# Patient Record
Sex: Female | Born: 1995 | Race: Black or African American | Hispanic: No | Marital: Single | Smoking: Never smoker
Health system: Southern US, Community
[De-identification: ages and names within clinical notes are randomized; demographics above are authoritative.]

## PROBLEM LIST (undated history)

## (undated) ENCOUNTER — Ambulatory Visit (HOSPITAL_COMMUNITY): Discharge status: Absent without leave

## (undated) DIAGNOSIS — B009 Herpesviral infection, unspecified: Secondary | ICD-10-CM

---
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
## 2017-03-17 DIAGNOSIS — N898 Other specified noninflammatory disorders of vagina: Secondary | ICD-10-CM | POA: Diagnosis not present

## 2017-08-05 DIAGNOSIS — N912 Amenorrhea, unspecified: Secondary | ICD-10-CM | POA: Diagnosis not present

## 2017-08-05 DIAGNOSIS — R1084 Generalized abdominal pain: Secondary | ICD-10-CM | POA: Diagnosis not present

## 2017-08-11 DIAGNOSIS — Z0489 Encounter for examination and observation for other specified reasons: Secondary | ICD-10-CM | POA: Diagnosis not present

## 2017-08-11 DIAGNOSIS — A048 Other specified bacterial intestinal infections: Secondary | ICD-10-CM | POA: Diagnosis not present

## 2017-08-11 DIAGNOSIS — R1084 Generalized abdominal pain: Secondary | ICD-10-CM | POA: Diagnosis not present

## 2017-08-12 ENCOUNTER — Other Ambulatory Visit: Payer: Self-pay

## 2017-08-12 ENCOUNTER — Encounter (HOSPITAL_COMMUNITY): Payer: Self-pay | Admitting: Emergency Medicine

## 2017-08-12 DIAGNOSIS — Z5321 Procedure and treatment not carried out due to patient leaving prior to being seen by health care provider: Secondary | ICD-10-CM | POA: Insufficient documentation

## 2017-08-12 DIAGNOSIS — K5900 Constipation, unspecified: Secondary | ICD-10-CM | POA: Insufficient documentation

## 2017-08-12 DIAGNOSIS — R109 Unspecified abdominal pain: Secondary | ICD-10-CM | POA: Insufficient documentation

## 2017-08-12 LAB — CBC
HEMATOCRIT: 34.8 % — AB (ref 36.0–46.0)
HEMOGLOBIN: 11.8 g/dL — AB (ref 12.0–15.0)
MCH: 30.4 pg (ref 26.0–34.0)
MCHC: 33.9 g/dL (ref 30.0–36.0)
MCV: 89.7 fL (ref 78.0–100.0)
Platelets: 302 10*3/uL (ref 150–400)
RBC: 3.88 MIL/uL (ref 3.87–5.11)
RDW: 12 % (ref 11.5–15.5)
WBC: 6.8 10*3/uL (ref 4.0–10.5)

## 2017-08-12 LAB — URINALYSIS, ROUTINE W REFLEX MICROSCOPIC
BACTERIA UA: NONE SEEN
Bilirubin Urine: NEGATIVE
GLUCOSE, UA: NEGATIVE mg/dL
Hgb urine dipstick: NEGATIVE
KETONES UR: NEGATIVE mg/dL
Nitrite: NEGATIVE
PROTEIN: NEGATIVE mg/dL
Specific Gravity, Urine: 1.028 (ref 1.005–1.030)
pH: 5 (ref 5.0–8.0)

## 2017-08-12 LAB — COMPREHENSIVE METABOLIC PANEL
ALBUMIN: 3.2 g/dL — AB (ref 3.5–5.0)
ALT: 41 U/L (ref 14–54)
AST: 25 U/L (ref 15–41)
Alkaline Phosphatase: 98 U/L (ref 38–126)
Anion gap: 10 (ref 5–15)
BUN: 5 mg/dL — ABNORMAL LOW (ref 6–20)
CHLORIDE: 105 mmol/L (ref 101–111)
CO2: 24 mmol/L (ref 22–32)
Calcium: 8.8 mg/dL — ABNORMAL LOW (ref 8.9–10.3)
Creatinine, Ser: 0.75 mg/dL (ref 0.44–1.00)
GFR calc Af Amer: >60 mL/min (ref >60)
GFR calc non Af Amer: >60 mL/min (ref >60)
GLUCOSE: 105 mg/dL — AB (ref 65–99)
POTASSIUM: 3.6 mmol/L (ref 3.5–5.1)
SODIUM: 139 mmol/L (ref 135–145)
Total Bilirubin: 0.3 mg/dL (ref 0.3–1.2)
Total Protein: 6.9 g/dL (ref 6.5–8.1)

## 2017-08-12 LAB — LIPASE, BLOOD: LIPASE: 28 U/L (ref 11–51)

## 2017-08-12 LAB — I-STAT BETA HCG BLOOD, ED (MC, WL, AP ONLY)

## 2017-08-12 NOTE — ED Triage Notes (Signed)
Reports having abdominal pain for the last few days.  Seen at [facility].  Was told she had a lot of gas and constipation.   Seen there Thursday.  Was told they would send scripts for "chest pain".  Patient denies having chest pain.  Reports having a bm this morning that was liquid.  First bm was 2 days ago.

## 2017-08-13 ENCOUNTER — Emergency Department (HOSPITAL_COMMUNITY)
Admission: EM | Admit: 2017-08-13 | Discharge: 2017-08-13 | Discharge status: Against Medical Advice | Payer: Self-pay | Attending: Emergency Medicine | Admitting: Emergency Medicine

## 2017-08-13 NOTE — ED Notes (Signed)
Unable to locate pt in waiting room.

## 2017-08-13 NOTE — ED Notes (Signed)
No answer in waiting room 

## 2017-09-24 ENCOUNTER — Ambulatory Visit (HOSPITAL_COMMUNITY)
Admission: EM | Admit: 2017-09-24 | Discharge: 2017-09-24 | Disposition: A | Discharge status: Home / Self Care | Payer: 59 | Attending: Family Medicine | Admitting: Family Medicine

## 2017-09-24 ENCOUNTER — Encounter (HOSPITAL_COMMUNITY): Payer: Self-pay | Admitting: Family Medicine

## 2017-09-24 DIAGNOSIS — R0981 Nasal congestion: Secondary | ICD-10-CM | POA: Diagnosis present

## 2017-09-24 DIAGNOSIS — R69 Illness, unspecified: Secondary | ICD-10-CM

## 2017-09-24 DIAGNOSIS — R05 Cough: Secondary | ICD-10-CM | POA: Diagnosis present

## 2017-09-24 DIAGNOSIS — J111 Influenza due to unidentified influenza virus with other respiratory manifestations: Secondary | ICD-10-CM | POA: Diagnosis not present

## 2017-09-24 LAB — POCT RAPID STREP A: Streptococcus, Group A Screen (Direct): NEGATIVE

## 2017-09-24 MED ORDER — ONDANSETRON 4 MG PO TBDP: 4.0000 mg | ORAL_TABLET | Freq: Three times a day (TID) | ORAL | 0 refills | Status: DC | PRN

## 2017-09-24 MED ORDER — PSEUDOEPH-BROMPHEN-DM 30-2-10 MG/5ML PO SYRP: 5.0000 mL | ORAL_SOLUTION | Freq: Four times a day (QID) | ORAL | 0 refills | Status: DC | PRN

## 2017-09-24 MED ORDER — CETIRIZINE HCL 10 MG PO CAPS: 10.0000 mg | ORAL_CAPSULE | Freq: Every day | ORAL | 0 refills | Status: DC

## 2017-09-24 MED ORDER — FLUTICASONE PROPIONATE 50 MCG/ACT NA SUSP: 1.0000 | Freq: Every day | NASAL | 0 refills | Status: DC

## 2017-09-24 NOTE — ED Triage Notes (Signed)
Pt here for cough, congestion, fever, and body aches since yesterday.

## 2017-09-24 NOTE — Discharge Instructions (Addendum)
For congestion please begin daily Zyrtec, Flonase nasal spray.  I have sent these in, but please compare to over-the-counter prices.   For cough you may continue Robitussin, over-the-counter Delsym or the cough syrup provided.  For sore throat you may use Cepacol lozenges, salt water gargles, Chloraseptic spray, honey and tea  He may use Zofran as needed for any nausea or vomiting.  Continue Tylenol and ibuprofen for fever, headache.  I expect symptoms to gradually resolve over the next week

## 2017-09-24 NOTE — ED Provider Notes (Signed)
[facility]    CSN: [id number] Arrival date & time: 09/24/17  1306     History   Chief Complaint Chief Complaint  Patient presents with  . Cough  . Nasal Congestion    HPI Jane Martin is a 22 y.o. female Patient is presenting with URI symptoms- congestion, cough, sore throat.  Patient also noting subjective fevers, body aches and weakness, headaches for 2 days.  Patient's main complaints are cough. Symptoms have been going on for 2 days. Patient has tried ibuprofen, Mucinex, Robitussin, with minimal relief. Denies fever, nausea, vomiting, diarrhea. Denies shortness of breath and chest pain.  Decreased appetite, tolerating oral intake.  Believes she had a flu exposure at work.   HPI  History reviewed. No pertinent past medical history.  There are no active problems to display for this patient.   History reviewed. No pertinent surgical history.  OB History   None      Home Medications    Prior to Admission medications   Medication Sig Start Date End Date Taking? Authorizing Provider  brompheniramine-pseudoephedrine-DM 30-2-10 MG/5ML syrup Take 5 mLs by mouth 4 (four) times daily as needed. 09/24/17   Mayuri Staples C, PA-C  Cetirizine HCl 10 MG CAPS Take 1 capsule (10 mg total) by mouth daily for 10 days. 09/24/17 10/04/17  Shernell Saldierna C, PA-C  fluticasone (FLONASE) 50 MCG/ACT nasal spray Place 1-2 sprays into both nostrils daily. 09/24/17   Rc Amison C, PA-C  ondansetron (ZOFRAN ODT) 4 MG disintegrating tablet Take 1 tablet (4 mg total) by mouth every 8 (eight) hours as needed for nausea or vomiting. 09/24/17   Letti Towell, Junius Creamer, PA-C    Family History History reviewed. No pertinent family history.  Social History Social History   Tobacco Use  . Smoking status: Never Smoker  . Smokeless tobacco: Never Used  Substance Use Topics  . Alcohol use: No    Frequency: Never  . Drug use: Yes    Types: Marijuana     Allergies   Patient has no  known allergies.   Review of Systems Review of Systems  Constitutional: Positive for appetite change, chills and fever. Negative for fatigue.  HENT: Positive for congestion, rhinorrhea and sore throat. Negative for ear pain, sinus pressure and trouble swallowing.   Respiratory: Positive for cough. Negative for chest tightness and shortness of breath.   Cardiovascular: Negative for chest pain.  Gastrointestinal: Negative for abdominal pain, nausea and vomiting.  Musculoskeletal: Positive for myalgias.  Skin: Negative for rash.  Neurological: Positive for weakness and headaches. Negative for dizziness and light-headedness.     Physical Exam Triage Vital Signs ED Triage Vitals  Enc Vitals Group     BP 09/24/17 1349 117/76     Pulse Rate 09/24/17 1349 97     Resp 09/24/17 1349 18     Temp 09/24/17 1349 98.5 F (36.9 C)     Temp Source 09/24/17 1350 Oral     SpO2 09/24/17 1349 97 %     Weight --      Height --      Head Circumference --      Peak Flow --      Pain Score 09/24/17 1347 4     Pain Loc --      Pain Edu? --      Excl. in GC? --    No data found.  Updated Vital Signs BP 117/76   Pulse 97   Temp 99.1 F (37.3  C) (Oral)   Resp 18   LMP 09/06/2017   SpO2 97%   Visual Acuity Right Eye Distance:   Left Eye Distance:   Bilateral Distance:    Right Eye Near:   Left Eye Near:    Bilateral Near:     Physical Exam  Constitutional: She appears well-developed and well-nourished. No distress.  HENT:  Head: Normocephalic and atraumatic.  Bilateral TMs not erythematous, nasal mucosa erythematous with swollen turbinates, clear rhinorrhea present.  Posterior oropharynx mildly erythematous, no tonsillar enlargement or exudate.  Eyes: Conjunctivae are normal.  Neck: Neck supple.  Cardiovascular: Normal rate and regular rhythm.  No murmur heard. Pulmonary/Chest: Effort normal and breath sounds normal. No respiratory distress.  Breathing comfortably at rest, CTA BL,  no adventitious sounds appreciated  Abdominal: Soft. There is no tenderness.  Musculoskeletal: She exhibits no edema.  Neurological: She is alert.  Skin: Skin is warm and dry.  Psychiatric: She has a normal mood and affect.  Nursing note and vitals reviewed.    UC Treatments / Results  Labs (all labs ordered are listed, but only abnormal results are displayed) Labs Reviewed  CULTURE, GROUP A STREP [facility])  POCT RAPID STREP A    EKG None Radiology No results found.  Procedures Procedures (including critical care time)  Medications Ordered in UC Medications - No data to display   Initial Impression / Assessment and Plan / UC Course  I have reviewed the triage vital signs and the nursing notes.  Pertinent labs & imaging results that were available during my care of the patient were reviewed by me and considered in my medical decision making (see chart for details).     Patient with viral URI versus influenza-like illness.  Will recommend symptomatic treatment.  Vital signs stable, patient is nontoxic and in no acute distress. Discussed strict return precautions. Patient verbalized understanding and is agreeable with plan.   Final Clinical Impressions(s) / UC Diagnoses   Final diagnoses:  Influenza-like illness    ED Discharge Orders        Ordered    Cetirizine HCl 10 MG CAPS  Daily     09/24/17 1416    fluticasone (FLONASE) 50 MCG/ACT nasal spray  Daily     09/24/17 1416    brompheniramine-pseudoephedrine-DM 30-2-10 MG/5ML syrup  4 times daily PRN     09/24/17 1416    ondansetron (ZOFRAN ODT) 4 MG disintegrating tablet  Every 8 hours PRN     09/24/17 1418       Controlled Substance Prescriptions [place] Controlled Substance Registry consulted? Not Applicable   Lew DawesWieters, Zeba Luby C, [place]PA-C 09/24/17 1538

## 2017-09-27 LAB — CULTURE, GROUP A STREP (THRC)

## 2018-02-03 DIAGNOSIS — Z113 Encounter for screening for infections with a predominantly sexual mode of transmission: Secondary | ICD-10-CM | POA: Diagnosis not present

## 2018-02-03 DIAGNOSIS — Z6825 Body mass index (BMI) 25.0-25.9, adult: Secondary | ICD-10-CM | POA: Diagnosis not present

## 2018-02-03 DIAGNOSIS — Z118 Encounter for screening for other infectious and parasitic diseases: Secondary | ICD-10-CM | POA: Diagnosis not present

## 2018-02-03 DIAGNOSIS — Z114 Encounter for screening for human immunodeficiency virus [HIV]: Secondary | ICD-10-CM | POA: Diagnosis not present

## 2018-02-03 DIAGNOSIS — N760 Acute vaginitis: Secondary | ICD-10-CM | POA: Diagnosis not present

## 2018-02-03 DIAGNOSIS — Z1159 Encounter for screening for other viral diseases: Secondary | ICD-10-CM | POA: Diagnosis not present

## 2018-02-03 DIAGNOSIS — Z01419 Encounter for gynecological examination (general) (routine) without abnormal findings: Secondary | ICD-10-CM | POA: Diagnosis not present

## 2018-03-15 DIAGNOSIS — N898 Other specified noninflammatory disorders of vagina: Secondary | ICD-10-CM | POA: Diagnosis not present

## 2018-03-15 DIAGNOSIS — Z202 Contact with and (suspected) exposure to infections with a predominantly sexual mode of transmission: Secondary | ICD-10-CM | POA: Diagnosis not present

## 2018-03-15 DIAGNOSIS — N760 Acute vaginitis: Secondary | ICD-10-CM | POA: Diagnosis not present

## 2018-05-25 DIAGNOSIS — Z118 Encounter for screening for other infectious and parasitic diseases: Secondary | ICD-10-CM | POA: Diagnosis not present

## 2018-05-25 DIAGNOSIS — N760 Acute vaginitis: Secondary | ICD-10-CM | POA: Diagnosis not present

## 2018-08-04 DIAGNOSIS — N760 Acute vaginitis: Secondary | ICD-10-CM | POA: Diagnosis not present

## 2018-08-04 DIAGNOSIS — Z113 Encounter for screening for infections with a predominantly sexual mode of transmission: Secondary | ICD-10-CM | POA: Diagnosis not present

## 2018-08-04 DIAGNOSIS — Z118 Encounter for screening for other infectious and parasitic diseases: Secondary | ICD-10-CM | POA: Diagnosis not present

## 2018-08-04 DIAGNOSIS — Z202 Contact with and (suspected) exposure to infections with a predominantly sexual mode of transmission: Secondary | ICD-10-CM | POA: Diagnosis not present

## 2018-08-04 DIAGNOSIS — Z1159 Encounter for screening for other viral diseases: Secondary | ICD-10-CM | POA: Diagnosis not present

## 2018-08-04 DIAGNOSIS — Z114 Encounter for screening for human immunodeficiency virus [HIV]: Secondary | ICD-10-CM | POA: Diagnosis not present

## 2018-09-05 ENCOUNTER — Ambulatory Visit (HOSPITAL_COMMUNITY)
Admission: EM | Admit: 2018-09-05 | Discharge: 2018-09-05 | Disposition: A | Discharge status: Home / Self Care | Payer: 59 | Attending: Family Medicine | Admitting: Family Medicine

## 2018-09-05 ENCOUNTER — Ambulatory Visit: Payer: Self-pay | Admitting: Nurse Practitioner

## 2018-09-05 ENCOUNTER — Encounter (HOSPITAL_COMMUNITY): Payer: Self-pay | Admitting: Emergency Medicine

## 2018-09-05 VITALS — BP 100/65 | HR 90 | Temp 98.6°F | Resp 16 | Wt 155.2 lb

## 2018-09-05 DIAGNOSIS — R109 Unspecified abdominal pain: Secondary | ICD-10-CM

## 2018-09-05 DIAGNOSIS — K529 Noninfective gastroenteritis and colitis, unspecified: Secondary | ICD-10-CM | POA: Diagnosis not present

## 2018-09-05 DIAGNOSIS — R10819 Abdominal tenderness, unspecified site: Secondary | ICD-10-CM

## 2018-09-05 LAB — POCT URINALYSIS DIPSTICK
BILIRUBIN UA: NEGATIVE
GLUCOSE UA: NEGATIVE
Ketones, UA: NEGATIVE
Leukocytes, UA: NEGATIVE
Nitrite, UA: NEGATIVE
Protein, UA: NEGATIVE
RBC UA: NEGATIVE
SPEC GRAV UA: 1.01 (ref 1.010–1.025)
Urobilinogen, UA: 0.2 E.U./dL
pH, UA: 6 (ref 5.0–8.0)

## 2018-09-05 LAB — POCT URINE PREGNANCY: Preg Test, Ur: NEGATIVE

## 2018-09-05 MED ORDER — ONDANSETRON HCL 4 MG PO TABS: 4.0000 mg | ORAL_TABLET | Freq: Four times a day (QID) | ORAL | 0 refills | Status: DC

## 2018-09-05 NOTE — Progress Notes (Signed)
History of Present Illness   Patient Identification Jane Martin is a 23 y.o. female.  Chief Complaint  Nausea (X 1 DAY ); Diarrhea (X 1 DAY); Abdominal Cramping (STARTED TODAY ); and Headache (1 DAY )   Patient presents for evaluation of diarrhea. Onset of symptoms was 1 day ago, and has been unchanged since that time. Patient reports having stools 5-7 since the onset. The stool consistency is described as mucous-laden and watery; the color is brown, and the volume is alternating small and large.  Other symptoms include anorexia, nausea, vomiting 1 times over last 1 day. Emesis was described as digested food, headache.  Risk factors for diarrhea include known or suspected contact with infectious person (at the end of last week).  Previous evaluation includes none.  Treatment to date includes none. Other acquaintances affected - patient and none. There is no prior history of gastrointestinal disease and no known risk factors for parasitic or bacterial infection. Patient denies chance of pregnancy, her LMP was January 9 through 16, patient is on OCP.  No past medical history on file.    Current Outpatient Medications:  .  brompheniramine-pseudoephedrine-DM 30-2-10 MG/5ML syrup, Take 5 mLs by mouth 4 (four) times daily as needed. (Patient not taking: Reported on 09/05/2018), Disp: 120 mL, Rfl: 0 .  Cetirizine HCl 10 MG CAPS, Take 1 capsule (10 mg total) by mouth daily for 10 days., Disp: 10 capsule, Rfl: 0 .  fluticasone (FLONASE) 50 MCG/ACT nasal spray, Place 1-2 sprays into both nostrils daily. (Patient not taking: Reported on 09/05/2018), Disp: 1 g, Rfl: 0 .  ondansetron (ZOFRAN ODT) 4 MG disintegrating tablet, Take 1 tablet (4 mg total) by mouth every 8 (eight) hours as needed for nausea or vomiting. (Patient not taking: Reported on 09/05/2018), Disp: 20 tablet, Rfl: 0   Review of Systems Constitutional: positive for anorexia and fatigue, negative for chills, malaise, sweats and weight  loss Eyes: negative Ears, nose, mouth, throat, and face: positive for nasal congestion, negative for ear drainage, earaches and sore throat Respiratory: negative Cardiovascular: negative Gastrointestinal: positive for abdominal pain, diarrhea, nausea and vomiting, negative for constipation, odynophagia and reflux symptoms Neurological: positive for headaches, negative for coordination problems, dizziness, gait problems, paresthesia, tremors, vertigo and weakness   Physical Exam   BP 100/65 (BP Location: Right Arm, Patient Position: Sitting, Cuff Size: Normal)   Pulse 90   Temp 98.6 F (37 C) (Oral)   Resp 16   Wt 155 lb 3.2 oz (70.4 kg)   SpO2 97%   BMI 27.49 kg/m  Physical Exam Vitals signs reviewed.  Constitutional:      General: She is not in acute distress. HENT:     Head: Normocephalic.     Mouth/Throat:     Mouth: Mucous membranes are moist.  Neck:     Musculoskeletal: Normal range of motion and neck supple.  Cardiovascular:     Rate and Rhythm: Normal rate and regular rhythm.     Heart sounds: Normal heart sounds.  Pulmonary:     Effort: Pulmonary effort is normal. No respiratory distress.     Breath sounds: Normal breath sounds. No stridor. No wheezing, rhonchi or rales.  Abdominal:     General: Bowel sounds are normal.     Palpations: Abdomen is soft.     Tenderness: There is generalized abdominal tenderness (diffuse abdominal tenderness in all quadrants, increased in RLQ) and tenderness in the right upper quadrant. There is guarding and rebound. There is no  right CVA tenderness or left CVA tenderness.  Lymphadenopathy:     Cervical: No cervical adenopathy.  Skin:    General: Skin is warm and dry.     Capillary Refill: Capillary refill takes less than 2 seconds.  Neurological:     Mental Status: She is alert and oriented to person, place, and time.     Cranial Nerves: No cranial nerve deficit.     Motor: No weakness.     Coordination: Coordination normal.   Psychiatric:        Mood and Affect: Mood normal.        Speech: Speech normal.    Results for orders placed or performed in visit on 09/05/18 (from the past 24 hour(s))  POCT urinalysis dipstick     Status: Normal   Collection Time: 09/05/18 10:41 AM  Result Value Ref Range   Color, UA YELLOW    Clarity, UA CLEAR    Glucose, UA Negative Negative   Bilirubin, UA NEGATIVE    Ketones, UA NEGATIVE    Spec Grav, UA 1.010 1.010 - 1.025   Blood, UA NEGATIVE    pH, UA 6.0 5.0 - 8.0   Protein, UA Negative Negative   Urobilinogen, UA 0.2 0.2 or 1.0 E.U./dL   Nitrite, UA NEGATIVE    Leukocytes, UA Negative Negative   Appearance     Odor    POCT urine pregnancy     Status: Normal   Collection Time: 09/05/18 10:55 AM  Result Value Ref Range   Preg Test, Ur Negative Negative    Assessment and Plan:   Exam findings, diagnosis etiology and medication use and indications reviewed with patient. Follow- Up and discharge instructions provided.  Based on the patient's physical assessment, and the findings of abdominal tenderness, I am recommending this patient go to the local urgent care emergency department for further work-up to rule out other abdominal conditions such as appendicitis.  Patient verbalized understanding of information provided and agrees with plan of care (POC), all questions answered. The patient is advised to call or return to clinic if condition does not see an improvement in symptoms, or to seek the care of the closest emergency department if condition worsens with the above plan.   1. Abdominal cramps  - POCT urinalysis dipstick - POCT urine pregnancy  2. Abdominal tenderness, rebound tenderness presence not specified, unspecified location

## 2018-09-05 NOTE — Patient Instructions (Addendum)
Abdominal Pain, Adult -Due to your abdominal tenderness found on exam, I would like you to go a local urgent care or to the ER for further workup and evaluation.    Abdominal pain can be caused by many things. Often, abdominal pain is not serious and it gets better with no treatment or by being treated at home. However, sometimes abdominal pain is serious. Your health care provider will do a medical history and a physical exam to try to determine the cause of your abdominal pain. Follow these instructions at home:  Take over-the-counter and prescription medicines only as told by your health care provider. Do not take a laxative unless told by your health care provider.  Drink enough fluid to keep your urine clear or pale yellow.  Watch your condition for any changes.  Keep all follow-up visits as told by your health care provider. This is important. Contact a health care provider if:  Your abdominal pain changes or gets worse.  You are not hungry or you lose weight without trying.  You are constipated or have diarrhea for more than 2-3 days.  You have pain when you urinate or have a bowel movement.  Your abdominal pain wakes you up at night.  Your pain gets worse with meals, after eating, or with certain foods.  You are throwing up and cannot keep anything down.  You have a fever. Get help right away if:  Your pain does not go away as soon as your health care provider told you to expect.  You cannot stop throwing up.  Your pain is only in areas of the abdomen, such as the right side or the left lower portion of the abdomen.  You have bloody or black stools, or stools that look like tar.  You have severe pain, cramping, or bloating in your abdomen.  You have signs of dehydration, such as: ? Dark urine, very little urine, or no urine. ? Cracked lips. ? Dry mouth. ? Sunken eyes. ? Sleepiness. ? Weakness. This information is not intended to replace advice given to you by  your health care provider. Make sure you discuss any questions you have with your health care provider. Document Released: 03/31/2005 Document Revised: 01/09/2016 Document Reviewed: 12/03/2015 Elsevier Interactive Patient Education  2019 [employer].

## 2018-09-05 NOTE — Discharge Instructions (Addendum)
Drink lots of fluids Take Zofran for any nausea and vomiting Take Imodium if needed for diarrhea Advance to bland diet Avoid spicy food and fried foods for a good week or 2

## 2018-09-05 NOTE — ED Provider Notes (Signed)
[facility]    CSN: [id number] Arrival date & time: 09/05/18  1141     History   Chief Complaint Chief Complaint  Patient presents with  . Emesis  . Diarrhea    HPI Jane Martin is a 23 y.o. female.   HPI  Patient states that she was exposed to gastroenteritis at work.  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  This patient started having symptoms 2 days ago.  Nausea vomiting diarrhea and abdominal crampy pain.  Has not vomited since yesterday.  Still very nauseated.  Taking sips of fluids.  Still with lots of diarrhea.  No recent travel.  No recent antibiotics.  She does not eat any foods that she thinks but not agreed with her.  History reviewed. No pertinent past medical history.  There are no active problems to display for this patient.   History reviewed. No pertinent surgical history.  OB History   No obstetric history on file.      Home Medications    Prior to Admission medications   Medication Sig Start Date End Date Taking? Authorizing Provider  Cetirizine HCl 10 MG CAPS Take 1 capsule (10 mg total) by mouth daily for 10 days. 09/24/17 10/04/17  Wieters, Hallie C, PA-C  ondansetron (ZOFRAN) 4 MG tablet Take 1 tablet (4 mg total) by mouth every 6 (six) hours. 09/05/18   Eustace Moore, MD    Family History No family history on file.  Social History Social History   Tobacco Use  . Smoking status: Never Smoker  . Smokeless tobacco: Never Used  Substance Use Topics  . Alcohol use: No    Frequency: Never  . Drug use: Yes    Types: Marijuana     Allergies   Patient has no known allergies.   Review of Systems Review of Systems  Constitutional: Negative for chills and fever.  HENT: Negative for ear pain and sore throat.   Eyes: Negative for pain and visual disturbance.  Respiratory: Negative for cough and shortness of breath.   Cardiovascular: Negative for chest pain and palpitations.    Gastrointestinal: Positive for abdominal pain, diarrhea, nausea and vomiting.  Genitourinary: Negative for dysuria and hematuria.  Musculoskeletal: Negative for arthralgias and back pain.  Skin: Negative for color change and rash.  Neurological: Negative for seizures and syncope.  All other systems reviewed and are negative.    Physical Exam Triage Vital Signs ED Triage Vitals  Enc Vitals Group     BP 09/05/18 1229 117/71     Pulse Rate 09/05/18 1229 73     Resp 09/05/18 1229 18     Temp 09/05/18 1229 98.4 F (36.9 C)     Temp src --      SpO2 09/05/18 1229 100 %     Weight --      Height --      Head Circumference --      Peak Flow --      Pain Score 09/05/18 1231 0     Pain Loc --      Pain Edu? --      Excl. in GC? --    No data found.  Updated Vital Signs BP 117/71   Pulse 73   Temp 98.4 F (36.9 C)   Resp 18   SpO2 100%     Physical Exam Constitutional:      General: She is not in acute distress.    Appearance:  Normal appearance. She is well-developed and normal weight.     Comments: No acute distress  HENT:     Head: Normocephalic and atraumatic.     Right Ear: Tympanic membrane and ear canal normal.     Left Ear: Tympanic membrane and ear canal normal.     Mouth/Throat:     Mouth: Mucous membranes are moist.     Pharynx: No posterior oropharyngeal erythema.  Eyes:     Conjunctiva/sclera: Conjunctivae normal.     Pupils: Pupils are equal, round, and reactive to light.  Neck:     Musculoskeletal: Normal range of motion.  Cardiovascular:     Rate and Rhythm: Normal rate and regular rhythm.     Heart sounds: Normal heart sounds.  Pulmonary:     Effort: Pulmonary effort is normal. No respiratory distress.     Breath sounds: Normal breath sounds.  Abdominal:     General: Abdomen is flat. There is no distension.     Palpations: Abdomen is soft.     Tenderness: There is no abdominal tenderness.  Musculoskeletal: Normal range of motion.  Skin:     General: Skin is warm and dry.  Neurological:     General: No focal deficit present.     Mental Status: She is alert.  Psychiatric:        Mood and Affect: Mood normal.        Behavior: Behavior normal.      UC Treatments / Results  Labs (all labs ordered are listed, but only abnormal results are displayed) Labs Reviewed - No data to display  EKG None  Radiology No results found.  Procedures Procedures (including critical care time)  Medications Ordered in UC Medications - No data to display  Initial Impression / Assessment and Plan / UC Course  I have reviewed the triage vital signs and the nursing notes.  Pertinent labs & imaging results that were available during my care of the patient were reviewed by me and considered in my medical decision making (see chart for details).     Likely viral gastroenteritis.  Discussed recovery.  Advance to regular diet. Final Clinical Impressions(s) / UC Diagnoses   Final diagnoses:  Gastroenteritis     Discharge Instructions     Drink lots of fluids Take Zofran for any nausea and vomiting Take Imodium if needed for diarrhea Advance to bland diet Avoid spicy food and fried foods for a good week or 2   ED Prescriptions    Medication Sig Dispense Auth. Provider   ondansetron (ZOFRAN) 4 MG tablet Take 1 tablet (4 mg total) by mouth every 6 (six) hours. 12 tablet Eustace Moore, MD     Controlled Substance Prescriptions [place] Controlled Substance Registry consulted? Not Applicable   Eustace Moore, MD 09/05/18 [phone]

## 2018-09-05 NOTE — ED Triage Notes (Signed)
Pt states yesterday she was at work and she threw up, states she took a pregnancy test and it was negative. She states she went home from work and had diarrhea. States today shes still having diarrhea today. Pt also states shes congested with cough. No vomiting today.

## 2018-09-07 ENCOUNTER — Telehealth: Payer: Self-pay

## 2018-09-07 NOTE — Telephone Encounter (Signed)
Tried calling pt to see how she was feeling since her visit with [place], however there was no answer and pt has a voicemail that has not been setup.

## 2018-11-01 ENCOUNTER — Ambulatory Visit (HOSPITAL_COMMUNITY)
Admission: EM | Admit: 2018-11-01 | Discharge: 2018-11-01 | Disposition: A | Discharge status: Home / Self Care | Payer: PRIVATE HEALTH INSURANCE | Attending: Family Medicine | Admitting: Family Medicine

## 2018-11-01 ENCOUNTER — Encounter (HOSPITAL_COMMUNITY): Payer: Self-pay

## 2018-11-01 ENCOUNTER — Other Ambulatory Visit: Payer: Self-pay

## 2018-11-01 DIAGNOSIS — N898 Other specified noninflammatory disorders of vagina: Secondary | ICD-10-CM | POA: Diagnosis not present

## 2018-11-01 LAB — POCT URINALYSIS DIP (DEVICE)
Bilirubin Urine: NEGATIVE
Glucose, UA: NEGATIVE mg/dL
Hgb urine dipstick: NEGATIVE
Ketones, ur: NEGATIVE mg/dL
Nitrite: NEGATIVE
Protein, ur: NEGATIVE mg/dL
Specific Gravity, Urine: 1.025 (ref 1.005–1.030)
Urobilinogen, UA: 1 mg/dL (ref 0.0–1.0)
pH: 7 (ref 5.0–8.0)

## 2018-11-01 MED ORDER — FLUCONAZOLE 200 MG PO TABS: ORAL_TABLET | ORAL | 0 refills | Status: DC

## 2018-11-01 NOTE — ED Notes (Signed)
Patient verbalizes understanding of discharge instructions. Opportunity for questioning and answers were provided. Patient discharged from [facility] by RN.  

## 2018-11-01 NOTE — ED Triage Notes (Signed)
Patient presents to Urgent Care with complaints of  Vaginal itching and irritation since 3 days ago. Patient states she also has frequent and  Burning urination.

## 2018-11-01 NOTE — ED Provider Notes (Signed)
[facility]   [id number] 11/01/18 Arrival Time: 1615   HY:[id number] itching and irritation  SUBJECTIVE:  Jane Martin is a 23 y.o. female who presents with complaints of vaginal itching and irritation that began 1 day ago.  Does admit to a change in bath products, to scented products.  Also mentions last sexual activity 2 days ago.  Patient has been sexually active with 1 female partner.  She has NOT tried OTC medications or creams.  Also mentions increased urinary frequency and discomfort with urination.  She denies fever, chills, nausea, vomiting, abdominal or pelvic pain, vaginal discharge, vaginal odor, vaginal bleeding, dyspareunia, vaginal rashes or lesions.   No LMP recorded. Patient has had an implant. Current birth control method: Nexplanon  ROS: As per HPI.  History reviewed. No pertinent past medical history. History reviewed. No pertinent surgical history. No Known Allergies No current facility-administered medications on file prior to encounter.    No current outpatient medications on file prior to encounter.    Social History   Socioeconomic History  . Marital status: Single    Spouse name: Not on file  . Number of children: Not on file  . Years of education: Not on file  . Highest education level: Not on file  Occupational History  . Not on file  Social Needs  . Financial resource strain: Not on file  . Food insecurity:    Worry: Not on file    Inability: Not on file  . Transportation needs:    Medical: Not on file    Non-medical: Not on file  Tobacco Use  . Smoking status: Never Smoker  . Smokeless tobacco: Never Used  Substance and Sexual Activity  . Alcohol use: No    Frequency: Never  . Drug use: Yes    Types: Marijuana  . Sexual activity: Not on file  Lifestyle  . Physical activity:    Days per week: Not on file    Minutes per session: Not on file  . Stress: Not on file  Relationships  . Social connections:    Talks on phone:  Not on file    Gets together: Not on file    Attends religious service: Not on file    Active member of club or organization: Not on file    Attends meetings of clubs or organizations: Not on file    Relationship status: Not on file  . Intimate partner violence:    Fear of current or ex partner: Not on file    Emotionally abused: Not on file    Physically abused: Not on file    Forced sexual activity: Not on file  Other Topics Concern  . Not on file  Social History Narrative  . Not on file   History reviewed. No pertinent family history.  OBJECTIVE:  Vitals:   11/01/18 1701  BP: 115/73  Pulse: 72  Temp: 98.5 F (36.9 C)  TempSrc: Oral  SpO2: 100%     General appearance: Alert, NAD, appears stated age Head: NCAT Throat: lips, mucosa, and tongue normal; teeth and gums normal Lungs: CTA bilaterally without adventitious breath sounds Heart: regular rate and rhythm.  Radial pulses 2+ symmetrical bilaterally Back: no CVA tenderness Abdomen: soft, non-tender; bowel sounds normal; no guarding GU: deferred Skin: warm and dry Psychological:  Alert and cooperative. Normal mood and affect.  LABS:  Results for orders placed or performed during the hospital encounter of 11/01/18  POCT urinalysis dip (device)  Result Value Ref  Range   Glucose, UA NEGATIVE NEGATIVE mg/dL   Bilirubin Urine NEGATIVE NEGATIVE   Ketones, ur NEGATIVE NEGATIVE mg/dL   Specific Gravity, Urine 1.025 1.005 - 1.030   Hgb urine dipstick NEGATIVE NEGATIVE   pH 7.0 5.0 - 8.0   Protein, ur NEGATIVE NEGATIVE mg/dL   Urobilinogen, UA 1.0 0.0 - 1.0 mg/dL   Nitrite NEGATIVE NEGATIVE   Leukocytes,Ua SMALL (A) NEGATIVE    Labs Reviewed  POCT URINALYSIS DIP (DEVICE) - Abnormal; Notable for the following components:      Result Value   Leukocytes,Ua SMALL (*)    All other components within normal limits  URINE CULTURE  CERVICOVAGINAL ANCILLARY ONLY    ASSESSMENT & PLAN:  1. Vaginal itching     Meds  ordered this encounter  Medications  . fluconazole (DIFLUCAN) 200 MG tablet    Sig: Take one dose by mouth, wait 72 hours, and then take second dose by mouth    Dispense:  2 tablet    Refill:  0    Order Specific Question:   Supervising Provider    Answer:   Eustace MooreELSON, YVONNE SUE [[id number]]    Pending: Labs Reviewed  POCT URINALYSIS DIP (DEVICE) - Abnormal; Notable for the following components:      Result Value   Leukocytes,Ua SMALL (*)    All other components within normal limits  URINE CULTURE  CERVICOVAGINAL ANCILLARY ONLY   Urine did not show signs of infection.  We will send out to culture and notify you of abnormal results.   Vaginal self-swab obtained Declines HIV/ syphilis testing today Prescribed diflucan 200 mg once daily and then second dose 72 hours later We will follow up with you regarding the results of your test If tests are positive, please abstain from sexual activity for at least 7 days and notify partners Follow up with PCP or with community health if symptoms persists Return here or go to ER if you have any new or worsening symptoms fever, chills, nausea, vomiting, abdominal or pelvic pain, painful intercourse, vaginal discharge, vaginal bleeding, persistent symptoms despite treatment, etc...  Reviewed expectations re: course of current medical issues. Questions answered. Outlined signs and symptoms indicating need for more acute intervention. Patient verbalized understanding. After Visit Summary given.       Rennis HardingWurst, Cassia Fein, PA-C 11/01/18 1702

## 2018-11-01 NOTE — Discharge Instructions (Addendum)
Urine did not show signs of infection.  We will send out to culture and notify you of abnormal results.   Vaginal self-swab obtained Declines HIV/ syphilis testing today Prescribed diflucan 200 mg once daily and then second dose 72 hours later We will follow up with you regarding the results of your test If tests are positive, please abstain from sexual activity for at least 7 days and notify partners Follow up with PCP or with community health if symptoms persists Return here or go to ER if you have any new or worsening symptoms fever, chills, nausea, vomiting, abdominal or pelvic pain, painful intercourse, vaginal discharge, vaginal bleeding, persistent symptoms despite treatment, etc..Marland Kitchen

## 2018-11-02 ENCOUNTER — Telehealth (HOSPITAL_COMMUNITY): Payer: Self-pay | Admitting: Emergency Medicine

## 2018-11-02 LAB — URINE CULTURE: Culture: NO GROWTH

## 2018-11-02 LAB — CERVICOVAGINAL ANCILLARY ONLY
Bacterial vaginitis: POSITIVE — AB
Candida vaginitis: POSITIVE — AB
Chlamydia: NEGATIVE
Neisseria Gonorrhea: NEGATIVE
Trichomonas: NEGATIVE

## 2018-11-02 MED ORDER — METRONIDAZOLE 500 MG PO TABS: 500.0000 mg | ORAL_TABLET | Freq: Two times a day (BID) | ORAL | 0 refills | Status: AC

## 2018-11-02 NOTE — Telephone Encounter (Signed)
Candida (yeast) is positive.  Prescription for fluconazole was given at the urgent care visit.    Bacterial vaginosis is positive. This was not treated at the urgent care visit.  Flagyl 500 mg BID x 7 days #14 no refills sent to patients pharmacy of choice.    Patient contacted and made aware of all results, all questions answered.

## 2019-01-13 ENCOUNTER — Ambulatory Visit (HOSPITAL_COMMUNITY)
Admission: EM | Admit: 2019-01-13 | Discharge: 2019-01-13 | Disposition: A | Discharge status: Home / Self Care | Payer: 59 | Attending: Family Medicine | Admitting: Family Medicine

## 2019-01-13 ENCOUNTER — Other Ambulatory Visit: Payer: Self-pay

## 2019-01-13 ENCOUNTER — Encounter (HOSPITAL_COMMUNITY): Payer: Self-pay | Admitting: Emergency Medicine

## 2019-01-13 DIAGNOSIS — N760 Acute vaginitis: Secondary | ICD-10-CM | POA: Diagnosis not present

## 2019-01-13 LAB — POCT URINALYSIS DIP (DEVICE)
Bilirubin Urine: NEGATIVE
Glucose, UA: NEGATIVE mg/dL
Hgb urine dipstick: NEGATIVE
Ketones, ur: NEGATIVE mg/dL
Nitrite: NEGATIVE
Protein, ur: NEGATIVE mg/dL
Specific Gravity, Urine: >=1.03 (ref 1.005–1.030)
Urobilinogen, UA: 0.2 mg/dL (ref 0.0–1.0)
pH: 6 (ref 5.0–8.0)

## 2019-01-13 LAB — POCT PREGNANCY, URINE: Preg Test, Ur: NEGATIVE

## 2019-01-13 MED ORDER — METRONIDAZOLE 500 MG PO TABS: 500.0000 mg | ORAL_TABLET | Freq: Two times a day (BID) | ORAL | 0 refills | Status: DC

## 2019-01-13 MED ORDER — FLUCONAZOLE 200 MG PO TABS: ORAL_TABLET | ORAL | 0 refills | Status: DC

## 2019-01-13 NOTE — ED Triage Notes (Signed)
Vaginal irritation noticed 3 days ago.  Denies discharge  Started taking old script for metronidazole.  Started taking this on Thursday and taking 2 times a day has not noticed any change. Discard date 10/2019

## 2019-01-13 NOTE — ED Provider Notes (Signed)
[place]    CSN: [id number] Arrival date & time: 01/13/19  1005      History   Chief Complaint Chief Complaint  Patient presents with  . Vaginal Itching    HPI Jane Martin is a 23 y.o. female.   HPI Jane Martin is present via today's encounter for evaluation of vaginitis. She is sexually active with consistent condom use. She has recent history of both yeast and BV and feels symptoms are the same. Describes vaginal itching and irritation with minimal change to vaginal discharge. Feels the condom lubricant recurrently causes vaginal irritation immediately after the sex. Prior infection diagnosed in April completely resolved.  History reviewed. No pertinent surgical history.  OB History   No obstetric history on file.      Home Medications    Prior to Admission medications   Medication Sig Start Date End Date Taking? Authorizing Provider  fluconazole (DIFLUCAN) 200 MG tablet Take one dose by mouth, wait 72 hours, and then take second dose by mouth 01/13/19   Scot Jun, FNP  metroNIDAZOLE (FLAGYL) 500 MG tablet Take 1 tablet (500 mg total) by mouth 2 (two) times daily with a meal. DO NOT CONSUME ALCOHOL WHILE TAKING THIS MEDICATION. 01/13/19   Scot Jun, FNP    Family History Family History  Problem Relation Age of Onset  . Hypertension Mother     Social History Social History   Tobacco Use  . Smoking status: Never Smoker  . Smokeless tobacco: Never Used  Substance Use Topics  . Alcohol use: Yes    Frequency: Never  . Drug use: Yes    Types: Marijuana     Allergies   Patient has no known allergies.   Review of Systems Review of Systems Pertinent negatives listed in HPI Physical Exam Triage Vital Signs ED Triage Vitals  Enc Vitals Group     BP 01/13/19 1031 105/75     Pulse Rate 01/13/19 1031 82     Resp 01/13/19 1031 16     Temp 01/13/19 1031 98.1 F (36.7 C)     Temp Source 01/13/19 1031 Oral     SpO2  01/13/19 1031 98 %     Weight --      Height --      Head Circumference --      Peak Flow --      Pain Score 01/13/19 1028 7     Pain Loc --      Pain Edu? --      Excl. in [place]? --    No data found.  Updated Vital Signs BP 105/75 (BP Location: Right Arm)   Pulse 82   Temp 98.1 F (36.7 C) (Oral)   Resp 16   SpO2 98%   Visual Acuity Right Eye Distance:   Left Eye Distance:   Bilateral Distance:    Right Eye Near:   Left Eye Near:    Bilateral Near:     Physical Exam   UC Treatments / Results  Labs (all labs ordered are listed, but only abnormal results are displayed) Labs Reviewed  POCT URINALYSIS DIP (DEVICE) - Abnormal; Notable for the following components:      Result Value   Leukocytes,Ua SMALL (*)    All other components within normal limits  POC URINE PREG, ED  POCT PREGNANCY, URINE  CERVICOVAGINAL ANCILLARY ONLY    EKG   Radiology No results found.  Procedures Procedures (including critical care time)  Medications Ordered in UC Medications - No data to display  Initial Impression / Assessment and Plan / UC Course  I have reviewed the triage vital signs and the nursing notes.  Pertinent labs & imaging results that were available during my care of the patient were reviewed by me and considered in my medical decision making (see chart for details).   Patient presents today with concerns for recurrent vaginitis. Urine pregnancy negative. BV and yeast covered with metrogel and diflucan. Cytology sent, patient will be contacted with any positive results that require additional treatment. Patient to refrain from sexual activity for the next 7 days. Return precautions given.  Final Clinical Impressions(s) / UC Diagnoses   Final diagnoses:  Vaginitis and vulvovaginitis   Discharge Instructions   None    ED Prescriptions    Medication Sig Dispense Auth. Provider   fluconazole (DIFLUCAN) 200 MG tablet Take one dose by mouth, wait 72 hours, and then  take second dose by mouth 2 tablet Bing NeighborsHarris, Huxton Glaus S, FNP   metroNIDAZOLE (FLAGYL) 500 MG tablet Take 1 tablet (500 mg total) by mouth 2 (two) times daily with a meal. DO NOT CONSUME ALCOHOL WHILE TAKING THIS MEDICATION. 14 tablet Bing NeighborsHarris, Antwan Bribiesca S, FNP     Controlled Substance Prescriptions [place] Controlled Substance Registry consulted? Not Applicable   Bing NeighborsHarris, Elizar Alpern S, FNP 01/14/19 1027

## 2019-01-15 LAB — CERVICOVAGINAL ANCILLARY ONLY
Bacterial vaginitis: NEGATIVE
Candida vaginitis: POSITIVE — AB
Chlamydia: NEGATIVE
Neisseria Gonorrhea: NEGATIVE
Trichomonas: NEGATIVE

## 2019-01-23 ENCOUNTER — Other Ambulatory Visit: Payer: Self-pay

## 2019-01-23 ENCOUNTER — Encounter (HOSPITAL_COMMUNITY): Payer: Self-pay | Admitting: Emergency Medicine

## 2019-01-23 ENCOUNTER — Ambulatory Visit (HOSPITAL_COMMUNITY)
Admission: EM | Admit: 2019-01-23 | Discharge: 2019-01-23 | Disposition: A | Discharge status: Home / Self Care | Payer: 59 | Attending: Family Medicine | Admitting: Family Medicine

## 2019-01-23 DIAGNOSIS — Z202 Contact with and (suspected) exposure to infections with a predominantly sexual mode of transmission: Secondary | ICD-10-CM

## 2019-01-23 DIAGNOSIS — Z711 Person with feared health complaint in whom no diagnosis is made: Secondary | ICD-10-CM | POA: Diagnosis not present

## 2019-01-23 NOTE — ED Triage Notes (Signed)
Pt here to be tested for vaginal Herpes.  Pt was with a partner on July 11 with protection.  She has since heard that he has herpes and she is concerned.  Pt is having no symptoms at this time.

## 2019-01-23 NOTE — Discharge Instructions (Signed)
Not likely that you have herpes and without a rash we cannot test.  Keep using protected sex as you have and if a rash appears come back and we can swab.  Follow up as needed for continued or worsening symptoms

## 2019-01-23 NOTE — ED Provider Notes (Signed)
[facility]    CSN: [id number] Arrival date & time: 01/23/19  1203     History   Chief Complaint Chief Complaint  Patient presents with  . Exposure to STD    HPI Jane Martin is a 23 y.o. female.   Patient is a 23 year old female who presents today  worried about herpes exposure.  Reported that she heard a rumor that a sexual partner she had tested positive for herpes.  Reported that this sexual encounter was protected with a condom.  She did not see a rash at that time. She is not having any signs of rash in the oral genital area.  No fevers, chills, night sweats.  Recently tested for STDs which was negative.  Recently treated for yeast infection and BV.  Those symptoms have resolved.  No abdominal pain, back pain, dysuria, hematuria or urine frequency.  ROS per HPI      History reviewed. No pertinent past medical history.  There are no active problems to display for this patient.   History reviewed. No pertinent surgical history.  OB History   No obstetric history on file.      Home Medications    Prior to Admission medications   Not on File    Family History Family History  Problem Relation Age of Onset  . Hypertension Mother     Social History Social History   Tobacco Use  . Smoking status: Never Smoker  . Smokeless tobacco: Never Used  Substance Use Topics  . Alcohol use: Yes    Frequency: Never  . Drug use: Yes    Types: Marijuana     Allergies   Patient has no known allergies.   Review of Systems Review of Systems   Physical Exam Triage Vital Signs ED Triage Vitals  Enc Vitals Group     BP 01/23/19 1218 114/74     Pulse Rate 01/23/19 1218 84     Resp 01/23/19 1218 12     Temp 01/23/19 1218 98.5 F (36.9 C)     Temp Source 01/23/19 1218 Oral     SpO2 01/23/19 1218 99 %     Weight --      Height --      Head Circumference --      Peak Flow --      Pain Score 01/23/19 1216 0     Pain Loc --      Pain Edu? --       Excl. in [place]? --    No data found.  Updated Vital Signs BP 114/74 (BP Location: Right Arm)   Pulse 84   Temp 98.5 F (36.9 C) (Oral)   Resp 12   LMP 01/21/2019   SpO2 99%   Visual Acuity Right Eye Distance:   Left Eye Distance:   Bilateral Distance:    Right Eye Near:   Left Eye Near:    Bilateral Near:     Physical Exam Vitals signs and nursing note reviewed.  Constitutional:      General: She is not in acute distress.    Appearance: Normal appearance. She is not ill-appearing, toxic-appearing or diaphoretic.  HENT:     Head: Normocephalic.     Nose: Nose normal.     Mouth/Throat:     Pharynx: Oropharynx is clear.  Eyes:     Conjunctiva/sclera: Conjunctivae normal.  Neck:     Musculoskeletal: Normal range of motion.  Pulmonary:     Effort: Pulmonary effort  is normal.  Musculoskeletal: Normal range of motion.  Skin:    General: Skin is warm and dry.     Findings: No rash.  Neurological:     Mental Status: She is alert.  Psychiatric:        Mood and Affect: Mood normal.      UC Treatments / Results  Labs (all labs ordered are listed, but only abnormal results are displayed) Labs Reviewed - No data to display  EKG   Radiology No results found.  Procedures Procedures (including critical care time)  Medications Ordered in UC Medications - No data to display  Initial Impression / Assessment and Plan / UC Course  I have reviewed the triage vital signs and the nursing notes.  Pertinent labs & imaging results that were available during my care of the patient were reviewed by me and considered in my medical decision making (see chart for details).     Reassured patient that small chance she was infected with HSV. She has no current rash and is otherwise feeling fine. She did use protected sex and there was a "rumer" that this partner supposedly was positive for HSV. Explained to her that we cannot test for herpes unless there is an active flare  of rash Patient understanding agreed and stated that she will return if the rash appears. Most of the visit was spent reassuring patient  Final Clinical Impressions(s) / UC Diagnoses   Final diagnoses:  Worried well     Discharge Instructions     Not likely that you have herpes and without a rash we cannot test.  Keep using protected sex as you have and if a rash appears come back and we can swab.  Follow up as needed for continued or worsening symptoms     ED Prescriptions    None     Controlled Substance Prescriptions [place] Controlled Substance Registry consulted? Not Applicable   Orvan July, NP 01/23/19 1241

## 2019-02-14 ENCOUNTER — Encounter (HOSPITAL_COMMUNITY): Payer: Self-pay | Admitting: Emergency Medicine

## 2019-02-14 ENCOUNTER — Other Ambulatory Visit: Payer: Self-pay

## 2019-02-14 ENCOUNTER — Ambulatory Visit (HOSPITAL_COMMUNITY)
Admission: EM | Admit: 2019-02-14 | Discharge: 2019-02-14 | Disposition: A | Discharge status: Home / Self Care | Payer: 59 | Attending: Family Medicine | Admitting: Family Medicine

## 2019-02-14 DIAGNOSIS — N761 Subacute and chronic vaginitis: Secondary | ICD-10-CM | POA: Insufficient documentation

## 2019-02-14 MED ORDER — FLUCONAZOLE 150 MG PO TABS: 150.0000 mg | ORAL_TABLET | Freq: Every day | ORAL | 0 refills | Status: DC

## 2019-02-14 NOTE — ED Triage Notes (Signed)
Vaginal discharge and itching started Monday or Sunday  Denies abdominal pain or back pain

## 2019-02-14 NOTE — Discharge Instructions (Addendum)
Treating you for yeast infection Take the medication as prescribed.  1 tab now and one in 3 days if still having symptoms  Follow up with OB/GYN

## 2019-02-14 NOTE — ED Provider Notes (Signed)
[name]    CSN: [id number] Arrival date & time: 02/14/19  5462     History   Chief Complaint Chief Complaint  Patient presents with  . Vaginal Discharge    HPI Jane Martin is a 23 y.o. female.   Patient is a 23 year old female presents today with vaginal discharge, itching and irritation.  This started approximately 3 days ago.  Symptoms have been constant.  History of chronic yeast infections.  Reporting feels the same.  She has not tried anything for her symptoms.  Denies any associate abdominal pain, back pain, fever, dysuria, hematuria or urinary frequency. Patient's last menstrual period was 01/21/2019. Currently sexually active.   ROS per HPI       History reviewed. No pertinent past medical history.  There are no active problems to display for this patient.   History reviewed. No pertinent surgical history.  OB History   No obstetric history on file.      Home Medications    Prior to Admission medications   Medication Sig Start Date End Date Taking? Authorizing Provider  fluconazole (DIFLUCAN) 150 MG tablet Take 1 tablet (150 mg total) by mouth daily. Take 1 tab now and 1 in 3 days if still having symptoms. 02/14/19   Orvan July, NP    Family History Family History  Problem Relation Age of Onset  . Hypertension Mother     Social History Social History   Tobacco Use  . Smoking status: Never Smoker  . Smokeless tobacco: Never Used  Substance Use Topics  . Alcohol use: Yes    Frequency: Never  . Drug use: Yes    Types: Marijuana     Allergies   Patient has no known allergies.   Review of Systems Review of Systems   Physical Exam Triage Vital Signs ED Triage Vitals  Enc Vitals Group     BP 02/14/19 0818 99/67     Pulse Rate 02/14/19 0818 89     Resp 02/14/19 0818 18     Temp 02/14/19 0818 98.5 F (36.9 C)     Temp Source 02/14/19 0818 Oral     SpO2 02/14/19 0818 99 %     Weight --      Height --    Head Circumference --      Peak Flow --      Pain Score 02/14/19 0816 0     Pain Loc --      Pain Edu? --      Excl. in [place]? --    No data found.  Updated Vital Signs BP 99/67 (BP Location: Right Arm)   Pulse 89   Temp 98.5 F (36.9 C) (Oral)   Resp 18   LMP 01/21/2019   SpO2 99%   Visual Acuity Right Eye Distance:   Left Eye Distance:   Bilateral Distance:    Right Eye Near:   Left Eye Near:    Bilateral Near:     Physical Exam Vitals signs and nursing note reviewed.  Constitutional:      General: She is not in acute distress.    Appearance: Normal appearance. She is not ill-appearing, toxic-appearing or diaphoretic.  HENT:     Head: Normocephalic.     Nose: Nose normal.     Mouth/Throat:     Pharynx: Oropharynx is clear.  Eyes:     Conjunctiva/sclera: Conjunctivae normal.  Neck:     Musculoskeletal: Normal range of motion.  Pulmonary:  Effort: Pulmonary effort is normal.  Abdominal:     Palpations: Abdomen is soft.     Tenderness: There is no abdominal tenderness.  Musculoskeletal: Normal range of motion.  Skin:    General: Skin is warm and dry.     Findings: No rash.  Neurological:     Mental Status: She is alert.  Psychiatric:        Mood and Affect: Mood normal.      UC Treatments / Results  Labs (all labs ordered are listed, but only abnormal results are displayed) Labs Reviewed  CERVICOVAGINAL ANCILLARY ONLY    EKG   Radiology No results found.  Procedures Procedures (including critical care time)  Medications Ordered in UC Medications - No data to display  Initial Impression / Assessment and Plan / UC Course  I have reviewed the triage vital signs and the nursing notes.  Pertinent labs & imaging results that were available during my care of the patient were reviewed by me and considered in my medical decision making (see chart for details).     Treating for yeast infection based on symptoms and hx with diflucan Swab sent  for testing.   Final Clinical Impressions(s) / UC Diagnoses   Final diagnoses:  Chronic vaginitis     Discharge Instructions     Treating you for yeast infection Take the medication as prescribed.  1 tab now and one in 3 days if still having symptoms  Follow up with OB/GYN    ED Prescriptions    Medication Sig Dispense Auth. Provider   fluconazole (DIFLUCAN) 150 MG tablet Take 1 tablet (150 mg total) by mouth daily. Take 1 tab now and 1 in 3 days if still having symptoms. 3 tablet Dahlia ByesBast, June Rode A, NP     Controlled Substance Prescriptions [place] Controlled Substance Registry consulted? Not Applicable   Janace ArisBast, [place] Mallozzi A, NP 02/14/19 [phone]

## 2019-02-16 LAB — CERVICOVAGINAL ANCILLARY ONLY
Bacterial vaginitis: NEGATIVE
Candida vaginitis: POSITIVE — AB
Chlamydia: NEGATIVE
Neisseria Gonorrhea: NEGATIVE
Trichomonas: NEGATIVE

## 2019-02-17 ENCOUNTER — Other Ambulatory Visit: Payer: Self-pay

## 2019-02-17 ENCOUNTER — Encounter (HOSPITAL_COMMUNITY): Payer: Self-pay

## 2019-02-17 ENCOUNTER — Ambulatory Visit (HOSPITAL_COMMUNITY)
Admission: EM | Admit: 2019-02-17 | Discharge: 2019-02-17 | Disposition: A | Discharge status: Home / Self Care | Payer: 59 | Attending: Family Medicine | Admitting: Family Medicine

## 2019-02-17 DIAGNOSIS — K130 Diseases of lips: Secondary | ICD-10-CM | POA: Insufficient documentation

## 2019-02-17 DIAGNOSIS — Z113 Encounter for screening for infections with a predominantly sexual mode of transmission: Secondary | ICD-10-CM | POA: Insufficient documentation

## 2019-02-17 MED ORDER — CLOTRIMAZOLE 1 % EX CREA: TOPICAL_CREAM | CUTANEOUS | 0 refills | Status: DC

## 2019-02-17 NOTE — Discharge Instructions (Signed)
We have sent testing for sexually transmitted infections. We will notify you of any positive results once they are received. If required, we will prescribe any medications you might need.  Please refrain from all sexual activity for at least the next seven days.  

## 2019-02-17 NOTE — ED Triage Notes (Signed)
Pt present a cold sore that is started to form on the side of her mouth. Pt recently had intercourse with her partner who has tested positive for herpes.

## 2019-02-17 NOTE — ED Provider Notes (Signed)
[place]   [id number] 02/17/19 Arrival Time: 3500  ASSESSMENT & PLAN:  1. Angular cheilitis   2. Screen for STD (sexually transmitted disease)     No skin lesions consistent with herpes. Reassured.   Discharge Instructions     We have sent testing for sexually transmitted infections. We will notify you of any positive results once they are received. If required, we will prescribe any medications you might need.  Please refrain from all sexual activity for at least the next seven days.     Meds ordered this encounter  Medications  . clotrimazole (LOTRIMIN) 1 % cream    Sig: Apply to affected area 2 times daily for up to one week.    Dispense:  15 g    Refill:  0    Pending: Labs Reviewed  HIV ANTIBODY (ROUTINE TESTING W REFLEX)  RPR  CERVICOVAGINAL ANCILLARY ONLY    Will notify of any positive results. Instructed to refrain from sexual activity for at least seven days.  Reviewed expectations re: course of current medical issues. Questions answered. Outlined signs and symptoms indicating need for more acute intervention. Patient verbalized understanding. After Visit Summary given.   SUBJECTIVE:  Jane Martin is a 23 y.o. female who presents with complaint of "irritated area" on R corner of mouth. Noticed a few days ago. Worried about herpes. Last penile-vaginal and oral sex with female partner approx 3 weeks ago. No vaginal lesions/rashes. No vaginal discharge. Does request STD screening. Normal PO intake without n/v. Afebrile. No abdominal or pelvic pain. No n/v. No rashes or lesions.  OTC treatment: none.  Patient's last menstrual period was 01/21/2019.  ROS: As per HPI. All other systems negative.   OBJECTIVE:  Vitals:   02/17/19 1041  BP: 119/78  Pulse: 84  Resp: 16  Temp: 98.6 F (37 C)  TempSrc: Oral  SpO2: 99%     General appearance: alert, cooperative, appears stated age and no distress Throat: lips, mucosa, and tongue normal;  teeth and gums normal; angular cheilitis on R CV: RRR Lungs: CTAB Back: no CVA tenderness; FROM at waist Abdomen: soft, non-tender GU: external genitalia appear normal; no lesions consistent with herpes Skin: warm and dry Psychological: alert and cooperative; normal mood and affect.   Labs Reviewed  CERVICOVAGINAL ANCILLARY ONLY    No Known Allergies   Family History  Problem Relation Age of Onset  . Hypertension Mother    Social History   Socioeconomic History  . Marital status: Single    Spouse name: Not on file  . Number of children: Not on file  . Years of education: Not on file  . Highest education level: Not on file  Occupational History  . Not on file  Social Needs  . Financial resource strain: Not on file  . Food insecurity    Worry: Not on file    Inability: Not on file  . Transportation needs    Medical: Not on file    Non-medical: Not on file  Tobacco Use  . Smoking status: Never Smoker  . Smokeless tobacco: Never Used  Substance and Sexual Activity  . Alcohol use: Yes    Frequency: Never  . Drug use: Yes    Types: Marijuana  . Sexual activity: Not on file  Lifestyle  . Physical activity    Days per week: Not on file    Minutes per session: Not on file  . Stress: Not on file  Relationships  . Social  connections    Talks on phone: Not on file    Gets together: Not on file    Attends religious service: Not on file    Active member of club or organization: Not on file    Attends meetings of clubs or organizations: Not on file    Relationship status: Not on file  . Intimate partner violence    Fear of current or ex partner: Not on file    Emotionally abused: Not on file    Physically abused: Not on file    Forced sexual activity: Not on file  Other Topics Concern  . Not on file  Social History Narrative  . Not on file          Mardella LaymanHagler, Aunesty Tyson, MD 02/19/19 [phone]

## 2019-02-17 NOTE — ED Notes (Signed)
At bedside for physician exam.  

## 2019-02-18 LAB — HIV ANTIBODY (ROUTINE TESTING W REFLEX): HIV Screen 4th Generation wRfx: NONREACTIVE

## 2019-02-18 LAB — RPR: RPR Ser Ql: NONREACTIVE

## 2019-02-20 LAB — CERVICOVAGINAL ANCILLARY ONLY
Bacterial vaginitis: POSITIVE — AB
Candida vaginitis: NEGATIVE
Chlamydia: NEGATIVE
Neisseria Gonorrhea: NEGATIVE
Trichomonas: NEGATIVE

## 2019-02-21 ENCOUNTER — Telehealth (HOSPITAL_COMMUNITY): Payer: Self-pay | Admitting: Emergency Medicine

## 2019-02-21 MED ORDER — METRONIDAZOLE 500 MG PO TABS: 500.0000 mg | ORAL_TABLET | Freq: Two times a day (BID) | ORAL | 0 refills | Status: AC

## 2019-02-21 NOTE — Telephone Encounter (Signed)
Bacterial vaginosis is positive. This was not treated at the urgent care visit.  Flagyl 500 mg BID x 7 days #14 no refills sent to patients pharmacy of choice.    Attempted to reach patient. No answer at this time. Mailbox full.   

## 2019-02-21 NOTE — Telephone Encounter (Signed)
Patient contacted and made aware of swab results, all questions answered.   

## 2019-03-06 DIAGNOSIS — Z3046 Encounter for surveillance of implantable subdermal contraceptive: Secondary | ICD-10-CM | POA: Diagnosis not present

## 2019-04-30 DIAGNOSIS — A5901 Trichomonal vulvovaginitis: Secondary | ICD-10-CM | POA: Diagnosis not present

## 2019-04-30 DIAGNOSIS — Z118 Encounter for screening for other infectious and parasitic diseases: Secondary | ICD-10-CM | POA: Diagnosis not present

## 2019-04-30 DIAGNOSIS — Z01419 Encounter for gynecological examination (general) (routine) without abnormal findings: Secondary | ICD-10-CM | POA: Diagnosis not present

## 2019-04-30 DIAGNOSIS — Z114 Encounter for screening for human immunodeficiency virus [HIV]: Secondary | ICD-10-CM | POA: Diagnosis not present

## 2019-04-30 DIAGNOSIS — Z1159 Encounter for screening for other viral diseases: Secondary | ICD-10-CM | POA: Diagnosis not present

## 2019-04-30 DIAGNOSIS — Z113 Encounter for screening for infections with a predominantly sexual mode of transmission: Secondary | ICD-10-CM | POA: Diagnosis not present

## 2019-04-30 DIAGNOSIS — Z6824 Body mass index (BMI) 24.0-24.9, adult: Secondary | ICD-10-CM | POA: Diagnosis not present

## 2019-05-29 ENCOUNTER — Ambulatory Visit (HOSPITAL_COMMUNITY)
Admission: EM | Admit: 2019-05-29 | Discharge: 2019-05-29 | Disposition: A | Discharge status: Home / Self Care | Payer: PRIVATE HEALTH INSURANCE | Attending: Nurse Practitioner | Admitting: Nurse Practitioner

## 2019-05-29 ENCOUNTER — Encounter (HOSPITAL_COMMUNITY): Payer: Self-pay

## 2019-05-29 ENCOUNTER — Other Ambulatory Visit: Payer: Self-pay

## 2019-05-29 DIAGNOSIS — R0981 Nasal congestion: Secondary | ICD-10-CM | POA: Insufficient documentation

## 2019-05-29 DIAGNOSIS — J069 Acute upper respiratory infection, unspecified: Secondary | ICD-10-CM | POA: Diagnosis not present

## 2019-05-29 DIAGNOSIS — Z79899 Other long term (current) drug therapy: Secondary | ICD-10-CM | POA: Insufficient documentation

## 2019-05-29 DIAGNOSIS — Z20828 Contact with and (suspected) exposure to other viral communicable diseases: Secondary | ICD-10-CM | POA: Diagnosis not present

## 2019-05-29 MED ORDER — FLUTICASONE PROPIONATE 50 MCG/ACT NA SUSP: 2.0000 | Freq: Every day | NASAL | 2 refills | Status: DC

## 2019-05-29 MED ORDER — FEXOFENADINE-PSEUDOEPHED ER 60-120 MG PO TB12: 1.0000 | ORAL_TABLET | Freq: Two times a day (BID) | ORAL | 0 refills | Status: DC

## 2019-05-29 NOTE — ED Triage Notes (Signed)
Patient presents to Urgent Care with complaints of stuffy nose and loss of taste since yesterday. Patient reports there have been a few covid cases at her job, would like a work note to be excused while covid test is pending, does not want rapid covid test.

## 2019-05-29 NOTE — ED Provider Notes (Signed)
[facility]    CSN: [id number] Arrival date & time: 05/29/19  1801      History   Chief Complaint Chief Complaint  Patient presents with   Nasal Congestion    HPI ARLYN BUMPUS is a 23 y.o. female.   Subjective:   BETHANNIE IGLEHART is a 23 y.o. female who presents for evaluation of symptoms of a URI. Symptoms include nasal blockage, productive cough, sinus and nasal congestion and sore throat.  He also endorses some change in taste and smell.  Onset of symptoms was 2 days ago and has been unchanged since that time. She denies any fevers, chills, body aches, nausea, vomiting, diarrhea, headache or dizziness. She is drinking plenty of fluids. Evaluation to date: none. Treatment to date: Home remedies without much relief in her symptoms.  She is working reports that there has been "some cases" of COVID at her job.  No sick contacts at home.  The following portions of the patient's history were reviewed and updated as appropriate: allergies, current medications, past family history, past medical history, past social history, past surgical history and problem list.          History reviewed. No pertinent past medical history.  There are no active problems to display for this patient.   History reviewed. No pertinent surgical history.  OB History   No obstetric history on file.      Home Medications    Prior to Admission medications   Medication Sig Start Date End Date Taking? Authorizing Provider  clotrimazole (LOTRIMIN) 1 % cream Apply to affected area 2 times daily for up to one week. 02/17/19   Mardella Layman, MD  fexofenadine-pseudoephedrine (ALLEGRA-D) 60-120 MG 12 hr tablet Take 1 tablet by mouth every 12 (twelve) hours. 05/29/19   Lurline Idol, FNP  fluconazole (DIFLUCAN) 150 MG tablet Take 1 tablet (150 mg total) by mouth daily. Take 1 tab now and 1 in 3 days if still having symptoms. 02/14/19   Bast, Gloris Manchester A, NP  fluticasone (FLONASE) 50  MCG/ACT nasal spray Place 2 sprays into both nostrils daily. 05/29/19   Lurline Idol, FNP    Family History Family History  Problem Relation Age of Onset   Hypertension Mother    Healthy Father     Social History Social History   Tobacco Use   Smoking status: Never Smoker   Smokeless tobacco: Never Used  Substance Use Topics   Alcohol use: Yes    Frequency: Never   Drug use: Yes    Types: Marijuana     Allergies   Patient has no known allergies.   Review of Systems Review of Systems  Constitutional: Negative for chills and fever.  HENT: Positive for congestion, rhinorrhea, sinus pressure and sore throat. Negative for ear pain.   Respiratory: Positive for cough. Negative for shortness of breath and wheezing.   Gastrointestinal: Negative for diarrhea, nausea and vomiting.  Musculoskeletal: Negative for myalgias.  Neurological: Negative for dizziness and headaches.  All other systems reviewed and are negative.    Physical Exam Triage Vital Signs ED Triage Vitals [05/29/19 1832]  Enc Vitals Group     BP      Pulse      Resp      Temp      Temp src      SpO2      Weight      Height      Head Circumference      Peak  Flow      Pain Score 0     Pain Loc      Pain Edu?      Excl. in GC?    No data found.  Updated Vital Signs BP 120/86 (BP Location: Right Arm)    Pulse 86    Temp 98.5 F (36.9 C) (Oral)    Resp 16    SpO2 100%   Visual Acuity Right Eye Distance:   Left Eye Distance:   Bilateral Distance:    Right Eye Near:   Left Eye Near:    Bilateral Near:     Physical Exam Vitals signs reviewed.  Constitutional:      General: She is not in acute distress.    Appearance: Normal appearance. She is not ill-appearing, toxic-appearing or diaphoretic.  HENT:     Head: Normocephalic.     Right Ear: Tympanic membrane, ear canal and external ear normal.     Left Ear: Tympanic membrane, ear canal and external ear normal.     Nose: Nose  normal.     Mouth/Throat:     Mouth: Mucous membranes are moist.     Pharynx: Oropharynx is clear.  Eyes:     Extraocular Movements: Extraocular movements intact.     Conjunctiva/sclera: Conjunctivae normal.     Pupils: Pupils are equal, round, and reactive to light.  Neck:     Musculoskeletal: Normal range of motion and neck supple.  Cardiovascular:     Rate and Rhythm: Normal rate and regular rhythm.  Pulmonary:     Effort: Pulmonary effort is normal.     Breath sounds: Normal breath sounds.  Musculoskeletal: Normal range of motion.  Skin:    General: Skin is warm and dry.  Neurological:     General: No focal deficit present.     Mental Status: She is alert and oriented to person, place, and time.  Psychiatric:        Mood and Affect: Mood normal.        Behavior: Behavior normal.      UC Treatments / Results  Labs (all labs ordered are listed, but only abnormal results are displayed) Labs Reviewed  NOVEL CORONAVIRUS, NAA (HOSP ORDER, SEND-OUT TO REF LAB; TAT 18-24 HRS)    EKG   Radiology No results found.  Procedures Procedures (including critical care time)  Medications Ordered in UC Medications - No data to display  Initial Impression / Assessment and Plan / UC Course  I have reviewed the triage vital signs and the nursing notes.  Pertinent labs & imaging results that were available during my care of the patient were reviewed by me and considered in my medical decision making (see chart for details).    23 yo female presenting with a two-day history of nasal blockage, productive cough, sinus/nasal congestion, sore throat and changes in taste/smell.  There is no known Covid exposure but patient reports that there has been "some cases" at her job.  Patient is afebrile.  Nontoxic-appearing.  Physical exam unremarkable. Discussed diagnosis and treatment of URI. Suggested symptomatic OTC remedies. Follow up as needed.  Today's evaluation has revealed no signs of  a dangerous process. Discussed diagnosis with patient and/or guardian. Patient and/or guardian aware of their diagnosis, possible red flag symptoms to watch out for and need for close follow up. Patient and/or guardian understands verbal and written discharge instructions. Patient and/or guardian comfortable with plan and disposition.  Patient and/or guardian has a clear mental status  at this time, good insight into illness (after discussion and teaching) and has clear judgment to make decisions regarding their care  This care was provided during an unprecedented National Emergency due to the Novel Coronavirus (COVID-19) pandemic. COVID-19 infections and transmission risks place heavy strains on healthcare resources.  As this pandemic evolves, our facility, providers, and staff strive to respond fluidly, to remain operational, and to provide care relative to available resources and information. Outcomes are unpredictable and treatments are without well-defined guidelines. Further, the impact of COVID-19 on all aspects of urgent care, including the impact to patients seeking care for reasons other than COVID-19, is unavoidable during this national emergency. At this time of the global pandemic, management of patients has significantly changed, even for non-COVID positive patients given high local and regional COVID volumes at this time requiring high healthcare system and resource utilization. The standard of care for management of both COVID suspected and non-COVID suspected patients continues to change rapidly at the local, regional, national, and global levels. This patient was worked up and treated to the best available but ever changing evidence and resources available at this current time.   Documentation was completed with the aid of voice recognition software. Transcription may contain typographical errors. Final Clinical Impressions(s) / UC Diagnoses   Final diagnoses:  Viral URI with cough  Nasal  congestion     Discharge Instructions     Take medications as prescribed. You may take tylenol or ibuprofen as needed for fevers/headache/body aches. Drink plenty of fluids. Stay in home isolation until you receive results of your COVID test. You will only be notified for positive results. You may go online to MyChart in the next few days and review your results. Please follow CDC guidelines that are attached. You may discontinue home isolation when there has been at least 10 days since symptoms onset AND 3 days fever free without antipyretics (Tylenol or Ibuprofen) AND an overall improvement in your symptoms.  Go to the ED immediately if you get worse or have any other symptoms.   Feel better soon!  Aldona Bar, FNP-C      ED Prescriptions    Medication Sig Dispense Auth. Provider   fexofenadine-pseudoephedrine (ALLEGRA-D) 60-120 MG 12 hr tablet Take 1 tablet by mouth every 12 (twelve) hours. 30 tablet Maleta Pacha, Aldona Bar, FNP   fluticasone (FLONASE) 50 MCG/ACT nasal spray Place 2 sprays into both nostrils daily. 9.9 mL Enrique Sack, FNP     PDMP not reviewed this encounter.   Enrique Sack, [place] 05/29/19 1909

## 2019-05-29 NOTE — Discharge Instructions (Addendum)
Take medications as prescribed. You may take tylenol or ibuprofen as needed for fevers/headache/body aches. Drink plenty of fluids. Stay in home isolation until you receive results of your COVID test. You will only be notified for positive results. You may go online to MyChart in the next few days and review your results. Please follow CDC guidelines that are attached. You may discontinue home isolation when there has been at least 10 days since symptoms onset AND 3 days fever free without antipyretics (Tylenol or Ibuprofen) AND an overall improvement in your symptoms. Go to the ED immediately if you get worse or have any other symptoms.   Feel better soon!  Joscelyne Renville, FNP-C   

## 2019-05-31 LAB — NOVEL CORONAVIRUS, NAA (HOSP ORDER, SEND-OUT TO REF LAB; TAT 18-24 HRS): SARS-CoV-2, NAA: NOT DETECTED

## 2019-06-09 ENCOUNTER — Encounter (HOSPITAL_COMMUNITY): Payer: Self-pay

## 2019-06-09 ENCOUNTER — Other Ambulatory Visit: Payer: Self-pay

## 2019-06-09 ENCOUNTER — Ambulatory Visit (HOSPITAL_COMMUNITY)
Admission: EM | Admit: 2019-06-09 | Discharge: 2019-06-09 | Disposition: A | Discharge status: Home / Self Care | Payer: PRIVATE HEALTH INSURANCE | Attending: Family Medicine | Admitting: Family Medicine

## 2019-06-09 DIAGNOSIS — N898 Other specified noninflammatory disorders of vagina: Secondary | ICD-10-CM | POA: Diagnosis not present

## 2019-06-09 MED ORDER — FLUCONAZOLE 150 MG PO TABS: ORAL_TABLET | ORAL | 0 refills | Status: DC

## 2019-06-09 MED ORDER — AZITHROMYCIN 250 MG PO TABS
ORAL_TABLET | ORAL | Status: AC
  Filled 2019-06-09: qty 4

## 2019-06-09 MED ORDER — AZITHROMYCIN 250 MG PO TABS
1000.0000 mg | ORAL_TABLET | Freq: Once | ORAL | Status: AC
  Administered 2019-06-09: 13:00:00 1000 mg via ORAL

## 2019-06-09 MED ORDER — CEFTRIAXONE SODIUM 250 MG IJ SOLR
INTRAMUSCULAR | Status: AC
  Filled 2019-06-09: qty 250

## 2019-06-09 MED ORDER — CEFTRIAXONE SODIUM 250 MG IJ SOLR
250.0000 mg | Freq: Once | INTRAMUSCULAR | Status: AC
  Administered 2019-06-09: 250 mg via INTRAMUSCULAR

## 2019-06-09 NOTE — Discharge Instructions (Signed)

## 2019-06-11 NOTE — ED Provider Notes (Signed)
[facility]   [id number] 06/09/19 Arrival Time: 1035  ASSESSMENT & PLAN:  1. Vaginal discharge   2. Vaginal itching     No s/s of PID. Benign abdominal exam.  Begin: Meds ordered this encounter  Medications  . fluconazole (DIFLUCAN) 150 MG tablet    Sig: Take one tablet by mouth as a single dose. May repeat in 3 days if symptoms persist.    Dispense:  2 tablet    Refill:  0     Discharge Instructions     You have been given the following medications today for treatment of suspected gonorrhea and/or chlamydia:  cefTRIAXone (ROCEPHIN) injection 250 mg azithromycin (ZITHROMAX) tablet 1,000 mg  Even though we have treated you today, we have sent testing for sexually transmitted infections. We will notify you of any positive results once they are received. If required, we will prescribe any medications you might need.  Please refrain from all sexual activity for at least the next seven days.     Pending: Labs Reviewed  CERVICOVAGINAL ANCILLARY ONLY    Will notify of any positive results. Instructed to refrain from sexual activity for at least seven days.  Reviewed expectations re: course of current medical issues. Questions answered. Outlined signs and symptoms indicating need for more acute intervention. Patient verbalized understanding. After Visit Summary given.    SUBJECTIVE:  Jane Martin is a 23 y.o. female who presents with complaint of vaginal discharge and mild vaginal itching. Onset gradual. First noticed several days ago. Describes discharge as thick and white. Denies: urinary frequency and dysuria. Afebrile. No abdominal or pelvic pain. Normal PO intake wihout n/v. No rashes or lesions. Reports that she is sexually active with single female partner. OTC treatment: none reported. History of STI: none reported.  Patient's last menstrual period was 05/23/2019.  ROS: As per HPI. All other systems negative.   OBJECTIVE:  Vitals:   06/09/19 1137 06/09/19 1138  BP: 111/70   Pulse: 66   Resp: 16   Temp: 98.5 F (36.9 C)   SpO2: 99%   Weight:  65.8 kg     General appearance: alert, cooperative, appears stated age and no distress Throat: lips, mucosa, and tongue normal; teeth and gums normal CV: RRR Lungs: CTAB Back: no CVA tenderness; FROM at waist Abdomen: soft, non-tender GU: (chaperone present) thick white clumpy vaginal discharge; no skin lesions Skin: warm and dry Psychological: alert and cooperative; normal mood and affect.   Labs Reviewed  CERVICOVAGINAL ANCILLARY ONLY    No Known Allergies   Family History  Problem Relation Age of Onset  . Hypertension Mother   . Healthy Father    Social History   Socioeconomic History  . Marital status: Single    Spouse name: Not on file  . Number of children: Not on file  . Years of education: Not on file  . Highest education level: Not on file  Occupational History  . Not on file  Social Needs  . Financial resource strain: Not on file  . Food insecurity    Worry: Not on file    Inability: Not on file  . Transportation needs    Medical: Not on file    Non-medical: Not on file  Tobacco Use  . Smoking status: Never Smoker  . Smokeless tobacco: Never Used  Substance and Sexual Activity  . Alcohol use: Yes    Frequency: Never  . Drug use: Yes    Types: Marijuana  . Sexual activity:  Not on file  Lifestyle  . Physical activity    Days per week: Not on file    Minutes per session: Not on file  . Stress: Not on file  Relationships  . Social Herbalist on phone: Not on file    Gets together: Not on file    Attends religious service: Not on file    Active member of club or organization: Not on file    Attends meetings of clubs or organizations: Not on file    Relationship status: Not on file  . Intimate partner violence    Fear of current or ex partner: Not on file    Emotionally abused: Not on file    Physically abused: Not on  file    Forced sexual activity: Not on file  Other Topics Concern  . Not on file  Social History Narrative  . Not on file          Vanessa Kick, MD 06/11/19 [phone]

## 2019-06-13 LAB — CERVICOVAGINAL ANCILLARY ONLY
Bacterial vaginitis: NEGATIVE
Candida vaginitis: POSITIVE — AB
Chlamydia: NEGATIVE
Neisseria Gonorrhea: NEGATIVE
Trichomonas: NEGATIVE

## 2019-07-04 ENCOUNTER — Ambulatory Visit (HOSPITAL_COMMUNITY)
Admission: EM | Admit: 2019-07-04 | Discharge: 2019-07-04 | Disposition: A | Discharge status: Home / Self Care | Payer: PRIVATE HEALTH INSURANCE | Attending: Family Medicine | Admitting: Family Medicine

## 2019-07-04 ENCOUNTER — Encounter (HOSPITAL_COMMUNITY): Payer: Self-pay

## 2019-07-04 ENCOUNTER — Other Ambulatory Visit: Payer: Self-pay

## 2019-07-04 DIAGNOSIS — Z20828 Contact with and (suspected) exposure to other viral communicable diseases: Secondary | ICD-10-CM | POA: Insufficient documentation

## 2019-07-04 DIAGNOSIS — Z0189 Encounter for other specified special examinations: Secondary | ICD-10-CM | POA: Diagnosis not present

## 2019-07-04 DIAGNOSIS — Z8249 Family history of ischemic heart disease and other diseases of the circulatory system: Secondary | ICD-10-CM | POA: Diagnosis not present

## 2019-07-04 NOTE — ED Provider Notes (Signed)
[facility]   [id number] 07/04/19 Arrival Time: 1055  ASSESSMENT & PLAN:  1. Patient request for diagnostic testing      COVID-19 testing sent for travel. No symptoms.  Follow-up Information    [facility].   Specialty: Urgent Care Why: As needed. Contact information: [address] [place] [address] [phone]          Reviewed expectations re: course of current medical issues. Questions answered. Outlined signs and symptoms indicating need for more acute intervention. Patient verbalized understanding. After Visit Summary given.   SUBJECTIVE: History from: patient. Jane Martin is a 23 y.o. female who requests COVID-19 testing. Known COVID-19 contact: none. Recent travel: none; planning to travel to [place]. Denies: runny nose, congestion, fever, cough, sore throat, difficulty breathing and headache. Normal PO intake without n/v/d.  ROS: As per HPI.   OBJECTIVE:  Vitals:   07/04/19 1143  BP: 110/70  Pulse: 85  Resp: 16  Temp: 98.3 F (36.8 C)  TempSrc: Oral  SpO2: 100%    General appearance: alert; no distress Eyes: PERRLA; EOMI; conjunctiva normal HENT: [place]; AT; nasal mucosa normal; oral mucosa normal Neck: supple  Lungs: speaks full sentences without difficulty; unlabored Heart: regular rate and rhythm Abdomen: soft, non-tender Extremities: no edema Skin: warm and dry Neurologic: normal gait Psychological: alert and cooperative; normal mood and affect  Labs:  Labs Reviewed  NOVEL CORONAVIRUS, NAA (HOSP ORDER, SEND-OUT TO REF LAB; TAT 18-24 HRS)     No Known Allergies   Social History   Socioeconomic History  . Marital status: Single    Spouse name: Not on file  . Number of children: Not on file  . Years of education: Not on file  . Highest education level: Not on file  Occupational History  . Not on file  Tobacco Use  . Smoking status: Never Smoker  . Smokeless  tobacco: Never Used  Substance and Sexual Activity  . Alcohol use: Yes  . Drug use: Yes    Types: Marijuana  . Sexual activity: Not on file  Other Topics Concern  . Not on file  Social History Narrative  . Not on file   Social Determinants of Health   Financial Resource Strain:   . Difficulty of Paying Living Expenses: Not on file  Food Insecurity:   . Worried About Programme researcher, broadcasting/film/video in the Last Year: Not on file  . Ran Out of Food in the Last Year: Not on file  Transportation Needs:   . Lack of Transportation (Medical): Not on file  . Lack of Transportation (Non-Medical): Not on file  Physical Activity:   . Days of Exercise per Week: Not on file  . Minutes of Exercise per Session: Not on file  Stress:   . Feeling of Stress : Not on file  Social Connections:   . Frequency of Communication with Friends and Family: Not on file  . Frequency of Social Gatherings with Friends and Family: Not on file  . Attends Religious Services: Not on file  . Active Member of Clubs or Organizations: Not on file  . Attends Banker Meetings: Not on file  . Marital Status: Not on file  Intimate Partner Violence:   . Fear of Current or Ex-Partner: Not on file  . Emotionally Abused: Not on file  . Physically Abused: Not on file  . Sexually Abused: Not on file   Family History  Problem Relation  Age of Onset  . Hypertension Mother   . Healthy Father    History reviewed. No pertinent surgical history.   Vanessa Kick, MD 07/04/19 1200

## 2019-07-04 NOTE — ED Triage Notes (Signed)
Pt is here for a covid test so that she can travel. At this time pt denies having any symptoms .

## 2019-07-04 NOTE — Discharge Instructions (Signed)
If your Covid-19 test is positive, you will receive a phone call from [facility] regarding your results. Negative test results are not called. Both positive and negative results area always visible on MyChart. If you do not have a MyChart account, sign up instructions are in your discharge papers.  

## 2019-07-05 LAB — NOVEL CORONAVIRUS, NAA (HOSP ORDER, SEND-OUT TO REF LAB; TAT 18-24 HRS): SARS-CoV-2, NAA: NOT DETECTED

## 2019-10-15 ENCOUNTER — Ambulatory Visit (HOSPITAL_COMMUNITY)
Admission: EM | Admit: 2019-10-15 | Discharge: 2019-10-15 | Disposition: A | Discharge status: Home / Self Care | Payer: 59 | Attending: Family Medicine | Admitting: Family Medicine

## 2019-10-15 ENCOUNTER — Encounter (HOSPITAL_COMMUNITY): Payer: Self-pay | Admitting: Emergency Medicine

## 2019-10-15 ENCOUNTER — Other Ambulatory Visit: Payer: Self-pay

## 2019-10-15 DIAGNOSIS — J029 Acute pharyngitis, unspecified: Secondary | ICD-10-CM | POA: Insufficient documentation

## 2019-10-15 DIAGNOSIS — Z20822 Contact with and (suspected) exposure to covid-19: Secondary | ICD-10-CM | POA: Insufficient documentation

## 2019-10-15 DIAGNOSIS — J309 Allergic rhinitis, unspecified: Secondary | ICD-10-CM | POA: Diagnosis not present

## 2019-10-15 DIAGNOSIS — Z79899 Other long term (current) drug therapy: Secondary | ICD-10-CM | POA: Insufficient documentation

## 2019-10-15 DIAGNOSIS — Z113 Encounter for screening for infections with a predominantly sexual mode of transmission: Secondary | ICD-10-CM | POA: Insufficient documentation

## 2019-10-15 LAB — POCT RAPID STREP A: Streptococcus, Group A Screen (Direct): NEGATIVE

## 2019-10-15 MED ORDER — FLUCONAZOLE 150 MG PO TABS: 150.0000 mg | ORAL_TABLET | Freq: Once | ORAL | 0 refills | Status: AC

## 2019-10-15 MED ORDER — CETIRIZINE HCL 10 MG PO CAPS: 10.0000 mg | ORAL_CAPSULE | Freq: Every day | ORAL | 0 refills | Status: DC

## 2019-10-15 NOTE — ED Triage Notes (Signed)
Pt sts sore throat x 5 days; pt denies fever; pt also requesting STD screening

## 2019-10-15 NOTE — ED Provider Notes (Signed)
[facility]    CSN: [id number] Arrival date & time: 10/15/19  1156      History   Chief Complaint Chief Complaint  Patient presents with  . Sore Throat  . Exposure to STD    HPI Jane Martin is a 24 y.o. female a significant past medical history presenting today for evaluation of sore throat and STD screening.  Patient notes that for the past 5 days she has had a sore throat.  Sore throat has been intermittent.  Reports that it is relatively mild, but concerned that is persistent.  Has had associated congestion and mucus noted in throat.  Denies cough.  Denies fevers chills or body aches.  Normal energy level.  Normal oral intake.  Denies any close sick contacts.  Also would like to be screened for STDs.  Denies known exposures.  Has had increase in thicker white discharge recently.  Denies associating itching or irritation, but has felt slightly dry in genital area.  Last menstrual cycle was end of March, not on birth control.  HPI  History reviewed. No pertinent past medical history.  There are no problems to display for this patient.   History reviewed. No pertinent surgical history.  OB History   No obstetric history on file.      Home Medications    Prior to Admission medications   Medication Sig Start Date End Date Taking? Authorizing Provider  Cetirizine HCl 10 MG CAPS Take 1 capsule (10 mg total) by mouth daily. 10/15/19   Mylena Sedberry C, PA-C  clotrimazole (LOTRIMIN) 1 % cream Apply to affected area 2 times daily for up to one week. 02/17/19   Mardella Layman, MD  fexofenadine-pseudoephedrine (ALLEGRA-D) 60-120 MG 12 hr tablet Take 1 tablet by mouth every 12 (twelve) hours. 05/29/19   Lurline Idol, FNP  fluconazole (DIFLUCAN) 150 MG tablet Take 1 tablet (150 mg total) by mouth once for 1 dose. 10/15/19 10/15/19  Natsumi Whitsitt C, PA-C  fluticasone (FLONASE) 50 MCG/ACT nasal spray Place 2 sprays into both nostrils daily. 05/29/19   Lurline Idol, FNP    Family History Family History  Problem Relation Age of Onset  . Hypertension Mother   . Healthy Father     Social History Social History   Tobacco Use  . Smoking status: Never Smoker  . Smokeless tobacco: Never Used  Substance Use Topics  . Alcohol use: Yes  . Drug use: Yes    Types: Marijuana     Allergies   Patient has no known allergies.   Review of Systems Review of Systems  Constitutional: Negative for activity change, appetite change and fever.  HENT: Positive for congestion and sore throat.   Respiratory: Negative for cough and shortness of breath.   Cardiovascular: Negative for chest pain.  Gastrointestinal: Negative for abdominal pain, diarrhea, nausea and vomiting.  Genitourinary: Positive for vaginal discharge. Negative for dysuria, flank pain, genital sores, hematuria, menstrual problem, vaginal bleeding and vaginal pain.  Musculoskeletal: Negative for back pain.  Skin: Negative for rash.  Neurological: Negative for dizziness, light-headedness and headaches.     Physical Exam Triage Vital Signs ED Triage Vitals [10/15/19 1325]  Enc Vitals Group     BP 118/82     Pulse Rate 69     Resp 18     Temp 98.1 F (36.7 C)     Temp Source Oral     SpO2 96 %     Weight  Height      Head Circumference      Peak Flow      Pain Score 3     Pain Loc      Pain Edu?      Excl. in GC?    No data found.  Updated Vital Signs BP 118/82 (BP Location: Right Arm)   Pulse 69   Temp 98.1 F (36.7 C) (Oral)   Resp 18   SpO2 96%   Visual Acuity Right Eye Distance:   Left Eye Distance:   Bilateral Distance:    Right Eye Near:   Left Eye Near:    Bilateral Near:     Physical Exam Vitals and nursing note reviewed.  Constitutional:      Appearance: She is well-developed.     Comments: No acute distress  HENT:     Head: Normocephalic and atraumatic.     Ears:     Comments: Bilateral ears without tenderness to palpation of  external auricle, tragus and mastoid, EAC's without erythema or swelling, TM's with good bony landmarks and cone of light. Non erythematous.     Nose: Nose normal.     Mouth/Throat:     Comments: Oral mucosa pink and moist, no tonsillar enlargement or exudate. Posterior pharynx patent and mildly erythematous, no uvula deviation or swelling. Normal phonation. Eyes:     Conjunctiva/sclera: Conjunctivae normal.  Cardiovascular:     Rate and Rhythm: Normal rate.  Pulmonary:     Effort: Pulmonary effort is normal. No respiratory distress.     Comments: Breathing comfortably at rest, CTABL, no wheezing, rales or other adventitious sounds auscultated Abdominal:     General: There is no distension.  Genitourinary:    Comments: Normal external female genitalia, cervix pink without erythema, moderate amount of thick white discharge Musculoskeletal:        General: Normal range of motion.     Cervical back: Neck supple.  Skin:    General: Skin is warm and dry.  Neurological:     Mental Status: She is alert and oriented to person, place, and time.      UC Treatments / Results  Labs (all labs ordered are listed, but only abnormal results are displayed) Labs Reviewed  SARS CORONAVIRUS 2 (TAT 6-24 HRS)  CULTURE, GROUP A STREP [facility])  POCT RAPID STREP A  CERVICOVAGINAL ANCILLARY ONLY    EKG   Radiology No results found.  Procedures Procedures (including critical care time)  Medications Ordered in UC Medications - No data to display  Initial Impression / Assessment and Plan / UC Course  I have reviewed the triage vital signs and the nursing notes.  Pertinent labs & imaging results that were available during my care of the patient were reviewed by me and considered in my medical decision making (see chart for details).     Strep test negative, Covid PCR pending.  Most likely throat irritation from postnasal drainage and allergies.  Continuing symptomatic and supportive care,  adding in cetirizine.  Vaginal swab pending, empirically treating for yeast today, will call with results and alter treatment as needed.  Discussed strict return precautions. Patient verbalized understanding and is agreeable with plan.  Final Clinical Impressions(s) / UC Diagnoses   Final diagnoses:  Sore throat  Allergic rhinitis, unspecified seasonality, unspecified trigger  Screen for STD (sexually transmitted disease)     Discharge Instructions     Please continue Flonase, add in daily cetirizine to help with congestion/postnasal drainage contributing  to throat irritation Please drink plenty of fluids Hot tea with daily local honey can also further help with allergies  Vaginal swab pending for STD screening, monitor my chart for results May take 1 tablet of Diflucan today, may repeat in 3 to 4 days if still having symptoms and swab returns positive for yeast  Please follow-up if any symptoms not improving or worsening   ED Prescriptions    Medication Sig Dispense Auth. Provider   Cetirizine HCl 10 MG CAPS Take 1 capsule (10 mg total) by mouth daily. 20 capsule Myna Freimark C, PA-C   fluconazole (DIFLUCAN) 150 MG tablet Take 1 tablet (150 mg total) by mouth once for 1 dose. 2 tablet Makiya Jeune, [place] C, PA-C     PDMP not reviewed this encounter.   Janith Lima, PA-C 10/15/19 1407

## 2019-10-15 NOTE — Discharge Instructions (Signed)
Please continue Flonase, add in daily cetirizine to help with congestion/postnasal drainage contributing to throat irritation Please drink plenty of fluids Hot tea with daily local honey can also further help with allergies  Vaginal swab pending for STD screening, monitor my chart for results May take 1 tablet of Diflucan today, may repeat in 3 to 4 days if still having symptoms and swab returns positive for yeast  Please follow-up if any symptoms not improving or worsening

## 2019-10-16 ENCOUNTER — Telehealth (HOSPITAL_COMMUNITY): Payer: Self-pay

## 2019-10-16 LAB — CERVICOVAGINAL ANCILLARY ONLY
Bacterial Vaginitis (gardnerella): POSITIVE — AB
Candida Glabrata: NEGATIVE
Candida Vaginitis: NEGATIVE
Chlamydia: NEGATIVE
Comment: NEGATIVE
Comment: NEGATIVE
Comment: NEGATIVE
Comment: NEGATIVE
Comment: NEGATIVE
Comment: NORMAL
Neisseria Gonorrhea: NEGATIVE
Trichomonas: NEGATIVE

## 2019-10-16 LAB — SARS CORONAVIRUS 2 (TAT 6-24 HRS): SARS Coronavirus 2: NEGATIVE

## 2019-10-16 MED ORDER — METRONIDAZOLE 500 MG PO TABS: 500.0000 mg | ORAL_TABLET | Freq: Two times a day (BID) | ORAL | 0 refills | Status: DC

## 2019-10-17 LAB — CULTURE, GROUP A STREP (THRC)

## 2020-04-25 ENCOUNTER — Encounter (HOSPITAL_COMMUNITY): Payer: Self-pay | Admitting: Emergency Medicine

## 2020-04-25 ENCOUNTER — Ambulatory Visit (HOSPITAL_COMMUNITY)
Admission: EM | Admit: 2020-04-25 | Discharge: 2020-04-25 | Disposition: A | Discharge status: Home / Self Care | Payer: PRIVATE HEALTH INSURANCE | Attending: Family Medicine | Admitting: Family Medicine

## 2020-04-25 ENCOUNTER — Other Ambulatory Visit: Payer: Self-pay

## 2020-04-25 DIAGNOSIS — N898 Other specified noninflammatory disorders of vagina: Secondary | ICD-10-CM | POA: Diagnosis not present

## 2020-04-25 MED ORDER — METRONIDAZOLE 500 MG PO TABS: 500.0000 mg | ORAL_TABLET | Freq: Two times a day (BID) | ORAL | 0 refills | Status: DC

## 2020-04-25 NOTE — ED Triage Notes (Signed)
Pt presents with vaginal discharge xs 1 week. Pt request STD screening.

## 2020-04-25 NOTE — Discharge Instructions (Signed)
Medication as prescribed. Swab pending.  You can check your MyChart for results.

## 2020-04-28 LAB — CERVICOVAGINAL ANCILLARY ONLY
Bacterial Vaginitis (gardnerella): POSITIVE — AB
Candida Glabrata: POSITIVE — AB
Candida Vaginitis: POSITIVE — AB
Chlamydia: NEGATIVE
Comment: NEGATIVE
Comment: NEGATIVE
Comment: NEGATIVE
Comment: NEGATIVE
Comment: NEGATIVE
Comment: NORMAL
Neisseria Gonorrhea: POSITIVE — AB
Trichomonas: NEGATIVE

## 2020-04-28 NOTE — ED Provider Notes (Signed)
[facility]    CSN: [id number] Arrival date & time: 04/25/20  0803      History   Chief Complaint Chief Complaint  Patient presents with  . Vaginal Discharge    HPI Jane Martin is a 24 y.o. female.   Patient is a 22-year female presents today with vaginal discharge, itching and irritation x1 week.  Symptoms have been constant.  Requesting STD screen.  No dysuria, hematuria or urinary frequency.  No abdominal pain, back pain or fevers.  History of BV and feels similar.     History reviewed. No pertinent past medical history.  There are no problems to display for this patient.   History reviewed. No pertinent surgical history.  OB History   No obstetric history on file.      Home Medications    Prior to Admission medications   Medication Sig Start Date End Date Taking? Authorizing Provider  Cetirizine HCl 10 MG CAPS Take 1 capsule (10 mg total) by mouth daily. 10/15/19   Wieters, Hallie C, PA-C  clotrimazole (LOTRIMIN) 1 % cream Apply to affected area 2 times daily for up to one week. 02/17/19   Mardella Layman, MD  fexofenadine-pseudoephedrine (ALLEGRA-D) 60-120 MG 12 hr tablet Take 1 tablet by mouth every 12 (twelve) hours. 05/29/19   Lurline Idol, FNP  fluticasone (FLONASE) 50 MCG/ACT nasal spray Place 2 sprays into both nostrils daily. 05/29/19   Lurline Idol, FNP  metroNIDAZOLE (FLAGYL) 500 MG tablet Take 1 tablet (500 mg total) by mouth 2 (two) times daily. 04/25/20   Janace Aris, NP    Family History Family History  Problem Relation Age of Onset  . Hypertension Mother   . Healthy Father     Social History Social History   Tobacco Use  . Smoking status: Never Smoker  . Smokeless tobacco: Never Used  Vaping Use  . Vaping Use: Never used  Substance Use Topics  . Alcohol use: Yes  . Drug use: Yes    Types: Marijuana     Allergies   Patient has no known allergies.   Review of Systems Review of Systems   Physical  Exam Triage Vital Signs ED Triage Vitals  Enc Vitals Group     BP 04/25/20 0840 115/69     Pulse Rate 04/25/20 0840 66     Resp 04/25/20 0840 16     Temp 04/25/20 0840 98.2 F (36.8 C)     Temp Source 04/25/20 0840 Oral     SpO2 04/25/20 0840 100 %     Weight --      Height --      Head Circumference --      Peak Flow --      Pain Score 04/25/20 0839 0     Pain Loc --      Pain Edu? --      Excl. in GC? --    No data found.  Updated Vital Signs BP 115/69 (BP Location: Left Arm)   Pulse 66   Temp 98.2 F (36.8 C) (Oral)   Resp 16   LMP 04/07/2020   SpO2 100%   Visual Acuity Right Eye Distance:   Left Eye Distance:   Bilateral Distance:    Right Eye Near:   Left Eye Near:    Bilateral Near:     Physical Exam Vitals and nursing note reviewed.  Constitutional:      General: She is not in acute distress.    Appearance:  Normal appearance. She is not ill-appearing, toxic-appearing or diaphoretic.  HENT:     Head: Normocephalic.     Nose: Nose normal.  Eyes:     Conjunctiva/sclera: Conjunctivae normal.  Pulmonary:     Effort: Pulmonary effort is normal.  Musculoskeletal:        General: Normal range of motion.     Cervical back: Normal range of motion.  Skin:    General: Skin is warm and dry.     Findings: No rash.  Neurological:     Mental Status: She is alert.  Psychiatric:        Mood and Affect: Mood normal.      UC Treatments / Results  Labs (all labs ordered are listed, but only abnormal results are displayed) Labs Reviewed  CERVICOVAGINAL ANCILLARY ONLY    EKG   Radiology No results found.  Procedures Procedures (including critical care time)  Medications Ordered in UC Medications - No data to display  Initial Impression / Assessment and Plan / UC Course  I have reviewed the triage vital signs and the nursing notes.  Pertinent labs & imaging results that were available during my care of the patient were reviewed by me and  considered in my medical decision making (see chart for details).     Vaginal discharge Swab sent for screening Treating for BV based on history and symptoms Follow up as needed for continued or worsening symptoms  Final Clinical Impressions(s) / UC Diagnoses   Final diagnoses:  Vaginal discharge     Discharge Instructions     Medication as prescribed. Swab pending.  You can check your MyChart for results.    ED Prescriptions    Medication Sig Dispense Auth. Provider   metroNIDAZOLE (FLAGYL) 500 MG tablet Take 1 tablet (500 mg total) by mouth 2 (two) times daily. 14 tablet [place] Paone A, NP     PDMP not reviewed this encounter.   Janace Aris, NP 04/28/20 [phone]

## 2020-04-29 ENCOUNTER — Telehealth (HOSPITAL_COMMUNITY): Payer: Self-pay | Admitting: Emergency Medicine

## 2020-04-29 ENCOUNTER — Ambulatory Visit (HOSPITAL_COMMUNITY)
Admission: EM | Admit: 2020-04-29 | Discharge: 2020-04-29 | Disposition: A | Discharge status: Home / Self Care | Payer: PRIVATE HEALTH INSURANCE | Attending: Internal Medicine | Admitting: Internal Medicine

## 2020-04-29 ENCOUNTER — Other Ambulatory Visit: Payer: Self-pay

## 2020-04-29 DIAGNOSIS — A549 Gonococcal infection, unspecified: Secondary | ICD-10-CM | POA: Diagnosis not present

## 2020-04-29 MED ORDER — LIDOCAINE HCL (PF) 1 % IJ SOLN
INTRAMUSCULAR | Status: AC
  Filled 2020-04-29: qty 2

## 2020-04-29 MED ORDER — FLUCONAZOLE 150 MG PO TABS: 150.0000 mg | ORAL_TABLET | Freq: Once | ORAL | 0 refills | Status: AC

## 2020-04-29 MED ORDER — CEFTRIAXONE SODIUM 500 MG IJ SOLR
INTRAMUSCULAR | Status: AC
  Filled 2020-04-29: qty 500

## 2020-04-29 MED ORDER — CEFTRIAXONE SODIUM 500 MG IJ SOLR
500.0000 mg | Freq: Once | INTRAMUSCULAR | Status: AC
  Administered 2020-04-29: 500 mg via INTRAMUSCULAR

## 2020-04-29 NOTE — ED Notes (Signed)
Patient was given education on treatment and sex education. Patient was told to wait 7 days before resuming intercourse. Patient was told to make sexual partners aware of test result.

## 2020-04-29 NOTE — ED Triage Notes (Signed)
Patient presents to [facility] for treatment of positive Gonorrhea with protocol IM Rocephin 500mg 

## 2020-06-12 ENCOUNTER — Other Ambulatory Visit: Payer: Self-pay

## 2020-06-12 ENCOUNTER — Encounter (HOSPITAL_COMMUNITY): Payer: Self-pay | Admitting: Emergency Medicine

## 2020-06-12 ENCOUNTER — Ambulatory Visit (HOSPITAL_COMMUNITY)
Admission: EM | Admit: 2020-06-12 | Discharge: 2020-06-12 | Disposition: A | Discharge status: Home / Self Care | Payer: PRIVATE HEALTH INSURANCE | Attending: Family Medicine | Admitting: Family Medicine

## 2020-06-12 DIAGNOSIS — N760 Acute vaginitis: Secondary | ICD-10-CM | POA: Diagnosis not present

## 2020-06-12 MED ORDER — METRONIDAZOLE 0.75 % VA GEL: 1.0000 | Freq: Two times a day (BID) | VAGINAL | 0 refills | Status: DC

## 2020-06-12 NOTE — ED Provider Notes (Signed)
[facility]    CSN: [id number] Arrival date & time: 06/12/20  1923      History   Chief Complaint Chief Complaint  Patient presents with  . Vaginal Itching    HPI Jane Martin is a 24 y.o. female.   Here today with 2-3 days of vaginal itching and irritation, vaginal discharge. Denies pelvic pain, fever, chills, N/V/D, exposures to STIs. Was treated 2 months ago for gonorrhea and BV but states she threw up all of her flagyl pills so does not think her BV was adequately treated.      History reviewed. No pertinent past medical history.  There are no problems to display for this patient.   History reviewed. No pertinent surgical history.  OB History   No obstetric history on file.      Home Medications    Prior to Admission medications   Medication Sig Start Date End Date Taking? Authorizing Provider  Cetirizine HCl 10 MG CAPS Take 1 capsule (10 mg total) by mouth daily. 10/15/19   Wieters, Hallie C, PA-C  clotrimazole (LOTRIMIN) 1 % cream Apply to affected area 2 times daily for up to one week. 02/17/19   Mardella Layman, MD  fexofenadine-pseudoephedrine (ALLEGRA-D) 60-120 MG 12 hr tablet Take 1 tablet by mouth every 12 (twelve) hours. 05/29/19   Lurline Idol, FNP  fluticasone (FLONASE) 50 MCG/ACT nasal spray Place 2 sprays into both nostrils daily. 05/29/19   Lurline Idol, FNP  metroNIDAZOLE (FLAGYL) 500 MG tablet Take 1 tablet (500 mg total) by mouth 2 (two) times daily. 04/25/20   Dahlia Byes A, NP  metroNIDAZOLE (METROGEL) 0.75 % vaginal gel Place 1 Applicatorful vaginally 2 (two) times daily. 06/12/20   Particia Nearing, PA-C    Family History Family History  Problem Relation Age of Onset  . Hypertension Mother   . Healthy Father     Social History Social History   Tobacco Use  . Smoking status: Never Smoker  . Smokeless tobacco: Never Used  Vaping Use  . Vaping Use: Never used  Substance Use Topics  . Alcohol use: Yes  .  Drug use: Yes    Types: Marijuana     Allergies   Flagyl [metronidazole]   Review of Systems Review of Systems PER HPI   Physical Exam Triage Vital Signs ED Triage Vitals  Enc Vitals Group     BP 06/12/20 2010 120/65     Pulse Rate 06/12/20 2010 81     Resp 06/12/20 2010 16     Temp 06/12/20 2010 98.1 F (36.7 C)     Temp Source 06/12/20 2010 Oral     SpO2 06/12/20 2010 100 %     Weight --      Height --      Head Circumference --      Peak Flow --      Pain Score 06/12/20 2009 0     Pain Loc --      Pain Edu? --      Excl. in GC? --    No data found.  Updated Vital Signs BP 120/65 (BP Location: Right Arm)   Pulse 81   Temp 98.1 F (36.7 C) (Oral)   Resp 16   LMP 05/18/2020   SpO2 100%   Visual Acuity Right Eye Distance:   Left Eye Distance:   Bilateral Distance:    Right Eye Near:   Left Eye Near:    Bilateral Near:  Physical Exam Vitals and nursing note reviewed.  Constitutional:      Appearance: Normal appearance. She is not ill-appearing.  HENT:     Head: Atraumatic.  Eyes:     Extraocular Movements: Extraocular movements intact.     Conjunctiva/sclera: Conjunctivae normal.  Cardiovascular:     Rate and Rhythm: Normal rate and regular rhythm.     Heart sounds: Normal heart sounds.  Pulmonary:     Effort: Pulmonary effort is normal.     Breath sounds: Normal breath sounds.  Genitourinary:    Comments: GU exam deferred, self swab performed Musculoskeletal:        General: Normal range of motion.     Cervical back: Normal range of motion and neck supple.  Skin:    General: Skin is warm and dry.  Neurological:     Mental Status: She is alert and oriented to person, place, and time.  Psychiatric:        Mood and Affect: Mood normal.        Thought Content: Thought content normal.        Judgment: Judgment normal.      UC Treatments / Results  Labs (all labs ordered are listed, but only abnormal results are displayed) Labs  Reviewed  CERVICOVAGINAL ANCILLARY ONLY    EKG   Radiology No results found.  Procedures Procedures (including critical care time)  Medications Ordered in UC Medications - No data to display  Initial Impression / Assessment and Plan / UC Course  I have reviewed the triage vital signs and the nursing notes.  Pertinent labs & imaging results that were available during my care of the patient were reviewed by me and considered in my medical decision making (see chart for details).     Aptima swab pending, given incomplete treatment with flagyl from last episode will start metrogel while awaiting results. Discussed boric acid, good vaginal hygiene, return precautions. Abstain from intercourse until results return.   Final Clinical Impressions(s) / UC Diagnoses   Final diagnoses:  Acute vaginitis   Discharge Instructions   None    ED Prescriptions    Medication Sig Dispense Auth. Provider   metroNIDAZOLE (METROGEL) 0.75 % vaginal gel Place 1 Applicatorful vaginally 2 (two) times daily. 140 g Particia Nearing, [place]     PDMP not reviewed this encounter.   Particia Nearing, [place] 06/12/20 2035

## 2020-06-12 NOTE — ED Triage Notes (Signed)
Pt presents with vaginal irritation and discharge xs 2 days. States has been given flagyl in the past but always gets sick after taking, even with food.

## 2020-06-13 ENCOUNTER — Telehealth (HOSPITAL_COMMUNITY): Payer: Self-pay | Admitting: Emergency Medicine

## 2020-06-13 LAB — CERVICOVAGINAL ANCILLARY ONLY
Bacterial Vaginitis (gardnerella): POSITIVE — AB
Candida Glabrata: POSITIVE — AB
Candida Vaginitis: POSITIVE — AB
Chlamydia: NEGATIVE
Comment: NEGATIVE
Comment: NEGATIVE
Comment: NEGATIVE
Comment: NEGATIVE
Comment: NEGATIVE
Comment: NORMAL
Neisseria Gonorrhea: NEGATIVE
Trichomonas: NEGATIVE

## 2020-06-13 MED ORDER — FLUCONAZOLE 150 MG PO TABS: 150.0000 mg | ORAL_TABLET | Freq: Once | ORAL | 0 refills | Status: AC

## 2020-09-13 ENCOUNTER — Encounter (HOSPITAL_COMMUNITY): Payer: Self-pay

## 2020-09-13 ENCOUNTER — Other Ambulatory Visit: Payer: Self-pay

## 2020-09-13 ENCOUNTER — Ambulatory Visit (HOSPITAL_COMMUNITY)
Admission: EM | Admit: 2020-09-13 | Discharge: 2020-09-13 | Disposition: A | Discharge status: Home / Self Care | Payer: PRIVATE HEALTH INSURANCE | Attending: Emergency Medicine | Admitting: Emergency Medicine

## 2020-09-13 DIAGNOSIS — N898 Other specified noninflammatory disorders of vagina: Secondary | ICD-10-CM | POA: Diagnosis not present

## 2020-09-13 DIAGNOSIS — Z3202 Encounter for pregnancy test, result negative: Secondary | ICD-10-CM | POA: Insufficient documentation

## 2020-09-13 LAB — POC URINE PREG, ED: Preg Test, Ur: NEGATIVE

## 2020-09-13 MED ORDER — FLUCONAZOLE 150 MG PO TABS: 150.0000 mg | ORAL_TABLET | Freq: Every day | ORAL | 0 refills | Status: DC

## 2020-09-13 NOTE — ED Triage Notes (Signed)
Pt in with c/o vaginal itching and irritation that has been going on for about 1 week now  States she feels like it may be a yeast infection  Denies any discharge

## 2020-09-13 NOTE — ED Provider Notes (Signed)
[facility]    CSN: [id number] Arrival date & time: 09/13/20  1150      History   Chief Complaint Chief Complaint  Patient presents with  . Vaginal Itching    HPI Jane Martin is a 25 y.o. female.   Patient presents with 1 week history of vaginal itching and irritation.  She believes it to be a yeast infection.  She denies vaginal discharge, pelvic pain, abdominal pain, dysuria, or other symptoms.  No treatments attempted at home.  Her medical history includes Candida vaginitis, bacterial vaginitis, gonorrhea.  The history is provided by the patient and medical records.    History reviewed. No pertinent past medical history.  There are no problems to display for this patient.   History reviewed. No pertinent surgical history.  OB History   No obstetric history on file.      Home Medications    Prior to Admission medications   Medication Sig Start Date End Date Taking? Authorizing Provider  fluconazole (DIFLUCAN) 150 MG tablet Take 1 tablet (150 mg total) by mouth daily. Take one tablet today.  May repeat in 3 days. 09/13/20  Yes Mickie Bail, NP  Cetirizine HCl 10 MG CAPS Take 1 capsule (10 mg total) by mouth daily. 10/15/19   Wieters, Hallie C, PA-C  clotrimazole (LOTRIMIN) 1 % cream Apply to affected area 2 times daily for up to one week. 02/17/19   Mardella Layman, MD  fexofenadine-pseudoephedrine (ALLEGRA-D) 60-120 MG 12 hr tablet Take 1 tablet by mouth every 12 (twelve) hours. 05/29/19   Lurline Idol, FNP  fluticasone (FLONASE) 50 MCG/ACT nasal spray Place 2 sprays into both nostrils daily. 05/29/19   Lurline Idol, FNP  metroNIDAZOLE (FLAGYL) 500 MG tablet Take 1 tablet (500 mg total) by mouth 2 (two) times daily. 04/25/20   Dahlia Byes A, NP  metroNIDAZOLE (METROGEL) 0.75 % vaginal gel Place 1 Applicatorful vaginally 2 (two) times daily. 06/12/20   Particia Nearing, PA-C    Family History Family History  Problem Relation Age of Onset   . Hypertension Mother   . Healthy Father     Social History Social History   Tobacco Use  . Smoking status: Never Smoker  . Smokeless tobacco: Never Used  Vaping Use  . Vaping Use: Never used  Substance Use Topics  . Alcohol use: Yes  . Drug use: Yes    Types: Marijuana     Allergies   Flagyl [metronidazole]   Review of Systems Review of Systems  Constitutional: Negative for chills and fever.  HENT: Negative for ear pain and sore throat.   Eyes: Negative for pain and visual disturbance.  Respiratory: Negative for cough and shortness of breath.   Cardiovascular: Negative for chest pain and palpitations.  Gastrointestinal: Negative for abdominal pain and vomiting.  Genitourinary: Negative for dysuria, flank pain, hematuria, pelvic pain and vaginal discharge.  Musculoskeletal: Negative for arthralgias and back pain.  Skin: Negative for color change and rash.  Neurological: Negative for seizures and syncope.  All other systems reviewed and are negative.    Physical Exam Triage Vital Signs ED Triage Vitals  Enc Vitals Group     BP      Pulse      Resp      Temp      Temp src      SpO2      Weight      Height      Head Circumference  Peak Flow      Pain Score      Pain Loc      Pain Edu?      Excl. in GC?    No data found.  Updated Vital Signs BP 109/74   Pulse 75   Temp 98.6 F (37 C)   Resp 18   LMP 08/18/2020 (Approximate)   SpO2 98%   Visual Acuity Right Eye Distance:   Left Eye Distance:   Bilateral Distance:    Right Eye Near:   Left Eye Near:    Bilateral Near:     Physical Exam Vitals and nursing note reviewed.  Constitutional:      General: She is not in acute distress.    Appearance: She is well-developed. She is not ill-appearing.  HENT:     Head: Normocephalic and atraumatic.     Mouth/Throat:     Mouth: Mucous membranes are moist.  Eyes:     Conjunctiva/sclera: Conjunctivae normal.  Cardiovascular:     Rate and  Rhythm: Normal rate and regular rhythm.     Heart sounds: Normal heart sounds.  Pulmonary:     Effort: Pulmonary effort is normal. No respiratory distress.     Breath sounds: Normal breath sounds.  Abdominal:     Palpations: Abdomen is soft.     Tenderness: There is no abdominal tenderness. There is no right CVA tenderness, left CVA tenderness, guarding or rebound.  Musculoskeletal:     Cervical back: Neck supple.  Skin:    General: Skin is warm and dry.  Neurological:     General: No focal deficit present.     Mental Status: She is alert and oriented to person, place, and time.     Gait: Gait normal.  Psychiatric:        Mood and Affect: Mood normal.        Behavior: Behavior normal.      UC Treatments / Results  Labs (all labs ordered are listed, but only abnormal results are displayed) Labs Reviewed  POC URINE PREG, ED  CERVICOVAGINAL ANCILLARY ONLY    EKG   Radiology No results found.  Procedures Procedures (including critical care time)  Medications Ordered in UC Medications - No data to display  Initial Impression / Assessment and Plan / UC Course  I have reviewed the triage vital signs and the nursing notes.  Pertinent labs & imaging results that were available during my care of the patient were reviewed by me and considered in my medical decision making (see chart for details).   Vaginal irritation, vaginal itching, negative pregnancy test.  Patient obtained vaginal self swab for testing.  Treating with Diflucan.  Discussed with patient that she may require additional treatment if her test results are positive.  Discussed that her sexual partner may also require treatment.  Instructed her to abstain from sexual activity for at least 7 days.  Instructed her to follow-up with her PCP or OB/GYN if her symptoms are not improving.  Patient agrees to plan of care.   Final Clinical Impressions(s) / UC Diagnoses   Final diagnoses:  Vaginal irritation  Vaginal  itching  Negative pregnancy test     Discharge Instructions     Your pregnancy test is negative.  Take the Diflucan as directed.  Your vaginal tests are pending.  If your test results are positive, we will call you.  You and your sexual partner(s) may require treatment at that time.  Do not have sexual  activity for at least 7 days.    Follow up with your primary care provider or gynecologist if your symptoms are not improving.         ED Prescriptions    Medication Sig Dispense Auth. Provider   fluconazole (DIFLUCAN) 150 MG tablet Take 1 tablet (150 mg total) by mouth daily. Take one tablet today.  May repeat in 3 days. 2 tablet Mickie Bail, NP     PDMP not reviewed this encounter.   Mickie Bail, NP 09/13/20 1230

## 2020-09-13 NOTE — Discharge Instructions (Signed)
Your pregnancy test is negative.  Take the Diflucan as directed.  Your vaginal tests are pending.  If your test results are positive, we will call you.  You and your sexual partner(s) may require treatment at that time.  Do not have sexual activity for at least 7 days.    Follow up with your primary care provider or gynecologist if your symptoms are not improving.

## 2020-09-14 LAB — CERVICOVAGINAL ANCILLARY ONLY
Bacterial Vaginitis (gardnerella): POSITIVE — AB
Candida Glabrata: NEGATIVE
Candida Vaginitis: POSITIVE — AB
Chlamydia: POSITIVE — AB
Comment: NEGATIVE
Comment: NEGATIVE
Comment: NEGATIVE
Comment: NEGATIVE
Comment: NEGATIVE
Comment: NORMAL
Neisseria Gonorrhea: POSITIVE — AB
Trichomonas: NEGATIVE

## 2020-09-15 ENCOUNTER — Telehealth (HOSPITAL_COMMUNITY): Payer: Self-pay | Admitting: Emergency Medicine

## 2020-09-15 ENCOUNTER — Other Ambulatory Visit: Payer: Self-pay

## 2020-09-15 ENCOUNTER — Ambulatory Visit (HOSPITAL_COMMUNITY)
Admission: EM | Admit: 2020-09-15 | Discharge: 2020-09-15 | Disposition: A | Discharge status: Home / Self Care | Payer: PRIVATE HEALTH INSURANCE | Attending: Emergency Medicine | Admitting: Emergency Medicine

## 2020-09-15 DIAGNOSIS — A64 Unspecified sexually transmitted disease: Secondary | ICD-10-CM

## 2020-09-15 MED ORDER — LIDOCAINE HCL (PF) 1 % IJ SOLN
INTRAMUSCULAR | Status: AC
  Filled 2020-09-15: qty 2

## 2020-09-15 MED ORDER — METRONIDAZOLE 0.75 % VA GEL: 1.0000 | Freq: Two times a day (BID) | VAGINAL | 0 refills | Status: AC

## 2020-09-15 MED ORDER — CEFTRIAXONE SODIUM 500 MG IJ SOLR
INTRAMUSCULAR | Status: AC
  Filled 2020-09-15: qty 500

## 2020-09-15 MED ORDER — CEFTRIAXONE SODIUM 500 MG IJ SOLR
500.0000 mg | Freq: Once | INTRAMUSCULAR | Status: AC
  Administered 2020-09-15: 500 mg via INTRAMUSCULAR

## 2020-09-15 MED ORDER — CLINDAMYCIN PHOSPHATE 2 % VA CREA: 1.0000 | TOPICAL_CREAM | Freq: Every day | VAGINAL | 0 refills | Status: DC

## 2020-09-15 MED ORDER — DOXYCYCLINE HYCLATE 100 MG PO CAPS
100.0000 mg | ORAL_CAPSULE | Freq: Two times a day (BID) | ORAL | 0 refills | Status: AC
Start: 2020-09-15 — End: 2020-09-22

## 2020-09-15 NOTE — ED Triage Notes (Signed)
Pt in for Std treatment after testing positive for chlamydia and gonorrhea

## 2020-09-16 NOTE — Progress Notes (Signed)
Patient is here for STI treatment after testing positive for gonorrhea and chlamydia. This was a nurse visit.

## 2020-10-07 DIAGNOSIS — I639 Cerebral infarction, unspecified: Secondary | ICD-10-CM

## 2020-10-07 HISTORY — DX: Cerebral infarction, unspecified: I63.9

## 2020-10-09 DIAGNOSIS — Z6827 Body mass index (BMI) 27.0-27.9, adult: Secondary | ICD-10-CM | POA: Diagnosis not present

## 2020-10-09 DIAGNOSIS — Z124 Encounter for screening for malignant neoplasm of cervix: Secondary | ICD-10-CM | POA: Diagnosis not present

## 2020-10-09 DIAGNOSIS — Z01411 Encounter for gynecological examination (general) (routine) with abnormal findings: Secondary | ICD-10-CM | POA: Diagnosis not present

## 2020-10-09 DIAGNOSIS — Z01419 Encounter for gynecological examination (general) (routine) without abnormal findings: Secondary | ICD-10-CM | POA: Diagnosis not present

## 2020-10-09 DIAGNOSIS — Z118 Encounter for screening for other infectious and parasitic diseases: Secondary | ICD-10-CM | POA: Diagnosis not present

## 2020-10-09 DIAGNOSIS — Z1159 Encounter for screening for other viral diseases: Secondary | ICD-10-CM | POA: Diagnosis not present

## 2020-10-09 DIAGNOSIS — Z114 Encounter for screening for human immunodeficiency virus [HIV]: Secondary | ICD-10-CM | POA: Diagnosis not present

## 2020-10-09 DIAGNOSIS — Z113 Encounter for screening for infections with a predominantly sexual mode of transmission: Secondary | ICD-10-CM | POA: Diagnosis not present

## 2020-10-17 ENCOUNTER — Emergency Department (HOSPITAL_COMMUNITY): Payer: 59

## 2020-10-17 ENCOUNTER — Inpatient Hospital Stay (HOSPITAL_COMMUNITY)
Admission: EM | Admit: 2020-10-17 | Discharge: 2020-10-30 | DRG: 023 | Disposition: A | Discharge status: Rehabilitation Facility | Payer: 59 | Attending: Internal Medicine | Admitting: Internal Medicine

## 2020-10-17 ENCOUNTER — Emergency Department (HOSPITAL_COMMUNITY): Payer: 59 | Admitting: Registered Nurse

## 2020-10-17 ENCOUNTER — Inpatient Hospital Stay (HOSPITAL_COMMUNITY)
Admission: EM | Disposition: A | Discharge status: Rehabilitation Facility | Payer: Self-pay | Source: Home / Self Care | Attending: Neurology

## 2020-10-17 ENCOUNTER — Other Ambulatory Visit: Payer: Self-pay

## 2020-10-17 DIAGNOSIS — I611 Nontraumatic intracerebral hemorrhage in hemisphere, cortical: Secondary | ICD-10-CM | POA: Diagnosis not present

## 2020-10-17 DIAGNOSIS — I615 Nontraumatic intracerebral hemorrhage, intraventricular: Principal | ICD-10-CM | POA: Diagnosis present

## 2020-10-17 DIAGNOSIS — R55 Syncope and collapse: Secondary | ICD-10-CM | POA: Diagnosis not present

## 2020-10-17 DIAGNOSIS — J9601 Acute respiratory failure with hypoxia: Secondary | ICD-10-CM | POA: Diagnosis present

## 2020-10-17 DIAGNOSIS — I10 Essential (primary) hypertension: Secondary | ICD-10-CM | POA: Diagnosis present

## 2020-10-17 DIAGNOSIS — F17290 Nicotine dependence, other tobacco product, uncomplicated: Secondary | ICD-10-CM | POA: Diagnosis present

## 2020-10-17 DIAGNOSIS — R7401 Elevation of levels of liver transaminase levels: Secondary | ICD-10-CM | POA: Diagnosis present

## 2020-10-17 DIAGNOSIS — W19XXXA Unspecified fall, initial encounter: Secondary | ICD-10-CM

## 2020-10-17 DIAGNOSIS — R4182 Altered mental status, unspecified: Secondary | ICD-10-CM | POA: Diagnosis not present

## 2020-10-17 DIAGNOSIS — I619 Nontraumatic intracerebral hemorrhage, unspecified: Secondary | ICD-10-CM | POA: Diagnosis not present

## 2020-10-17 DIAGNOSIS — D62 Acute posthemorrhagic anemia: Secondary | ICD-10-CM | POA: Diagnosis not present

## 2020-10-17 DIAGNOSIS — I618 Other nontraumatic intracerebral hemorrhage: Secondary | ICD-10-CM | POA: Diagnosis not present

## 2020-10-17 DIAGNOSIS — E876 Hypokalemia: Secondary | ICD-10-CM | POA: Diagnosis not present

## 2020-10-17 DIAGNOSIS — G8191 Hemiplegia, unspecified affecting right dominant side: Secondary | ICD-10-CM | POA: Diagnosis present

## 2020-10-17 DIAGNOSIS — I6389 Other cerebral infarction: Secondary | ICD-10-CM | POA: Diagnosis not present

## 2020-10-17 DIAGNOSIS — E872 Acidosis: Secondary | ICD-10-CM | POA: Diagnosis present

## 2020-10-17 DIAGNOSIS — I499 Cardiac arrhythmia, unspecified: Secondary | ICD-10-CM | POA: Diagnosis not present

## 2020-10-17 DIAGNOSIS — Z0189 Encounter for other specified special examinations: Secondary | ICD-10-CM

## 2020-10-17 DIAGNOSIS — D689 Coagulation defect, unspecified: Secondary | ICD-10-CM | POA: Diagnosis not present

## 2020-10-17 DIAGNOSIS — U071 COVID-19: Secondary | ICD-10-CM | POA: Diagnosis not present

## 2020-10-17 DIAGNOSIS — Z4682 Encounter for fitting and adjustment of non-vascular catheter: Secondary | ICD-10-CM | POA: Diagnosis not present

## 2020-10-17 DIAGNOSIS — J9620 Acute and chronic respiratory failure, unspecified whether with hypoxia or hypercapnia: Secondary | ICD-10-CM | POA: Diagnosis not present

## 2020-10-17 DIAGNOSIS — G936 Cerebral edema: Secondary | ICD-10-CM | POA: Diagnosis not present

## 2020-10-17 DIAGNOSIS — E873 Alkalosis: Secondary | ICD-10-CM | POA: Diagnosis not present

## 2020-10-17 DIAGNOSIS — R220 Localized swelling, mass and lump, head: Secondary | ICD-10-CM | POA: Diagnosis not present

## 2020-10-17 DIAGNOSIS — R0902 Hypoxemia: Secondary | ICD-10-CM | POA: Diagnosis not present

## 2020-10-17 DIAGNOSIS — R404 Transient alteration of awareness: Secondary | ICD-10-CM | POA: Diagnosis not present

## 2020-10-17 DIAGNOSIS — R339 Retention of urine, unspecified: Secondary | ICD-10-CM | POA: Diagnosis present

## 2020-10-17 DIAGNOSIS — R4701 Aphasia: Secondary | ICD-10-CM | POA: Diagnosis present

## 2020-10-17 DIAGNOSIS — R410 Disorientation, unspecified: Secondary | ICD-10-CM | POA: Diagnosis not present

## 2020-10-17 DIAGNOSIS — W19XXXD Unspecified fall, subsequent encounter: Secondary | ICD-10-CM | POA: Diagnosis not present

## 2020-10-17 DIAGNOSIS — Z781 Physical restraint status: Secondary | ICD-10-CM

## 2020-10-17 DIAGNOSIS — J9600 Acute respiratory failure, unspecified whether with hypoxia or hypercapnia: Secondary | ICD-10-CM | POA: Diagnosis not present

## 2020-10-17 DIAGNOSIS — I609 Nontraumatic subarachnoid hemorrhage, unspecified: Secondary | ICD-10-CM | POA: Diagnosis not present

## 2020-10-17 DIAGNOSIS — R0689 Other abnormalities of breathing: Secondary | ICD-10-CM | POA: Diagnosis not present

## 2020-10-17 DIAGNOSIS — I639 Cerebral infarction, unspecified: Secondary | ICD-10-CM | POA: Diagnosis not present

## 2020-10-17 DIAGNOSIS — G463 Brain stem stroke syndrome: Secondary | ICD-10-CM | POA: Diagnosis not present

## 2020-10-17 DIAGNOSIS — R1310 Dysphagia, unspecified: Secondary | ICD-10-CM | POA: Diagnosis present

## 2020-10-17 DIAGNOSIS — G935 Compression of brain: Secondary | ICD-10-CM | POA: Diagnosis present

## 2020-10-17 DIAGNOSIS — G9340 Encephalopathy, unspecified: Secondary | ICD-10-CM | POA: Diagnosis present

## 2020-10-17 DIAGNOSIS — J3489 Other specified disorders of nose and nasal sinuses: Secondary | ICD-10-CM | POA: Diagnosis not present

## 2020-10-17 DIAGNOSIS — I612 Nontraumatic intracerebral hemorrhage in hemisphere, unspecified: Secondary | ICD-10-CM | POA: Diagnosis not present

## 2020-10-17 DIAGNOSIS — G9389 Other specified disorders of brain: Secondary | ICD-10-CM | POA: Diagnosis not present

## 2020-10-17 DIAGNOSIS — J320 Chronic maxillary sinusitis: Secondary | ICD-10-CM | POA: Diagnosis not present

## 2020-10-17 DIAGNOSIS — Z9889 Other specified postprocedural states: Secondary | ICD-10-CM | POA: Diagnosis not present

## 2020-10-17 DIAGNOSIS — R531 Weakness: Secondary | ICD-10-CM | POA: Diagnosis not present

## 2020-10-17 DIAGNOSIS — R1312 Dysphagia, oropharyngeal phase: Secondary | ICD-10-CM | POA: Diagnosis not present

## 2020-10-17 DIAGNOSIS — R001 Bradycardia, unspecified: Secondary | ICD-10-CM | POA: Diagnosis not present

## 2020-10-17 DIAGNOSIS — R4020 Unspecified coma: Secondary | ICD-10-CM | POA: Diagnosis not present

## 2020-10-17 HISTORY — PX: CRANIOTOMY: SHX93

## 2020-10-17 LAB — I-STAT CHEM 8, ED
BUN: 7 mg/dL (ref 6–20)
Calcium, Ion: 1.22 mmol/L (ref 1.15–1.40)
Chloride: 104 mmol/L (ref 98–111)
Creatinine, Ser: 0.7 mg/dL (ref 0.44–1.00)
Glucose, Bld: 156 mg/dL — ABNORMAL HIGH (ref 70–99)
HCT: 42 % (ref 36.0–46.0)
Hemoglobin: 14.3 g/dL (ref 12.0–15.0)
Potassium: 3 mmol/L — ABNORMAL LOW (ref 3.5–5.1)
Sodium: 140 mmol/L (ref 135–145)
TCO2: 21 mmol/L — ABNORMAL LOW (ref 22–32)

## 2020-10-17 LAB — COMPREHENSIVE METABOLIC PANEL
ALT: 12 U/L (ref 0–44)
AST: 20 U/L (ref 15–41)
Albumin: 4.5 g/dL (ref 3.5–5.0)
Alkaline Phosphatase: 56 U/L (ref 38–126)
Anion gap: 10 (ref 5–15)
BUN: 7 mg/dL (ref 6–20)
CO2: 19 mmol/L — ABNORMAL LOW (ref 22–32)
Calcium: 9.3 mg/dL (ref 8.9–10.3)
Chloride: 107 mmol/L (ref 98–111)
Creatinine, Ser: 0.81 mg/dL (ref 0.44–1.00)
GFR, Estimated: >60 mL/min (ref >60)
Glucose, Bld: 156 mg/dL — ABNORMAL HIGH (ref 70–99)
Potassium: 3 mmol/L — ABNORMAL LOW (ref 3.5–5.1)
Sodium: 136 mmol/L (ref 135–145)
Total Bilirubin: 0.8 mg/dL (ref 0.3–1.2)
Total Protein: 7.6 g/dL (ref 6.5–8.1)

## 2020-10-17 LAB — TYPE AND SCREEN
ABO/RH(D): B NEG
Antibody Screen: NEGATIVE

## 2020-10-17 LAB — CBC WITH DIFFERENTIAL/PLATELET
Abs Immature Granulocytes: 0.02 10*3/uL (ref 0.00–0.07)
Basophils Absolute: 0.1 10*3/uL (ref 0.0–0.1)
Basophils Relative: 1 %
Eosinophils Absolute: 0 10*3/uL (ref 0.0–0.5)
Eosinophils Relative: 0 %
HCT: 41.7 % (ref 36.0–46.0)
Hemoglobin: 13.8 g/dL (ref 12.0–15.0)
Immature Granulocytes: 0 %
Lymphocytes Relative: 62 %
Lymphs Abs: 3.9 10*3/uL (ref 0.7–4.0)
MCH: 30.9 pg (ref 26.0–34.0)
MCHC: 33.1 g/dL (ref 30.0–36.0)
MCV: 93.3 fL (ref 80.0–100.0)
Monocytes Absolute: 0.5 10*3/uL (ref 0.1–1.0)
Monocytes Relative: 8 %
Neutro Abs: 1.8 10*3/uL (ref 1.7–7.7)
Neutrophils Relative %: 29 %
Platelets: 250 10*3/uL (ref 150–400)
RBC: 4.47 MIL/uL (ref 3.87–5.11)
RDW: 12 % (ref 11.5–15.5)
WBC: 6.3 10*3/uL (ref 4.0–10.5)
nRBC: 0 % (ref 0.0–0.2)

## 2020-10-17 LAB — RESP PANEL BY RT-PCR (FLU A&B, COVID) ARPGX2
Influenza A by PCR: NEGATIVE
Influenza B by PCR: NEGATIVE
SARS Coronavirus 2 by RT PCR: POSITIVE — AB

## 2020-10-17 LAB — ETHANOL: Alcohol, Ethyl (B): <10 mg/dL (ref <10)

## 2020-10-17 LAB — PROTIME-INR
INR: 1.3 — ABNORMAL HIGH (ref 0.8–1.2)
Prothrombin Time: 16 seconds — ABNORMAL HIGH (ref 11.4–15.2)

## 2020-10-17 LAB — HIV ANTIBODY (ROUTINE TESTING W REFLEX): HIV Screen 4th Generation wRfx: NONREACTIVE

## 2020-10-17 LAB — LACTIC ACID, PLASMA: Lactic Acid, Venous: 5.3 mmol/L (ref 0.5–1.9)

## 2020-10-17 LAB — ABO/RH: ABO/RH(D): B NEG

## 2020-10-17 LAB — CBG MONITORING, ED: Glucose-Capillary: 153 mg/dL — ABNORMAL HIGH (ref 70–99)

## 2020-10-17 SURGERY — CRANIOTOMY HEMATOMA EVACUATION SUBDURAL
Anesthesia: General | Site: Head | Laterality: Left

## 2020-10-17 MED ORDER — POLYETHYLENE GLYCOL 3350 17 G PO PACK
17.0000 g | PACK | Freq: Every day | ORAL | Status: DC
  Administered 2020-10-18 – 2020-10-24 (×4): 17 g
  Filled 2020-10-17 (×5): qty 1

## 2020-10-17 MED ORDER — BUPIVACAINE HCL (PF) 0.25 % IJ SOLN
INTRAMUSCULAR | Status: AC
  Filled 2020-10-17: qty 30

## 2020-10-17 MED ORDER — IOHEXOL 350 MG/ML SOLN
75.0000 mL | Freq: Once | INTRAVENOUS | Status: AC | PRN
  Administered 2020-10-17: 75 mL via INTRAVENOUS

## 2020-10-17 MED ORDER — EPHEDRINE SULFATE 50 MG/ML IJ SOLN
INTRAMUSCULAR | Status: DC | PRN
  Administered 2020-10-17: 5 mg via INTRAVENOUS

## 2020-10-17 MED ORDER — CHLORHEXIDINE GLUCONATE 0.12% ORAL RINSE (MEDLINE KIT)
15.0000 mL | Freq: Two times a day (BID) | OROMUCOSAL | Status: DC
  Administered 2020-10-18 – 2020-10-20 (×5): 15 mL via OROMUCOSAL

## 2020-10-17 MED ORDER — LABETALOL HCL 5 MG/ML IV SOLN
10.0000 mg | INTRAVENOUS | Status: DC | PRN
Start: 2020-10-17 — End: 2020-10-21
  Administered 2020-10-17 – 2020-10-18 (×4): 20 mg via INTRAVENOUS
  Filled 2020-10-17 (×3): qty 8

## 2020-10-17 MED ORDER — FENTANYL CITRATE (PF) 100 MCG/2ML IJ SOLN
INTRAMUSCULAR | Status: AC
  Filled 2020-10-17: qty 2

## 2020-10-17 MED ORDER — LEVETIRACETAM IN NACL 500 MG/100ML IV SOLN
500.0000 mg | Freq: Two times a day (BID) | INTRAVENOUS | Status: DC
  Administered 2020-10-18 – 2020-10-22 (×10): 500 mg via INTRAVENOUS
  Filled 2020-10-17 (×9): qty 100

## 2020-10-17 MED ORDER — FENTANYL CITRATE (PF) 100 MCG/2ML IJ SOLN
50.0000 ug | Freq: Once | INTRAMUSCULAR | Status: AC
Start: 2020-10-17 — End: 2020-10-18
  Administered 2020-10-18: 50 ug via INTRAVENOUS
  Filled 2020-10-17: qty 2

## 2020-10-17 MED ORDER — PROPOFOL 10 MG/ML IV BOLUS
INTRAVENOUS | Status: AC
  Filled 2020-10-17: qty 20

## 2020-10-17 MED ORDER — MICROFIBRILLAR COLL HEMOSTAT EX PADS
MEDICATED_PAD | CUTANEOUS | Status: DC | PRN
  Administered 2020-10-17: 1 via TOPICAL

## 2020-10-17 MED ORDER — SODIUM CHLORIDE 0.9 % IV SOLN: INTRAVENOUS | Status: DC | PRN

## 2020-10-17 MED ORDER — HYDRALAZINE HCL 20 MG/ML IJ SOLN
5.0000 mg | INTRAMUSCULAR | Status: DC | PRN
  Administered 2020-10-17: 20 mg via INTRAVENOUS
  Filled 2020-10-17: qty 1

## 2020-10-17 MED ORDER — 0.9 % SODIUM CHLORIDE (POUR BTL) OPTIME
TOPICAL | Status: DC | PRN
  Administered 2020-10-17 (×2): 1000 mL

## 2020-10-17 MED ORDER — ACETAMINOPHEN 325 MG PO TABS
650.0000 mg | ORAL_TABLET | ORAL | Status: DC | PRN
  Administered 2020-10-23: 650 mg via ORAL
  Filled 2020-10-17: qty 2

## 2020-10-17 MED ORDER — PHENYLEPHRINE HCL-NACL 10-0.9 MG/250ML-% IV SOLN
INTRAVENOUS | Status: DC | PRN
  Administered 2020-10-17: 25 ug/min via INTRAVENOUS

## 2020-10-17 MED ORDER — VANCOMYCIN HCL 1000 MG IV SOLR
INTRAVENOUS | Status: AC
  Filled 2020-10-17: qty 1000

## 2020-10-17 MED ORDER — THROMBIN 5000 UNITS EX SOLR
OROMUCOSAL | Status: DC | PRN
  Administered 2020-10-17 (×3): 5 mL via TOPICAL

## 2020-10-17 MED ORDER — THROMBIN 5000 UNITS EX SOLR
CUTANEOUS | Status: AC
  Filled 2020-10-17: qty 10000

## 2020-10-17 MED ORDER — FENTANYL CITRATE (PF) 100 MCG/2ML IJ SOLN
50.0000 ug | Freq: Once | INTRAMUSCULAR | Status: AC
  Administered 2020-10-17: 50 ug via INTRAVENOUS

## 2020-10-17 MED ORDER — SUCCINYLCHOLINE CHLORIDE 20 MG/ML IJ SOLN
INTRAMUSCULAR | Status: AC | PRN
  Administered 2020-10-17: 100 mg via INTRAVENOUS

## 2020-10-17 MED ORDER — SENNA 8.6 MG PO TABS: 1.0000 | ORAL_TABLET | Freq: Two times a day (BID) | ORAL | Status: DC

## 2020-10-17 MED ORDER — EPINEPHRINE 1 MG/10ML IJ SOSY
PREFILLED_SYRINGE | INTRAMUSCULAR | Status: AC
  Filled 2020-10-17: qty 10

## 2020-10-17 MED ORDER — POVIDONE-IODINE 10 % EX OINT
TOPICAL_OINTMENT | CUTANEOUS | Status: AC
  Filled 2020-10-17: qty 28.35

## 2020-10-17 MED ORDER — CEFAZOLIN SODIUM 1 G IJ SOLR
INTRAMUSCULAR | Status: AC
  Filled 2020-10-17: qty 20

## 2020-10-17 MED ORDER — FENTANYL CITRATE (PF) 100 MCG/2ML IJ SOLN
50.0000 ug | INTRAMUSCULAR | Status: DC | PRN
Start: 2020-10-17 — End: 2020-10-17

## 2020-10-17 MED ORDER — NICARDIPINE HCL IN NACL 20-0.86 MG/200ML-% IV SOLN
3.0000 mg/h | INTRAVENOUS | Status: DC
  Administered 2020-10-17: 5 mg/h via INTRAVENOUS
  Administered 2020-10-18: 10 mg/h via INTRAVENOUS
  Administered 2020-10-18: 15 mg/h via INTRAVENOUS
  Administered 2020-10-18: 10 mg/h via INTRAVENOUS
  Administered 2020-10-18 (×2): 15 mg/h via INTRAVENOUS
  Filled 2020-10-17: qty 200
  Filled 2020-10-17: qty 400
  Filled 2020-10-17: qty 200
  Filled 2020-10-17: qty 400
  Filled 2020-10-17: qty 200

## 2020-10-17 MED ORDER — ROCURONIUM BROMIDE 10 MG/ML (PF) SYRINGE
PREFILLED_SYRINGE | INTRAVENOUS | Status: AC
  Filled 2020-10-17: qty 10

## 2020-10-17 MED ORDER — STROKE: EARLY STAGES OF RECOVERY BOOK
Freq: Once | Status: DC
  Filled 2020-10-17: qty 1

## 2020-10-17 MED ORDER — THROMBIN 20000 UNITS EX SOLR
CUTANEOUS | Status: DC | PRN
  Administered 2020-10-17: 20 mL via TOPICAL

## 2020-10-17 MED ORDER — SENNOSIDES 8.8 MG/5ML PO SYRP
5.0000 mL | ORAL_SOLUTION | Freq: Two times a day (BID) | ORAL | Status: DC
  Administered 2020-10-18 – 2020-10-23 (×10): 5 mL
  Filled 2020-10-17 (×18): qty 5

## 2020-10-17 MED ORDER — PANTOPRAZOLE SODIUM 40 MG IV SOLR: 40.0000 mg | Freq: Every day | INTRAVENOUS | Status: DC

## 2020-10-17 MED ORDER — THROMBIN 5000 UNITS EX SOLR
CUTANEOUS | Status: AC
  Filled 2020-10-17: qty 5000

## 2020-10-17 MED ORDER — ONDANSETRON HCL 4 MG/2ML IJ SOLN: 4.0000 mg | INTRAMUSCULAR | Status: DC | PRN

## 2020-10-17 MED ORDER — VASOPRESSIN 20 UNIT/ML IV SOLN
INTRAVENOUS | Status: AC
  Filled 2020-10-17: qty 1

## 2020-10-17 MED ORDER — DOCUSATE SODIUM 50 MG/5ML PO LIQD
100.0000 mg | Freq: Two times a day (BID) | ORAL | Status: DC
  Administered 2020-10-18 – 2020-10-20 (×6): 100 mg
  Filled 2020-10-17 (×6): qty 10

## 2020-10-17 MED ORDER — LEVETIRACETAM IN NACL 500 MG/100ML IV SOLN
500.0000 mg | Freq: Two times a day (BID) | INTRAVENOUS | Status: DC
  Administered 2020-10-17: 500 mg via INTRAVENOUS
  Filled 2020-10-17 (×2): qty 100

## 2020-10-17 MED ORDER — PANTOPRAZOLE SODIUM 40 MG IV SOLR
40.0000 mg | Freq: Every day | INTRAVENOUS | Status: DC
  Administered 2020-10-18 – 2020-10-19 (×2): 40 mg via INTRAVENOUS
  Filled 2020-10-17 (×3): qty 40

## 2020-10-17 MED ORDER — ONDANSETRON HCL 4 MG PO TABS: 4.0000 mg | ORAL_TABLET | ORAL | Status: DC | PRN

## 2020-10-17 MED ORDER — EPHEDRINE 5 MG/ML INJ
INTRAVENOUS | Status: AC
  Filled 2020-10-17: qty 10

## 2020-10-17 MED ORDER — FENTANYL CITRATE (PF) 100 MCG/2ML IJ SOLN: 50.0000 ug | INTRAMUSCULAR | Status: DC | PRN

## 2020-10-17 MED ORDER — PROPOFOL 500 MG/50ML IV EMUL
INTRAVENOUS | Status: DC | PRN
  Administered 2020-10-17: 25 ug/kg/min via INTRAVENOUS

## 2020-10-17 MED ORDER — ALBUMIN HUMAN 5 % IV SOLN: INTRAVENOUS | Status: DC | PRN

## 2020-10-17 MED ORDER — BISACODYL 10 MG RE SUPP: 10.0000 mg | Freq: Every day | RECTAL | Status: DC | PRN

## 2020-10-17 MED ORDER — ROCURONIUM BROMIDE 10 MG/ML (PF) SYRINGE
PREFILLED_SYRINGE | INTRAVENOUS | Status: DC | PRN
  Administered 2020-10-17: 20 mg via INTRAVENOUS
  Administered 2020-10-17: 80 mg via INTRAVENOUS
  Administered 2020-10-17 (×2): 20 mg via INTRAVENOUS

## 2020-10-17 MED ORDER — FENTANYL CITRATE (PF) 250 MCG/5ML IJ SOLN
INTRAMUSCULAR | Status: DC | PRN
  Administered 2020-10-17 (×3): 100 ug via INTRAVENOUS
  Administered 2020-10-17: 50 ug via INTRAVENOUS
  Administered 2020-10-17: 100 ug via INTRAVENOUS
  Administered 2020-10-17: 50 ug via INTRAVENOUS

## 2020-10-17 MED ORDER — PROPOFOL 1000 MG/100ML IV EMUL
INTRAVENOUS | Status: AC
  Administered 2020-10-17: 60 ug/kg/min
  Filled 2020-10-17: qty 100

## 2020-10-17 MED ORDER — ETOMIDATE 2 MG/ML IV SOLN
INTRAVENOUS | Status: AC | PRN
  Administered 2020-10-17: 20 mg via INTRAVENOUS

## 2020-10-17 MED ORDER — POLYETHYLENE GLYCOL 3350 17 G PO PACK
17.0000 g | PACK | Freq: Every day | ORAL | Status: DC | PRN
  Administered 2020-10-22: 17 g

## 2020-10-17 MED ORDER — CEFAZOLIN SODIUM-DEXTROSE 2-3 GM-%(50ML) IV SOLR
INTRAVENOUS | Status: DC | PRN
  Administered 2020-10-17: 2 g via INTRAVENOUS

## 2020-10-17 MED ORDER — PHENYLEPHRINE 40 MCG/ML (10ML) SYRINGE FOR IV PUSH (FOR BLOOD PRESSURE SUPPORT)
PREFILLED_SYRINGE | INTRAVENOUS | Status: AC
  Filled 2020-10-17: qty 10

## 2020-10-17 MED ORDER — PHENYLEPHRINE 40 MCG/ML (10ML) SYRINGE FOR IV PUSH (FOR BLOOD PRESSURE SUPPORT)
PREFILLED_SYRINGE | INTRAVENOUS | Status: DC | PRN
  Administered 2020-10-17 (×3): 80 ug via INTRAVENOUS

## 2020-10-17 MED ORDER — THROMBIN 20000 UNITS EX KIT
PACK | CUTANEOUS | Status: AC
  Filled 2020-10-17: qty 1

## 2020-10-17 MED ORDER — BUPIVACAINE HCL (PF) 0.25 % IJ SOLN
INTRAMUSCULAR | Status: DC | PRN
  Administered 2020-10-17: 5 mL

## 2020-10-17 MED ORDER — DOCUSATE SODIUM 100 MG PO CAPS: 100.0000 mg | ORAL_CAPSULE | Freq: Two times a day (BID) | ORAL | Status: DC

## 2020-10-17 MED ORDER — LIDOCAINE-EPINEPHRINE 1 %-1:100000 IJ SOLN
INTRAMUSCULAR | Status: AC
  Filled 2020-10-17: qty 1

## 2020-10-17 MED ORDER — SODIUM CHLORIDE 0.9 % IV SOLN
INTRAVENOUS | Status: DC | PRN
  Administered 2020-10-17: 500 mg via INTRAVENOUS

## 2020-10-17 MED ORDER — POTASSIUM CHLORIDE 10 MEQ/100ML IV SOLN
10.0000 meq | INTRAVENOUS | Status: AC
  Administered 2020-10-18: 10 meq via INTRAVENOUS
  Filled 2020-10-17: qty 100

## 2020-10-17 MED ORDER — LIDOCAINE-EPINEPHRINE 1 %-1:100000 IJ SOLN
INTRAMUSCULAR | Status: DC | PRN
  Administered 2020-10-17: 5 mL via INTRADERMAL

## 2020-10-17 MED ORDER — MANNITOL 20 % IV SOLN
70.0000 g | Freq: Once | Status: AC
  Administered 2020-10-17: 70 g via INTRAVENOUS
  Filled 2020-10-17: qty 350

## 2020-10-17 MED ORDER — ONDANSETRON HCL 4 MG/2ML IJ SOLN
4.0000 mg | INTRAMUSCULAR | Status: DC | PRN
Start: 2020-10-17 — End: 2020-10-30

## 2020-10-17 MED ORDER — ACETAMINOPHEN 650 MG RE SUPP: 650.0000 mg | RECTAL | Status: DC | PRN

## 2020-10-17 MED ORDER — ACETAMINOPHEN 160 MG/5ML PO SOLN
650.0000 mg | ORAL | Status: DC | PRN
  Administered 2020-10-22 – 2020-10-25 (×5): 650 mg
  Filled 2020-10-17 (×5): qty 20.3

## 2020-10-17 MED ORDER — FENTANYL CITRATE (PF) 250 MCG/5ML IJ SOLN
INTRAMUSCULAR | Status: AC
  Filled 2020-10-17: qty 5

## 2020-10-17 MED ORDER — ATROPINE SULFATE 1 MG/10ML IJ SOSY
PREFILLED_SYRINGE | INTRAMUSCULAR | Status: AC
  Filled 2020-10-17: qty 10

## 2020-10-17 MED ORDER — SODIUM BICARBONATE 8.4 % IV SOLN
INTRAVENOUS | Status: DC | PRN
  Administered 2020-10-17: 50 meq via INTRAVENOUS

## 2020-10-17 MED ORDER — SENNOSIDES-DOCUSATE SODIUM 8.6-50 MG PO TABS: 1.0000 | ORAL_TABLET | Freq: Two times a day (BID) | ORAL | Status: DC

## 2020-10-17 MED ORDER — FENTANYL BOLUS VIA INFUSION
50.0000 ug | INTRAVENOUS | Status: DC | PRN
  Administered 2020-10-18 (×5): 50 ug via INTRAVENOUS
  Administered 2020-10-19: 100 ug via INTRAVENOUS
  Administered 2020-10-19 (×2): 50 ug via INTRAVENOUS
  Filled 2020-10-17: qty 100

## 2020-10-17 MED ORDER — ORAL CARE MOUTH RINSE
15.0000 mL | OROMUCOSAL | Status: DC
  Administered 2020-10-18 – 2020-10-20 (×24): 15 mL via OROMUCOSAL

## 2020-10-17 MED ORDER — FLEET ENEMA 7-19 GM/118ML RE ENEM: 1.0000 | ENEMA | Freq: Once | RECTAL | Status: DC | PRN

## 2020-10-17 MED ORDER — PROPOFOL 1000 MG/100ML IV EMUL
0.0000 ug/kg/min | INTRAVENOUS | Status: DC
  Administered 2020-10-18 (×3): 30 ug/kg/min via INTRAVENOUS
  Administered 2020-10-19 (×3): 40 ug/kg/min via INTRAVENOUS
  Administered 2020-10-19: 5 ug/kg/min via INTRAVENOUS
  Filled 2020-10-17 (×7): qty 100

## 2020-10-17 MED ORDER — LIDOCAINE 2% (20 MG/ML) 5 ML SYRINGE
INTRAMUSCULAR | Status: AC
  Filled 2020-10-17: qty 5

## 2020-10-17 MED ORDER — FENTANYL 2500MCG IN NS 250ML (10MCG/ML) PREMIX INFUSION
0.0000 ug/h | INTRAVENOUS | Status: DC
  Administered 2020-10-18: 50 ug/h via INTRAVENOUS
  Administered 2020-10-19: 150 ug/h via INTRAVENOUS
  Administered 2020-10-19 (×2): 100 ug/h via INTRAVENOUS
  Administered 2020-10-20: 200 ug/h via INTRAVENOUS
  Filled 2020-10-17 (×4): qty 250

## 2020-10-17 MED ORDER — PROPOFOL 10 MG/ML IV BOLUS
INTRAVENOUS | Status: DC | PRN
  Administered 2020-10-17: 50 mg via INTRAVENOUS

## 2020-10-17 MED ORDER — PROMETHAZINE HCL 25 MG PO TABS
12.5000 mg | ORAL_TABLET | ORAL | Status: DC | PRN
  Filled 2020-10-17: qty 1

## 2020-10-17 SURGICAL SUPPLY — 67 items
BASKET BONE COLLECTION (BASKET) IMPLANT
BATTERY IQ STERILE (MISCELLANEOUS) IMPLANT
BIT DRILL WIRE PASS 1.3MM (BIT) IMPLANT
BNDG GAUZE ELAST 4 BULKY (GAUZE/BANDAGES/DRESSINGS) ×2 IMPLANT
BTRY SRG DRVR 1.5 IQ (MISCELLANEOUS)
BUR ACORN 6.0 PRECISION (BURR) ×2 IMPLANT
BUR SPIRAL ROUTER 2.3 (BUR) ×2 IMPLANT
CANISTER SUCT 3000ML PPV (MISCELLANEOUS) ×2 IMPLANT
CATH VENTRIC 35X38 W/TROCAR LG (CATHETERS) ×2 IMPLANT
CLIP VESOCCLUDE MED 6/CT (CLIP) IMPLANT
COVER BURR HOLE UNIV 10 (Orthopedic Implant) ×2 IMPLANT
COVER WAND RF STERILE (DRAPES) ×2 IMPLANT
DECANTER SPIKE VIAL GLASS SM (MISCELLANEOUS) ×2 IMPLANT
DRAIN CHANNEL 10M FLAT 3/4 FLT (DRAIN) IMPLANT
DRAIN PENROSE 1/2X12 LTX STRL (WOUND CARE) IMPLANT
DRAPE SURG IRRIG POUCH 19X23 (DRAPES) ×2 IMPLANT
DRAPE WARM FLUID 44X44 (DRAPES) ×2 IMPLANT
DRILL WIRE PASS 1.3MM (BIT)
DRSG ADAPTIC 3X8 NADH LF (GAUZE/BANDAGES/DRESSINGS) IMPLANT
DRSG OPSITE POSTOP 3X4 (GAUZE/BANDAGES/DRESSINGS) ×4 IMPLANT
DRSG PAD ABDOMINAL 8X10 ST (GAUZE/BANDAGES/DRESSINGS) IMPLANT
DRSG TELFA 3X8 NADH (GAUZE/BANDAGES/DRESSINGS) ×2 IMPLANT
DURAPREP 6ML APPLICATOR 50/CS (WOUND CARE) ×2 IMPLANT
ELECT REM PT RETURN 9FT ADLT (ELECTROSURGICAL) ×2
ELECTRODE REM PT RTRN 9FT ADLT (ELECTROSURGICAL) ×1 IMPLANT
EVACUATOR SILICONE 100CC (DRAIN) IMPLANT
GAUZE 4X4 16PLY RFD (DISPOSABLE) IMPLANT
GAUZE SPONGE 4X4 12PLY STRL (GAUZE/BANDAGES/DRESSINGS) ×2 IMPLANT
GLOVE BIOGEL PI IND STRL 8.5 (GLOVE) ×1 IMPLANT
GLOVE BIOGEL PI INDICATOR 8.5 (GLOVE) ×1
GLOVE ECLIPSE 8.5 STRL (GLOVE) ×2 IMPLANT
GLOVE EXAM NITRILE XL STR (GLOVE) IMPLANT
GOWN STRL REUS W/ TWL LRG LVL3 (GOWN DISPOSABLE) IMPLANT
GOWN STRL REUS W/ TWL XL LVL3 (GOWN DISPOSABLE) ×1 IMPLANT
GOWN STRL REUS W/TWL 2XL LVL3 (GOWN DISPOSABLE) ×2 IMPLANT
GOWN STRL REUS W/TWL LRG LVL3 (GOWN DISPOSABLE)
GOWN STRL REUS W/TWL XL LVL3 (GOWN DISPOSABLE) ×2
HEMOSTAT SURGICEL 2X14 (HEMOSTASIS) IMPLANT
KIT BASIN OR (CUSTOM PROCEDURE TRAY) ×2 IMPLANT
KIT DRAIN CSF ACCUDRAIN (MISCELLANEOUS) ×2 IMPLANT
KIT TURNOVER KIT B (KITS) ×2 IMPLANT
NEEDLE HYPO 22GX1.5 SAFETY (NEEDLE) ×2 IMPLANT
NS IRRIG 1000ML POUR BTL (IV SOLUTION) ×2 IMPLANT
PACK BATTERY CMF DISP FOR DVR (ORTHOPEDIC DISPOSABLE SUPPLIES) ×2 IMPLANT
PACK CRANIOTOMY CUSTOM (CUSTOM PROCEDURE TRAY) ×2 IMPLANT
PATTIES SURGICAL .5 X.5 (GAUZE/BANDAGES/DRESSINGS) ×2 IMPLANT
PATTIES SURGICAL .5 X1 (DISPOSABLE) ×2 IMPLANT
PATTIES SURGICAL .5 X3 (DISPOSABLE) ×2 IMPLANT
PATTIES SURGICAL 1X1 (DISPOSABLE) ×4 IMPLANT
PIN MAYFIELD SKULL DISP (PIN) IMPLANT
PLATE BONE 12 2H TARGET XL (Plate) ×4 IMPLANT
SCREW UNIII AXS SD 1.5X4 (Screw) ×16 IMPLANT
SPECIMEN JAR SMALL (MISCELLANEOUS) IMPLANT
SPONGE NEURO XRAY DETECT 1X3 (DISPOSABLE) ×2 IMPLANT
SPONGE SURGIFOAM ABS GEL 100 (HEMOSTASIS) IMPLANT
STAPLER SKIN PROX WIDE 3.9 (STAPLE) ×2 IMPLANT
SUT ETHILON 3 0 FSL (SUTURE) IMPLANT
SUT NURALON 4 0 TR CR/8 (SUTURE) ×4 IMPLANT
SUT VIC AB 2-0 CP2 18 (SUTURE) ×4 IMPLANT
SUT VIC AB 4-0 RB1 18 (SUTURE) ×2 IMPLANT
TAPE SURG TRANSPORE 1 IN (GAUZE/BANDAGES/DRESSINGS) ×1 IMPLANT
TAPE SURGICAL TRANSPORE 1 IN (GAUZE/BANDAGES/DRESSINGS) ×2
TOWEL GREEN STERILE (TOWEL DISPOSABLE) ×2 IMPLANT
TOWEL GREEN STERILE FF (TOWEL DISPOSABLE) ×2 IMPLANT
TRAY FOLEY MTR SLVR 16FR STAT (SET/KITS/TRAYS/PACK) IMPLANT
UNDERPAD 30X36 HEAVY ABSORB (UNDERPADS AND DIAPERS) IMPLANT
WATER STERILE IRR 1000ML POUR (IV SOLUTION) ×2 IMPLANT

## 2020-10-17 NOTE — Progress Notes (Signed)
Just pull abg from artline: results are: 7.52, 30.6, 479, 25.2, 100%.

## 2020-10-17 NOTE — H&P (Signed)
Neurology H&P  CC: brain bleed, code stroke  History is obtained from: RN  HPI: Jane Martin is a 25 y.o. female with no significant PMHx who arrived by EMS today after collapsing at a gas station. It was told that she yelled out for help and for someone to call 911, then collapsed to the ground. When EMS arrived, she was unresponsive which worsened to comatose. Intubated in ED after administration of Etomidate 20mg  and Succinylcholine 100mg  at 1742.    Taken emergently to CT which showed a bleed.   Per mother, patient does not take any meds or have any medical conditions that mother is aware. Mother states that she herself had a "bleed" in her brain from high BP. No FMHx of stroke.   Last 2 UC visits on chart for vaginal discharge/itching in 3/22 and 10/21. Going back 5 years in [place], she has not been seen for anything serious.   No medications listed except for allergic rhinitis.    LKW: unknown tpa given?: No, ICH IR Thrombectomy? No, ICH MRS 0.  NIHSS: comatose and intubated.   ICH score 3   ROS: Unable to obtain due to altered mental status.   PMHx: Allergic rhinitis   Family History  Problem Relation Age of Onset  . Hypertension Mother   . Healthy Father     Social History:  reports that she has never smoked. She has never used smokeless tobacco. She reports current alcohol use. She reports current drug use. Drug: Marijuana.   Prior to Admission medications   Medication Sig Start Date End Date Taking? Authorizing Provider  Cetirizine HCl 10 MG CAPS Take 1 capsule (10 mg total) by mouth daily. 10/15/19   Wieters, Hallie C, PA-C  clindamycin (CLEOCIN) 2 % vaginal cream Place 1 Applicatorful vaginally at bedtime. 09/15/20   Lamptey, 12/15/19, MD  clotrimazole (LOTRIMIN) 1 % cream Apply to affected area 2 times daily for up to one week. 02/17/19   Britta Mccreedy, MD  fexofenadine-pseudoephedrine (ALLEGRA-D) 60-120 MG 12 hr tablet Take 1 tablet by mouth every 12 (twelve)  hours. 05/29/19   Mardella Layman, FNP  fluconazole (DIFLUCAN) 150 MG tablet Take 1 tablet (150 mg total) by mouth daily. Take one tablet today.  May repeat in 3 days. 09/13/20   Lurline Idol, NP  fluticasone (FLONASE) 50 MCG/ACT nasal spray Place 2 sprays into both nostrils daily. 05/29/19   Mickie Bail, FNP    Physical Exam  Constitutional: acutely ill appearing female intubated lying on stretcher. Psych: comatose.  Eyes: No scleral injection HENT: intubated.  Head: Normocephalic.  Cardiovascular: Sinus brady in 50s.  Respiratory: intubated on ventilator.  Skin: WDI  GCS:  Best eye  1         Best verbal  1    Best motor  3   Total 5  Neuro: Mental Status: Comatose.  Speech: mute intubated.  Cranial Nerves: II: Right pupil is 3 mm and reactive, left pupil is 6 mm and sluggish III,IV, VI: eyes midline. No nystagmus.  V, VII: No corneal reflex OU. No reaction to brow pressure  VIII: no response to name calling or loud clap.  IX, X: + cough and gag reflex with suctioning.  XI: head is midline.  XII: intubated.  Motor/sensory: No response to chest rub. Flexes to noxious stimuli in UEs. Withdraws slightly to pain in LEs.  Tone is normal. Bulk is normal.  Plantars: Toes are downgoing bilaterally.  Cerebellum: no tremors noted.  I have reviewed labs in epic and the pertinent results are: K 3.0   Glucose 156   LA 5.3   Hgb 14.3  PT/ INR 16/1.3      MD reviewed.  Imaging:  CTH1. 5.6 cm in long axis acute left frontoparietal intraparenchymal hematoma, decompressing into the left lateral ventricle which contains a substantial amount of acute blood products which extend into the third ventricle. 1.0 cm of left-to-right midline shift with effacement of the basilar cisterns. 2. No acute cervical spine findings. 3. Chronic right maxillary sinusitis.  CTA head 1. No intracranial aneurysm or AVM is identified. However, a contrast-enhanced brain MRI and MR angiography  should be considered once the hematoma involute to further evaluate for any underlying lesion. 2. No contrast blush in the region of the left cerebral hemisphere acute parenchymal hematoma. 3. No intracranial large vessel occlusion or proximal high-grade arterial stenosis. CTA neck:  1. The common carotid, internal carotid and vertebral arteries are patent within the neck without stenosis. No evidence of dissection or aneurysm. 2. Right mainstem intubation.  Exam: Current vital signs: BP 120/76   Temp (!) 95 F (35 C) (Temporal)   Resp (!) 23   Ht 5\' 8"  (1.727 m)   Wt 72.6 kg   SpO2 100%   BMI 24.33 kg/m   Assessment: 25 yo female who was at a gas station earlier today when she yelled for help, then collapsed, and became unresponsive. Her mental status continued to decline en route and upon arrival to ED was intubated. CTH showed 5.6 cm left frontoparietal intraparenchymal hematoma decompressing into the left lateral ventricle with blood products which extend into the 3rd ventricle. 1 cm midline shift noted. CTA head and neck showed no aneurysm, LVO, or AVM. Mannitol was started urgently in ED and Neurosurgery consulted due to bleed and plan urgent OR procedure.   Discussion with mother in Consult room explaining findings and treatment plan.     Impression: Intraparenchymal hematoma which is currently of unclear etiology.  There is no evidence of aneurysm or AVM on CT angiography, however an underlying lesion could be obscured by the hematoma itself.   Plan: 1. Neurology admit to ICU.  2. Neurosurgery to take patient urgently to OR. Mannitol x 1 completed.  3. Maintain BP less than 140.  4. No anti platelets or chemical DVT prophylaxis or AC.  5. SCDs.  6. Post op imaging per neurosurgery.  7. NPO 8. Stroke risk factor modification.  9. UDS, HIV, UA.  10. OT/PT 11. Stroke team to follow.     I have seen the patient reviewed the above note.  I was present for the  entirety of the evaluation and management reflected in the above note.  She has an intraparenchymal hematoma of unclear etiology.  She is going to the OR for rapid decompression  This patient is critically ill and at significant risk of neurological worsening, death and care requires constant monitoring of vital signs, hemodynamics,respiratory and cardiac monitoring, neurological assessment, discussion with family, other specialists and medical decision making of high complexity. I spent 50 minutes of neurocritical care time  in the care of  this patient. This was time spent independent of any time provided by nurse practitioner or PA.   21, MD [facility] [phone]  If 7pm- 7am, please page neurology on call as listed in AMION.

## 2020-10-17 NOTE — Anesthesia Procedure Notes (Signed)
Arterial Line Insertion Start/End4/15/2022 7:19 PM, 10/17/2020 7:20 PM Performed by: Zollie Scale, CRNA, CRNA  Patient location: OR. Emergency situation Right, radial was placed Catheter size: 20 G Hand hygiene performed  and Seldinger technique used Allen's test indicative of satisfactory collateral circulation Attempts: 1 Procedure performed without using ultrasound guided technique. Following insertion, Biopatch and dressing applied. Post procedure assessment: normal  Patient tolerated the procedure well with no immediate complications.

## 2020-10-17 NOTE — Progress Notes (Signed)
eLink Physician-Brief Progress Note Patient Name: Jane Martin DOB: July 23, 1995 MRN: [id number]   Date of Service  10/17/2020  HPI/Events of Note  25 yo F who collapsed at a gas station and became unresponsive with focal neurological findings, CT brain showed large left fronto-parietal ICH with intra-ventricular extension and mass effect, she had a craniotomy to evacuate the hematoma earlier tonight, she has no readily apparent risk factors for ICH.  eICU Interventions  New Patient Evaluation completed. Will order urine drug screen.        Thomasene Lot [name] 10/17/2020, 11:24 PM

## 2020-10-17 NOTE — Op Note (Signed)
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 Postoperative diagnosis same. Procedure: Left parietal craniotomy with evacuation of intracerebral hemorrhage.  Placement of intraventricular catheter. Surgeon: Barnett Abu Anesthesia: General endotracheal Indications: Jane Martin is a 25 year old individual who apparently suddenly felt bad at a gas station today.  EMS was summoned and the patient was noted to have developed rapid onset of coma.  She was brought to [facility] and emergently intubated.  She is found to have a large intracerebral hemorrhage on the left side in the parietal region with a extension into the ventricle.  She was initially evaluated and found to have herniation syndrome with a dilated left pupil and flexor posturing on the left side and minimal movement on the right side.  I was summoned to the emergency room around 615.  At 625 I posted her's for surgical intervention and by 645 the patient was being brought up to the operating room.  Procedure: The patient was brought to the operating room already intubated.  She was placed on the table in supine position and her head was placed in the 3 pin head holder and turned to the right side.  The left parietal region was shaved.  The patient had braids of hair that were removed in the centers section of the parietal region.  The skin was prepped so that a vertical incision could be made from the anterior border of the ear all the way to the vertex of the scalp.  This area was prepped with DuraPrep and then draped in a sterile fashion.  Skin was infiltrated with lidocaine with epinephrine.  A vertical incision was created and carried down to the galea.  The subperiosteal was then opened on either side and the galea was spread the temporalis muscle and fascia was opened also.  It was scraped from the underlying periosteum.  Then a  singular bur hole was placed in the temporal fossa.  A large parietal bone flap was then raised from the single bur hole.  The bone was laid aside.  The underlying dura was noted to be slightly discolored with a bluish hue and was under a modest amount of tension.  The dura was opened in a cruciate fashion.  The brain was noted to be suffused with blood.  There was an area that pointed centrally in this area had some bleeding at the court tackle surface.  This was gently cauterized and the cavity was entered with a significant hematoma within it.  The hematoma was evacuated and this rapidly led to decompression of the the brain.  The surrounding brain though was diffusely hemorrhagic.  Controlling the hemorrhage required bipolar suction cautery while lining the cavity in a circumferential fashion.  The cavity was gradually enlarged.  During this time approximately blood loss was estimated at 4 to 500 cc.  The resection continued until a small cluster of tissue was found that was cauterized and removed in a singular piece.  After this it seemed that the hemorrhage subsided considerably.  Several biopsies of the surrounding brain were obtained.  The procedure continued until the hemorrhage had ceased completely.  The ventricle was noted to have been entered and it was irrigated copiously as possible to remove what ever blood products were within it.  Once that hemostasis was well-established in the cavity a ventricular catheter was left into the ventricle and brought out through a separate incision.  The dura was then loosely approximated  over this area with 4-0 Nurolon.  Tack up sutures were placed around the perimetry of the dura.  We then continued with the closure by replacing the bone flap with 2 dog bone plates and a bur hole cover.  A portion of the bone was resected superiorly to allow passage of the ventricular catheter.  The ventricular catheter was then brought out through a separate stab in the scalp.  The  galea was then closed with the temporalis fascia and using 2-0 Vicryl and surgical staples were used in the scalp blood loss for the entire procedure was estimated approximately 600 cc.  A dry sterile dressing was applied to the patient's head and the best fashion possible in the system was connected to a constant drain for the ventricular catheter.  Patient was then returned to the ICU in a stable condition.  During the course of the operation we were notified that the patient's Covid test was positive.

## 2020-10-17 NOTE — Progress Notes (Signed)
   10/17/20 1825  Clinical Encounter Type  Visited With Patient and family together  Visit Type Trauma  Referral From Nurse  Consult/Referral To Chaplain  Spiritual Encounters  Spiritual Needs Emotional;Prayer  Advance Directives (For Healthcare)  Does Patient Have a Medical Advance Directive? No  Would patient like information on creating a medical advance directive? No - Patient declined  Mental Health Advance Directives  Does Patient Have a Mental Health Advance Directive? No  Would patient like information on creating a mental health advance directive? No - Patient declined  The chaplain was paged for trauma. The chaplain spoke with the mother Wilnette Kales) of the patient. The chaplain is providing spiritual support to the mother. The chaplain prayed for the patient. The patient will be going to surgery. The chaplain is available to provide spiritual and emotional support to the patient's family. The chaplain will follow up as needed.

## 2020-10-17 NOTE — ED Triage Notes (Signed)
Pt BIB [facility] after pulling into a gas station, stated, "help me" fell and then became responsiveness gradually decreasing. Unresponsive upon arrival to [facility].  Per EMS VS 110/70 P- 40-100 [id number]. Spo2 unable to obtain, Resp 36.

## 2020-10-17 NOTE — Anesthesia Preprocedure Evaluation (Addendum)
Anesthesia Evaluation  Patient identified by MRN, date of birth, ID band Patient unresponsive  Preop documentation limited or incomplete due to emergent nature of procedure.  Airway Mallampati: Intubated       Dental   Pulmonary    breath sounds clear to auscultation   + intubated    Cardiovascular  Rhythm:Regular Rate:Normal     Neuro/Psych    GI/Hepatic   Endo/Other    Renal/GU      Musculoskeletal   Abdominal   Peds  Hematology   Anesthesia Other Findings   Reproductive/Obstetrics                            Anesthesia Physical Anesthesia Plan  ASA: III and emergent  Anesthesia Plan: General   Post-op Pain Management:    Induction: Intravenous  PONV Risk Score and Plan: 3 and Dexamethasone and Ondansetron  Airway Management Planned: Oral ETT  Additional Equipment:   Intra-op Plan:   Post-operative Plan: Post-operative intubation/ventilation  Informed Consent:   Plan Discussed with: CRNA and Anesthesiologist  Anesthesia Plan Comments:         Anesthesia Quick Evaluation

## 2020-10-17 NOTE — ED Provider Notes (Signed)
[facility] EMERGENCY DEPARTMENT Provider Note   CSN: [id number] Arrival date & time: 10/17/20  1734     History Chief Complaint  Patient presents with  . Fall    Patt Steinhardt Mastropietro is a 25 y.o. female.  HPI Patient is a 25 year old female with a minimal medical history presents for unresponsiveness.  Patient was at a gas station when she suddenly stopped and cried out asking for someone to call 911 and then collapsed.  When EMS arrived, she was unresponsive.  On arrival, patient with disconjugate gaze pupils 3 mm not reactive to light.  Patient has no known medical history in the chart.  Patient was not responding to any questions and only had minimal stimulus to sensation.  Patient unable to follow commands.  Agonal respirations.  No past medical history on file.  Patient Active Problem List   Diagnosis Date Noted  . ICH (intracerebral hemorrhage) ([place]) 10/17/2020    No past surgical history on file.   OB History   No obstetric history on file.     Family History  Problem Relation Age of Onset  . Hypertension Mother   . Healthy Father     Social History   Tobacco Use  . Smoking status: Never Smoker  . Smokeless tobacco: Never Used  Vaping Use  . Vaping Use: Never used  Substance Use Topics  . Alcohol use: Yes  . Drug use: Yes    Types: Marijuana    Home Medications Prior to Admission medications   Medication Sig Start Date End Date Taking? Authorizing Provider  Cetirizine HCl 10 MG CAPS Take 1 capsule (10 mg total) by mouth daily. 10/15/19   Wieters, Hallie C, PA-C  clindamycin (CLEOCIN) 2 % vaginal cream Place 1 Applicatorful vaginally at bedtime. 09/15/20   Lamptey, Myrene Galas, MD  clotrimazole (LOTRIMIN) 1 % cream Apply to affected area 2 times daily for up to one week. 02/17/19   Vanessa Kick, MD  fexofenadine-pseudoephedrine (ALLEGRA-D) 60-120 MG 12 hr tablet Take 1 tablet by mouth every 12 (twelve) hours. 05/29/19   Enrique Sack, FNP   fluconazole (DIFLUCAN) 150 MG tablet Take 1 tablet (150 mg total) by mouth daily. Take one tablet today.  May repeat in 3 days. 09/13/20   Sharion Balloon, NP  fluticasone (FLONASE) 50 MCG/ACT nasal spray Place 2 sprays into both nostrils daily. 05/29/19   Enrique Sack, FNP    Allergies    Flagyl [metronidazole]  Review of Systems   Review of Systems  Unable to perform ROS: Acuity of condition    Physical Exam Updated Vital Signs BP 105/64   Temp (!) 95 F (35 C) (Temporal)   Resp 16   Ht _0  (1.727 m)   Wt 72.6 kg   SpO2 100%   BMI 24.33 kg/m   Physical Exam Vitals and nursing note reviewed.  Constitutional:      General: She is in acute distress.     Appearance: She is well-developed. She is ill-appearing.  HENT:     Head: Normocephalic and atraumatic.     Nose: No congestion.     Mouth/Throat:     Pharynx: No oropharyngeal exudate or posterior oropharyngeal erythema.  Eyes:     Conjunctiva/sclera: Conjunctivae normal.  Cardiovascular:     Rate and Rhythm: Normal rate and regular rhythm.     Heart sounds: No murmur heard.   Pulmonary:     Effort: Respiratory distress present.     Breath sounds:  Normal breath sounds. No wheezing.  Abdominal:     General: There is no distension.     Palpations: Abdomen is soft.     Tenderness: There is no abdominal tenderness. There is no right CVA tenderness or left CVA tenderness.  Musculoskeletal:        General: No swelling or tenderness. Normal range of motion.     Cervical back: Neck supple.  Skin:    General: Skin is warm and dry.  Neurological:     Mental Status: She is alert.     Cranial Nerves: Cranial nerve deficit present.     Comments: Pupils 3 mm on left and 4 mm on right nonreactive to light.  Disconjugate gaze.  Patient unable to participate neurologic exam otherwise.  GCS approximately 7.     ED Results / Procedures / Treatments   Labs (all labs ordered are listed, but only abnormal results are  displayed) Labs Reviewed  COMPREHENSIVE METABOLIC PANEL - Abnormal; Notable for the following components:      Result Value   Potassium 3.0 (*)    CO2 19 (*)    Glucose, Bld 156 (*)    All other components within normal limits  LACTIC ACID, PLASMA - Abnormal; Notable for the following components:   Lactic Acid, Venous 5.3 (*)    All other components within normal limits  PROTIME-INR - Abnormal; Notable for the following components:   Prothrombin Time 16.0 (*)    INR 1.3 (*)    All other components within normal limits  CBG MONITORING, ED - Abnormal; Notable for the following components:   Glucose-Capillary 153 (*)    All other components within normal limits  I-STAT CHEM 8, ED - Abnormal; Notable for the following components:   Potassium 3.0 (*)    Glucose, Bld 156 (*)    TCO2 21 (*)    All other components within normal limits  RESP PANEL BY RT-PCR (FLU A&B, COVID) ARPGX2  ETHANOL  CBC WITH DIFFERENTIAL/PLATELET  CBC  URINALYSIS, ROUTINE W REFLEX MICROSCOPIC  HIV ANTIBODY (ROUTINE TESTING W REFLEX)  RAPID URINE DRUG SCREEN, HOSP PERFORMED  TYPE AND SCREEN  ABO/RH    EKG None  Radiology CT HEAD WO CONTRAST  Result Date: 10/17/2020 CLINICAL DATA:  Patient is unresponsive and with abnormal posture EXAM: CT HEAD WITHOUT CONTRAST CT CERVICAL SPINE WITHOUT CONTRAST TECHNIQUE: Multidetector CT imaging of the head and cervical spine was performed following the standard protocol without intravenous contrast. Multiplanar CT image reconstructions of the cervical spine were also generated. COMPARISON:  None. FINDINGS: CT HEAD FINDINGS Brain: 5.6 by 3.8 by 4.4 cm (volume = 49 cm^3) acute left frontoparietal intraparenchymal hematoma is noted. This is decompressed into the left lateral ventricle which likewise contains a substantial amount of acute blood products which also extend into the third ventricle. There is approximately 1.0 cm of left-to-right midline shift. Borderline accentuated  density along the tentorium and falx but overall I am skeptical that this represents subarachnoid hemorrhage. Effacement of the basilar cisterns including the suprasellar cistern and ambient cisterns. Vascular: Unremarkable Skull: Unremarkable Sinuses/Orbits: Mild chronic right maxillary sinusitis. CT CERVICAL SPINE FINDINGS Alignment: Reversal the normal cervical lordosis but no subluxation. Skull base and vertebrae: No fracture or acute bony findings. Soft tissues and spinal canal: Unremarkable Disc levels:  No bony impingement. Upper chest: Unremarkable Other: Endotracheal and nasogastric tubes noted. IMPRESSION: 1. 5.6 cm in long axis acute left frontoparietal intraparenchymal hematoma, decompressing into the left lateral ventricle which contains  a substantial amount of acute blood products which extend into the third ventricle. 1.0 cm of left-to-right midline shift with effacement of the basilar cisterns. 2. No acute cervical spine findings. 3. Chronic right maxillary sinusitis. Critical Value/emergent results were called by telephone at the time of interpretation on 10/17/2020 at 6:10 pm to provider Dr. Pattricia Boss , who verbally acknowledged these results. Electronically Signed   By: Van Clines M.D.   On: 10/17/2020 18:19   CT Cervical Spine Wo Contrast  Result Date: 10/17/2020 CLINICAL DATA:  Patient is unresponsive and with abnormal posture EXAM: CT HEAD WITHOUT CONTRAST CT CERVICAL SPINE WITHOUT CONTRAST TECHNIQUE: Multidetector CT imaging of the head and cervical spine was performed following the standard protocol without intravenous contrast. Multiplanar CT image reconstructions of the cervical spine were also generated. COMPARISON:  None. FINDINGS: CT HEAD FINDINGS Brain: 5.6 by 3.8 by 4.4 cm (volume = 49 cm^3) acute left frontoparietal intraparenchymal hematoma is noted. This is decompressed into the left lateral ventricle which likewise contains a substantial amount of acute blood products  which also extend into the third ventricle. There is approximately 1.0 cm of left-to-right midline shift. Borderline accentuated density along the tentorium and falx but overall I am skeptical that this represents subarachnoid hemorrhage. Effacement of the basilar cisterns including the suprasellar cistern and ambient cisterns. Vascular: Unremarkable Skull: Unremarkable Sinuses/Orbits: Mild chronic right maxillary sinusitis. CT CERVICAL SPINE FINDINGS Alignment: Reversal the normal cervical lordosis but no subluxation. Skull base and vertebrae: No fracture or acute bony findings. Soft tissues and spinal canal: Unremarkable Disc levels:  No bony impingement. Upper chest: Unremarkable Other: Endotracheal and nasogastric tubes noted. IMPRESSION: 1. 5.6 cm in long axis acute left frontoparietal intraparenchymal hematoma, decompressing into the left lateral ventricle which contains a substantial amount of acute blood products which extend into the third ventricle. 1.0 cm of left-to-right midline shift with effacement of the basilar cisterns. 2. No acute cervical spine findings. 3. Chronic right maxillary sinusitis. Critical Value/emergent results were called by telephone at the time of interpretation on 10/17/2020 at 6:10 pm to provider Dr. Pattricia Boss , who verbally acknowledged these results. Electronically Signed   By: Van Clines M.D.   On: 10/17/2020 18:19   DG Chest Portable 1 View  Result Date: 10/17/2020 CLINICAL DATA:  ET tube check EXAM: PORTABLE CHEST 1 VIEW COMPARISON:  Chest radiograph 10/18/2018 at 5:52 p.m. FINDINGS: Interval retraction of the endotracheal tube with tip approximately 1.5 cm above the carina. Enteric catheter in place. Stable cardiomediastinal contours. Low lung volumes but otherwise clear. No pneumothorax or pleural effusion. No acute finding in the visualized skeleton. IMPRESSION: Interval retraction of the endotracheal tube with tip approximately 1.5 cm above the carina.  Electronically Signed   By: Audie Pinto M.D.   On: 10/17/2020 19:06   DG Chest Port 1 View  Result Date: 10/17/2020 CLINICAL DATA:  Unresponsive. EXAM: PORTABLE CHEST 1 VIEW COMPARISON:  None. FINDINGS: Single frontal view of the chest demonstrates endotracheal tube overlying tracheal air column, tip overlying the right mainstem bronchus. Recommend retracting endotracheal tube approximately 4 cm to ensure adequate placement above the carina. Enteric catheter passes below diaphragm tip and side port projecting over gastric fundus. Cardiac silhouette is unremarkable. No airspace disease, effusion, or pneumothorax. There are no acute bony abnormalities. IMPRESSION: 1. Right mainstem intubation. Recommend retracting endotracheal tube approximately 4 cm to ensure adequate placement above carina. 2. No acute intrathoracic process. Critical Value/emergent results were called by telephone at the time  of interpretation on 10/17/2020 at 6:09 pm to provider [facility] RAY , who verbally acknowledged these results. Electronically Signed   By: Randa Ngo M.D.   On: 10/17/2020 18:09   CT ANGIO HEAD NECK W WO CM (CODE STROKE)  Result Date: 10/17/2020 CLINICAL DATA:  Patient unresponsive with abnormal posture. EXAM: CT HEAD WITHOUT CONTRAST CT ANGIOGRAPHY HEAD AND NECK TECHNIQUE: Multidetector CT imaging of the head and neck was performed using the standard protocol during bolus administration of intravenous contrast. Multiplanar CT image reconstructions and MIPs were obtained to evaluate the vascular anatomy. Carotid stenosis measurements (when applicable) are obtained utilizing NASCET criteria, using the distal internal carotid diameter as the denominator. CONTRAST:  64m OMNIPAQUE IOHEXOL 350 MG/ML SOLN COMPARISON:  CT head/cervical spine performed earlier today 10/17/2020. FINDINGS: CTA NECK FINDINGS Aortic arch: Standard aortic branching. The visualized aortic arch is normal in caliber. No hemodynamically  significant innominate or proximal subclavian artery stenosis. Right carotid system: CCA and ICA patent within the neck without stenosis. Left carotid system: CCA and ICA patent within the neck without stenosis. Vertebral arteries: The intracranial vertebral arteries are patent. Skeleton: No acute bony abnormality or aggressive osseous lesion. Other neck: No neck mass or cervical lymphadenopathy. Upper chest: No consolidation within the imaged lung apices. Partially imaged ET tube extending into the right mainstem bronchus. Partially imaged enteric tube. Review of the MIP images confirms the above findings CTA HEAD FINDINGS Anterior circulation: The intracranial internal carotid arteries are patent. The M1 middle cerebral arteries are patent. No M2 proximal branch occlusion or high-grade proximal stenosis is identified. The anterior cerebral arteries are patent. No intracranial aneurysm is identified. No evidence of AVM. No contrast blush at the site of the patient's left cerebral hemisphere acute parenchymal hematoma. Posterior circulation: The intracranial vertebral arteries are patent. The basilar artery is patent. The posterior cerebral arteries are patent. Venous sinuses: The dural venous sinuses are poorly assessed due to contrast bolus timing. Anatomic variants: None significant Review of the MIP images confirms the above findings Findings of right mainstem bronchus intubation called by telephone at the time of interpretation on 10/17/2020 at 6:31 pm [facility] , who verbally acknowledged these results. IMPRESSION: CTA neck: 1. The common carotid, internal carotid and vertebral arteries are patent within the neck without stenosis. No evidence of dissection or aneurysm. 2. Right mainstem intubation. CTA head: 1. No intracranial aneurysm or AVM is identified. However, a contrast-enhanced brain MRI and MR angiography should be considered once the hematoma involute to further evaluate for any  underlying lesion. 2. No contrast blush in the region of the left cerebral hemisphere acute parenchymal hematoma. 3. No intracranial large vessel occlusion or proximal high-grade arterial stenosis. Electronically Signed   By: KKellie SimmeringDO   On: 10/17/2020 18:39    Procedures Procedure Name: Intubation Date/Time: 10/17/2020 7:03 PM Performed by: CTretha Sciara MD Pre-anesthesia Checklist: Patient identified, Emergency Drugs available, Suction available, Patient being monitored and Timeout performed Oxygen Delivery Method: Non-rebreather mask Preoxygenation: Pre-oxygenation with 100% oxygen Induction Type: Rapid sequence Laryngoscope Size: Mac and 4 Grade View: Grade I Tube size: 8.0 mm Number of attempts: 1 Airway Equipment and Method: Video-laryngoscopy Placement Confirmation: ETT inserted through vocal cords under direct vision,  Positive ETCO2,  CO2 detector and Breath sounds checked- equal and bilateral Secured at: 25 cm Tube secured with: ETT holder Dental Injury: Teeth and Oropharynx as per pre-operative assessment     .Critical Care Performed by: CTretha Sciara MD Authorized by:  Pattricia Boss, MD   Critical care provider statement:    Critical care time (minutes):  30   Critical care time was exclusive of:  Separately billable procedures and treating other patients   Critical care was necessary to treat or prevent imminent or life-threatening deterioration of the following conditions:  CNS failure or compromise and respiratory failure   Critical care was time spent personally by me on the following activities:  Development of treatment plan with patient or surrogate, discussions with consultants, evaluation of patient's response to treatment, examination of patient, re-evaluation of patient's condition, ordering and review of radiographic studies and ordering and review of laboratory studies   Care discussed with: admitting provider       Medications Ordered in  ED Medications  atropine 1 MG/10ML injection (has no administration in time range)  EPINEPHrine (ADRENALIN) 1 MG/10ML injection (has no administration in time range)  fentaNYL (SUBLIMAZE) 100 MCG/2ML injection (has no administration in time range)   stroke: mapping our early stages of recovery book (has no administration in time range)  acetaminophen (TYLENOL) tablet 650 mg (has no administration in time range)    Or  acetaminophen (TYLENOL) 160 MG/5ML solution 650 mg (has no administration in time range)    Or  acetaminophen (TYLENOL) suppository 650 mg (has no administration in time range)  senna-docusate (Senokot-S) tablet 1 tablet (has no administration in time range)  pantoprazole (PROTONIX) injection 40 mg (has no administration in time range)  etomidate (AMIDATE) injection (20 mg Intravenous Given 10/17/20 1742)  succinylcholine (ANECTINE) injection (100 mg Intravenous Given 10/17/20 1742)  propofol (DIPRIVAN) 1000 MG/100ML infusion (60 mcg/kg/min  New Bag/Given 10/17/20 1829)  iohexol (OMNIPAQUE) 350 MG/ML injection 75 mL (75 mLs Intravenous Contrast Given 10/17/20 1822)  mannitol 20 % IVPB 70 g (70 g Intravenous New Bag/Given 10/17/20 1829)  fentaNYL (SUBLIMAZE) injection 50 mcg (50 mcg Intravenous Given 10/17/20 1849)    ED Course  I have reviewed the triage vital signs and the nursing notes.  Pertinent labs & imaging results that were available during my care of the patient were reviewed by me and considered in my medical decision making (see chart for details).    MDM Rules/Calculators/A&P                          Patient is a 25 year old female with unknown medical history presents for altered mental status after syncope and collapse today recitation.  On arrival, patient's GCS is 7 and patient unresponsive.  Pupils 3 to 4 mm.  Patient not following any commands.  Proceeded following emergency medical evaluation.  Airway: Patient's airway not intact based on GCS and inability to  protect airway.  Proceeded with intubation was completed without difficulty as above under video laryngoscopy.  ETT secured at 25 cm and chest x-ray demonstrated deep placement, and was removed 2 cm.  Bilateral breath sounds were appreciated.  Vital signs otherwise reassuring with appropriate heart rate and blood pressure.  Appropriate IV access was obtained.  Patient was deemed stable for transfer to CT scanner under direct supervision of physician team.  CT head and CT C-spine were initiated with obvious intraparenchymal hemorrhage.  Patient upgraded to level 1 code stroke and neurology and neurosurgery were emergently consulted.  Joint discussion occurred in the emergency department for next steps and patient ultimately admitted to ICU level care with operative intervention emergently planned.  See above documentation for intubation note and critical care information.  Final  Clinical Impression(s) / ED Diagnoses Final diagnoses:  Fall    Rx / DC Orders ED Discharge Orders    None       Tretha Sciara, MD 10/17/20 1910    Pattricia Boss, MD 10/19/20 1455

## 2020-10-17 NOTE — Transfer of Care (Signed)
Immediate Anesthesia Transfer of Care Note  Patient: Jane Martin  Procedure(s) Performed: CRANIOTOMY HEMATOMA Evacuation (Left Head)  Patient Location: ICU  Anesthesia Type:General  Level of Consciousness: Patient remains intubated per anesthesia plan  Airway & Oxygen Therapy: Patient remains intubated per anesthesia plan and Patient placed on Ventilator (see vital sign flow sheet for setting)  Post-op Assessment: Report given to RN and Post -op Vital signs reviewed and stable  Post vital signs: Reviewed and stable  Last Vitals:  Vitals Value Taken Time  BP 132/83 (144/57 ABP) 10/17/20 2230  Temp    Pulse 51 10/17/20 2235  Resp 22 10/17/20 2235  SpO2 100 % 10/17/20 2235  Vitals shown include unvalidated device data.  Last Pain:  Vitals:   10/17/20 1745  TempSrc: Temporal     Report to RN in ICU, 2 units FFP given during procedure, patient remains intubated per anesthesia plan, propofol gtt remains infusing, full report given, RT applied to ventilator, SBP goals under 150 mmHg met per Elsner MD orders, reported ventricular drain orders per Elsner MD. VSS. Transition of patient care in safe and stable condition.    Complications: No complications documented.

## 2020-10-17 NOTE — Consult Note (Addendum)
NAME:  Jane Martin, MRN:  [id number], DOB:  Nov 14, 1995, LOS: 0 ADMISSION DATE:  10/17/2020, CONSULTATION DATE:  10/17/20 REFERRING MD:  Amada Jupiter - neuro , CHIEF COMPLAINT:    History of Present Illness:  25 yo F  medical history of sexually transmitted infections who was brought to ED 10/17/20 after saying "help me" and collapsing at a gas station. She was unresponsive by time of EMS arrival and per chart review at time of EMS pt had 6mm fixed pupils, disconjugate gaze, minimal response to pain and agonal respiraitons. In ED, patient intubated for airway protection given low GCS. Code stroke initiated and pt found to have a large acute L frontoparietal intraparenchymal hematoma, decompressed into L lateral ventricle and extends into third ventricle, with 1cm L to R midline shift. NSGY and Neuro consulted. The patient is being taken emergently to the OR and will return to the ICU intubated following the case.   Labs in ED significant for K 3, CO2 19, Glu 156, LA 5.3, INR 1.3  PCCM has been consulted in this setting.   Pertinent  Medical History  Chlamydia Gonhorrhea Candida vaginitis BV  Significant Hospital Events: Including procedures, antibiotic start and stop dates in addition to other pertinent events   . 4/15 BIB EMS after collapsing at gas station and losing consciousness. Large intraparenchymal hematoma.  Going to OR emergently. CCM consulted for expectant vent management   Interim History / Subjective:  ICU after emergent OR Chemically paralyzed after case prior to transport   Objective   Blood pressure 105/64, temperature (!) 95 F (35 C), temperature source Temporal, resp. rate 16, height 5\' 8"  (1.727 m), weight 72.6 kg, SpO2 100 %.    Vent Mode: PRVC FiO2 (%):  [100 %] 100 % Set Rate:  [18 bmp-26 bmp] 26 bmp Vt Set:  [500 mL-510 mL] 510 mL PEEP:  [5 cmH20] 5 cmH20 Plateau Pressure:  [16 cmH20] 16 cmH20  No intake or output data in the 24 hours ending 10/17/20  1903 Filed Weights   10/17/20 1800 10/17/20 1825  Weight: 72.6 kg 72.6 kg    Examination: General: Critically ill appearing WDWN young adult F intubated, chemically paralyzed sedated  HENT: ETT secure anicteric sclera. Post op cranial dressing intact  Lungs: CTAb symmetrical chest expansion, even and unlabored  Cardiovascular: bradycardic s1s2 no rgm  Abdomen: soft round + bowel sounds  Extremities: No acute joint deformity no cyanosis or clubbing  Neuro: Chemically paralyzed. Pinpoint pupuls GU: Foley with clear urine Skin: c/d/w no rash   Labs/imaging that I havepersonally reviewed  (right click and "Reselect all SmartList Selections" daily)  4/15 CT H> 5.6x4.4cm acute L frontoparietal intraparenchymal hematoma, decompressed into L ventricle which contains significant amount of blood, which extends into third ventricle as well. 1cm L to R midline shift. Effacement of basilar cisterns including suprasellar and ambient cisterns   Labs in ED significant for K 3, CO2 19, Glu 156, LA 5.3, INR 1.3   Resolved Hospital Problem list     Assessment & Plan:   IPH, s/p emergent crani -Large acute L frontoparietal intraparenchymal hematoma, decompressed into L ventricle, extending into third ventricle with associated L to R midline shift, with effacement of basilar cisterns  P -post op per NSGY  -imaging per NSGY / neuro  -mannitol x1 completed  -SBP <140  -weaned off pressors and SBP has stayed >160. Will order a cardene gtt  -hold chemical vte ppx  -post op CBC   Acute  respiratory failure requiring intubation COVID-19 positive -- no known sx of this  P -airborne/contact  -CXR and ABG in ICU -Full MV support -wean FiO2 PEEP as able -Prop, Fent  -will defer covid related therapies as no hx of pt having symptoms, and cxr without evidence of active asd   Hypokalemia P  -replace -AM BMP   Coagulopathy, very mild -will check AM coags -add on LFTs   Best practice (right  click and "Reselect all SmartList Selections" daily)  Diet:  NPO Pain/Anxiety/Delirium protocol (if indicated): Yes (RASS goal -1) VAP protocol (if indicated): Yes DVT prophylaxis: SCD GI prophylaxis: PPI Glucose control:  SSI No Central venous access:  N/A Arterial line:  Yes, and it is still needed Foley:  Yes, and it is still needed Mobility:  bed rest  PT consulted: N/A Last date of multidisciplinary goals of care discussion [per primary] Code Status:  full code Disposition: ICU   Labs   CBC: Recent Labs  Lab 10/17/20 1740 10/17/20 1749  WBC 6.3  --   NEUTROABS 1.8  --   HGB 13.8 14.3  HCT 41.7 42.0  MCV 93.3  --   PLT 250  --     Basic Metabolic Panel: Recent Labs  Lab 10/17/20 1740 10/17/20 1749  NA 136 140  K 3.0* 3.0*  CL 107 104  CO2 19*  --   GLUCOSE 156* 156*  BUN 7 7  CREATININE 0.81 0.70  CALCIUM 9.3  --    GFR: Estimated Creatinine Clearance: 111.3 mL/min (by C-G formula based on SCr of 0.7 mg/dL). Recent Labs  Lab 10/17/20 1740  WBC 6.3  LATICACIDVEN 5.3*    Liver Function Tests: Recent Labs  Lab 10/17/20 1740  AST 20  ALT 12  ALKPHOS 56  BILITOT 0.8  PROT 7.6  ALBUMIN 4.5   No results for input(s): LIPASE, AMYLASE in the last 168 hours. No results for input(s): AMMONIA in the last 168 hours.  ABG    Component Value Date/Time   TCO2 21 (L) 10/17/2020 1749     Coagulation Profile: Recent Labs  Lab 10/17/20 1740  INR 1.3*    Cardiac Enzymes: No results for input(s): CKTOTAL, CKMB, CKMBINDEX, TROPONINI in the last 168 hours.  HbA1C: No results found for: HGBA1C  CBG: Recent Labs  Lab 10/17/20 1739  GLUCAP 153*    Review of Systems:   Unable to obtain, intubated and chemically paralyzed   Past Medical History:  She,  has no past medical history on file.   Surgical History:  No past surgical history on file.   Social History:   reports that she has never smoked. She has never used smokeless tobacco. She  reports current alcohol use. She reports current drug use. Drug: Marijuana.   Family History:  Her family history includes Healthy in her father; Hypertension in her mother.   Allergies Allergies  Allergen Reactions  . Flagyl [Metronidazole]      Home Medications  Prior to Admission medications   Medication Sig Start Date End Date Taking? Authorizing Provider  Cetirizine HCl 10 MG CAPS Take 1 capsule (10 mg total) by mouth daily. 10/15/19   Wieters, Hallie C, PA-C  clindamycin (CLEOCIN) 2 % vaginal cream Place 1 Applicatorful vaginally at bedtime. 09/15/20   Lamptey, Britta Mccreedy, MD  clotrimazole (LOTRIMIN) 1 % cream Apply to affected area 2 times daily for up to one week. 02/17/19   Mardella Layman, MD  fexofenadine-pseudoephedrine (ALLEGRA-D) 60-120 MG 12 hr tablet  Take 1 tablet by mouth every 12 (twelve) hours. 05/29/19   Lurline Idol, FNP  fluconazole (DIFLUCAN) 150 MG tablet Take 1 tablet (150 mg total) by mouth daily. Take one tablet today.  May repeat in 3 days. 09/13/20   Mickie Bail, NP  fluticasone (FLONASE) 50 MCG/ACT nasal spray Place 2 sprays into both nostrils daily. 05/29/19   Lurline Idol, FNP     Critical care time:62 min     CRITICAL CARE Performed by: Lanier Clam   Total critical care time: 62 minutes  Critical care time was exclusive of separately billable procedures and treating other patients. Critical care was necessary to treat or prevent imminent or life-threatening deterioration.  Critical care was time spent personally by me on the following activities: development of treatment plan with patient and/or surrogate as well as nursing, discussions with consultants, evaluation of patient's response to treatment, examination of patient, obtaining history from patient or surrogate, ordering and performing treatments and interventions, ordering and review of laboratory studies, ordering and review of radiographic studies, pulse oximetry and re-evaluation of  patient's condition.  Tessie Fass MSN, AGACNP-BC [facility] [phone] If no answer, [phone] 10/17/2020, 7:59 PM

## 2020-10-17 NOTE — ED Notes (Signed)
Paged Neurosurgery and Activated Code Stroke

## 2020-10-17 NOTE — Anesthesia Postprocedure Evaluation (Signed)
Anesthesia Post Note  Patient: Jane Martin  Procedure(s) Performed: CRANIOTOMY HEMATOMA Evacuation (Left Head)     Patient location during evaluation: ICU Anesthesia Type: General Level of consciousness: patient remains intubated per anesthesia plan Pain management: pain level controlled Vital Signs Assessment: post-procedure vital signs reviewed and stable Respiratory status: patient remains intubated per anesthesia plan Cardiovascular status: stable Postop Assessment: no apparent nausea or vomiting Anesthetic complications: no   No complications documented.  Last Vitals:  Vitals:   10/17/20 1845 10/17/20 2227  BP:    Resp: 16   Temp:    SpO2: 100% 100%    Last Pain:  Vitals:   10/17/20 1745  TempSrc: Temporal                 Gopal Malter

## 2020-10-17 NOTE — ED Notes (Signed)
ETT 22 at the lip

## 2020-10-18 ENCOUNTER — Inpatient Hospital Stay (HOSPITAL_COMMUNITY): Payer: 59

## 2020-10-18 DIAGNOSIS — I615 Nontraumatic intracerebral hemorrhage, intraventricular: Secondary | ICD-10-CM | POA: Diagnosis not present

## 2020-10-18 DIAGNOSIS — U071 COVID-19: Secondary | ICD-10-CM | POA: Diagnosis not present

## 2020-10-18 DIAGNOSIS — J9601 Acute respiratory failure with hypoxia: Secondary | ICD-10-CM

## 2020-10-18 DIAGNOSIS — Z9889 Other specified postprocedural states: Secondary | ICD-10-CM | POA: Diagnosis not present

## 2020-10-18 DIAGNOSIS — I611 Nontraumatic intracerebral hemorrhage in hemisphere, cortical: Secondary | ICD-10-CM | POA: Diagnosis not present

## 2020-10-18 DIAGNOSIS — G936 Cerebral edema: Secondary | ICD-10-CM

## 2020-10-18 LAB — POCT I-STAT 7, (LYTES, BLD GAS, ICA,H+H)
Acid-Base Excess: 2 mmol/L (ref 0.0–2.0)
Acid-base deficit: 3 mmol/L — ABNORMAL HIGH (ref 0.0–2.0)
Acid-base deficit: 3 mmol/L — ABNORMAL HIGH (ref 0.0–2.0)
Acid-base deficit: 5 mmol/L — ABNORMAL HIGH (ref 0.0–2.0)
Acid-base deficit: 4 mmol/L — ABNORMAL HIGH (ref 0.0–2.0)
Acid-base deficit: 1 mmol/L (ref 0.0–2.0)
Bicarbonate: 22.3 mmol/L (ref 20.0–28.0)
Bicarbonate: 18.2 mmol/L — ABNORMAL LOW (ref 20.0–28.0)
Bicarbonate: 20.6 mmol/L (ref 20.0–28.0)
Bicarbonate: 19.8 mmol/L — ABNORMAL LOW (ref 20.0–28.0)
Bicarbonate: 25.2 mmol/L (ref 20.0–28.0)
Bicarbonate: 23.2 mmol/L (ref 20.0–28.0)
Calcium, Ion: 1.16 mmol/L (ref 1.15–1.40)
Calcium, Ion: 1.04 mmol/L — ABNORMAL LOW (ref 1.15–1.40)
Calcium, Ion: 1.12 mmol/L — ABNORMAL LOW (ref 1.15–1.40)
Calcium, Ion: 1.12 mmol/L — ABNORMAL LOW (ref 1.15–1.40)
Calcium, Ion: 1.07 mmol/L — ABNORMAL LOW (ref 1.15–1.40)
Calcium, Ion: 1.22 mmol/L (ref 1.15–1.40)
HCT: 36 % (ref 36.0–46.0)
HCT: 31 % — ABNORMAL LOW (ref 36.0–46.0)
HCT: 31 % — ABNORMAL LOW (ref 36.0–46.0)
HCT: 30 % — ABNORMAL LOW (ref 36.0–46.0)
HCT: 24 % — ABNORMAL LOW (ref 36.0–46.0)
HCT: 26 % — ABNORMAL LOW (ref 36.0–46.0)
Hemoglobin: 12.2 g/dL (ref 12.0–15.0)
Hemoglobin: 10.5 g/dL — ABNORMAL LOW (ref 12.0–15.0)
Hemoglobin: 10.5 g/dL — ABNORMAL LOW (ref 12.0–15.0)
Hemoglobin: 10.2 g/dL — ABNORMAL LOW (ref 12.0–15.0)
Hemoglobin: 8.2 g/dL — ABNORMAL LOW (ref 12.0–15.0)
Hemoglobin: 8.8 g/dL — ABNORMAL LOW (ref 12.0–15.0)
O2 Saturation: 100 %
O2 Saturation: 100 %
O2 Saturation: 100 %
O2 Saturation: 100 %
O2 Saturation: 100 %
O2 Saturation: 99 %
Patient temperature: 34.7
Patient temperature: 33.3
Patient temperature: 33.6
Patient temperature: 33.1
Patient temperature: 98.6
Patient temperature: 36.8
Potassium: 4.4 mmol/L (ref 3.5–5.1)
Potassium: 3.9 mmol/L (ref 3.5–5.1)
Potassium: 4.3 mmol/L (ref 3.5–5.1)
Potassium: 4.1 mmol/L (ref 3.5–5.1)
Potassium: 3.4 mmol/L — ABNORMAL LOW (ref 3.5–5.1)
Potassium: 3.2 mmol/L — ABNORMAL LOW (ref 3.5–5.1)
Sodium: 132 mmol/L — ABNORMAL LOW (ref 135–145)
Sodium: 137 mmol/L (ref 135–145)
Sodium: 138 mmol/L (ref 135–145)
Sodium: 140 mmol/L (ref 135–145)
Sodium: 144 mmol/L (ref 135–145)
Sodium: 143 mmol/L (ref 135–145)
TCO2: 24 mmol/L (ref 22–32)
TCO2: 19 mmol/L — ABNORMAL LOW (ref 22–32)
TCO2: 22 mmol/L (ref 22–32)
TCO2: 21 mmol/L — ABNORMAL LOW (ref 22–32)
TCO2: 26 mmol/L (ref 22–32)
TCO2: 24 mmol/L (ref 22–32)
pCO2 arterial: 36.7 mmHg (ref 32.0–48.0)
pCO2 arterial: 24.2 mmHg — ABNORMAL LOW (ref 32.0–48.0)
pCO2 arterial: 26.7 mmHg — ABNORMAL LOW (ref 32.0–48.0)
pCO2 arterial: 26.3 mmHg — ABNORMAL LOW (ref 32.0–48.0)
pCO2 arterial: 30.6 mmHg — ABNORMAL LOW (ref 32.0–48.0)
pCO2 arterial: 36 mmHg (ref 32.0–48.0)
pH, Arterial: 7.382 (ref 7.350–7.450)
pH, Arterial: 7.47 — ABNORMAL HIGH (ref 7.350–7.450)
pH, Arterial: 7.479 — ABNORMAL HIGH (ref 7.350–7.450)
pH, Arterial: 7.469 — ABNORMAL HIGH (ref 7.350–7.450)
pH, Arterial: 7.524 — ABNORMAL HIGH (ref 7.350–7.450)
pH, Arterial: 7.418 (ref 7.350–7.450)
pO2, Arterial: 516 mmHg — ABNORMAL HIGH (ref 83.0–108.0)
pO2, Arterial: 279 mmHg — ABNORMAL HIGH (ref 83.0–108.0)
pO2, Arterial: 365 mmHg — ABNORMAL HIGH (ref 83.0–108.0)
pO2, Arterial: 269 mmHg — ABNORMAL HIGH (ref 83.0–108.0)
pO2, Arterial: 479 mmHg — ABNORMAL HIGH (ref 83.0–108.0)
pO2, Arterial: 147 mmHg — ABNORMAL HIGH (ref 83.0–108.0)

## 2020-10-18 LAB — SODIUM
Sodium: 140 mmol/L (ref 135–145)
Sodium: 143 mmol/L (ref 135–145)
Sodium: 144 mmol/L (ref 135–145)

## 2020-10-18 LAB — CBC
HCT: 28.1 % — ABNORMAL LOW (ref 36.0–46.0)
HCT: 25.4 % — ABNORMAL LOW (ref 36.0–46.0)
Hemoglobin: 9.6 g/dL — ABNORMAL LOW (ref 12.0–15.0)
Hemoglobin: 8.9 g/dL — ABNORMAL LOW (ref 12.0–15.0)
MCH: 30.9 pg (ref 26.0–34.0)
MCH: 31.7 pg (ref 26.0–34.0)
MCHC: 34.2 g/dL (ref 30.0–36.0)
MCHC: 35 g/dL (ref 30.0–36.0)
MCV: 90.4 fL (ref 80.0–100.0)
MCV: 90.4 fL (ref 80.0–100.0)
Platelets: 156 10*3/uL (ref 150–400)
Platelets: 157 10*3/uL (ref 150–400)
RBC: 3.11 MIL/uL — ABNORMAL LOW (ref 3.87–5.11)
RBC: 2.81 MIL/uL — ABNORMAL LOW (ref 3.87–5.11)
RDW: 12.1 % (ref 11.5–15.5)
RDW: 12.3 % (ref 11.5–15.5)
WBC: 8.9 10*3/uL (ref 4.0–10.5)
WBC: 9.8 10*3/uL (ref 4.0–10.5)
nRBC: 0 % (ref 0.0–0.2)
nRBC: 0 % (ref 0.0–0.2)

## 2020-10-18 LAB — COMPREHENSIVE METABOLIC PANEL
ALT: 13 U/L (ref 0–44)
AST: 20 U/L (ref 15–41)
Albumin: 4.2 g/dL (ref 3.5–5.0)
Alkaline Phosphatase: 45 U/L (ref 38–126)
Anion gap: 9 (ref 5–15)
BUN: <5 mg/dL — ABNORMAL LOW (ref 6–20)
CO2: 20 mmol/L — ABNORMAL LOW (ref 22–32)
Calcium: 8.3 mg/dL — ABNORMAL LOW (ref 8.9–10.3)
Chloride: 114 mmol/L — ABNORMAL HIGH (ref 98–111)
Creatinine, Ser: 0.59 mg/dL (ref 0.44–1.00)
GFR, Estimated: >60 mL/min (ref >60)
Glucose, Bld: 122 mg/dL — ABNORMAL HIGH (ref 70–99)
Potassium: 2.8 mmol/L — ABNORMAL LOW (ref 3.5–5.1)
Sodium: 143 mmol/L (ref 135–145)
Total Bilirubin: 0.7 mg/dL (ref 0.3–1.2)
Total Protein: 6.3 g/dL — ABNORMAL LOW (ref 6.5–8.1)

## 2020-10-18 LAB — BASIC METABOLIC PANEL
Anion gap: 11 (ref 5–15)
BUN: 5 mg/dL — ABNORMAL LOW (ref 6–20)
CO2: 21 mmol/L — ABNORMAL LOW (ref 22–32)
Calcium: 8.6 mg/dL — ABNORMAL LOW (ref 8.9–10.3)
Chloride: 112 mmol/L — ABNORMAL HIGH (ref 98–111)
Creatinine, Ser: 0.58 mg/dL (ref 0.44–1.00)
GFR, Estimated: >60 mL/min (ref >60)
Glucose, Bld: 107 mg/dL — ABNORMAL HIGH (ref 70–99)
Potassium: 3 mmol/L — ABNORMAL LOW (ref 3.5–5.1)
Sodium: 144 mmol/L (ref 135–145)

## 2020-10-18 LAB — RAPID URINE DRUG SCREEN, HOSP PERFORMED
Amphetamines: NOT DETECTED
Barbiturates: NOT DETECTED
Benzodiazepines: NOT DETECTED
Cocaine: NOT DETECTED
Opiates: NOT DETECTED
Tetrahydrocannabinol: POSITIVE — AB

## 2020-10-18 LAB — PREPARE FRESH FROZEN PLASMA
Unit division: 0
Unit division: 0

## 2020-10-18 LAB — BPAM FFP
Blood Product Expiration Date: 202204202359
Blood Product Expiration Date: 202204202359
ISSUE DATE / TIME: 202204152025
ISSUE DATE / TIME: 202204152025
Unit Type and Rh: 7300
Unit Type and Rh: 7300

## 2020-10-18 LAB — URINALYSIS, ROUTINE W REFLEX MICROSCOPIC
Bilirubin Urine: NEGATIVE
Glucose, UA: NEGATIVE mg/dL
Hgb urine dipstick: NEGATIVE
Ketones, ur: 5 mg/dL — AB
Leukocytes,Ua: NEGATIVE
Nitrite: NEGATIVE
Protein, ur: NEGATIVE mg/dL
Specific Gravity, Urine: 1.026 (ref 1.005–1.030)
pH: 7 (ref 5.0–8.0)

## 2020-10-18 LAB — TRIGLYCERIDES: Triglycerides: 29 mg/dL (ref <150)

## 2020-10-18 LAB — LIPID PANEL
Cholesterol: 108 mg/dL (ref 0–200)
HDL: 35 mg/dL — ABNORMAL LOW (ref >40)
LDL Cholesterol: 67 mg/dL (ref 0–99)
Total CHOL/HDL Ratio: 3.1 RATIO
Triglycerides: 29 mg/dL (ref <150)
VLDL: 6 mg/dL (ref 0–40)

## 2020-10-18 LAB — MRSA PCR SCREENING: MRSA by PCR: NEGATIVE

## 2020-10-18 LAB — LACTIC ACID, PLASMA
Lactic Acid, Venous: 2.4 mmol/L (ref 0.5–1.9)
Lactic Acid, Venous: 0.9 mmol/L (ref 0.5–1.9)

## 2020-10-18 LAB — C-REACTIVE PROTEIN: CRP: 0.6 mg/dL (ref <1.0)

## 2020-10-18 LAB — TSH: TSH: 0.619 u[IU]/mL (ref 0.350–4.500)

## 2020-10-18 MED ORDER — POTASSIUM CHLORIDE CRYS ER 20 MEQ PO TBCR
40.0000 meq | EXTENDED_RELEASE_TABLET | ORAL | Status: DC
Start: 2020-10-18 — End: 2020-10-18
  Filled 2020-10-18: qty 2

## 2020-10-18 MED ORDER — SODIUM CHLORIDE 0.9 % IV SOLN
INTRAVENOUS | Status: DC | PRN
  Administered 2020-10-18: 1000 mL via INTRAVENOUS

## 2020-10-18 MED ORDER — SODIUM CHLORIDE 3 % IV SOLN
INTRAVENOUS | Status: DC
  Filled 2020-10-18 (×4): qty 500

## 2020-10-18 MED ORDER — CLEVIDIPINE BUTYRATE 0.5 MG/ML IV EMUL
0.0000 mg/h | INTRAVENOUS | Status: DC
  Filled 2020-10-18: qty 50

## 2020-10-18 MED ORDER — ARTIFICIAL TEARS OPHTHALMIC OINT
1.0000 "application " | TOPICAL_OINTMENT | OPHTHALMIC | Status: DC | PRN
  Filled 2020-10-18: qty 3.5

## 2020-10-18 MED ORDER — CHLORHEXIDINE GLUCONATE CLOTH 2 % EX PADS
6.0000 | MEDICATED_PAD | Freq: Every day | CUTANEOUS | Status: DC
  Administered 2020-10-19 – 2020-10-29 (×12): 6 via TOPICAL

## 2020-10-18 MED ORDER — POTASSIUM CHLORIDE 20 MEQ PO PACK
40.0000 meq | PACK | ORAL | Status: AC
  Administered 2020-10-18 (×2): 40 meq
  Filled 2020-10-18 (×2): qty 2

## 2020-10-18 NOTE — Progress Notes (Signed)
Patient ID: Jane Martin, female   DOB: 02-Sep-1995, 25 y.o.   MRN: [id number] BP 98/60   Pulse 93   Temp 98.06 F (36.7 C)   Resp 18   Ht 5\' 8"  (1.727 m)   Wt 72.6 kg   SpO2 100%   BMI 24.33 kg/m  Sedated, intubated Perrl, small +cough, corneals Purposeful movements with left upper extremity Dressing is dry and intact Head CT shows clot effectively evacuated, left to right shift present Ventricles effaced. Catheter left intraoperatively, currently not draining.  Exam stable.

## 2020-10-18 NOTE — Progress Notes (Signed)
OT Cancellation Note  Patient Details Name: DAWNELL BRYANT MRN: [id number] DOB: 1995-09-21   Cancelled Treatment:    Reason Eval/Treat Not Completed: Patient not medically ready.  Will check back.  Eber Jones., OTR/L Acute Rehabilitation Services Pager [phone] Office [phone]   Jeani Hawking M 10/18/2020, 7:56 AM

## 2020-10-18 NOTE — Progress Notes (Signed)
eLink Physician-Brief Progress Note Patient Name: Jane Martin DOB: 29-Mar-1996 MRN: [id number]   Date of Service  10/18/2020  HPI/Events of Note  Patient needs soft restraints order to prevent self-extubation  eICU Interventions  Soft restraints ordered.        Thomasene Lot Nagee Goates 10/18/2020, 11:15 PM

## 2020-10-18 NOTE — Progress Notes (Signed)
NAME:  Jane Martin, MRN:  [id number], DOB:  09/05/1995, LOS: 1 ADMISSION DATE:  10/17/2020, CONSULTATION DATE:  10/17/20 REFERRING MD:  Amada Jupiter - neuro , CHIEF COMPLAINT:    History of Present Illness:  25 yo F  medical history of sexually transmitted infections who was brought to ED 10/17/20 after saying "help me" and collapsing at a gas station. She was unresponsive by time of EMS arrival and per chart review at time of EMS pt had 52mm fixed pupils, disconjugate gaze, minimal response to pain and agonal respiraitons. In ED, patient intubated for airway protection given low GCS. Code stroke initiated and pt found to have a large acute L frontoparietal intraparenchymal hematoma, decompressed into L lateral ventricle and extends into third ventricle, with 1cm L to R midline shift. NSGY and Neuro consulted. The patient is being taken emergently to the OR and will return to the ICU intubated following the case.   Labs in ED significant for K 3, CO2 19, Glu 156, LA 5.3, INR 1.3  PCCM has been consulted in this setting.   Pertinent  Medical History  Chlamydia Gonhorrhea Candida vaginitis BV  Significant Hospital Events: Including procedures, antibiotic start and stop dates in addition to other pertinent events   . 4/15 BIB EMS after collapsing at gas station and losing consciousness. Large intraparenchymal hematoma.  Going to OR emergently. CCM consulted for expectant vent management  . 4/16 S/p craniotomy and drain. Repeat CT head with largely resolved mass effect and resolved midline shift, s/p hemorrhage evacuation with drain in place  Interim History / Subjective:  S/p craniotomy on mechanical ventilation, sedated  Objective   Blood pressure 98/60, pulse 93, temperature 98.06 F (36.7 C), resp. rate 18, height 5\' 8"  (1.727 m), weight 72.6 kg, SpO2 100 %.    Vent Mode: PRVC FiO2 (%):  [30 %-100 %] 30 % Set Rate:  [14 bmp-26 bmp] 14 bmp Vt Set:  [500 mL-510 mL] 510 mL PEEP:  [5  cmH20] 5 cmH20 Plateau Pressure:  [16 cmH20-17 cmH20] 16 cmH20   Intake/Output Summary (Last 24 hours) at 10/18/2020 1039 Last data filed at 10/18/2020 0700 Gross per 24 hour  Intake 5079.69 ml  Output 4355 ml  Net 724.69 ml   Filed Weights   10/17/20 1800 10/17/20 1825  Weight: 72.6 kg 72.6 kg    Examination: General: Critically ill appearing WDWN young adult F intubated, chemically paralyzed sedated  HENT: ETT secure anicteric sclera. Post op cranial dressing intact  Lungs: CTAb symmetrical chest expansion, even and unlabored  Cardiovascular: bradycardic s1s2 no rgm  Abdomen: soft round + bowel sounds  Extremities: No acute joint deformity no cyanosis or clubbing  Neuro: Chemically paralyzed. Pinpoint pupuls GU: Foley with clear urine Skin: c/d/w no rash   Physical Exam: General: Young female on mechanical ventilation and sedation HENT: [place], AT, ETT in place Eyes: Pinpoint pupils equal and reactive, no scleral icterus Respiratory: Clear to auscultation bilaterally.  No crackles, wheezing or rales Cardiovascular: RRR, -M/R/G, no JVD GI: BS+, soft, nontender Extremities:-Edema,-tenderness Neuro: Sedated  Labs/imaging that I havepersonally reviewed  (right click and "Reselect all SmartList Selections" daily)  4/15 CT H> 5.6x4.4cm acute L frontoparietal intraparenchymal hematoma, decompressed into L ventricle which contains significant amount of blood, which extends into third ventricle as well. 1cm L to R midline shift. Effacement of basilar cisterns including suprasellar and ambient cisterns   CT head post-op with largely resolved mass effect and resolved midline shift, s/p hemorrhage evacuation with  drain in place  K 3.0 Hg 8.9  LA 5.3>2.4  Imaging, labs and test noted above have been reviewed independently by me.  Resolved Hospital Problem list     Assessment & Plan:   IPH s/p emergent craniotomy 4/15 -Large acute L frontoparietal intraparenchymal hematoma,  decompressed into L ventricle, extending into third ventricle with associated L to R midline shift, with effacement of basilar cisterns  -Post-op CT with evacuation of hemorrhage and largely resolved mass effect and midline shift P -post op per NSGY  -imaging per NSGY / neuro. MRI tomorrow -mannitol x1 completed -Wean cardene for goal SBP <140  -hold chemical vte ppx   Acute respiratory failure requiring intubation COVID-19 positive -- no known sx of this  P -airborne/contact  -Full vent support. Hold on WUA/SBT per NSGY. -Decreased RR for respiratory alkalosis. ABG ordered. -Prop, Fent  -will defer covid related therapies as no hx of pt having symptoms, and cxr without evidence of active asd   Hypokalemia P -Replete -Trend  Coagulopathy, very mild LFTs normal P -will check AM coags   Best practice (right click and "Reselect all SmartList Selections" daily)  Diet:  Tube Feed  Pain/Anxiety/Delirium protocol (if indicated): Yes (RASS goal -1) VAP protocol (if indicated): Yes DVT prophylaxis: SCD GI prophylaxis: PPI Glucose control:  SSI No Central venous access:  N/A Arterial line:  Yes, and it is still needed Foley:  Yes, and it is still needed Mobility:  bed rest  PT consulted: N/A Last date of multidisciplinary goals of care discussion [per primary] Code Status:  full code Disposition: ICU   Critical care time: 50 min     The patient is critically ill with multiple organ systems failure and requires high complexity decision making for assessment and support, frequent evaluation and titration of therapies, application of advanced monitoring technologies and extensive interpretation of multiple databases.  Independent Critical Care Time: 50 Minutes.   Mechele Collin, M.D. [facility] Pulmonary/Critical Care Medicine 10/18/2020 10:40 AM   Please see Amion for pager number to reach on-call Pulmonary and Critical Care Team.

## 2020-10-18 NOTE — Progress Notes (Signed)
Dr. Danielle Dess came to unit. While on unit, Dr. Danielle Dess made aware that patient's drain has not put on any output.

## 2020-10-18 NOTE — Progress Notes (Addendum)
STROKE TEAM PROGRESS NOTE   SUBJECTIVE (INTERVAL HISTORY) Patient is sedated on propofol gtt @30mcg /kg/min and Fentanyl gtt @ .  Patient is currently on cardene gtt @12 .5mg .  Patient underwent left parietal craniotomy for evacuation of ICH with placement of EVD set @  with no output.  Follow up CTH this am reveals postop changes, with small amount of residual blood and resolved midline shift and stable IVH. No neuro events noted overnight.  Today will start 3% saline protocol with goal NA 150-155, BP goal < 160 and attempt to wean off cardene gtt.  When stable will need a MRI Brain and CTA head. Spoke with RN @ bedside. Check Urine Hcg and drug screen. No family present   OBJECTIVE Vitals:   10/18/20 0630 10/18/20 0645 10/18/20 0700 10/18/20 0800  BP: (!) 97/56 (!) 95/59 98/60   Pulse: 94 93 93   Resp: 18 18 18    Temp: 98.06 F (36.7 C) 98.06 F (36.7 C) 98.06 F (36.7 C)   TempSrc:      SpO2: 100% 100% 100%   Weight:      Height:    5\' 8"  (1.727 m)    CBC:  Recent Labs  Lab 10/17/20 1740 10/17/20 1749 10/17/20 2327 10/18/20 0427  WBC 6.3  --  8.9 9.8  NEUTROABS 1.8  --   --   --   HGB 13.8   < > 9.6* 8.9*  HCT 41.7   < > 28.1* 25.4*  MCV 93.3  --  90.4 90.4  PLT 250  --  156 157   < > = values in this interval not displayed.    Basic Metabolic Panel:  Recent Labs  Lab 10/17/20 2327 10/18/20 0427  NA 143 144  K 2.8* 3.0*  CL 114* 112*  CO2 20* 21*  GLUCOSE 122* 107*  BUN <5* 5*  CREATININE 0.59 0.58  CALCIUM 8.3* 8.6*    Lipid Panel:  Recent Labs  Lab 10/18/20 0427 10/18/20 0428  CHOL 108  --   TRIG 29 29  HDL 35*  --   CHOLHDL 3.1  --   VLDL 6  --   LDLCALC 67  --    HgbA1c: No results found for: HGBA1C Urine Drug Screen:     Component Value Date/Time   LABOPIA NONE DETECTED 10/18/2020 0030   COCAINSCRNUR NONE DETECTED 10/18/2020 0030   LABBENZ NONE DETECTED 10/18/2020 0030   AMPHETMU NONE DETECTED 10/18/2020 0030   THCU POSITIVE  (A) 10/18/2020 0030   LABBARB NONE DETECTED 10/18/2020 0030    Alcohol Level     Component Value Date/Time   ETH <10 10/17/2020 1740    IMAGING  Results for orders placed or performed during the hospital encounter of 10/17/20  CT HEAD WO CONTRAST   Narrative   CLINICAL DATA:  25 year old female code stroke presentation with large left hemisphere hemorrhage, intraventricular extension. Posturing on presentation. Postoperative day 1 status post craniotomy and evacuation of intracranial hematoma.  EXAM: CT HEAD WITHOUT CONTRAST  TECHNIQUE: Contiguous axial images were obtained from the base of the skull through the vertex without intravenous contrast.  COMPARISON:  CT head, CTA head and neck 10/17/2020.  FINDINGS: Brain: Small volume left side pneumocephalus. Vertex approach drainage catheter tracking to the left corona radiata and internal capsule. Substantial interval drainage of intra-axial hemorrhage previously centered at the posterior left middle frontal gyrus, anterior parietal lobe. Small volume residual intra-axial blood, in an area of about 3.4 cm along the  course of the drainage catheter (series 3, image 28).  Largely resolved regional mass effect. Largely resolved rightward midline shift. Trace extra-axial blood underlying the left craniotomy.  Moderate volume left lateral intraventricular hemorrhage is stable. Small volume blood both the 3rd and 4th ventricles, more apparent. Right lateral ventricle appears spared. No ventriculomegaly.  Improved basilar cisterns. Partial suprasellar cistern effacement persists. No uncal herniation.  No superimposed acute cortically based infarct identified.  Vascular: No suspicious intracranial vascular hyperdensity.  Skull: Left superior convexity craniotomy. Otherwise stable and intact.  Sinuses/Orbits: Right nasoenteric tube remains in place. Fluid in the visible pharynx. Mild new paranasal sinus fluid and  mucosal thickening. Tympanic cavities and mastoids remain clear.  Other: Postoperative left scalp hematoma, superimposed small volume left scalp fluid and gas collection. Overlying skin staples. Orbits soft tissues remain negative.  IMPRESSION: 1. Significantly evacuated left hemisphere hemorrhage by surgery. Percutaneous drain remains in place tracking toward the left internal capsule. Small volume (roughly 3.4 cm diameter) residual intra-axial blood, fluid, and gas.  2. Largely resolved intracranial mass effect and midline shift. Residual partially effaced suprasellar cistern.  3. Stable to minimally increased intraventricular hemorrhage. No ventriculomegaly.  4. Trace extra-axial blood underlying the craniotomy. Small volume left-side pneumocephalus. Postoperative scalp hematoma.  5. No new intracranial abnormality identified.   Electronically Signed   By: Odessa FlemingH  Hall M.D.   On: 10/18/2020 05:54        PHYSICAL EXAM GENERAL: Sedated and intubated on mechanical ventilator HEENT: - Normocephalic and atraumatic, dry mm, EVD present, NG tube, and ETT LUNGS - Clear to auscultation bilaterally with no wheezes CV - S1S2 RRR, no m/r/g, equal pulses bilaterally. ABDOMEN - Soft, nontender, nondistended with normoactive BS  NEURO:  Mental Status: sedated and intubated  Language: unable to assess  Cranial Nerves  Pupils Right pupil 2 and sluggish, left pupil 3 and sluggish.  EOMI visual fields no blink to threat on right, bilateral corneals  facial asymmetry unable to assess, Motor: Responds to noxious stimuli, moves left arm purposeful Tone: is normal and bulk is normal Sensation- right abnormal  Coordination: unable to assess Gait- deferred   ASSESSMENT/PLAN Jane Martin is a 25 y.o. female with no history, presented to ED by EMS after collapsing at a gas station. It was told that she yelled out for help and for someone to call 911, then collapsed to the ground.  When EMS arrived, she was unresponsive which worsened to comatose. Intubated in ED.    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 1.0 cm of left-to-right midline shift with effacement of the basilar cisterns  CTA Head and neck  Unremarkable, no AVM or aneurysm  S/p hematoma evacuation   Repeat CT 4/16 - largely resolved ICH and midline shift. Residue IVH  MRI head and MRV Head pending in am   2D Echo pending  LDL 67  HgbA1c pending   Continue Keppra   SCD's for VTE prophylaxis  No antithrombotic prior to admission, now on No antithrombotic.   Ongoing aggressive stroke risk factor management  Therapy recommendations:  Pending  Disposition:  TBD  ? Abnormal brain tissue  Per Dr. Danielle DessElsner op note "a small cluster of tissue was found that was cauterized and removed in a singular piece.  After this it seemed that the hemorrhage subsided considerably.  Several biopsies of the surrounding brain were obtained."  Not sure if sent for pathology  MRI with and without contrast pending in am  Respiratory failure  Intubated on vent  CCM on board  Hypertension . BP goal < 160 . Change cardene to cleviprex PRN for volume control . A line in place   Birth control  Mom stated that pt has a hormone depo for birth control  Not sure the name  Concerning both estrogen and progesterone containing  Pt not smoker but viping - mom not sure if nicotine containing  MRV to rule out venous thrombosis  COVID positive  Asymptomatic   Mom does not know any potential exposure  Intubated now  Dysphagia   May consider TF tomorrow  Other Stroke Risk Factors  Viping - ? Nicotine or non-nicotine (mom not sure)   Family hx stroke (Mom had a stroke)  Other Active Problems    Hospital day # 1 Gevena Mart, DNP, ACNPC-AG  ATTENDING NOTE: I  reviewed above note and agree with the assessment and plan. Pt was seen and examined.   25 year old female with no known past medical history admitted for collapse at gas station, became unresponsive and comatose.  Intubated for airway protection.  CT head showed left frontal large ICH with IVH.  CTA head and neck no AVM or aneurysm.  Had hematoma evacuation by Dr. Danielle Dess neurosurgery.  Repeat CT this morning showed successful hematoma evacuation with resolved midline shift.   Per Dr. Danielle Dess op note "a small cluster of tissue was found that was cauterized and removed in a singular piece.  After this it seemed that the hemorrhage subsided considerably.  Several biopsies of the surrounding brain were obtained." Not sure if sent for pathology.  We will do MRI brain with and without contrast in a.m.  Per mom, patient viping but not smoker, not sure if viping or not.  And also patient has birth control Depo on the arm, concerning estrogen and progesterone containing.  We will do MRV head with and without contrast in a.m.  On exam, pt mom at the bedside, intubated on sedation, eyes open briefly on voice and pain, following commands. With forced eye opening, eyes in b/l abduction position, L>R, not blinking to visual threat, doll's eyes sluggish but present, not tracking, b/l small pupils 1.80mm, sluggish to light. Corneal reflex present, gag and cough present. Breathing over the vent.  Facial symmetry not able to test due to ET tube.  Tongue protrusion not cooperative. On pain stimulation, no movement of RUE and RLE. Spontaneous movement of LUE and LLE, LUE at least 3/5 and LLE at least 3-/5. No babinski. Sensation, coordination not cooperative and gait not tested.  Etiology for patient ICH not quite clear, DDx including brain mass lesion, AVM, hemorrhagic venous thrombosis, or hemorrhagic conversion from infarct due to COVID.  Further work-up underway.  Currently on 3% saline for cerebral edema, sodium goal  150-155.  Switch from Cardene to Cleviprex for volume control, BP goal less than 160.  CCM managing ventilation.  Consider tube feeding tomorrow.  For detailed assessment and plan, please refer to above as I have made changes wherever appropriate.   This patient is critically ill due to large left frontal ICH and IVH, s/p hematoma evacuation, cerebral edema and at significant risk of neurological worsening, death form brain herniation, hematoma expansion, hydrocephalus, seizure. This patient's care requires constant monitoring of vital signs, hemodynamics, respiratory and cardiac monitoring, review of multiple databases, neurological assessment, discussion with family, other specialists and medical decision  making of high complexity. I spent 50 minutes of neurocritical care time in the care of this patient. I had long discussion with mom at bedside, updated pt current condition, treatment plan and potential prognosis, and answered all the questions.  She expressed understanding and appreciation.  I also discussed with Dr. Franky Macho neurosurgery.   Marvel Plan, MD PhD Stroke Neurology 10/18/2020 11:42 AM     To contact Stroke Continuity provider, please refer to [email or web address]. After hours, contact General Neurology

## 2020-10-18 NOTE — Progress Notes (Signed)
Dr. Selina Cooley paged and made aware CT is available to view.

## 2020-10-18 NOTE — Progress Notes (Signed)
PT Cancellation Note  Patient Details Name: Jane Martin MRN: [id number] DOB: 03/02/1996   Cancelled Treatment:    Reason Eval/Treat Not Completed: Patient not medically ready;Active bedrest order. Pt with active bedrest orders and not medically appropriate for PT eval at this time, per RN. Will follow-up another day as appropriate.   Raymond Gurney, PT, DPT Acute Rehabilitation Services  Pager: [phone] Office: [phone]    [name] 10/18/2020, 8:01 AM

## 2020-10-18 NOTE — Progress Notes (Signed)
SLP Cancellation Note  Patient Details Name: Jane Martin MRN: [id number] DOB: [birth year]   Cancelled treatment:       Reason Eval/Treat Not Completed: Patient not medically ready. Will follow for readiness.    Wanza Szumski, Riley Nearing 10/18/2020, 6:59 AM

## 2020-10-18 NOTE — Consult Note (Signed)
Reason for Consult: Intracerebral hemorrhage Referring Physician: Dr. Thersa Salt Jane Martin is an 25 y.o. female.  HPI: The patient apparently had a rapid deterioration of function well at a gas station today.  She was able to advise someone nearby that she was having difficulty and EMS was called.  The patient on arrival required intubation was unconscious.  Dr. Amada Jupiter had seen the patient and a CT scan demonstrated presence of a large intracerebral hemorrhage on the left parietal region.  There is extension into the ventricle.  On the initial exam the patient was already herniating.  I was contacted about 615 and by approximately 645 the patient was in the operating room being prepared for craniotomy.  No past medical history on file.  No past surgical history on file.  Family History  Problem Relation Age of Onset  . Hypertension Mother   . Healthy Father     Social History:  reports that she has never smoked. She has never used smokeless tobacco. She reports current alcohol use. She reports current drug use. Drug: Marijuana.  Allergies:  Allergies  Allergen Reactions  . Flagyl [Metronidazole]     Medications: No medication list is available.  Results for orders placed or performed during the hospital encounter of 10/17/20 (from the past 48 hour(s))  CBG monitoring, ED     Status: Abnormal   Collection Time: 10/17/20  5:39 PM  Result Value Ref Range   Glucose-Capillary 153 (H) 70 - 99 mg/dL    Comment: Glucose reference range applies only to samples taken after fasting for at least 8 hours.  Comprehensive metabolic panel     Status: Abnormal   Collection Time: 10/17/20  5:40 PM  Result Value Ref Range   Sodium 136 135 - 145 mmol/L   Potassium 3.0 (L) 3.5 - 5.1 mmol/L   Chloride 107 98 - 111 mmol/L   CO2 19 (L) 22 - 32 mmol/L   Glucose, Bld 156 (H) 70 - 99 mg/dL    Comment: Glucose reference range applies only to samples taken after fasting for at least 8 hours.    BUN 7 6 - 20 mg/dL   Creatinine, Ser 1.61 0.44 - 1.00 mg/dL   Calcium 9.3 8.9 - 09.6 mg/dL   Total Protein 7.6 6.5 - 8.1 g/dL   Albumin 4.5 3.5 - 5.0 g/dL   AST 20 15 - 41 U/L   ALT 12 0 - 44 U/L   Alkaline Phosphatase 56 38 - 126 U/L   Total Bilirubin 0.8 0.3 - 1.2 mg/dL   GFR, Estimated >04 >54 mL/min    Comment: (NOTE) Calculated using the CKD-EPI Creatinine Equation (2021)    Anion gap 10 5 - 15    Comment: Performed at [facility], [address] [place], [place] [address]  Ethanol     Status: None   Collection Time: 10/17/20  5:40 PM  Result Value Ref Range   Alcohol, Ethyl (B) <10 <10 mg/dL    Comment: (NOTE) Lowest detectable limit for serum alcohol is 10 mg/dL.  For medical purposes only. Performed at [facility], [address], [place], [place] [address]   Lactic acid, plasma     Status: Abnormal   Collection Time: 10/17/20  5:40 PM  Result Value Ref Range   Lactic Acid, Venous 5.3 (HH) 0.5 - 1.9 mmol/L    Comment: CRITICAL RESULT CALLED TO, READ BACK BY AND VERIFIED WITH: K.COBBS,RN  10/17/2020 [email or web address].J Performed at [facility], [address], [place], [place] [address]   Protime-INR     Status: Abnormal   Collection Time: 10/17/20  5:40 PM  Result Value Ref Range   Prothrombin Time 16.0 (H) 11.4 - 15.2 seconds   INR 1.3 (H) 0.8 - 1.2    Comment: (NOTE) INR goal varies based on device and disease states. Performed at [facility], [address] [place], [place] [address]   CBC WITH DIFFERENTIAL     Status: None   Collection Time: 10/17/20  5:40 PM  Result Value Ref Range   WBC 6.3 4.0 - 10.5 K/uL   RBC 4.47 3.87 - 5.11 MIL/uL   Hemoglobin 13.8 12.0 - 15.0 g/dL   HCT 34.7 42.5 - 95.6 %   MCV 93.3 80.0 - 100.0 fL   MCH 30.9 26.0 - 34.0 pg   MCHC 33.1 30.0 - 36.0 g/dL   RDW 38.7 56.4 - 33.2 %   Platelets 250 150 - 400 K/uL   nRBC 0.0 0.0 - 0.2 %   Neutrophils Relative % 29 %   Neutro Abs 1.8 1.7 - 7.7 K/uL    Lymphocytes Relative 62 %   Lymphs Abs 3.9 0.7 - 4.0 K/uL   Monocytes Relative 8 %   Monocytes Absolute 0.5 0.1 - 1.0 K/uL   Eosinophils Relative 0 %   Eosinophils Absolute 0.0 0.0 - 0.5 K/uL   Basophils Relative 1 %   Basophils Absolute 0.1 0.0 - 0.1 K/uL   Immature Granulocytes 0 %   Abs Immature Granulocytes 0.02 0.00 - 0.07 K/uL    Comment: Performed at [facility], [address], [place], [place] [address]  I-Stat Chem 8, ED     Status: Abnormal   Collection Time: 10/17/20  5:49 PM  Result Value Ref Range   Sodium 140 135 - 145 mmol/L   Potassium 3.0 (L) 3.5 - 5.1 mmol/L   Chloride 104 98 - 111 mmol/L   BUN 7 6 - 20 mg/dL   Creatinine, Ser 4.16 0.44 - 1.00 mg/dL   Glucose, Bld 606 (H) 70 - 99 mg/dL    Comment: Glucose reference range applies only to samples taken after fasting for at least 8 hours.   Calcium, Ion 1.22 1.15 - 1.40 mmol/L   TCO2 21 (L) 22 - 32 mmol/L   Hemoglobin 14.3 12.0 - 15.0 g/dL   HCT 30.1 60.1 - 09.3 %  Resp Panel by RT-PCR (Flu A&B, Covid) Nasopharyngeal Swab     Status: Abnormal   Collection Time: 10/17/20  6:02 PM   Specimen: Nasopharyngeal Swab; Nasopharyngeal(NP) swabs in vial transport medium  Result Value Ref Range   SARS Coronavirus 2 by RT PCR POSITIVE (A) NEGATIVE    Comment: RESULT CALLED TO, READ BACK BY AND VERIFIED WITH: MARGE RN 1942 10/17/20 EB (NOTE) SARS-CoV-2 target nucleic acids are DETECTED.  The SARS-CoV-2 RNA is generally detectable in upper respiratory specimens during the acute phase of infection. Positive results are indicative of the presence of the identified virus, but do not rule out bacterial infection or co-infection with other pathogens not detected by the test. Clinical correlation with patient history and other diagnostic information is necessary to determine patient infection status. The expected result is Negative.  Fact Sheet for Patients: BloggerCourse.com  Fact Sheet for  Healthcare Providers: SeriousBroker.it  This test is not yet approved or cleared by the [place] FDA and  has been authorized for detection and/or diagnosis of  SARS-CoV-2 by FDA under an Emergency Use Authorization (EUA).  This EUA will remain in effect (meaning this test can be used) fo r the duration of  the COVID-19 declaration under Section 564(b)(1) of the Act, 21 U.S.C. section 360bbb-3(b)(1), unless the authorization is terminated or revoked sooner.     Influenza A by PCR NEGATIVE NEGATIVE   Influenza B by PCR NEGATIVE NEGATIVE    Comment: (NOTE) The Xpert Xpress SARS-CoV-2/FLU/RSV plus assay is intended as an aid in the diagnosis of influenza from Nasopharyngeal swab specimens and should not be used as a sole basis for treatment. Nasal washings and aspirates are unacceptable for Xpert Xpress SARS-CoV-2/FLU/RSV testing.  Fact Sheet for Patients: BloggerCourse.com  Fact Sheet for Healthcare Providers: SeriousBroker.it  This test is not yet approved or cleared by the [place] FDA and has been authorized for detection and/or diagnosis of SARS-CoV-2 by FDA under an Emergency Use Authorization (EUA). This EUA will remain in effect (meaning this test can be used) for the duration of the COVID-19 declaration under Section 564(b)(1) of the Act, 21 U.S.C. section 360bbb-3(b)(1), unless the authorization is terminated or revoked.  Performed at [facility], [address], [place], [place] [address]   Type and screen Ordered by PROVIDER DEFAULT     Status: None   Collection Time: 10/17/20  6:20 PM  Result Value Ref Range   ABO/RH(D) B NEG    Antibody Screen NEG    Sample Expiration      10/20/2020,2359 Performed at [facility], [address] [place], [place] [address]   HIV Antibody (routine testing w rflx)     Status: None   Collection Time: 10/17/20  7:02 PM  Result Value  Ref Range   HIV Screen 4th Generation wRfx Non Reactive Non Reactive    Comment: Performed at [facility], [address] [place], [place] [address]  Prepare fresh frozen plasma     Status: None (Preliminary result)   Collection Time: 10/17/20  7:58 PM  Result Value Ref Range   Unit Number [id number]    Blood Component Type THAWED PLASMA    Unit division 00    Status of Unit ISSUED    Transfusion Status      OK TO TRANSFUSE Performed at [facility], [address], [place], [place] [address]    Unit Number [id number]    Blood Component Type THAWED PLASMA    Unit division 00    Status of Unit ISSUED    Transfusion Status OK TO TRANSFUSE   ABO/Rh     Status: None   Collection Time: 10/17/20  8:00 PM  Result Value Ref Range   ABO/RH(D)      B NEG Performed at [facility], [address], [place], [place] [address]   CBC     Status: Abnormal   Collection Time: 10/17/20 11:27 PM  Result Value Ref Range   WBC 8.9 4.0 - 10.5 K/uL   RBC 3.11 (L) 3.87 - 5.11 MIL/uL   Hemoglobin 9.6 (L) 12.0 - 15.0 g/dL    Comment: REPEATED TO VERIFY RESULTS CALLED TO AND READ BACK BY LORRAINE [facility] RN AT 0032 04 [id number] 2022 BY A HUGHES    HCT 28.1 (L) 36.0 - 46.0 %   MCV 90.4 80.0 - 100.0 fL   MCH 30.9 26.0 - 34.0 pg   MCHC 34.2 30.0 - 36.0 g/dL   RDW 27.2 53.6 - 64.4 %  Platelets 156 150 - 400 K/uL   nRBC 0.0 0.0 - 0.2 %    Comment: Performed at [facility], [address] [place], [place] [address]  Comprehensive metabolic panel     Status: Abnormal   Collection Time: 10/17/20 11:27 PM  Result Value Ref Range   Sodium 143 135 - 145 mmol/L   Potassium 2.8 (L) 3.5 - 5.1 mmol/L   Chloride 114 (H) 98 - 111 mmol/L   CO2 20 (L) 22 - 32 mmol/L   Glucose, Bld 122 (H) 70 - 99 mg/dL    Comment: Glucose reference range applies only to samples taken after fasting for at least 8 hours.   BUN <5 (L) 6 - 20 mg/dL   Creatinine, Ser 6.040.59 0.44 - 1.00 mg/dL   Calcium 8.3  (L) 8.9 - 10.3 mg/dL   Total Protein 6.3 (L) 6.5 - 8.1 g/dL   Albumin 4.2 3.5 - 5.0 g/dL   AST 20 15 - 41 U/L   ALT 13 0 - 44 U/L   Alkaline Phosphatase 45 38 - 126 U/L   Total Bilirubin 0.7 0.3 - 1.2 mg/dL   GFR, Estimated >54>60 >09>60 mL/min    Comment: (NOTE) Calculated using the CKD-EPI Creatinine Equation (2021)    Anion gap 9 5 - 15    Comment: Performed at [facility], [address] [place], [place] [address]  Lactic acid, plasma     Status: Abnormal   Collection Time: 10/17/20 11:27 PM  Result Value Ref Range   Lactic Acid, Venous 2.4 (HH) 0.5 - 1.9 mmol/L    Comment: CRITICAL VALUE NOTED.  VALUE IS CONSISTENT WITH PREVIOUSLY REPORTED AND CALLED VALUE. Performed at [facility], [address] [place], [place] [address]     CT HEAD WO CONTRAST  Result Date: 10/17/2020 CLINICAL DATA:  Patient is unresponsive and with abnormal posture EXAM: CT HEAD WITHOUT CONTRAST CT CERVICAL SPINE WITHOUT CONTRAST TECHNIQUE: Multidetector CT imaging of the head and cervical spine was performed following the standard protocol without intravenous contrast. Multiplanar CT image reconstructions of the cervical spine were also generated. COMPARISON:  None. FINDINGS: CT HEAD FINDINGS Brain: 5.6 by 3.8 by 4.4 cm (volume = 49 cm^3) acute left frontoparietal intraparenchymal hematoma is noted. This is decompressed into the left lateral ventricle which likewise contains a substantial amount of acute blood products which also extend into the third ventricle. There is approximately 1.0 cm of left-to-right midline shift. Borderline accentuated density along the tentorium and falx but overall I am skeptical that this represents subarachnoid hemorrhage. Effacement of the basilar cisterns including the suprasellar cistern and ambient cisterns. Vascular: Unremarkable Skull: Unremarkable Sinuses/Orbits: Mild chronic right maxillary sinusitis. CT CERVICAL SPINE FINDINGS Alignment: Reversal the normal cervical  lordosis but no subluxation. Skull base and vertebrae: No fracture or acute bony findings. Soft tissues and spinal canal: Unremarkable Disc levels:  No bony impingement. Upper chest: Unremarkable Other: Endotracheal and nasogastric tubes noted. IMPRESSION: 1. 5.6 cm in long axis acute left frontoparietal intraparenchymal hematoma, decompressing into the left lateral ventricle which contains a substantial amount of acute blood products which extend into the third ventricle. 1.0 cm of left-to-right midline shift with effacement of the basilar cisterns. 2. No acute cervical spine findings. 3. Chronic right maxillary sinusitis. Critical Value/emergent results were called by telephone at the time of interpretation on 10/17/2020 at 6:10 pm to provider Dr. Margarita GrizzleANIELLE RAY , who verbally acknowledged these results. Electronically Signed   By:  Gaylyn Rong M.D.   On: 10/17/2020 18:19   CT Cervical Spine Wo Contrast  Result Date: 10/17/2020 CLINICAL DATA:  Patient is unresponsive and with abnormal posture EXAM: CT HEAD WITHOUT CONTRAST CT CERVICAL SPINE WITHOUT CONTRAST TECHNIQUE: Multidetector CT imaging of the head and cervical spine was performed following the standard protocol without intravenous contrast. Multiplanar CT image reconstructions of the cervical spine were also generated. COMPARISON:  None. FINDINGS: CT HEAD FINDINGS Brain: 5.6 by 3.8 by 4.4 cm (volume = 49 cm^3) acute left frontoparietal intraparenchymal hematoma is noted. This is decompressed into the left lateral ventricle which likewise contains a substantial amount of acute blood products which also extend into the third ventricle. There is approximately 1.0 cm of left-to-right midline shift. Borderline accentuated density along the tentorium and falx but overall I am skeptical that this represents subarachnoid hemorrhage. Effacement of the basilar cisterns including the suprasellar cistern and ambient cisterns. Vascular: Unremarkable Skull:  Unremarkable Sinuses/Orbits: Mild chronic right maxillary sinusitis. CT CERVICAL SPINE FINDINGS Alignment: Reversal the normal cervical lordosis but no subluxation. Skull base and vertebrae: No fracture or acute bony findings. Soft tissues and spinal canal: Unremarkable Disc levels:  No bony impingement. Upper chest: Unremarkable Other: Endotracheal and nasogastric tubes noted. IMPRESSION: 1. 5.6 cm in long axis acute left frontoparietal intraparenchymal hematoma, decompressing into the left lateral ventricle which contains a substantial amount of acute blood products which extend into the third ventricle. 1.0 cm of left-to-right midline shift with effacement of the basilar cisterns. 2. No acute cervical spine findings. 3. Chronic right maxillary sinusitis. Critical Value/emergent results were called by telephone at the time of interpretation on 10/17/2020 at 6:10 pm to provider Dr. Margarita Grizzle , who verbally acknowledged these results. Electronically Signed   By: Gaylyn Rong M.D.   On: 10/17/2020 18:19   DG Chest Portable 1 View  Result Date: 10/17/2020 CLINICAL DATA:  ET tube check EXAM: PORTABLE CHEST 1 VIEW COMPARISON:  Chest radiograph 10/18/2018 at 5:52 p.m. FINDINGS: Interval retraction of the endotracheal tube with tip approximately 1.5 cm above the carina. Enteric catheter in place. Stable cardiomediastinal contours. Low lung volumes but otherwise clear. No pneumothorax or pleural effusion. No acute finding in the visualized skeleton. IMPRESSION: Interval retraction of the endotracheal tube with tip approximately 1.5 cm above the carina. Electronically Signed   By: Emmaline Kluver M.D.   On: 10/17/2020 19:06   DG Chest Port 1 View  Result Date: 10/17/2020 CLINICAL DATA:  Unresponsive. EXAM: PORTABLE CHEST 1 VIEW COMPARISON:  None. FINDINGS: Single frontal view of the chest demonstrates endotracheal tube overlying tracheal air column, tip overlying the right mainstem bronchus. Recommend  retracting endotracheal tube approximately 4 cm to ensure adequate placement above the carina. Enteric catheter passes below diaphragm tip and side port projecting over gastric fundus. Cardiac silhouette is unremarkable. No airspace disease, effusion, or pneumothorax. There are no acute bony abnormalities. IMPRESSION: 1. Right mainstem intubation. Recommend retracting endotracheal tube approximately 4 cm to ensure adequate placement above carina. 2. No acute intrathoracic process. Critical Value/emergent results were called by telephone at the time of interpretation on 10/17/2020 at 6:09 pm to provider [facility] RAY , who verbally acknowledged these results. Electronically Signed   By: Sharlet Salina M.D.   On: 10/17/2020 18:09   CT ANGIO HEAD NECK W WO CM (CODE STROKE)  Result Date: 10/17/2020 CLINICAL DATA:  Patient unresponsive with abnormal posture. EXAM: CT HEAD WITHOUT CONTRAST CT ANGIOGRAPHY HEAD AND NECK TECHNIQUE: Multidetector CT imaging of  the head and neck was performed using the standard protocol during bolus administration of intravenous contrast. Multiplanar CT image reconstructions and MIPs were obtained to evaluate the vascular anatomy. Carotid stenosis measurements (when applicable) are obtained utilizing NASCET criteria, using the distal internal carotid diameter as the denominator. CONTRAST:  75mL OMNIPAQUE IOHEXOL 350 MG/ML SOLN COMPARISON:  CT head/cervical spine performed earlier today 10/17/2020. FINDINGS: CTA NECK FINDINGS Aortic arch: Standard aortic branching. The visualized aortic arch is normal in caliber. No hemodynamically significant innominate or proximal subclavian artery stenosis. Right carotid system: CCA and ICA patent within the neck without stenosis. Left carotid system: CCA and ICA patent within the neck without stenosis. Vertebral arteries: The intracranial vertebral arteries are patent. Skeleton: No acute bony abnormality or aggressive osseous lesion. Other neck: No neck  mass or cervical lymphadenopathy. Upper chest: No consolidation within the imaged lung apices. Partially imaged ET tube extending into the right mainstem bronchus. Partially imaged enteric tube. Review of the MIP images confirms the above findings CTA HEAD FINDINGS Anterior circulation: The intracranial internal carotid arteries are patent. The M1 middle cerebral arteries are patent. No M2 proximal branch occlusion or high-grade proximal stenosis is identified. The anterior cerebral arteries are patent. No intracranial aneurysm is identified. No evidence of AVM. No contrast blush at the site of the patient's left cerebral hemisphere acute parenchymal hematoma. Posterior circulation: The intracranial vertebral arteries are patent. The basilar artery is patent. The posterior cerebral arteries are patent. Venous sinuses: The dural venous sinuses are poorly assessed due to contrast bolus timing. Anatomic variants: None significant Review of the MIP images confirms the above findings Findings of right mainstem bronchus intubation called by telephone at the time of interpretation on 10/17/2020 at 6:31 pm to provider [facility] RAY , who verbally acknowledged these results. IMPRESSION: CTA neck: 1. The common carotid, internal carotid and vertebral arteries are patent within the neck without stenosis. No evidence of dissection or aneurysm. 2. Right mainstem intubation. CTA head: 1. No intracranial aneurysm or AVM is identified. However, a contrast-enhanced brain MRI and MR angiography should be considered once the hematoma involute to further evaluate for any underlying lesion. 2. No contrast blush in the region of the left cerebral hemisphere acute parenchymal hematoma. 3. No intracranial large vessel occlusion or proximal high-grade arterial stenosis. Electronically Signed   By: Jackey Loge DO   On: 10/17/2020 18:39    Review of Systems  Unable to perform ROS: Acuity of condition   Blood pressure (!) 149/87, pulse  67, temperature (!) 95 F (35 C), temperature source Temporal, resp. rate 18, height  (1.727 m), weight 72.6 kg, SpO2 100 %. Physical Exam Constitutional:      Comments: Patient was seen in the emergency room already having been intubated.  Her left pupil was 5 mm irregular and nonreactive.  The right pupil was 2 mm and nonreactive.  There is no corneal response.  Patient had a flexor response on the left and no movement on the right.  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  With herniation syndrome  Plan: Emergent craniotomy for decompression of large intracerebral hemorrhage.  Jane Martin 10/18/2020, 1:02 AM

## 2020-10-18 NOTE — Progress Notes (Signed)
Spoke with Dr Danielle Dess with Neurosurgery. Verbal orders given to keep drain at level 0 and allow the EVD to drain continuously. RN to continue to monitor.

## 2020-10-19 ENCOUNTER — Inpatient Hospital Stay (HOSPITAL_COMMUNITY): Payer: 59

## 2020-10-19 ENCOUNTER — Inpatient Hospital Stay: Payer: Self-pay

## 2020-10-19 DIAGNOSIS — I6389 Other cerebral infarction: Secondary | ICD-10-CM | POA: Diagnosis not present

## 2020-10-19 DIAGNOSIS — Z9889 Other specified postprocedural states: Secondary | ICD-10-CM | POA: Diagnosis not present

## 2020-10-19 DIAGNOSIS — I611 Nontraumatic intracerebral hemorrhage in hemisphere, cortical: Secondary | ICD-10-CM | POA: Diagnosis not present

## 2020-10-19 DIAGNOSIS — J9620 Acute and chronic respiratory failure, unspecified whether with hypoxia or hypercapnia: Secondary | ICD-10-CM | POA: Diagnosis not present

## 2020-10-19 DIAGNOSIS — G936 Cerebral edema: Secondary | ICD-10-CM | POA: Diagnosis not present

## 2020-10-19 DIAGNOSIS — J9601 Acute respiratory failure with hypoxia: Secondary | ICD-10-CM | POA: Diagnosis not present

## 2020-10-19 LAB — CBC
HCT: 24.9 % — ABNORMAL LOW (ref 36.0–46.0)
Hemoglobin: 8.1 g/dL — ABNORMAL LOW (ref 12.0–15.0)
MCH: 30.8 pg (ref 26.0–34.0)
MCHC: 32.5 g/dL (ref 30.0–36.0)
MCV: 94.7 fL (ref 80.0–100.0)
Platelets: 137 10*3/uL — ABNORMAL LOW (ref 150–400)
RBC: 2.63 MIL/uL — ABNORMAL LOW (ref 3.87–5.11)
RDW: 12.8 % (ref 11.5–15.5)
WBC: 9 10*3/uL (ref 4.0–10.5)
nRBC: 0 % (ref 0.0–0.2)

## 2020-10-19 LAB — PREGNANCY, URINE: Preg Test, Ur: NEGATIVE

## 2020-10-19 LAB — HEPATIC FUNCTION PANEL
ALT: 10 U/L (ref 0–44)
AST: 20 U/L (ref 15–41)
Albumin: 3.4 g/dL — ABNORMAL LOW (ref 3.5–5.0)
Alkaline Phosphatase: 47 U/L (ref 38–126)
Bilirubin, Direct: 0.1 mg/dL (ref 0.0–0.2)
Indirect Bilirubin: 0.4 mg/dL (ref 0.3–0.9)
Total Bilirubin: 0.5 mg/dL (ref 0.3–1.2)
Total Protein: 6.2 g/dL — ABNORMAL LOW (ref 6.5–8.1)

## 2020-10-19 LAB — BASIC METABOLIC PANEL
Anion gap: 5 (ref 5–15)
BUN: <5 mg/dL — ABNORMAL LOW (ref 6–20)
CO2: 23 mmol/L (ref 22–32)
Calcium: 8.7 mg/dL — ABNORMAL LOW (ref 8.9–10.3)
Chloride: 121 mmol/L — ABNORMAL HIGH (ref 98–111)
Creatinine, Ser: 0.65 mg/dL (ref 0.44–1.00)
GFR, Estimated: >60 mL/min (ref >60)
Glucose, Bld: 93 mg/dL (ref 70–99)
Potassium: 3.7 mmol/L (ref 3.5–5.1)
Sodium: 149 mmol/L — ABNORMAL HIGH (ref 135–145)

## 2020-10-19 LAB — PHOSPHORUS: Phosphorus: 2.4 mg/dL — ABNORMAL LOW (ref 2.5–4.6)

## 2020-10-19 LAB — GLUCOSE, CAPILLARY: Glucose-Capillary: 100 mg/dL — ABNORMAL HIGH (ref 70–99)

## 2020-10-19 LAB — SODIUM
Sodium: 145 mmol/L (ref 135–145)
Sodium: 146 mmol/L — ABNORMAL HIGH (ref 135–145)
Sodium: 146 mmol/L — ABNORMAL HIGH (ref 135–145)

## 2020-10-19 LAB — PROTIME-INR
INR: 1.7 — ABNORMAL HIGH (ref 0.8–1.2)
Prothrombin Time: 19.5 seconds — ABNORMAL HIGH (ref 11.4–15.2)

## 2020-10-19 LAB — MAGNESIUM: Magnesium: 2.1 mg/dL (ref 1.7–2.4)

## 2020-10-19 MED ORDER — SODIUM CHLORIDE 0.9% FLUSH
10.0000 mL | INTRAVENOUS | Status: DC | PRN
  Administered 2020-10-20: 10 mL

## 2020-10-19 MED ORDER — SODIUM CHLORIDE 3 % IV SOLN
INTRAVENOUS | Status: DC
  Filled 2020-10-19 (×4): qty 500

## 2020-10-19 MED ORDER — DEXMEDETOMIDINE HCL IN NACL 400 MCG/100ML IV SOLN
0.4000 ug/kg/h | INTRAVENOUS | Status: DC
  Administered 2020-10-19: 0.6 ug/kg/h via INTRAVENOUS
  Administered 2020-10-19 (×2): 1 ug/kg/h via INTRAVENOUS
  Administered 2020-10-20: 1.1 ug/kg/h via INTRAVENOUS
  Administered 2020-10-20 – 2020-10-21 (×4): 1.2 ug/kg/h via INTRAVENOUS
  Administered 2020-10-21: 0.6 ug/kg/h via INTRAVENOUS
  Administered 2020-10-21: 1.2 ug/kg/h via INTRAVENOUS
  Administered 2020-10-22: 1 ug/kg/h via INTRAVENOUS
  Filled 2020-10-19 (×14): qty 100

## 2020-10-19 MED ORDER — PROSOURCE TF PO LIQD
45.0000 mL | Freq: Two times a day (BID) | ORAL | Status: DC
  Administered 2020-10-19: 45 mL

## 2020-10-19 MED ORDER — HEPARIN SODIUM (PORCINE) 5000 UNIT/ML IJ SOLN
5000.0000 [IU] | Freq: Three times a day (TID) | INTRAMUSCULAR | Status: DC
  Administered 2020-10-19 – 2020-10-30 (×31): 5000 [IU] via SUBCUTANEOUS
  Filled 2020-10-19 (×31): qty 1

## 2020-10-19 MED ORDER — VITAL HIGH PROTEIN PO LIQD
1000.0000 mL | ORAL | Status: DC
  Administered 2020-10-19: 1000 mL

## 2020-10-19 MED ORDER — GADOBUTROL 1 MMOL/ML IV SOLN
7.5000 mL | Freq: Once | INTRAVENOUS | Status: AC | PRN
  Administered 2020-10-19: 7.5 mL via INTRAVENOUS

## 2020-10-19 MED ORDER — SODIUM CHLORIDE 0.9% FLUSH
10.0000 mL | Freq: Two times a day (BID) | INTRAVENOUS | Status: DC
  Administered 2020-10-19: 10 mL
  Administered 2020-10-20: 30 mL
  Administered 2020-10-20 – 2020-10-30 (×20): 10 mL

## 2020-10-19 NOTE — Progress Notes (Signed)
eLink Physician-Brief Progress Note Patient Name: Jane Martin DOB: 1995-12-26 MRN: [id number]   Date of Service  10/19/2020  HPI/Events of Note  Patient with sub-optimal sedation leading to extreme agitation and ventilator dyssynchrony.  eICU Interventions  Fentanyl ceiling increased to 400 mcg, patient is resting comfortably at the moment.        Thomasene Lot [name] 10/19/2020, 11:00 PM

## 2020-10-19 NOTE — Progress Notes (Addendum)
STROKE TEAM PROGRESS NOTE   SUBJECTIVE (INTERVAL HISTORY) Na stable at 149 on HTS.  Patient remains intubated, sedated on propofol and fentanyl. She is responsive to voice and moving LUE and bilat LE spontaneously. RUE no response to pain or spontaneous movement.  No visitors at bedside.   OBJECTIVE Vitals:   10/19/20 0503 10/19/20 0600 10/19/20 0700 10/19/20 0804  BP: 100/62 97/60 112/68   Pulse: 95 (!) 101 96   Resp: 19 14 14    Temp:      TempSrc:      SpO2: 99% 98% 100% 98%  Weight:      Height:       CBC:  Recent Labs  Lab 10/17/20 1740 10/17/20 1749 10/18/20 0427 10/18/20 1032 10/19/20 0525  WBC 6.3   < > 9.8  --  9.0  NEUTROABS 1.8  --   --   --   --   HGB 13.8   < > 8.9* 8.8* 8.1*  HCT 41.7   < > 25.4* 26.0* 24.9*  MCV 93.3   < > 90.4  --  94.7  PLT 250   < > 157  --  137*   < > = values in this interval not displayed.   Basic Metabolic Panel:  Recent Labs  Lab 10/18/20 0427 10/18/20 1032 10/18/20 1040 10/19/20 0236 10/19/20 0525  NA 144 143   < > 145 149*  K 3.0* 3.2*  --   --  3.7  CL 112*  --   --   --  121*  CO2 21*  --   --   --  23  GLUCOSE 107*  --   --   --  93  BUN 5*  --   --   --  <5*  CREATININE 0.58  --   --   --  0.65  CALCIUM 8.6*  --   --   --  8.7*   < > = values in this interval not displayed.    Lipid Panel:  Recent Labs  Lab 10/18/20 0427 10/18/20 0428  CHOL 108  --   TRIG 29 29  HDL 35*  --   CHOLHDL 3.1  --   VLDL 6  --   LDLCALC 67  --    HgbA1c: No results found for: HGBA1C Urine Drug Screen:     Component Value Date/Time   LABOPIA NONE DETECTED 10/18/2020 0030   COCAINSCRNUR NONE DETECTED 10/18/2020 0030   LABBENZ NONE DETECTED 10/18/2020 0030   AMPHETMU NONE DETECTED 10/18/2020 0030   THCU POSITIVE (A) 10/18/2020 0030   LABBARB NONE DETECTED 10/18/2020 0030    Alcohol Level     Component Value Date/Time   ETH <10 10/17/2020 1740   IMAGING CT head  left frontoparietal intraparenchymal hematoma, with  IVH. 1.0 cm of left-to-right midline shift with effacement of the basilar cisterns  CTA Head and neck   Unremarkable, no AVM or aneurysm  Brain MRI No mass or definite vascular lesion underlying the evacuated left parietal hematoma. There is accentuated intravascular enhancement along the superficial cavity which may be reactive; catheter angiography would be contributory. Resolved midline shift. No hydrocephalus  PHYSICAL EXAM GENERAL: Sedated and intubated on mechanical ventilator. Cranial dressing clean, dry and intact.  HEENT: - Normocephalic and atraumatic, dry mm, EVD present, NG tube, and ETT LUNGS - Clear to auscultation bilaterally with no wheezes CV - S1S2 RRR, no m/r/g, equal pulses bilaterally. ABDOMEN - Soft, nontender, nondistended with normoactive BS  NEURO:  Mental Status: sedated and intubated  Language: unable to assess  Cranial Nerves  Pupils Right pupil 2 and sluggish, left pupil 3 and sluggish.  EOMI visual fields no blink to threat on right, bilateral corneals  facial asymmetry unable to assess, Motor: Responds to noxious stimuli, moves left arm purposeful Tone: is normal and bulk is normal Sensation- right abnormal  Coordination: unable to assess Gait- deferred  ASSESSMENT/PLAN [name] is a 25 y.o. female with no history, presented to ED by EMS after collapsing at a gas station. It was told that she yelled out for help and for someone to call 911, then collapsed to the ground. When EMS arrived, she was unresponsive which worsened to comatose. Intubated in ED.    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 Etiology unknown.  S/p Left parietal craniotomy with evacuation of intracerebral hemorrhage w/Placement of intraventricular catheter on 4/15 by Dr. Danielle Dess  Repeat CT 4/16 - largely resolved ICH and midline shift. Residue IVH   MRI 4/17 resolved  midline shift, no hydrocephalus. No mass or definite vascular lesion seen.  MRV negative   2D Echo pending  On Keppra   Heparin subq for VTE prophylaxis  No antithrombotic prior to admission, now on No antithrombotic.   Ongoing aggressive stroke risk factor management  Therapy recommendations:  Pending  Disposition:  TBD  ? Abnormal brain tissue  Per Dr. Danielle Dess op note "a small cluster of tissue was found that was cauterized and removed in a singular piece.  After this it seemed that the hemorrhage subsided considerably. Several biopsies of the surrounding brain were obtained." Not sure if sent for pathology  MRI - there is accentuated intravascular enhancement along the superficial cavity which may be reactive; catheter angiography may be considered when patient is more stable.  Will do cerebral angiogram once stable   Respiratory failure  Intubated on vent  CCM on board  Change sedation to precedex  Wean off vent as able   Hypertension . BP goal < 160 . SBP low normal to high 90s on cuff pressure  . Cleviprex PRN for volume control . Off A line  Birth control  Mom stated that pt has a hormone depo for birth control  Not sure the name  Concerning both estrogen and progesterone containing  Pt not smoker but vaping - mom not sure if nicotine containing  MRV showed no evidence for cerebral sinus venous thrombosis.  COVID positive  Asymptomatic   Mom does not know any potential exposure  Intubated now  Dysphagia   May consider TF tomorrow (Monday) or later today pending weaning status  Acute blood loss anemia  Hgb 9.6->8.8->8.1  Monitor   Other Stroke Risk Factors  Vaping - ? Nicotine or non-nicotine (mom not sure)   Family hx stroke (Mom had a stroke)   Other Active Problems    Hospital day # 2  This plan of care was directed by Dr. Roda Shutters.  Shon Hale, NP-C   ATTENDING NOTE: I reviewed above note and agree with the  assessment and plan. Pt was seen and examined.   RN at bedside.  Patient still intubated, on ventilation with sedation.  No significant change of neuro status.  Still not follow commands, but able to open eyes with pain stimulation.  Did not move extremities on pain stimulation, however spontaneously moving left arm and leg.  Still has right hemiplegia.  Pupil  2 mm bilaterally, sluggish to light.  Corneal reflexes bilaterally present, right more than left.  Gag and cough present.  Orbital swelling left more than right.  MRI this morning shows stable cerebral edema, minimal midline shift.  MRV showed no sinus venous thrombosis.  However, there is concerning for vascular abnormality, recommend cerebral angiogram once stable.  Sedation change to Precedex in order to wean off ventilation, will start tube feeding tonight.  Sodium stable 149-> 146, with starting tube feeding, decrease 3% saline to 50 cc/h.  Neurosurgery Dr. Franky Macho on board, may consider DC'd drainage.  Start heparin subcu for DVT prophylaxis.  For detailed assessment and plan, please refer to above as I have made changes wherever appropriate.   Marvel Plan, MD PhD Stroke Neurology 10/19/2020 6:22 PM  This patient is critically ill due to large left frontal ICH, IVH, status post hematoma evacuation, respiratory failure needing intubation and at significant risk of neurological worsening, death form hematoma expansion, hydrocephalus, seizure, sepsis. This patient's care requires constant monitoring of vital signs, hemodynamics, respiratory and cardiac monitoring, review of multiple databases, neurological assessment, discussion with family, other specialists and medical decision making of high complexity. I spent 40 minutes of neurocritical care time in the care of this patient.       To contact Stroke Continuity provider, please refer to [email or web address]. After hours, contact General Neurology

## 2020-10-19 NOTE — Progress Notes (Signed)
Patient with 3% saline infusing at [facility], found to be leaking into bed. 3% stopped, no signs of extravasation, IV removed. Patient with bilateral hand PIV, IV watch alarming red at these sites and so 3% turned off at this time. Multiple attempts by this RN and second RN to get additional PIV but unsuccessful. MD aware that 3% off at this time and multiple PIV lost, order for PICC placed.  Aris Lot, RN

## 2020-10-19 NOTE — Progress Notes (Signed)
OT Cancellation Note  Patient Details Name: Jane Martin MRN: [id number] DOB: [birth year]   Cancelled Treatment:    Reason Eval/Treat Not Completed: Patient not medically ready. Will check back.  Eber Jones., OTR/L Acute Rehabilitation Services Pager [phone] Office [phone]   Jeani Hawking M 10/19/2020, 12:08 PM

## 2020-10-19 NOTE — Progress Notes (Signed)
Patient ID: Jane Martin, female   DOB: 1995/12/20, 25 y.o.   MRN: [id number] BP (!) 102/59   Pulse 85   Temp 98.8 F (37.1 C) (Axillary)   Resp 14   Ht 5\' 8"  (1.727 m)   Wt 72.6 kg   SpO2 100%   BMI 24.33 kg/m  Sedated, intubated Perrl, +cough, +gag Purposeful in left upper extremity Plegic on right side Did not follow commands Wound dressing is dry improving

## 2020-10-19 NOTE — Progress Notes (Signed)
NAME:  Jane Martin, MRN:  [id number], DOB:  1996/03/18, LOS: 2 ADMISSION DATE:  10/17/2020, CONSULTATION DATE:  10/17/20 REFERRING MD:  Amada Jupiter - neuro , CHIEF COMPLAINT:    History of Present Illness:  25 yo F  medical history of sexually transmitted infections who was brought to ED 10/17/20 after saying "help me" and collapsing at a gas station. She was unresponsive by time of EMS arrival and per chart review at time of EMS pt had 3mm fixed pupils, disconjugate gaze, minimal response to pain and agonal respiraitons. In ED, patient intubated for airway protection given low GCS. Code stroke initiated and pt found to have a large acute L frontoparietal intraparenchymal hematoma, decompressed into L lateral ventricle and extends into third ventricle, with 1cm L to R midline shift. NSGY and Neuro consulted. The patient is being taken emergently to the OR and will return to the ICU intubated following the case.   Labs in ED significant for K 3, CO2 19, Glu 156, LA 5.3, INR 1.3  PCCM has been consulted in this setting.   Pertinent  Medical History  Chlamydia Gonhorrhea Candida vaginitis BV  Significant Hospital Events: Including procedures, antibiotic start and stop dates in addition to other pertinent events   . 4/15 BIB EMS after collapsing at gas station and losing consciousness. Large intraparenchymal hematoma.  Going to OR emergently. CCM consulted for expectant vent management  . 4/16 S/p craniotomy and drain. Repeat CT head with largely resolved mass effect and resolved midline shift, s/p hemorrhage evacuation with drain in place . 4/17 MRI with resolved midline shift. No mass or vascular lesion underlying hematoma  Interim History / Subjective:  Overnight, MRI demonstrated resolved midline shift. On minimal vent settings. Patient spontaneously moving left upper extremity. 3% discontinued by Neuro this am  Objective   Blood pressure 112/68, pulse 96, temperature 98.8 F (37.1  C), temperature source Axillary, resp. rate 14, height 5\' 8"  (1.727 m), weight 72.6 kg, SpO2 98 %.    Vent Mode: PRVC FiO2 (%):  [30 %] 30 % Set Rate:  [14 bmp] 14 bmp Vt Set:  [510 mL] 510 mL PEEP:  [5 cmH20] 5 cmH20 Plateau Pressure:  [12 cmH20-17 cmH20] 12 cmH20   Intake/Output Summary (Last 24 hours) at 10/19/2020 1025 Last data filed at 10/19/2020 0957 Gross per 24 hour  Intake 2427.65 ml  Output 3045 ml  Net -617.35 ml   Filed Weights   10/17/20 1800 10/17/20 1825  Weight: 72.6 kg 72.6 kg   Physical Exam: General: Young -appearing, on mechanical ventilation HENT: [place], AT, ETT in place Eyes: Pinpoint pupils equal and reactive, EOMI, no scleral icterus Respiratory: Clear to auscultation bilaterally.  No crackles, wheezing or rales Cardiovascular: RRR, -M/R/G, no JVD GI: BS+, soft, nontender Extremities:-Edema,-tenderness Neuro: Sedated, spontaneously moves LUE, does not follow commands  Labs/imaging that I havepersonally reviewed  (right click and "Reselect all SmartList Selections" daily)  4/15 CT H> 5.6x4.4cm acute L frontoparietal intraparenchymal hematoma, decompressed into L ventricle which contains significant amount of blood, which extends into third ventricle as well. 1cm L to R midline shift. Effacement of basilar cisterns including suprasellar and ambient cisterns   CT head post-op with largely resolved mass effect and resolved midline shift, s/p hemorrhage evacuation with drain in place  MRI brain with resolved midline shift  Na 149 on hypertonic saline Cl 121 Lactic acidosis cleared  Imaging, labs and test noted above have been reviewed independently by me.  Resolved Hospital Problem  list     Assessment & Plan:   IPH s/p emergent craniotomy 4/15 -Large acute L frontoparietal intraparenchymal hematoma, decompressed into L ventricle, extending into third ventricle with associated L to R midline shift, with effacement of basilar cisterns  -Post-op CT with  evacuation of hemorrhage and largely resolved mass effect and midline shift -S/p mannitol P -post op per NSGY  -imaging per NSGY / neuro -SBP goal <160 -hold chemical vte ppx   Acute respiratory failure requiring intubation COVID-19 positive -- no known sx of this  P -airborne/contact  -will defer covid related therapies as no hx of pt having symptoms, and cxr without -Wean sedation for RASS goal 0 and -1. Transition to Precedex  -If pass SBT, plan to extubate  Hypokalemia - resolved P -Replete -Trend  Coagulopathy, very mild LFTs normal P -will check AM coags and LFTs   Best practice (right click and "Reselect all SmartList Selections" daily)  Diet:  Tube Feed  Pain/Anxiety/Delirium protocol (if indicated): Yes (RASS goal -1) VAP protocol (if indicated): Yes DVT prophylaxis: SCD GI prophylaxis: PPI Glucose control:  SSI No Central venous access:  N/A Arterial line:  Yes, and it is still needed Foley:  Yes, and it is still needed Mobility:  bed rest  PT consulted: N/A Last date of multidisciplinary goals of care discussion [per primary] Code Status:  full code Disposition: ICU   Critical care time: 50 min    The patient is critically ill with multiple organ systems failure and requires high complexity decision making for assessment and support, frequent evaluation and titration of therapies, application of advanced monitoring technologies and extensive interpretation of multiple databases.  Independent Critical Care Time: 50 Minutes.   Mechele Collin, M.D. [facility] Pulmonary/Critical Care Medicine 10/19/2020 10:25 AM   Please see Amion for pager number to reach on-call Pulmonary and Critical Care Team.

## 2020-10-19 NOTE — Progress Notes (Signed)
PT Cancellation Note  Patient Details Name: Jane Martin MRN: [id number] DOB: Feb 22, 1996   Cancelled Treatment:    Reason Eval/Treat Not Completed: Patient not medically ready;Active bedrest order. Will follow-up another day as appropriate.   Raymond Gurney, PT, DPT Acute Rehabilitation Services  Pager: [phone] Office: [phone]    [name] 10/19/2020, 8:26 AM

## 2020-10-19 NOTE — Progress Notes (Signed)
Peripherally Inserted Central Catheter Placement  The IV Nurse has discussed with the patient and/or persons authorized to consent for the patient, the purpose of this procedure and the potential benefits and risks involved with this procedure.  The benefits include less needle sticks, lab draws from the catheter, and the patient may be discharged home with the catheter. Risks include, but not limited to, infection, bleeding, blood clot (thrombus formation), and puncture of an artery; nerve damage and irregular heartbeat and possibility to perform a PICC exchange if needed/ordered by physician.  Alternatives to this procedure were also discussed.  Bard Power PICC patient education guide, fact sheet on infection prevention and patient information card has been provided to patient /or left at bedside. Mother at bedside.   PICC Placement Documentation  PICC Triple Lumen 10/19/20 PICC Left Brachial 43 cm 0 cm (Active)  Indication for Insertion or Continuance of Line Vasoactive infusions;Administration of hyperosmolar/irritating solutions (i.e. TPN, Vancomycin, etc.) 10/19/20 1624  Exposed Catheter (cm) 0 cm 10/19/20 1624  Site Assessment Clean;Dry;Intact 10/19/20 1624  Lumen #1 Status Flushed;Saline locked;Blood return noted 10/19/20 1624  Lumen #2 Status Flushed;Saline locked;Blood return noted 10/19/20 1624  Lumen #3 Status Flushed;Saline locked;Blood return noted 10/19/20 1624  Dressing Type Transparent 10/19/20 1624  Dressing Status Clean;Dry;Intact 10/19/20 1624  Antimicrobial disc in place? Yes 10/19/20 1624  Safety Lock Not Applicable 10/19/20 1624  Line Care Connections checked and tightened 10/19/20 1624  Line Adjustment (NICU/IV Team Only) No 10/19/20 1624  Dressing Intervention New dressing 10/19/20 1624  Dressing Change Due 10/26/20 10/19/20 1624       Elliot Dally 10/19/2020, 4:28 PM

## 2020-10-19 NOTE — Plan of Care (Signed)
Brief Cardiology Plan of Care note: IT issues with Echo read Briefly Normal LV and RV function No valvular abnormalities (difficult aortic valve imaging but grossly normal). No pericardial effusion.  Will work the sonographer team in AM of 10/20/20 to resolved this issue.  Riley Lam, MD Cardiologist [facility]  [address] [place], [address] [place], [place] [address] [phone]  7:11 PM

## 2020-10-19 NOTE — Progress Notes (Signed)
  Echocardiogram 2D Echocardiogram has been performed.  Jane Martin F 10/19/2020, 6:01 PM

## 2020-10-20 ENCOUNTER — Encounter (HOSPITAL_COMMUNITY): Payer: Self-pay | Admitting: Neurological Surgery

## 2020-10-20 ENCOUNTER — Inpatient Hospital Stay (HOSPITAL_COMMUNITY): Payer: 59

## 2020-10-20 DIAGNOSIS — I611 Nontraumatic intracerebral hemorrhage in hemisphere, cortical: Secondary | ICD-10-CM | POA: Diagnosis not present

## 2020-10-20 DIAGNOSIS — I639 Cerebral infarction, unspecified: Secondary | ICD-10-CM

## 2020-10-20 DIAGNOSIS — J9620 Acute and chronic respiratory failure, unspecified whether with hypoxia or hypercapnia: Secondary | ICD-10-CM | POA: Diagnosis not present

## 2020-10-20 DIAGNOSIS — G936 Cerebral edema: Secondary | ICD-10-CM | POA: Diagnosis not present

## 2020-10-20 DIAGNOSIS — J9601 Acute respiratory failure with hypoxia: Secondary | ICD-10-CM | POA: Diagnosis not present

## 2020-10-20 DIAGNOSIS — I615 Nontraumatic intracerebral hemorrhage, intraventricular: Secondary | ICD-10-CM | POA: Diagnosis not present

## 2020-10-20 LAB — BASIC METABOLIC PANEL
Anion gap: 5 (ref 5–15)
BUN: <5 mg/dL — ABNORMAL LOW (ref 6–20)
CO2: 19 mmol/L — ABNORMAL LOW (ref 22–32)
Calcium: 8.2 mg/dL — ABNORMAL LOW (ref 8.9–10.3)
Chloride: 124 mmol/L — ABNORMAL HIGH (ref 98–111)
Creatinine, Ser: 0.52 mg/dL (ref 0.44–1.00)
GFR, Estimated: >60 mL/min (ref >60)
Glucose, Bld: 87 mg/dL (ref 70–99)
Potassium: 3.5 mmol/L (ref 3.5–5.1)
Sodium: 148 mmol/L — ABNORMAL HIGH (ref 135–145)

## 2020-10-20 LAB — SODIUM
Sodium: 122 mmol/L — ABNORMAL LOW (ref 135–145)
Sodium: 148 mmol/L — ABNORMAL HIGH (ref 135–145)
Sodium: 151 mmol/L — ABNORMAL HIGH (ref 135–145)
Sodium: 153 mmol/L — ABNORMAL HIGH (ref 135–145)

## 2020-10-20 LAB — CBC
HCT: 21.5 % — ABNORMAL LOW (ref 36.0–46.0)
Hemoglobin: 7 g/dL — ABNORMAL LOW (ref 12.0–15.0)
MCH: 31.8 pg (ref 26.0–34.0)
MCHC: 32.6 g/dL (ref 30.0–36.0)
MCV: 97.7 fL (ref 80.0–100.0)
Platelets: 124 10*3/uL — ABNORMAL LOW (ref 150–400)
RBC: 2.2 MIL/uL — ABNORMAL LOW (ref 3.87–5.11)
RDW: 13 % (ref 11.5–15.5)
WBC: 5.6 10*3/uL (ref 4.0–10.5)
nRBC: 0 % (ref 0.0–0.2)

## 2020-10-20 LAB — PHOSPHORUS
Phosphorus: 2 mg/dL — ABNORMAL LOW (ref 2.5–4.6)
Phosphorus: 3.8 mg/dL (ref 2.5–4.6)

## 2020-10-20 LAB — ECHOCARDIOGRAM COMPLETE
Height: 68 in
Weight: 2560 oz

## 2020-10-20 LAB — GLUCOSE, CAPILLARY
Glucose-Capillary: 99 mg/dL (ref 70–99)
Glucose-Capillary: 94 mg/dL (ref 70–99)
Glucose-Capillary: 109 mg/dL — ABNORMAL HIGH (ref 70–99)
Glucose-Capillary: 106 mg/dL — ABNORMAL HIGH (ref 70–99)
Glucose-Capillary: 133 mg/dL — ABNORMAL HIGH (ref 70–99)
Glucose-Capillary: 99 mg/dL (ref 70–99)

## 2020-10-20 LAB — HEMOGLOBIN AND HEMATOCRIT, BLOOD
HCT: 23.2 % — ABNORMAL LOW (ref 36.0–46.0)
Hemoglobin: 7.2 g/dL — ABNORMAL LOW (ref 12.0–15.0)

## 2020-10-20 LAB — HEMOGLOBIN A1C
Hgb A1c MFr Bld: 5.6 % (ref 4.8–5.6)
Mean Plasma Glucose: 114 mg/dL

## 2020-10-20 LAB — MAGNESIUM
Magnesium: 2 mg/dL (ref 1.7–2.4)
Magnesium: 2 mg/dL (ref 1.7–2.4)

## 2020-10-20 MED ORDER — PROSOURCE TF PO LIQD
45.0000 mL | Freq: Three times a day (TID) | ORAL | Status: DC
  Administered 2020-10-20 – 2020-10-27 (×16): 45 mL
  Filled 2020-10-20 (×17): qty 45

## 2020-10-20 MED ORDER — OSMOLITE 1.5 CAL PO LIQD
1000.0000 mL | ORAL | Status: DC
  Administered 2020-10-20 – 2020-10-26 (×5): 1000 mL
  Filled 2020-10-20 (×3): qty 1000

## 2020-10-20 MED ORDER — FENTANYL CITRATE (PF) 100 MCG/2ML IJ SOLN
50.0000 ug | INTRAMUSCULAR | Status: DC | PRN
  Administered 2020-10-20: 50 ug via INTRAVENOUS
  Administered 2020-10-20: 100 ug via INTRAVENOUS
  Administered 2020-10-20: 50 ug via INTRAVENOUS
  Administered 2020-10-21 – 2020-10-22 (×3): 100 ug via INTRAVENOUS
  Filled 2020-10-20 (×6): qty 2

## 2020-10-20 MED ORDER — ORAL CARE MOUTH RINSE
15.0000 mL | Freq: Two times a day (BID) | OROMUCOSAL | Status: DC
  Administered 2020-10-20 – 2020-10-29 (×17): 15 mL via OROMUCOSAL

## 2020-10-20 MED ORDER — POTASSIUM PHOSPHATES 15 MMOLE/5ML IV SOLN
30.0000 mmol | Freq: Once | INTRAVENOUS | Status: AC
  Administered 2020-10-20: 30 mmol via INTRAVENOUS
  Filled 2020-10-20: qty 10

## 2020-10-20 MED ORDER — CHLORHEXIDINE GLUCONATE 0.12 % MT SOLN
15.0000 mL | Freq: Two times a day (BID) | OROMUCOSAL | Status: DC
  Administered 2020-10-20 – 2020-10-30 (×21): 15 mL via OROMUCOSAL
  Filled 2020-10-20 (×15): qty 15

## 2020-10-20 MED ORDER — PANTOPRAZOLE SODIUM 40 MG PO PACK
40.0000 mg | PACK | Freq: Every day | ORAL | Status: DC
  Administered 2020-10-20 – 2020-10-22 (×2): 40 mg
  Filled 2020-10-20 (×2): qty 20

## 2020-10-20 MED ORDER — POTASSIUM PHOSPHATES 15 MMOLE/5ML IV SOLN: 10.0000 mmol | Freq: Once | INTRAVENOUS | Status: DC

## 2020-10-20 MED FILL — Thrombin For Soln Kit 20000 Unit: CUTANEOUS | Qty: 1 | Status: AC

## 2020-10-20 MED FILL — Thrombin For Soln 5000 Unit: CUTANEOUS | Qty: 5000 | Status: AC

## 2020-10-20 NOTE — Addendum Note (Signed)
Addendum  created 10/20/20 0557 by Adair Laundry, CRNA   Order list changed, Pharmacy for encounter modified

## 2020-10-20 NOTE — Progress Notes (Addendum)
STROKE TEAM PROGRESS NOTE   SUBJECTIVE (INTERVAL HISTORY) Na stable at 149  Patient remains intubated, sedated. Per nursing she became fully awake, very agitated, thrashing in bed requiring sedation bolus this morning.  She is responsive to voice with brief eye opening and moving all 4 extremities spontaneously but still with apparent RUE weakness. CCM considering extubation today. No visitors at bedside.   OBJECTIVE Vitals:   10/20/20 0400 10/20/20 0500 10/20/20 0600 10/20/20 0700  BP: 126/81 126/84 125/81 123/79  Pulse: (!) 47 (!) 45 (!) 44 (!) 44  Resp: 14 14 14 14   Temp:      TempSrc:      SpO2: 100% 100% 100% 100%  Weight:      Height:       CBC:  Recent Labs  Lab 10/17/20 1740 10/17/20 1749 10/19/20 0525 10/20/20 0500  WBC 6.3   < > 9.0 5.6  NEUTROABS 1.8  --   --   --   HGB 13.8   < > 8.1* 7.0*  HCT 41.7   < > 24.9* 21.5*  MCV 93.3   < > 94.7 97.7  PLT 250   < > 137* 124*   < > = values in this interval not displayed.   Basic Metabolic Panel:  Recent Labs  Lab 10/19/20 0525 10/19/20 1021 10/19/20 1752 10/19/20 2355 10/20/20 0500  NA 149*   < >  --  153* 148*  K 3.7  --   --   --  3.5  CL 121*  --   --   --  124*  CO2 23  --   --   --  19*  GLUCOSE 93  --   --   --  87  BUN <5*  --   --   --  <5*  CREATININE 0.65  --   --   --  0.52  CALCIUM 8.7*  --   --   --  8.2*  MG  --   --  2.1  --  2.0  PHOS  --   --  2.4*  --  2.0*   < > = values in this interval not displayed.    Lipid Panel:  Recent Labs  Lab 10/18/20 0427 10/18/20 0428  CHOL 108  --   TRIG 29 29  HDL 35*  --   CHOLHDL 3.1  --   VLDL 6  --   LDLCALC 67  --    HgbA1c:  Lab Results  Component Value Date   HGBA1C 5.6 10/18/2020   Urine Drug Screen:     Component Value Date/Time   LABOPIA NONE DETECTED 10/18/2020 0030   COCAINSCRNUR NONE DETECTED 10/18/2020 0030   LABBENZ NONE DETECTED 10/18/2020 0030   AMPHETMU NONE DETECTED 10/18/2020 0030   THCU POSITIVE (A) 10/18/2020 0030    LABBARB NONE DETECTED 10/18/2020 0030    Alcohol Level     Component Value Date/Time   ETH <10 10/17/2020 1740   IMAGING CT head  left frontoparietal intraparenchymal hematoma, with IVH. 1.0 cm of left-to-right midline shift with effacement of the basilar cisterns  CTA Head and neck   Unremarkable, no AVM or aneurysm  Brain MRI No mass or definite vascular lesion underlying the evacuated left parietal hematoma. There is accentuated intravascular enhancement along the superficial cavity which may be reactive; catheter angiography would be contributory. Resolved midline shift. No hydrocephalus  2D Echo 1. Left ventricular ejection fraction, by estimation, is 55 to 60%. The  left ventricle has normal function. The left ventricle has no regional  wall motion abnormalities. Left ventricular diastolic function could not  be evaluated.  2. Right ventricular systolic function is normal. The right ventricular  size is normal.  3. The mitral valve is normal in structure. No evidence of mitral valve  regurgitation.  4. The aortic valve is normal in structure. Aortic valve regurgitation is not visualized.   PHYSICAL EXAM GENERAL: Sedated, now extubated/ Cranial dressing intact with serosanguinous dge noted in the area of the drain.  HEENT: - Normocephalic and atraumatic, dry mm, EVD present, NG tube, and ETT LUNGS - Clear to auscultation bilaterally with no wheezes CV - S1S2 RRR, no m/r/g, equal pulses bilaterally. ABDOMEN - Soft, nontender, nondistended with normoactive BS  NEURO:  Mental Status: sedated, briefly opens eyes to name, agitated with stimulation Language: No verbal response at present Cranial Nerves  Pupils Right pupil 2 and sluggish, left pupil 3 and sluggish.  EOMI visual fields no blink to threat on right, bilateral corneals  facial asymmetry unable to assess, Motor: Responds to noxious stimuli, moves left arm purposeful Tone: is normal and bulk is  normal Sensation- right abnormal  Coordination: unable to assess Gait- deferred  ASSESSMENT/PLAN [name] is a 25 y.o. female with no history, presented to ED by EMS after collapsing at a gas station. It was told that she yelled out for help and for someone to call 911, then collapsed to the ground. When EMS arrived, she was unresponsive which worsened to comatose. Intubated in ED.    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 Etiology unknown.  S/p Left parietal craniotomy with evacuation of intracerebral hemorrhage w/Placement of intraventricular catheter on 4/15 by Jane Martin  Repeat CT 4/16 - largely resolved ICH and midline shift. Residue IVH   MRI 4/17 resolved midline shift, no hydrocephalus. No mass or definite vascular lesion seen.  MRV negative   Repeat HCT tomorrow is pending  2D echo without acute findings   On Keppra   VTE prophylaxis on hold per CCM  No antithrombotic prior to admission, now on No antithrombotic.   On HTS at 50cc per hour, NA 149->148, will address tapering after repeat imaging tomorrow  Ongoing aggressive stroke risk factor management  Therapy recommendations:  Pending  Disposition:  TBD  Cerebral edema  CT and MRI post hematoma evaluation showed resolved midline shift  CT repeat in a.m. pending  On 3% saline @ 50  Na 146-153-148  Na goal 145-150  Na Q6h  ? Abnormal brain tissue  Per Jane Martin op note "a small cluster of tissue was found that was cauterized and removed in a singular piece.  After this it seemed that the hemorrhage subsided considerably. Several biopsies of the surrounding brain were obtained." Not sure if sent for pathology  MRI - there is accentuated intravascular enhancement along the superficial cavity which may be reactive; catheter angiography may be considered when patient is more stable.  Will do  cerebral angiogram once stable   Respiratory failure  Intubated on vent  CCM on board  Change sedation to precedex  Extubated 10/20/20  Hypertension . BP goal < 160 . SBP low normal to high 90s on cuff pressure  . Cleviprex PRN for volume control . Off A line . Jane Martin considering secondary HTN work up when more stable   Birth control  Mom stated that pt has a hormone  depo for birth control  Not sure the name  Concerning both estrogen and progesterone containing  Pt not smoker but vaping - mom not sure if nicotine containing  MRV showed no evidence for cerebral sinus venous thrombosis.  COVID positive  Asymptomatic  Mom does not know any potential exposure  Acute blood loss anemia  Hgb 9.6->8.8->8.1->7.0  Monitor  CCM on board  Dysphagia, Feeding/Nutrition  cortrak tube placed  TF Osmolite per protocol  Dietitian on board  Urinary Retention  Foley placed 4/18  Other Stroke Risk Factors  Vaping - ? Nicotine or non-nicotine (mom not sure)   Family hx stroke (Mom had a stroke)   Other Active Problems    Hospital day # 3  This plan of care was directed by Dr. Roda Shutters.  Shon Hale, NP-C  ATTENDING NOTE: I reviewed above note and agree with the assessment and plan. Pt was seen and examined.   No family at bedside.  Patient was extubated this morning, on Precedex.  Initially low heart rate at 40s but now improved after extubation.  BP stable on the lower end.  Hemoglobin down to 7.0, CCM on board, continue monitoring.  Will check H&H this afternoon.   On exam, patient eyes closed, not follow commands, not open eyes on voice or pain.  With forced eye opening, right eye midline, left eye mild abduction position.  Corneal reflexes weak bilaterally, gag and cough present.  Bilateral orbital swelling, left more than right.  Difficulty checking facial symmetry due to facial swelling too.  Right upper and lower extremity flaccid, left upper and  lower extremities in restraints.  Mild withdrawal on pain of left upper extremity, no movement of pain on the left lower extremity.  DTR diminished, no Babinski. Sensation, coordination and gait not tested.  Patient still on 3% saline at 50, will repeat CT in a.m. and if no significant midline shift, will taper off 3% saline.  Patient left brain catheter no drainage, will discuss with Jane Martin to see better that can be removed.  Core track placed, on tube feeding.   For detailed assessment and plan, please refer to above as I have made changes wherever appropriate.   This patient is critically ill due to large left ICH status post hematoma evacuation, IVH, cerebral edema, respiratory failure and anemia and at significant risk of neurological worsening, death form brain herniation, shock from anemia, hydrocephalus, seizure. This patient's care requires constant monitoring of vital signs, hemodynamics, respiratory and cardiac monitoring, review of multiple databases, neurological assessment, discussion with family, other specialists and medical decision making of high complexity. I spent 35 minutes of neurocritical care time in the care of this patient.  I have discussed with Jane Martin CCM.  Marvel Plan, MD PhD Stroke Neurology 10/20/2020 2:55 PM      To contact Stroke Continuity provider, please refer to [email or web address]. After hours, contact General Neurology

## 2020-10-20 NOTE — Progress Notes (Addendum)
NAME:  Jane Martin, MRN:  [id number], DOB:  02/18/1996, LOS: 3 ADMISSION DATE:  10/17/2020, CONSULTATION DATE:  10/17/20 REFERRING MD:  Jane Martin - neuro , CHIEF COMPLAINT:    History of Present Illness:  25 yo F was brought to ED 10/17/20 after saying "help me" and collapsing at a gas station. She was unresponsive by time of EMS arrival and per chart review at time of EMS pt had 3mm fixed pupils, disconjugate gaze, minimal response to pain and agonal respiraitons. In ED, patient intubated for airway protection given low GCS. Code stroke initiated and pt found to have a large acute L frontoparietal intraparenchymal hematoma, decompressed into L lateral ventricle and extends into third ventricle, with 1cm L to R midline shift. NSGY and Neuro consulted. The patient taken emergently to the OR and brought to 4N ICU following the case.   Pertinent  Medical History  Hypertension   Significant Hospital Events: Including procedures, antibiotic start and stop dates in addition to other pertinent events   . 4/15 BIB EMS after collapsing at gas station and losing consciousness. Large intraparenchymal hematoma.  Going to OR emergently. CCM consulted for expectant vent management  . 4/16 S/p craniotomy and drain. Repeat CT head with largely resolved mass effect and resolved midline shift, s/p hemorrhage evacuation with drain in place . 4/17 MRI with resolved midline shift. No mass or vascular lesion underlying hematoma . 4/18 extubated    Interim History / Subjective:  Patient remained agitated and restless in the bed, trying to get out of bed Placed on a spontaneous breathing trial, due to agitation she was little tachypneic, extubated Remains on 3% saline per neurology/neurosurgery.    Objective   Blood pressure 111/75, pulse 64, temperature 97.8 F (36.6 C), temperature source Axillary, resp. rate 12, height 5\' 8"  (1.727 m), weight 72.6 kg, SpO2 100 %.       Intake/Output Summary (Last 24  hours) at 10/20/2020 0931 Last data filed at 10/20/2020 0834 Gross per 24 hour  Intake 2245.12 ml  Output 1450 ml  Net 795.12 ml   Filed Weights   10/17/20 1800 10/17/20 1825  Weight: 72.6 kg 72.6 kg   Physical Exam: Physical Exam Vitals reviewed.  Constitutional:      Comments: Young female lying in bed  Eyes:     Comments: Pupils 37mm and reactive; without scleral icterus. Dependent edema of L side of face, involving eyelid  Cardiovascular:     Rate and Rhythm: Regular rhythm. Bradycardia present.     Pulses: Normal pulses.     Heart sounds: Normal heart sounds.  Pulmonary:     Comments: Breath sounds symmetrical, no wheezes, crackles, or rhonchi Abdominal:     General: Bowel sounds are normal. There is no distension.  Musculoskeletal:        General: No swelling, tenderness or deformity.  Neurological:     Comments: Sedated. Opens R eye to verbal stimuli, moves LUE spontaneously, does not follow commands      Labs/imaging that I havepersonally reviewed  (right click and "Reselect all SmartList Selections" daily)  4/15 CT H> 5.6x4.4cm acute L frontoparietal intraparenchymal hematoma, decompressed into L ventricle which contains significant amount of blood, which extends into third ventricle as well. 1cm L to R midline shift. Effacement of basilar cisterns including suprasellar and ambient cisterns   4/16 CT head post-op with largely resolved mass effect and resolved midline shift, s/p hemorrhage evacuation with drain in place  4/17 MRI brain with resolved  midline shift  Currently on 3% saline; Na 148, Cl 124  Imaging, labs and test noted above have been reviewed independently by me.  Resolved Hospital Problem list     Assessment & Plan:   IPH s/p emergent craniotomy 4/15 Large acute L frontoparietal intraparenchymal hematoma, decompressed into L ventricle, extending into third ventricle with associated L to R midline shift, with effacement of basilar cisterns.  Post-op CT with evacuation of hemorrhage and largely resolved mass effect and midline shift. S/p mannitol. Suspect hypertensive bleed. Given long history of htn incommensurate with her age, will consider secondary hypertension workup once more stable.   -post op per NSGY  -imaging per NSGY / neuro -SBP goal <140 -hold chemical vte ppx  -secondary htn workup once stabilized  Acute respiratory failure requiring intubation COVID-19 positive- asymptomatic Now off vent. Breathing spontaneously and maintaining sats on 4L [place].  Some concern for her ability to protect her airway given no cough with extubation. Requires sedation to avoid pulling out drain.  -airborne/contact  -will defer covid related therapies as no hx of pt having symptoms, and cxr without infiltrates -Precedex sedation; PRN fentanyl for agitation  -SLP consult  Hypokalemia - Resolved Hypophosphatemia- K 3.5. Phos 2.0 -Replete -Trend    Best practice (right click and "Reselect all SmartList Selections" daily)  Diet:  Will obtain SLP consult; if not candidate for oral diet, will order Cortrak Pain/Anxiety/Delirium protocol (if indicated): Yes (RASS goal -1) VAP protocol (if indicated): Not indicated DVT prophylaxis: SCD GI prophylaxis: PPI Glucose control:  SSI No Central venous access:  Yes, and it is still needed Arterial line:  N/A Foley:  Yes, and it is still needed Mobility:  bed rest  PT consulted: N/A Last date of multidisciplinary goals of care discussion- N/A, plan for discussion with family today Code Status:  full code Disposition: ICU   Critical care time: 55 min    Critical care attending attestation note:  Patient seen and examined and relevant ancillary tests reviewed.  I agree with the assessment and plan of care as outlined by Jane Martin, Medical Student. The following reflects my independent critical care time.   Synopsis of assessment and plan: 25 year old female here with left  frontoparietal intraparenchymal hemorrhage with ICH score of 4 with intraventricular extension  Patient remains agitated and restless requiring Precedex infusion and frequent fentanyl boluses    Physical exam: General: Acutely ill young African-American female, lying in the bed HEENT: [place]/AT, eyes anicteric.  Left side of the face and head is edematous Neuro: Eyes open, does not follow commands, moving left side spontaneously, right side is plegic Chest: Coarse breath sounds, no wheezes or rhonchi Heart: Regular rate and rhythm, no murmurs or gallops Abdomen: Soft, nontender, nondistended, bowel sounds present Skin: No rash  Labs and images were reviewed  Assessment plan: Left frontoparietal intraparenchymal hemorrhage with intraventricular extension with ICH score of 4 Cerebral edema with midline shift status post craniectomy and evacuation Acute hypoxic respiratory failure Incidental COVID 19 infection Hypokalemia Hypophosphatemia  Continue neuro check every hour SBP goal less than 140 Neurology and neurosurgery are following Patient was extubated this morning but remains agitated Continue Precedex Continue airborne and contact isolation Supplemented monitor electrolytes   CRITICAL CARE Performed by: Cheri Fowler   Total independent critical care time: 40 minutes  Critical care time was exclusive of separately billable procedures and treating other patients.  Critical care was necessary to treat or prevent imminent or life-threatening deterioration.   Critical care was  time spent personally by me on the following activities: development of treatment plan with patient and/or surrogate as well as nursing, discussions with consultants, evaluation of patient's response to treatment, examination of patient, obtaining history from patient or surrogate, ordering and performing treatments and interventions, ordering and review of laboratory studies, ordering and review of  radiographic studies, pulse oximetry, re-evaluation of patient's condition and participation in multidisciplinary rounds.  Cheri Fowler MD [facility] See Amion for pager If no response to pager, please call [phone] until 7pm After 7pm, Please call E-link [phone]  10/20/2020, 10:35 AM

## 2020-10-20 NOTE — Progress Notes (Signed)
PT Cancellation Note  Patient Details Name: GRAYCE BUDDEN MRN: [id number] DOB: [birth year]   Cancelled Treatment:    Reason Eval/Treat Not Completed: Patient not medically ready at this time. Per RN, reduced sedation this AM and pt become agitated and started pulling at tubes/vent. Request to hold eval this AM. PT will continue to follow and evaluate as appropriate.   Rolm Baptise, PT, DPT   Acute Rehabilitation Department Pager #: [phone]  Gaetana Michaelis 10/20/2020, 8:25 AM

## 2020-10-20 NOTE — Procedures (Signed)
Extubation Procedure Note  Patient Details:   Name: SOLVEIG FANGMAN DOB: 06-28-1996 MRN: [id number]   Airway Documentation:    Vent end date: 10/20/20 Vent end time: 0850   Evaluation  O2 sats: stable throughout Complications: No apparent complications Patient did tolerate procedure well. Bilateral Breath Sounds: Clear   Patient extubated with MD at bedside. Patient tolerated well & placed on 4L . Patient does not follow commands so can't speak or cough on request. Patient in no distress at this time.  Jacqulynn Cadet 10/20/2020, 9:00 AM

## 2020-10-20 NOTE — Progress Notes (Signed)
Made Dr. Danielle Dess aware of moderate amount of drainage at crani site this morning that has slowed down and stopped over the last few hours.  Will continue to monitor.

## 2020-10-20 NOTE — Progress Notes (Signed)
OT Cancellation Note  Patient Details Name: Jane Martin MRN: [id number] DOB: 20-Dec-1995   Cancelled Treatment:    Reason Eval/Treat Not Completed: Medical issues which prohibited therapy;Patient not medically ready (Per RN, reduced sedation this AM and pt become agitated and started pulling at tubes/vent. Request to hold eval this AM. Will return as schedule allows.)  [name] M [name] [name] MSOT, OTR/L Acute Rehab Pager: [phone] Office: [phone] 10/20/2020, 8:17 AM

## 2020-10-20 NOTE — Progress Notes (Signed)
Na 122, Dr. Roda Shutters notified and will reorder Na draw for now.

## 2020-10-20 NOTE — TOC CAGE-AID Note (Signed)
Transition of Care [facility]) - CAGE-AID Screening   Patient Details  Name: Jane Martin MRN: [id number] Date of Birth: [birth year]  [facility]) CM/SW Contact:    Mearl Latin, LCSW Phone Number: 10/20/2020, 9:59 AM   Clinical Narrative: Patient intubated and unable to participate in screening.    CAGE-AID Screening: Substance Abuse Screening unable to be completed due to: : Patient unable to participate

## 2020-10-20 NOTE — Progress Notes (Signed)
Pt pulled out cortrak despite BL wrist restraints and precedex gtt. TF held at this time. No per tube meds due overnight, RN will continue to monitor.

## 2020-10-20 NOTE — Consult Note (Signed)
   [facility] Inpatient Consult   10/20/2020  Jane Martin 08/11/1995 [id number]    [facility] [[facility]]  Accountable Care Organization [ACO] Patient: [place] plan  Patient is in pending status for [facility] Care Management follow up for post hospital support.  Brief review for progress.   Plan: Patient will be followed by [facility] RN Care Coordinator per current plan.   For additional questions or referrals please contact:   Charlesetta Shanks, RN BSN CCM [facility]  [phone] business mobile phone T[place] Martin office [phone]  Fax number: [phone] [email or web address]

## 2020-10-20 NOTE — Progress Notes (Signed)
SLP Cancellation Note  Patient Details Name: NINI CAVAN MRN: [id number] DOB: 1995/08/13   Cancelled treatment:  Pt extubated this morning; cortrak placed - pt poorly responsive.  Not ready for speech/language or swallowing evaluations. Will follow along for readiness.  Hollyanne Schloesser L. Samson Frederic, MA CCC/SLP Acute Rehabilitation Services Office number [phone] Pager [phone]          [name] 10/20/2020, 11:36 AM

## 2020-10-20 NOTE — Progress Notes (Signed)
BLE Venous duplex completed.  Results can be found under chart review under CV PROC. 10/20/2020 5:08 PM Kyrell Ruacho RVT, RDMS

## 2020-10-20 NOTE — Progress Notes (Signed)
Initial Nutrition Assessment  DOCUMENTATION CODES:   Not applicable  INTERVENTION:   Initiate tube feeding via Cortrak tube: Osmolite 1.5 at 55 ml/h (1320 ml per day) Prosource TF 45 ml TID  Provides 2100 kcal, 115 gm protein, 1003 ml free water daily   NUTRITION DIAGNOSIS:   Increased nutrient needs related to post-op healing as evidenced by estimated needs.  GOAL:   Patient will meet greater than or equal to 90% of their needs  MONITOR:   TF tolerance  REASON FOR ASSESSMENT:   Consult Enteral/tube feeding initiation and management  ASSESSMENT:   Pt with no PMH admitted after collapsing at gas station with ICH, left parietal region with intraventricular extension, herniation syndrome s/p craniotomy for evacuation. Per neuro etiology unknown.   Pt discussed during ICU rounds and with RN.  Pt aphasic and plegic on R side. Not following commands.  COVID +  4/18 extubated; cortrak placed tip gastric  Medications reviewed and include: colace, miralax, senokot  Precedex Kphos x 1  3% hypertonic saline  Labs reviewed: Na 148, PO4: 2.0   UOP: 1450 ml  EVD: 0 ml  I&O: + 2 L  Diet Order:   Diet Order            Diet NPO time specified  Diet effective now                 EDUCATION NEEDS:   No education needs have been identified at this time  Skin:  Skin Assessment: Reviewed RN Assessment  Last BM:  unknown  Height:   Ht Readings from Last 1 Encounters:  10/18/20 5\' 8"  (1.727 m)    Weight:   Wt Readings from Last 1 Encounters:  10/17/20 72.6 kg    Ideal Body Weight:  63.6 kg  BMI:  Body mass index is 24.33 kg/m.  Estimated Nutritional Needs:   Kcal:  2000-2200  Protein:  95-115 grams  Fluid:  > 2 L/day  10/19/20., RD, LDN, CNSC See AMiON for contact information

## 2020-10-20 NOTE — Procedures (Signed)
Cortrak  Person Inserting Tube:  Larinda Herter, RD Tube Type:  Cortrak - 43 inches Tube Location:  Right nare Initial Placement:  Stomach Secured by: Bridle Technique Used to Measure Tube Placement:  Documented cm marking at nare/ corner of mouth Cortrak Secured At:  64 cm   No x-ray is required. RN may begin using tube.   If the tube becomes dislodged please keep the tube and contact the Cortrak team at [email or web address] ([email or web address]) for replacement.  If after hours and replacement cannot be delayed, place a NG tube and confirm placement with an abdominal x-ray.    Somnang Mahan RD, LDN Clinical Nutrition Pager listed in AMION    

## 2020-10-21 ENCOUNTER — Inpatient Hospital Stay (HOSPITAL_COMMUNITY): Payer: 59

## 2020-10-21 DIAGNOSIS — I615 Nontraumatic intracerebral hemorrhage, intraventricular: Secondary | ICD-10-CM | POA: Diagnosis not present

## 2020-10-21 DIAGNOSIS — I611 Nontraumatic intracerebral hemorrhage in hemisphere, cortical: Secondary | ICD-10-CM | POA: Diagnosis not present

## 2020-10-21 DIAGNOSIS — R410 Disorientation, unspecified: Secondary | ICD-10-CM | POA: Diagnosis not present

## 2020-10-21 LAB — CBC
HCT: 22.1 % — ABNORMAL LOW (ref 36.0–46.0)
Hemoglobin: 7.1 g/dL — ABNORMAL LOW (ref 12.0–15.0)
MCH: 30.7 pg (ref 26.0–34.0)
MCHC: 32.1 g/dL (ref 30.0–36.0)
MCV: 95.7 fL (ref 80.0–100.0)
Platelets: 128 10*3/uL — ABNORMAL LOW (ref 150–400)
RBC: 2.31 MIL/uL — ABNORMAL LOW (ref 3.87–5.11)
RDW: 12.8 % (ref 11.5–15.5)
WBC: 6.8 10*3/uL (ref 4.0–10.5)
nRBC: 0 % (ref 0.0–0.2)

## 2020-10-21 LAB — BASIC METABOLIC PANEL
Anion gap: 5 (ref 5–15)
BUN: <5 mg/dL — ABNORMAL LOW (ref 6–20)
CO2: 25 mmol/L (ref 22–32)
Calcium: 8.7 mg/dL — ABNORMAL LOW (ref 8.9–10.3)
Chloride: 118 mmol/L — ABNORMAL HIGH (ref 98–111)
Creatinine, Ser: 0.57 mg/dL (ref 0.44–1.00)
GFR, Estimated: >60 mL/min (ref >60)
Glucose, Bld: 122 mg/dL — ABNORMAL HIGH (ref 70–99)
Potassium: 3.3 mmol/L — ABNORMAL LOW (ref 3.5–5.1)
Sodium: 148 mmol/L — ABNORMAL HIGH (ref 135–145)

## 2020-10-21 LAB — SURGICAL PATHOLOGY

## 2020-10-21 LAB — GLUCOSE, CAPILLARY
Glucose-Capillary: 82 mg/dL (ref 70–99)
Glucose-Capillary: 93 mg/dL (ref 70–99)
Glucose-Capillary: 95 mg/dL (ref 70–99)
Glucose-Capillary: 98 mg/dL (ref 70–99)

## 2020-10-21 LAB — PHOSPHORUS: Phosphorus: 3.1 mg/dL (ref 2.5–4.6)

## 2020-10-21 LAB — SODIUM: Sodium: 149 mmol/L — ABNORMAL HIGH (ref 135–145)

## 2020-10-21 LAB — MAGNESIUM: Magnesium: 2 mg/dL (ref 1.7–2.4)

## 2020-10-21 MED ORDER — POTASSIUM CHLORIDE 10 MEQ/100ML IV SOLN
10.0000 meq | INTRAVENOUS | Status: AC
  Administered 2020-10-21 (×4): 10 meq via INTRAVENOUS
  Filled 2020-10-21 (×3): qty 100

## 2020-10-21 MED ORDER — LORAZEPAM 2 MG/ML IJ SOLN
2.0000 mg | Freq: Once | INTRAMUSCULAR | Status: AC
  Administered 2020-10-21: 2 mg via INTRAVENOUS
  Filled 2020-10-21: qty 1

## 2020-10-21 MED ORDER — LORAZEPAM 2 MG/ML IJ SOLN
INTRAMUSCULAR | Status: AC
  Administered 2020-10-21: 2 mg
  Filled 2020-10-21: qty 1

## 2020-10-21 MED ORDER — LORAZEPAM 2 MG/ML IJ SOLN
2.0000 mg | Freq: Four times a day (QID) | INTRAMUSCULAR | Status: DC | PRN
  Administered 2020-10-21: 2 mg via INTRAVENOUS
  Filled 2020-10-21 (×2): qty 1

## 2020-10-21 MED ORDER — BETHANECHOL CHLORIDE 10 MG PO TABS
10.0000 mg | ORAL_TABLET | Freq: Three times a day (TID) | ORAL | Status: DC
  Filled 2020-10-21: qty 1

## 2020-10-21 MED ORDER — SODIUM CHLORIDE 3 % IV SOLN: INTRAVENOUS | Status: DC

## 2020-10-21 MED ORDER — LABETALOL HCL 5 MG/ML IV SOLN: 5.0000 mg | INTRAVENOUS | Status: DC | PRN

## 2020-10-21 NOTE — Progress Notes (Signed)
Elink assisted video chat with patients father.

## 2020-10-21 NOTE — Progress Notes (Addendum)
Rehab Admissions Coordinator Note:  Patient was screened by Jane Martin for appropriateness for an Inpatient Acute Rehab Consult per PT recs. Patient not yet at a level to pursue rehab consult. I will follow as we await more tolerance to participate in aggressive therapies.    Inpatient rehab consult./prescreen received. Noted COVID + 10/17/2020. Patients are eligible to be considered for admit to the [facility] when cleared from airborne precautions by acute MD or Infectious disease regardless of onset day.  Please call me with any questions. I will not place rehab consult at this time.   Jane Dupes RN MSN 10/21/2020, 12:29 PM  I can be reached at [phone].

## 2020-10-21 NOTE — Progress Notes (Addendum)
NAME:  Jane Martin, MRN:  [id number], DOB:  06-May-1996, LOS: 4 ADMISSION DATE:  10/17/2020, CONSULTATION DATE:  10/17/20 REFERRING MD:  Amada Jupiter - neuro , CHIEF COMPLAINT:    History of Present Illness:  25 yo F was brought to ED 10/17/20 after saying "help me" and collapsing at a gas station. She was unresponsive by time of EMS arrival and per chart review at time of EMS pt had 40mm fixed pupils, disconjugate gaze, minimal response to pain and agonal respiraitons. In ED, patient intubated for airway protection given low GCS. Code stroke initiated and pt found to have a large acute L frontoparietal intraparenchymal hematoma, decompressed into L lateral ventricle and extends into third ventricle, with 1cm L to R midline shift. NSGY and Neuro consulted. The patient taken emergently to the OR and brought to 4N ICU following the case.   Pertinent  Medical History  Hypertension   Significant Hospital Events: Including procedures, antibiotic start and stop dates in addition to other pertinent events   . 4/15 BIB EMS after collapsing at gas station and losing consciousness. Large intraparenchymal hematoma.  Going to OR emergently. CCM consulted for expectant vent management  . 4/16 S/p craniotomy and drain. Repeat CT head with largely resolved mass effect and resolved midline shift, s/p hemorrhage evacuation with drain in place . 4/17 MRI with resolved midline shift. No mass or vascular lesion underlying hematoma . 4/18 extubated . 4/19 Doing well on RA, Global Aphasia, sedated on precedex    Interim History / Subjective:  Pt is calm and asleep at present. On RA with sats of 100%, RR of 20 On precedex at 0.8 3% saline at 50 cc's per hour. She pulled her Cor Track out 4/18, so no nutrition at present. T Max 97.7 Urinary retention>> had to place second Foley Cath  Global Aphasia Na 148, Mag 2, Phos 3.1 K is 3.3 Creatinine 0.57 Drain with 40 cc output this am, unsure if neuro will pull  today Net + 1700 cc's, 2300 cc's Urine last 24 hours  Objective   Blood pressure 111/79, pulse 65, temperature 97.7 F (36.5 C), temperature source Axillary, resp. rate 19, height 5\' 8"  (1.727 m), weight 72.6 kg, SpO2 99 %.       Intake/Output Summary (Last 24 hours) at 10/21/2020 0900 Last data filed at 10/21/2020 0819 Gross per 24 hour  Intake 2736.94 ml  Output 2446 ml  Net 290.94 ml   Filed Weights   10/17/20 1800 10/17/20 1825  Weight: 72.6 kg 72.6 kg   Physical Exam:  General- sedated with precedex, On RA with good sats, No distress ENT: NCAT, TM clear, pale nasal mucosa, no oral exudate, No LAD, No JVD Cardiac: S1, S2, regular rate and bradycardia, no murmur Chest: Bilateral chest excursion, No wheeze/ rales/ dullness; no accessory muscle use, no nasal flaring, no sternal retractions, Good volumes Abd.: Soft Non-tender, ND, BS +, Body mass index is 24.33 kg/m. Ext: No clubbing cyanosis, edema,no obvious deformities Neuro: Global Aphasia, RUE weakness, Sedated with Precedex due to agitation Skin: No rashes, warm and dry, No lesions noted     Labs/imaging that I havepersonally reviewed  (right click and "Reselect all SmartList Selections" daily)  4/15 CT H> 5.6x4.4cm acute L frontoparietal intraparenchymal hematoma, decompressed into L ventricle which contains significant amount of blood, which extends into third ventricle as well. 1cm L to R midline shift. Effacement of basilar cisterns including suprasellar and ambient cisterns   4/16 CT head post-op with  largely resolved mass effect and resolved midline shift, s/p hemorrhage evacuation with drain in place  4/17 MRI brain with resolved midline shift  Currently on 3% saline;  Na 148, Cl 118, Mag 2, Phos 3.1 K is 3.3 Creatinine 0.57 Drain with 40 cc output this am, unsure if neuro will pull today  CT Head 4/19>>  No recurrent hemorrhage. Decreased brain swelling and midline shift. No hydrocephalus.  Imaging,  labs and test noted above have been reviewed independently by me.  Resolved Hospital Problem list     Assessment & Plan:   IPH s/p emergent craniotomy 4/15 Large acute L frontoparietal intraparenchymal hematoma, decompressed into L ventricle, extending into third ventricle with associated L to R midline shift, with effacement of basilar cisterns. Post-op CT with evacuation of hemorrhage and largely resolved mass effect and midline shift. S/p mannitol. Suspect hypertensive bleed. Given long history of htn incommensurate with her age, will consider secondary hypertension workup once more stable.   -post op per NSGY  -imaging per NSGY / neuro -SBP goal <140 -hold chemical vte ppx  -secondary htn workup once stabilized  Acute respiratory failure requiring intubation COVID-19 positive- asymptomatic Now off vent.  Breathing spontaneously and maintaining sats on RA   Some concern for her ability to protect her airway given no cough with extubation.  Requires sedation to avoid pulling out drain.  Plan -airborne/contact  -will defer covid related therapies as no hx of pt having symptoms, and cxr without infiltrates -Precedex sedation; PRN fentanyl for agitation  -SLP consult - CXR prn  Hypokalemia - Resolved Hypophosphatemia- K 3.3. Phos 3.1 -Replete ( again 4/19) -Trend  Urinary retention Requiring second foley cath insertion Plan Foley Add Urecholine  Nutrition Plan Replace 4/20 when Cor Track  team is available     Best practice (right click and "Reselect all SmartList Selections" daily)  Diet:  Will obtain SLP consult; if not candidate for oral diet, will order Cortrak Pain/Anxiety/Delirium protocol (if indicated): Yes (RASS goal -1) VAP protocol (if indicated): Not indicated DVT prophylaxis: SCD GI prophylaxis: PPI Glucose control:  SSI No Central venous access:  Yes, and it is still needed Arterial line:  N/A Foley:  Yes, and it is still needed Mobility:  bed  rest  PT consulted: N/A Last date of multidisciplinary goals of care discussion- N/A, plan for discussion with family today Code Status:  full code Disposition: ICU   Critical care time: 35 minutes      CRITICAL CARE Performed by: Bevelyn Ngo, AGACNP-BC   Total independent critical care time: 35 minutes  Critical care time was exclusive of separately billable procedures and treating other patients.  Critical care was necessary to treat or prevent imminent or life-threatening deterioration.   Critical care was time spent personally by me on the following activities: development of treatment plan with patient and/or surrogate as well as nursing, discussions with consultants, evaluation of patient's response to treatment, examination of patient, obtaining history from patient or surrogate, ordering and performing treatments and interventions, ordering and review of laboratory studies, ordering and review of radiographic studies, pulse oximetry, re-evaluation of patient's condition and participation in multidisciplinary rounds.  Bevelyn Ngo, MSN, AGACNP-BC [facility] See Amion for personal pager PCCM on call pager [phone] 10/21/2020, 9:00 AM

## 2020-10-21 NOTE — Evaluation (Signed)
Clinical/Bedside Swallow Evaluation Patient Details  Name: DELLE Martin MRN: [id number] Date of Birth: Sep 08, 1995  Today's Date: 10/21/2020 Time: SLP Start Time (ACUTE ONLY): 1250 SLP Stop Time (ACUTE ONLY): 1303 SLP Time Calculation (min) (ACUTE ONLY): 13 min  Past Medical History: No past medical history on file. Past Surgical History:  Past Surgical History:  Procedure Laterality Date  . CRANIOTOMY Left 10/17/2020   Procedure: CRANIOTOMY HEMATOMA Evacuation;  Surgeon: Barnett Abu, MD;  Location: [facility] OR;  Service: Neurosurgery;  Laterality: Left;   HPI:  25 yo female presents to [facility] on 4/15 with collapse at gas station, followed by unresponsiveness. CTH show 5.6 cm in long axis acute left frontoparietal intraparenchymal hematoma, decompressing into the left lateral ventricle which contains a substantial amount of acute blood products which extend  into the third ventricle; 1.0 cm of left-to-right midline shift with effacement of the basilar cisterns. CTA head shows no aneurysm, LVO, or AVM. s/p L parietal crani with evacuation of intracerebral hemorrhage and placement of intraventricular catheter on 4/15. ETT 4/15-4/18. No pertinent PMH.   Assessment / Plan / Recommendation Clinical Impression  Pt participated in limited clinical swallow evaluation. She pulled cortrak out last night. Requires intermittent sedation during periods of restlessness and mild agitation.  During our session, she was restless, not following commands, able to focus on clinician briefly and would respond to questions with yes/no/short phrases with varying intelligibility. She required repetitive offering of water with straw before she was able to recognize and follow-through with action necessary to take sips.  She consumed two oz with no s/s of aspiration.  She demonstrated no awareness of ice chips or pudding placed on lips despite tactile/verbal cues.  At this time, she is not able to attain the level of arousal  nor focus necessary to meet her nutritional needs. Recommend replacing cortrak. Allow sips of thin liquids when she is alert and able to sustain her attention.  SLP will follow along for readiness for greater variety of POs as well as Speech/language evaluation. D/W RN. SLP Visit Diagnosis: Dysphagia, oropharyngeal phase (R13.12)    Aspiration Risk  Mild aspiration risk    Diet Recommendation   replace cortrak; allow thin liquids when alert  Medication Administration: Via alternative means    Other  Recommendations Oral Care Recommendations: Oral care BID   Follow up Recommendations Inpatient Rehab      Frequency and Duration min 2x/week  2 weeks       Prognosis Prognosis for Safe Diet Advancement: Good      Swallow Study   General Date of Onset: 10/17/20 HPI: 25 yo female presents to [facility] on 4/15 with collapse at gas station, followed by unresponsiveness. CTH show 5.6 cm in long axis acute left frontoparietal intraparenchymal hematoma, decompressing into the left lateral ventricle which contains a substantial amount of acute blood products which extend  into the third ventricle; 1.0 cm of left-to-right midline shift with effacement of the basilar cisterns. CTA head shows no aneurysm, LVO, or AVM. s/p L parietal crani with evacuation of intracerebral hemorrhage and placement of intraventricular catheter on 4/15. ETT 4/15-4/18. No pertinent PMH. Type of Study: Bedside Swallow Evaluation Previous Swallow Assessment: no Diet Prior to this Study: NPO Temperature Spikes Noted: No Respiratory Status: Room air History of Recent Intubation: Yes Length of Intubations (days): 3 days Date extubated: 10/20/20 Behavior/Cognition: Alert;Confused;Impulsive Oral Care Completed by SLP: No Oral Cavity - Dentition: Adequate natural dentition Self-Feeding Abilities: Needs assist Patient Positioning: Upright in bed  Baseline Vocal Quality: Normal Volitional Cough: Cognitively unable to  elicit Volitional Swallow: Unable to elicit    Oral/Motor/Sensory Function Overall Oral Motor/Sensory Function: Other (comment)   Ice Chips Ice chips: Impaired Oral Phase Impairments: Poor awareness of bolus   Thin Liquid Thin Liquid: Within functional limits Presentation: Straw    Nectar Thick Nectar Thick Liquid: Not tested   Honey Thick Honey Thick Liquid: Not tested   Puree Puree: Impaired Presentation: Spoon Oral Phase Impairments: Poor awareness of bolus Oral Phase Functional Implications: Right anterior spillage;Left anterior spillage   Solid     Solid: Not tested      Jane Martin 10/21/2020,2:22 PM    Jane Folks L. Samson Frederic, MA CCC/SLP Acute Rehabilitation Services Office number [phone] Pager [phone]

## 2020-10-21 NOTE — Progress Notes (Signed)
Patient ID: Jane Martin, female   DOB: 06-03-1996, 25 y.o.   MRN: [id number] Patient has been more arousable.  He has had some verbalization though following of commands is not completely apparent.  He is plegic in the right upper extremity.  Pathology notes normal brain parenchyma.  IVC output has been minimal.  I have therefore removed the catheter today after painting the area with Betadine catheter was removed without difficulty.  Catheter site was stapled shut with 2 surgical staples.  Patient tolerated well.  Plan continue to monitor.  I will review the pathology with the pathologist.

## 2020-10-21 NOTE — Progress Notes (Addendum)
STROKE TEAM PROGRESS NOTE   SUBJECTIVE (INTERVAL HISTORY) Extubated yesterday, Na stable at 149  Patient remains intubated, sedated. Per nursing she became fully awake, very agitated, thrashing in bed requiring sedation bolus this morning.  She is responsive to voice with brief eye opening and moving all 4 extremities spontaneously but still with apparent RUE weakness. CCM considering extubation today. No visitors at bedside.   OBJECTIVE Vitals:   10/21/20 0900 10/21/20 1000 10/21/20 1100 10/21/20 1214  BP: 120/86 132/83 126/85 120/83  Pulse: (!) 57 (!) 55 (!) 51   Resp: 18 20 19 18   Temp:      TempSrc:      SpO2: 98% 100% 98% 100%  Weight:      Height:       CBC:  Recent Labs  Lab 10/17/20 1740 10/17/20 1749 10/20/20 0500 10/20/20 1800 10/21/20 0520  WBC 6.3   < > 5.6  --  6.8  NEUTROABS 1.8  --   --   --   --   HGB 13.8   < > 7.0* 7.2* 7.1*  HCT 41.7   < > 21.5* 23.2* 22.1*  MCV 93.3   < > 97.7  --  95.7  PLT 250   < > 124*  --  128*   < > = values in this interval not displayed.   Basic Metabolic Panel:  Recent Labs  Lab 10/20/20 0500 10/20/20 1200 10/20/20 1800 10/20/20 1820 10/21/20 0009 10/21/20 0520  NA 148*   < >  --    < > 149* 148*  K 3.5  --   --   --   --  3.3*  CL 124*  --   --   --   --  118*  CO2 19*  --   --   --   --  25  GLUCOSE 87  --   --   --   --  122*  BUN <5*  --   --   --   --  <5*  CREATININE 0.52  --   --   --   --  0.57  CALCIUM 8.2*  --   --   --   --  8.7*  MG 2.0  --  2.0  --   --  2.0  PHOS 2.0*  --  3.8  --   --  3.1   < > = values in this interval not displayed.    Lipid Panel:  Recent Labs  Lab 10/18/20 0427 10/18/20 0428  CHOL 108  --   TRIG 29 29  HDL 35*  --   CHOLHDL 3.1  --   VLDL 6  --   LDLCALC 67  --    HgbA1c:  Lab Results  Component Value Date   HGBA1C 5.6 10/18/2020   Urine Drug Screen:     Component Value Date/Time   LABOPIA NONE DETECTED 10/18/2020 0030   COCAINSCRNUR NONE DETECTED 10/18/2020  0030   LABBENZ NONE DETECTED 10/18/2020 0030   AMPHETMU NONE DETECTED 10/18/2020 0030   THCU POSITIVE (A) 10/18/2020 0030   LABBARB NONE DETECTED 10/18/2020 0030    Alcohol Level     Component Value Date/Time   ETH <10 10/17/2020 1740   IMAGING CT head 4/15 left frontoparietal intraparenchymal hematoma, with IVH. 1.0 cm of left-to-right midline shift with effacement of the basilar cisterns  CTA Head and neck  4/15 Unremarkable, no AVM or aneurysm  Brain MRI 4/17 No mass or definite  vascular lesion underlying the evacuated left parietal hematoma. There is accentuated intravascular enhancement along the superficial cavity which may be reactive; catheter angiography would be contributory. Resolved midline shift. No hydrocephalus  MRV 4/17 No evidence for venous thrombosis    Repeat CT head 4/19 No recurrent hemorrhage. Decreased brain swelling and midline shift. No hydrocephalus.  2D Echo 1. Left ventricular ejection fraction, by estimation, is 55 to 60%. The  left ventricle has normal function. The left ventricle has no regional  wall motion abnormalities. Left ventricular diastolic function could not  be evaluated.  2. Right ventricular systolic function is normal. The right ventricular  size is normal.  3. The mitral valve is normal in structure. No evidence of mitral valve  regurgitation.  4. The aortic valve is normal in structure. Aortic valve regurgitation is not visualized.   PHYSICAL EXAM GENERAL: Young female lying in bed with eyes closed in NAD.  Sedated on maximum dose precedex due to overnight agitation HEENT: - Normocephalic and atraumatic, dry mm, EVD present, NG tube, and ETT LUNGS - Clear to auscultation bilaterally with no wheezes CV - S1S2 RRR, no m/r/g, equal pulses bilaterally. ABDOMEN - Soft, nontender, nondistended with normoactive BS  NEURO:  Mental Status: sedated, Does not open eyes to stimulation. Briefly resists eye opening for exam. No  response to tactile or noxious stimuli.  Language: No verbal response at present Cranial Nerves  Pupils Right pupil 2 and sluggish, left pupil 2 and sluggish.  EOMI visual fields no blink to threat bliaterally,  bilateral corneals  facial asymmetry unable to assess, right periorbital edema is improving Motor: Responds to noxious stimuli, moves left arm purposeful Tone: is normal and bulk is normal Sensation- right abnormal  Coordination: unable to assess Gait- deferred  ASSESSMENT/PLAN Ms. DIMOND CROTTY is a 25 y.o. female with no history, presented to ED by EMS after collapsing at a gas station. It was told that she yelled out for help and for someone to call 911, then collapsed to the ground. When EMS arrived, she was unresponsive which worsened to comatose. Intubated in ED.    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 Etiology unknown.  Repeat HCT today stable  On Keppra   VTE prophylaxis on hold per CCM  No antithrombotic prior to admission, now on No antithrombotic  On HTS at 50cc per hour, NA 149->148, will address tapering after repeat imaging tomorrow  Ongoing aggressive stroke risk factor management  Therapy recommendations:  Pending  Disposition:  TBD  Cerebral edema  CT and MRI post hematoma evaluation showed resolved midline shift  3% saline weaned off   Na 146-153-148->149  ? Abnormal brain tissue  Per Dr. Danielle Dess op note "a small cluster of tissue was found that was cauterized and removed in a singular piece.  After this it seemed that the hemorrhage subsided considerably. Several biopsies of the surrounding brain were obtained." Not sure if sent for pathology  MRI - there is accentuated intravascular enhancement along the superficial cavity which may be reactive; catheter angiography may be considered when patient is more stable.  Will do cerebral angiogram  once stable   Respiratory failure  Extubated 4/18, CCM monitoring airway protection due to AMS  CCM on board  Change sedation to precedex  Extubated 10/20/20  Hypertension . BP goal < 160 . SBP low normal to high 90s on cuff pressure  . Cleviprex PRN for volume control . Off  A line . Dr. Merrily Pew considering secondary HTN work up when more stable   Birth control  Mom stated that pt has a hormone depo for birth control  Not sure the name  Concerning both estrogen and progesterone containing  Pt not smoker but vaping - mom not sure if nicotine containing  MRV showed no evidence for cerebral sinus venous thrombosis.  COVID positive  Asymptomatic  Mom does not know any potential exposure  Acute blood loss anemia  Hgb 9.6->8.8->8.1->7.0  Monitor  CCM on board  Dysphagia, Feeding/Nutrition  cortrak tube placed and pulled out by patient overnight 4/19, plan to replace 4/20 when team is available  TF Osmolite per protocol  Dietitian on board  Urinary Retention  Foley placed 4/18  On urecholine, voiding trial planned 4/20  Other Stroke Risk Factors  Vaping - ? Nicotine or non-nicotine (mom not sure)   Family hx stroke (Mom had a stroke)   Other Active Problems    Hospital day # 4  This plan of care was directed by Dr. Roda Shutters.  Shon Hale, NP-C  ATTENDING NOTE: I reviewed above note and agree with the assessment and plan. Pt was seen and examined.   Pt was extubated yesterday and tolerating well. Overnight, she has intermittent agitation, put on precedex but she still managed to remove cortrak. Currently again NPO on IVF. Per RN, pt was more awake alert today and able to spontaneously speaking some words which made sense, such as "live me alone" but most time still word salad. Drainage tube still in place, with minimal drainage, and Dr. Danielle Dess is planing to remove it.   On exam, patient eyes initially closed, but with repetitive stimulation, she  was able to open eyes, mumbling words with word salad, but not follow commands. With eye opening, right gaze palsy bilaterally, right eye able to have adduction but left eye abduction incomplete, PERRL. Bilateral orbital swelling but much improved, left more than right. Right mild facial droop. RUE bicep 3-/5, but not able to against gravity with pain. Right lower extremity flaccid, no movement with pain. Left upper and lower extremitiesspontaneous movement against gravity. Sensation, coordination and gait not tested.  CT repeat this am showed decreasing cerebral edema and midline shift. Will taper off 3% saline. Once more stable, will discuss with pt mom for cerebral angiogram.  Still severe anemia but stable around 7.0. will need transfuse if lower than 7.0. will do cortrak again in am, once placed will start po meds such as seroquel for agitation management.   For detailed assessment and plan, please refer to above as I have made changes wherever appropriate.   Marvel Plan, MD PhD Stroke Neurology 10/21/2020 11:52 PM  This patient is critically ill due to large left frontal ICH s/p hematoma evacuation, respiratory failure and at significant risk of neurological worsening, death form cerebral edema, brain herniation. This patient's care requires constant monitoring of vital signs, hemodynamics, respiratory and cardiac monitoring, review of multiple databases, neurological assessment, discussion with family, other specialists and medical decision making of high complexity. I spent 40 minutes of neurocritical care time in the care of this patient.    To contact Stroke Continuity provider, please refer to [email or web address]. After hours, contact General Neurology

## 2020-10-21 NOTE — Evaluation (Signed)
Physical Therapy Evaluation Patient Details Name: Jane Martin MRN: [id number] DOB: May 12, 1996 Today's Date: 10/21/2020   History of Present Illness  25 yo female presents to [facility] on 4/15 with collapse at gas station, followed by unresponsiveness. CTH show 5.6 cm in long axis acute left frontoparietal intraparenchymal hematoma, decompressing into the left lateral ventricle which contains a substantial amount of acute blood products which extend  into the third ventricle; 1.0 cm of left-to-right midline shift with effacement of the basilar cisterns. CTA head shows no aneurysm, LVO, or AVM. s/p L parietal crani with evacuation of intracerebral hemorrhage and placement of intraventricular catheter on 4/15. ETT 4/15-4/18. No pertinent PMH.  Clinical Impression   Pt presents with flaccidity of RLE/UE, poor attention to R, poor sitting balance, severe difficulty performing mobility tasks, AMS, and decreased activity tolerance. Pt to benefit from acute PT to address deficits. Pt requiring total +2 assist today for moving to/from EOB, maintaining EOB sitting x5 minutes. Pt initially limited by low level arousal, as session progressed pt became increasingly agitated and cursing at therapists, pt returned to supine for safety. At baseline, pt is completely independent. PT recommending CIR post-acutely to address deficits and maximize functional recovery. PT to progress mobility as tolerated, and will continue to follow acutely.      Follow Up Recommendations CIR    Equipment Recommendations  Other (comment) (TBD)    Recommendations for Other Services       Precautions / Restrictions Precautions Precautions: Fall Precaution Comments: intraventricular catheter (clamped by RN during mobility), not attending to R Restrictions Weight Bearing Restrictions: No      Mobility  Bed Mobility Overal bed mobility: Needs Assistance Bed Mobility: Supine to Sit;Sit to Supine     Supine to sit: Total  assist;+2 for physical assistance Sit to supine: Total assist;+2 for physical assistance   General bed mobility comments: total +2 for all aspects, limited by initial low level of arousal and poor command following.    Transfers                 General transfer comment: unable - pt not following commands and increasingly restless sitting EOB  Ambulation/Gait                Stairs            Wheelchair Mobility    Modified Rankin (Stroke Patients Only) Modified Rankin (Stroke Patients Only) Pre-Morbid Rankin Score: No symptoms Modified Rankin: Severe disability     Balance Overall balance assessment: Needs assistance Sitting-balance support: Single extremity supported;Feet supported Sitting balance-Leahy Scale: Poor Sitting balance - Comments: Multidirectional LOB, LOB in direction of head movement. PT supporting pt trunk posteriorly, requiring min-mod assist                                     Pertinent Vitals/Pain Pain Assessment: Faces Faces Pain Scale: Hurts a little bit Pain Location: generalized discomfort, localized pain response with painful stim on L and generalized response (LUE movement) with painful stim on RUE/LE Pain Descriptors / Indicators: Discomfort;Restless Pain Intervention(s): Limited activity within patient's tolerance;Monitored during session;Repositioned    Home Living Family/patient expects to be discharged to:: Private residence Living Arrangements: Parent (mother; works in lab at [employer]) Available Help at Discharge: Family Type of Home: Apartment Home Access: Stairs to enter   Secretary/administrator of Steps: flight Home Layout: Able to live on main level  with bedroom/bathroom Home Equipment: None      Prior Function Level of Independence: Independent         Comments: All subjective information gathered from pt's father via video chat; per pt's father PTA, pt was independent and working as Science writer at [employer]   Dominant Hand: Right    Extremity/Trunk Assessment   Upper Extremity Assessment Upper Extremity Assessment: Defer to OT evaluation    Lower Extremity Assessment Lower Extremity Assessment: RLE deficits/detail;Difficult to assess due to impaired cognition;LLE deficits/detail RLE Deficits / Details: no movement observed, RLE movement with painful stimuli LLE Deficits / Details: hip flex/ext, knee flex/ext observed during exam, unable to follow commands at this time    Cervical / Trunk Assessment Cervical / Trunk Assessment: Normal  Communication   Communication: Other (comment) (difficult to discern given low level of arousal)  Cognition Arousal/Alertness: Lethargic Behavior During Therapy: Restless;Agitated (once EOB) Overall Cognitive Status: Difficult to assess                                 General Comments: Pt not following commands this day, very low arousal until sitting EOB where pt got increasingly restless, cursing at OT during session and clearly stating "get off me"      General Comments General comments (skin integrity, edema, etc.): VSS on RA    Exercises     Assessment/Plan    PT Assessment Patient needs continued PT services  PT Problem List Decreased strength;Decreased mobility;Decreased activity tolerance;Decreased balance;Decreased knowledge of use of DME;Pain;Impaired sensation;Decreased cognition;Decreased knowledge of precautions;Decreased coordination;Decreased safety awareness;Impaired tone       PT Treatment Interventions DME instruction;Therapeutic activities;Gait training;Therapeutic exercise;Patient/family education;Balance training;Stair training;Neuromuscular re-education;Functional mobility training    PT Goals (Current goals can be found in the Care Plan section)  Acute Rehab PT Goals PT Goal Formulation: With patient/family Time For Goal Achievement: 11/04/20 Potential to  Achieve Goals: Fair    Frequency Min 4X/week   Barriers to discharge        Co-evaluation PT/OT/SLP Co-Evaluation/Treatment: Yes Reason for Co-Treatment: For patient/therapist safety;To address functional/ADL transfers;Necessary to address cognition/behavior during functional activity PT goals addressed during session: Mobility/safety with mobility;Balance         AM-PAC PT "6 Clicks" Mobility  Outcome Measure Help needed turning from your back to your side while in a flat bed without using bedrails?: Total Help needed moving from lying on your back to sitting on the side of a flat bed without using bedrails?: Total Help needed moving to and from a bed to a chair (including a wheelchair)?: Total Help needed standing up from a chair using your arms (e.g., wheelchair or bedside chair)?: Total Help needed to walk in hospital room?: Total Help needed climbing 3-5 steps with a railing? : Total 6 Click Score: 6    End of Session   Activity Tolerance: Treatment limited secondary to agitation;Patient limited by fatigue Patient left: in bed;with call bell/phone within reach;with bed alarm set;with nursing/sitter in room;with restraints reapplied;with SCD's reapplied Nurse Communication: Mobility status PT Visit Diagnosis: Other abnormalities of gait and mobility (R26.89);Difficulty in walking, not elsewhere classified (R26.2);Hemiplegia and hemiparesis Hemiplegia - Right/Left: Right Hemiplegia - dominant/non-dominant: Dominant Hemiplegia - caused by: Other cerebrovascular disease    Time: 1010-1032 PT Time Calculation (min) (ACUTE ONLY): 22 min   Charges:   PT Evaluation $PT Eval Moderate Complexity: 1 Mod  Marye Round, PT DPT Acute Rehabilitation Services Pager [phone]  Office [phone]   Truddie Coco 10/21/2020, 10:52 AM

## 2020-10-21 NOTE — Evaluation (Signed)
Occupational Therapy Evaluation Patient Details Name: Jane Martin MRN: [id number] DOB: [birth year] Today's Date: 10/21/2020    History of Present Illness 25 yo female presents to [facility] on 4/15 with collapse at gas station, followed by unresponsiveness. CTH show 5.6 cm in long axis acute left frontoparietal intraparenchymal hematoma, decompressing into the left lateral ventricle which contains a substantial amount of acute blood products which extend  into the third ventricle; 1.0 cm of left-to-right midline shift with effacement of the basilar cisterns. CTA head shows no aneurysm, LVO, or AVM. s/p L parietal crani with evacuation of intracerebral hemorrhage and placement of intraventricular catheter on 4/15. ETT 4/15-4/18. No pertinent PMH.   Clinical Impression   PTA, pt was living with her mother and was independent working full time; information collected from father via video call at beginning of session. Pt currently requiring Total A for ADLs and bed mobility. Pt presenting with poor functional use of RUE, vision, attention, cognition, balance, strength, and activity tolerance. Generally lethargic and low arousal. Pt becoming restless and slightly agitated while sitting at EOB with Total A for support. VSS throughout. RN present. Pt would benefit from further acute OT to facilitate safe dc. Recommend dc to CIR for further OT to optimize safety, independence with ADLs, and return to PLOF.     Follow Up Recommendations  CIR    Equipment Recommendations  Other (comment) (defer to next venue)    Recommendations for Other Services PT consult     Precautions / Restrictions Precautions Precautions: Fall Precaution Comments: intraventricular catheter (clamped by RN during mobility), not attending to R Restrictions Weight Bearing Restrictions: No      Mobility Bed Mobility Overal bed mobility: Needs Assistance Bed Mobility: Supine to Sit;Sit to Supine     Supine to sit: Total  assist;+2 for physical assistance Sit to supine: Total assist;+2 for physical assistance   General bed mobility comments: total +2 for all aspects, limited by initial low level of arousal and poor command following.    Transfers                 General transfer comment: unable - pt not following commands and increasingly restless sitting EOB    Balance Overall balance assessment: Needs assistance Sitting-balance support: Single extremity supported;Feet supported Sitting balance-Leahy Scale: Poor Sitting balance - Comments: Multidirectional LOB, LOB in direction of head movement. PT supporting pt trunk posteriorly, requiring min-mod assist                                   ADL either performed or assessed with clinical judgement   ADL Overall ADL's : Needs assistance/impaired Eating/Feeding: NPO                                     General ADL Comments: Total A for all ADLs due to cognition and arousal level.     Vision   Vision Assessment?: Vision impaired- to be further tested in functional context Additional Comments: Difficult to assess due to cognition and arousal level. L gaze and head turn preference. Not attending to R side of body and environ'ment     Perception Perception Perception Tested?: Yes Perception Deficits: Inattention/neglect Inattention/Neglect: Does not attend to right visual field;Does not attend to right side of body   Praxis      Pertinent Vitals/Pain Pain  Assessment: Faces Faces Pain Scale: Hurts a little bit Pain Location: generalized discomfort, localized pain response with painful stim on L and generalized response (LUE movement) with painful stim on RUE/LE Pain Descriptors / Indicators: Discomfort;Restless Pain Intervention(s): Monitored during session;Repositioned;Limited activity within patient's tolerance     Hand Dominance Right   Extremity/Trunk Assessment Upper Extremity Assessment Upper  Extremity Assessment: RUE deficits/detail;LUE deficits/detail RUE Deficits / Details: No active movement. With painful stimuli, pt moving LUE LUE Deficits / Details: Poor coorindation and difficult to assess with cognition and arousal. WFL for strength LUE Coordination: decreased fine motor;decreased gross motor   Lower Extremity Assessment Lower Extremity Assessment: Defer to PT evaluation RLE Deficits / Details: no movement observed, RLE movement with painful stimuli LLE Deficits / Details: hip flex/ext, knee flex/ext observed during exam, unable to follow commands at this time   Cervical / Trunk Assessment Cervical / Trunk Assessment: Normal;Other exceptions Cervical / Trunk Exceptions: poor trunk control   Communication Communication Communication: Other (comment) (difficult to discern given low level of arousal)   Cognition Arousal/Alertness: Lethargic Behavior During Therapy: Restless;Agitated (once EOB restless and slightly agitated) Overall Cognitive Status: Difficult to assess                                 General Comments: Pt not following commands this day, very low arousal until sitting EOB where pt got increasingly restless, cursing at therapist during session and clearly stating "get the F--- off me"   General Comments  VSS on RA    Exercises     Shoulder Instructions      Home Living Family/patient expects to be discharged to:: Private residence Living Arrangements: Parent (mother; works in lab at [employer]) Available Help at Discharge: Family Type of Home: Apartment Home Access: Stairs to enter Secretary/administrator of Steps: flight   Home Layout: Able to live on main level with bedroom/bathroom     Bathroom Shower/Tub: Tub/shower unit;Walk-in shower   Bathroom Toilet: Standard     Home Equipment: None   Additional Comments: Some information from father inconsistent      Prior Functioning/Environment Level of Independence: Independent         Comments: All subjective information gathered from pt's father via video chat; per pt's father PTA, 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 [employer]        OT Problem List: Decreased strength;Decreased range of motion;Decreased activity tolerance;Impaired balance (sitting and/or standing);Decreased cognition;Decreased safety awareness;Decreased knowledge of use of DME or AE;Impaired vision/perception;Decreased coordination;Decreased knowledge of precautions;Pain;Impaired UE functional use      OT Treatment/Interventions: Self-care/ADL training;Therapeutic exercise;Energy conservation;DME and/or AE instruction;Therapeutic activities;Patient/family education;Balance training    OT Goals(Current goals can be found in the care plan section) Acute Rehab OT Goals Patient Stated Goal: Unstated OT Goal Formulation: Patient unable to participate in goal setting Time For Goal Achievement: 11/04/20 Potential to Achieve Goals: Good ADL Goals Pt Will Perform Grooming: with mod assist;bed level;sitting Pt Will Perform Upper Body Bathing: with mod assist;sitting;bed level Pt Will Transfer to Toilet: with mod assist;with +2 assist;stand pivot transfer;bedside commode Additional ADL Goal #1: Pt will follow one step commands during ADLs with Mod cues Additional ADL Goal #2: Pt will attend to ADL items in R visual field during ADL with Mod cues Additional ADL Goal #3: Pt will tolerate sitting at EOB for 10 minutes with Min A in preparation for ADLs  OT Frequency:  Min 2X/week   Barriers to D/C:            Co-evaluation PT/OT/SLP Co-Evaluation/Treatment: Yes Reason for Co-Treatment: For patient/therapist safety;To address functional/ADL transfers PT goals addressed during session: Mobility/safety with mobility;Balance OT goals addressed during session: ADL's and self-care      AM-PAC OT "6 Clicks" Daily Activity     Outcome Measure Help from another person eating meals?:  Total Help from another person taking care of personal grooming?: Total Help from another person toileting, which includes using toliet, bedpan, or urinal?: Total Help from another person bathing (including washing, rinsing, drying)?: Total Help from another person to put on and taking off regular upper body clothing?: Total Help from another person to put on and taking off regular lower body clothing?: Total 6 Click Score: 6   End of Session Nurse Communication: Mobility status  Activity Tolerance: Patient limited by lethargy;Treatment limited secondary to agitation Patient left: in bed;with call bell/phone within reach;with bed alarm set;with restraints reapplied  OT Visit Diagnosis: Unsteadiness on feet (R26.81);Other abnormalities of gait and mobility (R26.89);Muscle weakness (generalized) (M62.81);Hemiplegia and hemiparesis;Pain;Low vision, both eyes (H54.2);Other symptoms and signs involving cognitive function Hemiplegia - Right/Left: Right Hemiplegia - caused by: Cerebral infarction                Time: 1006-1031 OT Time Calculation (min): 25 min Charges:  OT General Charges $OT Visit: 1 Visit OT Evaluation $OT Eval Moderate Complexity: 1 Mod  Nasrin Lanzo MSOT, OTR/L Acute Rehab Pager: [phone] Office: [phone]  Theodoro Grist Lenya Sterne 10/21/2020, 1:17 PM

## 2020-10-22 DIAGNOSIS — G9340 Encephalopathy, unspecified: Secondary | ICD-10-CM | POA: Diagnosis not present

## 2020-10-22 DIAGNOSIS — I611 Nontraumatic intracerebral hemorrhage in hemisphere, cortical: Secondary | ICD-10-CM | POA: Diagnosis not present

## 2020-10-22 LAB — CBC
HCT: 23.3 % — ABNORMAL LOW (ref 36.0–46.0)
Hemoglobin: 7.6 g/dL — ABNORMAL LOW (ref 12.0–15.0)
MCH: 30.6 pg (ref 26.0–34.0)
MCHC: 32.6 g/dL (ref 30.0–36.0)
MCV: 94 fL (ref 80.0–100.0)
Platelets: 162 10*3/uL (ref 150–400)
RBC: 2.48 MIL/uL — ABNORMAL LOW (ref 3.87–5.11)
RDW: 12.3 % (ref 11.5–15.5)
WBC: 6.3 10*3/uL (ref 4.0–10.5)
nRBC: 0 % (ref 0.0–0.2)

## 2020-10-22 LAB — HEPATIC FUNCTION PANEL
ALT: 19 U/L (ref 0–44)
AST: 30 U/L (ref 15–41)
Albumin: 3.1 g/dL — ABNORMAL LOW (ref 3.5–5.0)
Alkaline Phosphatase: 51 U/L (ref 38–126)
Bilirubin, Direct: 0.1 mg/dL (ref 0.0–0.2)
Indirect Bilirubin: 0.6 mg/dL (ref 0.3–0.9)
Total Bilirubin: 0.7 mg/dL (ref 0.3–1.2)
Total Protein: 6.1 g/dL — ABNORMAL LOW (ref 6.5–8.1)

## 2020-10-22 LAB — GLUCOSE, CAPILLARY
Glucose-Capillary: 111 mg/dL — ABNORMAL HIGH (ref 70–99)
Glucose-Capillary: 135 mg/dL — ABNORMAL HIGH (ref 70–99)
Glucose-Capillary: 74 mg/dL (ref 70–99)
Glucose-Capillary: 82 mg/dL (ref 70–99)
Glucose-Capillary: 92 mg/dL (ref 70–99)

## 2020-10-22 LAB — BASIC METABOLIC PANEL
Anion gap: 9 (ref 5–15)
BUN: 5 mg/dL — ABNORMAL LOW (ref 6–20)
CO2: 22 mmol/L (ref 22–32)
Calcium: 8.9 mg/dL (ref 8.9–10.3)
Chloride: 116 mmol/L — ABNORMAL HIGH (ref 98–111)
Creatinine, Ser: 0.58 mg/dL (ref 0.44–1.00)
GFR, Estimated: >60 mL/min (ref >60)
Glucose, Bld: 93 mg/dL (ref 70–99)
Potassium: 3.2 mmol/L — ABNORMAL LOW (ref 3.5–5.1)
Sodium: 147 mmol/L — ABNORMAL HIGH (ref 135–145)

## 2020-10-22 LAB — MAGNESIUM: Magnesium: 1.8 mg/dL (ref 1.7–2.4)

## 2020-10-22 MED ORDER — BETHANECHOL CHLORIDE 10 MG PO TABS
10.0000 mg | ORAL_TABLET | Freq: Three times a day (TID) | ORAL | Status: DC
  Administered 2020-10-22 – 2020-10-28 (×20): 10 mg
  Filled 2020-10-22 (×21): qty 1

## 2020-10-22 MED ORDER — CLONAZEPAM 0.5 MG PO TABS
0.5000 mg | ORAL_TABLET | Freq: Two times a day (BID) | ORAL | Status: DC | PRN
  Administered 2020-10-22: 0.5 mg via ORAL
  Filled 2020-10-22: qty 1

## 2020-10-22 MED ORDER — LEVETIRACETAM 500 MG PO TABS
500.0000 mg | ORAL_TABLET | Freq: Two times a day (BID) | ORAL | Status: DC
  Administered 2020-10-22 – 2020-10-30 (×16): 500 mg via ORAL
  Filled 2020-10-22 (×16): qty 1

## 2020-10-22 MED ORDER — POTASSIUM CHLORIDE 10 MEQ/100ML IV SOLN
10.0000 meq | INTRAVENOUS | Status: AC
  Administered 2020-10-22 (×4): 10 meq via INTRAVENOUS
  Filled 2020-10-22 (×5): qty 100

## 2020-10-22 NOTE — Progress Notes (Signed)
Patient ID: Jane Martin, female   DOB: 28-Jan-1996, 25 y.o.   MRN: [id number] Level of consciousness appears good today.  She is speaking though incoherently.  Motor function is good save for right upper extremity.  Continues steady with recovery.  Incision remains clean and dry

## 2020-10-22 NOTE — Progress Notes (Signed)
NAME:  ERNESTO LASHWAY, MRN:  [id number], DOB:  [birth year], LOS: 5 ADMISSION DATE:  10/17/2020, CONSULTATION DATE:  10/17/20 REFERRING MD:  Amada Jupiter - neuro , CHIEF COMPLAINT:    History of Present Illness:  25 yo F was brought to ED 10/17/20 after saying "help me" and collapsing at a gas station. She was unresponsive by time of EMS arrival and per chart review at time of EMS pt had 50mm fixed pupils, disconjugate gaze, minimal response to pain and agonal respiraitons. In ED, patient intubated for airway protection given low GCS. Code stroke initiated and pt found to have a large acute L frontoparietal intraparenchymal hematoma, decompressed into L lateral ventricle and extends into third ventricle, with 1cm L to R midline shift. NSGY and Neuro consulted. The patient taken emergently to the OR and brought to 4N ICU following the case.   Pertinent  Medical History  Hypertension   Significant Hospital Events: Including procedures, antibiotic start and stop dates in addition to other pertinent events   . 4/15 BIB EMS after collapsing at gas station and losing consciousness. Large intraparenchymal hematoma.  Going to OR emergently. CCM consulted for expectant vent management  . 4/16 S/p craniotomy and drain. Repeat CT head with largely resolved mass effect and resolved midline shift, s/p hemorrhage evacuation with drain in place . 4/17 MRI with resolved midline shift. No mass or vascular lesion underlying hematoma . 4/18 extubated . 4/19 Doing well on RA, Global Aphasia, sedated on precedex    Interim History / Subjective:  Off precedex as of this morning due to bradycardia More awake     Objective   Blood pressure 130/86, pulse (!) 36, temperature 99.3 F (37.4 C), temperature source Axillary, resp. rate (!) 25, height 5\' 8"  (1.727 m), weight 72.6 kg, SpO2 99 %.       Intake/Output Summary (Last 24 hours) at 10/22/2020 0745 Last data filed at 10/22/2020 0700 Gross per 24 hour  Intake  1257.15 ml  Output 2057 ml  Net -799.85 ml   Filed Weights   10/17/20 1800 10/17/20 1825  Weight: 72.6 kg 72.6 kg   Physical Exam:  General- WDWN acutely ill appearing young adult F, reclined In bed NAD  HEENT: Craniotomy incision site clean dry well approximated staples intact no drainage bleeding or erythema. Anicteric sclera. Long false eyelashes. Pink dry mm.  Cardiac: RRR s1s2 no rgm cap refill brisk  Chest: Symmetrical chest expansion, CTAb even and unlabored on RA  Abd.: Soft ndnt + bowel soundsx4  Ext: No acute joint deformity no cyanosis or clubbing.  Neuro: Awake, disoriented. Expressive aphasia.RUE flaccid. Following commands BLE, LUE.  Skin: c/d/w no rash   Labs/imaging that I havepersonally reviewed  (right click and "Reselect all SmartList Selections" daily)  4/15 CT H> 5.6x4.4cm acute L frontoparietal intraparenchymal hematoma, decompressed into L ventricle which contains significant amount of blood, which extends into third ventricle as well. 1cm L to R midline shift. Effacement of basilar cisterns including suprasellar and ambient cisterns  4/16 CT head post-op with largely resolved mass effect and resolved midline shift, s/p hemorrhage evacuation with drain in place 4/17 MRI brain with resolved midline shift  4/20 CMP and CBC -- K 3.2 Hgb 7.6  Resolved Hospital Problem list    Acute hypoxic respiratory failure   Assessment & Plan:   Acute encephalopathy -IPH with subsequent aphasia, ? ICU delirium P -minimize sedating medications -delirium precautions  -when has enteral access, consider antipsychotic -off precedex 4/20 with bradycardia  IPH s/p emergent craniotomy  Large acute L frontoparietal intraparenchymal hematoma, decompressed into L ventricle, extending into third ventricle with associated L to R midline shift, with effacement of basilar cisterns. Post-op CT with evacuation of hemorrhage and largely resolved mass effect and midline shift. S/p  mannitol. Suspect hypertensive bleed   P -post op per NSGY, Neuro  -SBP goal <140 -hold chemical vte ppx  -secondary htn workup once stabilized  COVID-19 positive, asymptomatic  P -airborne/contact  -will defer covid related therapies as no hx of pt having symptoms, and cxr without infiltrates  Hypokalemia Hypophosphatemia P -Replete and trend   Urinary retention P -Urecholine ordered (has no enteral access however so not yet given) -foley for now   Inadequate PO intake  Plan -cortrak if does not pass swallow 4/20   Best practice (right click and "Reselect all SmartList Selections" daily)  Diet:  SLP pending. If does not pass, cortrak 4/20  Pain/Anxiety/Delirium protocol (if indicated): No VAP protocol (if indicated): Not indicated DVT prophylaxis: SCD GI prophylaxis: PPI Glucose control:  SSI No Central venous access:  Yes, and it is still needed Arterial line:  N/A Foley:  Yes, and it is still needed Mobility:  bed rest -- PT/OT PT consulted: Yes Last date of multidisciplinary goals of care discussion- per primary  Code Status:  full code Disposition: ICU   Critical care time    CRITICAL CARE Performed by: Lanier Clam   Total critical care time: 43 minutes  Critical care time was exclusive of separately billable procedures and treating other patients. Critical care was necessary to treat or prevent imminent or life-threatening deterioration.  Critical care was time spent personally by me on the following activities: development of treatment plan with patient and/or surrogate as well as nursing, discussions with consultants, evaluation of patient's response to treatment, examination of patient, obtaining history from patient or surrogate, ordering and performing treatments and interventions, ordering and review of laboratory studies, ordering and review of radiographic studies, pulse oximetry and re-evaluation of patient's condition.  Tessie Fass MSN,  AGACNP-BC [place] [facility] [phone] If no answer, [phone] 10/22/2020, 7:45 AM

## 2020-10-22 NOTE — Evaluation (Signed)
Speech Language Pathology Evaluation Patient Details Name: Jane Martin MRN: [id number] DOB: May 01, 1996 Today's Date: 10/22/2020 Time: 0093-8182 SLP Time Calculation (min) (ACUTE ONLY): 15 min  Problem List:  Patient Active Problem List   Diagnosis Date Noted  . Encephalopathy acute   . ICH (intracerebral hemorrhage) (HCC) 10/17/2020   Past Medical History: No past medical history on file. Past Surgical History:  Past Surgical History:  Procedure Laterality Date  . CRANIOTOMY Left 10/17/2020   Procedure: CRANIOTOMY HEMATOMA Evacuation;  Surgeon: Barnett Abu, MD;  Location: [facility] OR;  Service: Neurosurgery;  Laterality: Left;   HPI:  25 yo female presents to [name] on 4/15 with collapse at gas station, followed by unresponsiveness. CTH show 5.6 cm in long axis acute left frontoparietal intraparenchymal hematoma, decompressing into the left lateral ventricle which contains a substantial amount of acute blood products which extend  into the third ventricle; 1.0 cm of left-to-right midline shift with effacement of the basilar cisterns. CTA head shows no aneurysm, LVO, or AVM. s/p L parietal crani with evacuation of intracerebral hemorrhage and placement of intraventricular catheter on 4/15. ETT 4/15-4/18. No pertinent PMH.   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  Expression is c/b normal syntactic structure, but prevalence of semantic and phonemic paraphasias.  Pt verbalizes difficulty comprehending task instructions, starting a question with "why" followed by paraphasias.  With max verbal cues and modeling, she was able to recite DOW, MOY, and count to 12 with notable perseverations within set and difficulty shifting set, as well as ongoing paraphasias.  Pt will benefit from intensive aphasia  therapy here and in next venue of care.  SLP will follow for speech/language and swallowing.    SLP Assessment  SLP Recommendation/Assessment: Patient needs continued Speech Lanaguage Pathology Services SLP Visit Diagnosis: Aphasia (R47.01)    Follow Up Recommendations  Inpatient Rehab    Frequency and Duration min 2x/week  2 weeks      SLP Evaluation Cognition  Overall Cognitive Status: Impaired/Different from baseline Arousal/Alertness: Lethargic Orientation Level: Disoriented X4       Comprehension  Auditory Comprehension Overall Auditory Comprehension: Impaired Yes/No Questions: Impaired Basic Biographical Questions: 0-25% accurate Commands: Impaired One Step Basic Commands: 0-24% accurate Conversation: Simple (impaired) Visual Recognition/Discrimination Discrimination: Exceptions to [facility] Common Objects: Unable to indentify Reading Comprehension Reading Status: Not tested    Expression Expression Primary Mode of Expression: Verbal Verbal Expression Overall Verbal Expression: Impaired Initiation: No impairment Automatic Speech: Name;Counting;Day of week Level of Generative/Spontaneous Verbalization: Sentence Repetition: Impaired Level of Impairment: Word level Naming: Impairment Responsive: 0-25% accurate Confrontation: Impaired Common Objects: Unable to indentify Convergent: Not tested Verbal Errors: Semantic paraphasias;Phonemic paraphasias;Perseveration Written Expression Dominant Hand: Right Written Expression: Not tested   Oral / Motor  Motor Speech Overall Motor Speech: Appears within functional limits for tasks assessed   GO                    Jane Martin 10/22/2020, 10:10 AM  Jane Martin Frederic, MA CCC/SLP Acute Rehabilitation Services Office number [phone] Pager [phone]

## 2020-10-22 NOTE — Progress Notes (Incomplete Revision)
STROKE TEAM PROGRESS NOTE   SUBJECTIVE (INTERVAL HISTORY) Patient is mildly somnolent, sitting up in bed off sedation (precedex off). Focusing on and attentive to examiners. Mimics more than follows commands. Remains with clear speech spoken in fluent sentences but with paraphasias. When given simpler answering options not  able to indicate answers with no/yes structure or head nod/shake or indicating by number. She is more consistent with movement of her left hemibody. She spontaneously moves all extremities x4 but R hand without fine motor efforts.   OBJECTIVE Vitals:   10/22/20 0500 10/22/20 0600 10/22/20 0700 10/22/20 0730  BP: 120/75 131/82 130/86   Pulse: (!) 53 (!) 30 (!) 36 (!) 36  Resp: (!) 22 (!) 23 (!) 25 (!) 25  Temp:      TempSrc:      SpO2: 96% 98% 98% 99%  Weight:      Height:       CBC:  Recent Labs  Lab 10/17/20 1740 10/17/20 1749 10/21/20 0520 10/22/20 0505  WBC 6.3   < > 6.8 6.3  NEUTROABS 1.8  --   --   --   HGB 13.8   < > 7.1* 7.6*  HCT 41.7   < > 22.1* 23.3*  MCV 93.3   < > 95.7 94.0  PLT 250   < > 128* 162   < > = values in this interval not displayed.   Basic Metabolic Panel:  Recent Labs  Lab 10/20/20 1800 10/20/20 1820 10/21/20 0520 10/22/20 0505  NA  --    < > 148* 147*  K  --   --  3.3* 3.2*  CL  --   --  118* 116*  CO2  --   --  25 22  GLUCOSE  --   --  122* 93  BUN  --   --  <5* 5*  CREATININE  --   --  0.57 0.58  CALCIUM  --   --  8.7* 8.9  MG 2.0  --  2.0 1.8  PHOS 3.8  --  3.1  --    < > = values in this interval not displayed.   Lipid Panel:  Recent Labs  Lab 10/18/20 0427 10/18/20 0428  CHOL 108  --   TRIG 29 29  HDL 35*  --   CHOLHDL 3.1  --   VLDL 6  --   LDLCALC 67  --    HgbA1c:  Lab Results  Component Value Date   HGBA1C 5.6 10/18/2020   Urine Drug Screen:     Component Value Date/Time   LABOPIA NONE DETECTED 10/18/2020 0030   COCAINSCRNUR NONE DETECTED 10/18/2020 0030   LABBENZ NONE DETECTED 10/18/2020  0030   AMPHETMU NONE DETECTED 10/18/2020 0030   THCU POSITIVE (A) 10/18/2020 0030   LABBARB NONE DETECTED 10/18/2020 0030    Alcohol Level     Component Value Date/Time   ETH <10 10/17/2020 1740   IMAGING CT head 4/15 left frontoparietal intraparenchymal hematoma, with IVH. 1.0 cm of left-to-right midline shift with effacement of the basilar cisterns  CTA Head and neck  4/15 Unremarkable, no AVM or aneurysm  Brain MRI 4/17 No mass or definite vascular lesion underlying the evacuated left parietal hematoma. There is accentuated intravascular enhancement along the superficial cavity which may be reactive; catheter angiography would be contributory. Resolved midline shift. No hydrocephalus  MRV 4/17 No evidence for venous thrombosis    Repeat CT head 4/19 No recurrent hemorrhage. Decreased brain swelling  and midline shift. No hydrocephalus.  2D Echo 1. Left ventricular ejection fraction, by estimation, is 55 to 60%. The  left ventricle has normal function. The left ventricle has no regional  wall motion abnormalities. Left ventricular diastolic function could not  be evaluated.  2. Right ventricular systolic function is normal. The right ventricular  size is normal.  3. The mitral valve is normal in structure. No evidence of mitral valve  regurgitation.  4. The aortic valve is normal in structure. Aortic valve regurgitation is not visualized.   PHYSICAL EXAM GENERAL: Young female sitting up in bed, calm and cooperative off sedation.  HEENT: - Normocephalic and atraumatic, dry mm, EVD present, NG tube, and ETT LUNGS - Clear to auscultation bilaterally with no wheezes CV - S1S2 RRR, no m/r/g, equal pulses bilaterally. ABDOMEN - Soft, nontender, nondistended with normoactive BS  NEURO:  Mental Status: sedated, Does not open eyes to stimulation. Briefly resists eye opening for exam. No response to tactile or noxious stimuli.  Language: No verbal response at present Cranial  Nerves  Pupils Right pupil 2 and sluggish, left pupil 2 and sluggish.  EOMI visual fields no blink to threat bliaterally,  bilateral corneals  facial asymmetry unable to assess, right periorbital edema is improving Motor: Responds to noxious stimuli, moves left arm purposeful Tone: is normal and bulk is normal Sensation- right abnormal  Coordination: unable to assess Gait- deferred  ASSESSMENT/Martin Jane Martin is a 25 y.o. female with no history, presented to ED by EMS after collapsing at a gas station. It was told that she yelled out for help and for someone to call 911, then collapsed to the ground. When EMS arrived, she was unresponsive which worsened to comatose. Intubated in ED.    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 Etiology unknown.  Repeat HCT stable 4/19  On Keppra   VTE prophylaxis on hold per CCM  No antithrombotic prior to admission, now on No antithrombotic  Ongoing aggressive stroke risk factor management  Therapy recommendations:  Pending  Disposition:  TBD  Consider transfer out of ICU tomorrow  Cerebral edema  CT and MRI post hematoma evaluation showed resolved midline shift  3% saline weaned to off   Na 146-153-148->149->147  ? Abnormal brain tissue  Per Dr. Danielle Dess path report showed normal brain tissue.   MRI - there is accentuated intravascular enhancement along the superficial cavity which may be reactive; catheter angiography may be considered when patient is more stable.  Will consider cerebral angiogram once stable.   Respiratory failure  Extubated 4/18, CCM monitoring airway protection due to AMS  CCM on board  Extubated 10/20/20  Hypertension . BP goal < 160 . SBP low normal to high 90s on cuff pressure  . Cleviprex PRN for volume control . Off A line . Dr. Merrily Pew considering secondary HTN work up when more stable   .  Asymptomatic bradycardia  Seems to worsen with precedex, precedex off, now resolved  Agitation  Precedex discontinued this morning  PRN clonazepam, will try to avoid IV sedation.   LUE restraint: pulling at lines and tubes  Birth control  Mom stated that pt has a hormone depo for birth control  Not sure the name  Concerning both estrogen and progesterone containing  Pt not smoker but vaping - mom not sure if nicotine containing  MRV showed no evidence for cerebral sinus venous thrombosis.  COVID positive  Asymptomatic  Mom does not know any potential exposure  On isolation through 4/25 (10 days)  Acute blood loss anemia  Hgb 9.6->8.8->8.1->7.0->7.6  Monitor  CCM on board  Dysphagia, Feeding/Nutrition  Cortrak tube placed and pulled out by patient overnight 4/19, Failed swallow this am. Martin to replace cortrak today when team is available  TF Osmolite per protocol  Dietitian on board  Urinary Retention  Foley placed 4/18  On urecholine, voiding trial planned 4/20  Other Stroke Risk Factors  Vaping - ? Nicotine or non-nicotine (mom not sure)   Family hx stroke (Mom had a stroke)  Hypokalemia Repleted  Monitor CCM on board   Other Active Problems    Hospital day # 5  This Martin of care was directed by Dr. Roda Shutters.  Shon Hale, NP-C  ATTENDING NOTE: I reviewed above note and agree with the assessment and Martin. Pt was seen and examined.   ***  On exam, mom and RN are at the bedside, pt awake, alert, eyes open, able to tell me her name, and her phone number, no significant dysarthria. However, she answer questions with random words, and not able to form meaningful sentences. Not able to following simple commands yet. Moving eyes bilaterally but left gaze preference, PERRL. Right mild facial droop. Tongue protrusion not cooperative. LUE 5/5, no drift. RUE flaccid today with slight withdraw on pain. LLE at least 4/5, no drift. RLE 3/5  with drift. Sensation, coordination not cooperative and gait not tested.   For detailed assessment and Martin, please refer to above as I have made changes wherever appropriate.   Jane Plan, MD PhD Stroke Neurology 10/22/2020 1:57 PM     ATTENDING NOTE: To contact Stroke Continuity provider, please refer to [email or web address]. After hours, contact General Neurology

## 2020-10-22 NOTE — Procedures (Signed)
Cortrak  Person Inserting Tube:  Jorel Gravlin C, RD Tube Type:  Cortrak - 43 inches Tube Location:  Left nare Initial Placement:  Stomach Secured by: Bridle Technique Used to Measure Tube Placement:  Documented cm marking at nare/ corner of mouth Cortrak Secured At:  60 cm    Cortrak Tube Team Note:  Consult received to place a Cortrak feeding tube.   No x-ray is required. RN may begin using tube.   If the tube becomes dislodged please keep the tube and contact the Cortrak team at [email or web address] ([email or web address]) for replacement.  If after hours and replacement cannot be delayed, place a NG tube and confirm placement with an abdominal x-ray.    Yuvin Bussiere P., RD, LDN, CNSC See AMiON for contact information    

## 2020-10-22 NOTE — Progress Notes (Addendum)
STROKE TEAM PROGRESS NOTE   SUBJECTIVE (INTERVAL HISTORY) Patient is mildly somnolent, sitting up in bed off sedation (precedex off). Focusing on and attentive to examiners. Mimics more than follows commands. Remains with clear speech spoken in fluent sentences but with paraphasias. When given simpler answering options not  able to indicate answers with no/yes structure or head nod/shake or indicating by number. She is more consistent with movement of her left hemibody. She spontaneously moves all extremities x4 but R hand without fine motor efforts.   OBJECTIVE Vitals:   10/22/20 0500 10/22/20 0600 10/22/20 0700 10/22/20 0730  BP: 120/75 131/82 130/86   Pulse: (!) 53 (!) 30 (!) 36 (!) 36  Resp: (!) 22 (!) 23 (!) 25 (!) 25  Temp:      TempSrc:      SpO2: 96% 98% 98% 99%  Weight:      Height:       CBC:  Recent Labs  Lab 10/17/20 1740 10/17/20 1749 10/21/20 0520 10/22/20 0505  WBC 6.3   < > 6.8 6.3  NEUTROABS 1.8  --   --   --   HGB 13.8   < > 7.1* 7.6*  HCT 41.7   < > 22.1* 23.3*  MCV 93.3   < > 95.7 94.0  PLT 250   < > 128* 162   < > = values in this interval not displayed.   Basic Metabolic Panel:  Recent Labs  Lab 10/20/20 1800 10/20/20 1820 10/21/20 0520 10/22/20 0505  NA  --    < > 148* 147*  K  --   --  3.3* 3.2*  CL  --   --  118* 116*  CO2  --   --  25 22  GLUCOSE  --   --  122* 93  BUN  --   --  <5* 5*  CREATININE  --   --  0.57 0.58  CALCIUM  --   --  8.7* 8.9  MG 2.0  --  2.0 1.8  PHOS 3.8  --  3.1  --    < > = values in this interval not displayed.   Lipid Panel:  Recent Labs  Lab 10/18/20 0427 10/18/20 0428  CHOL 108  --   TRIG 29 29  HDL 35*  --   CHOLHDL 3.1  --   VLDL 6  --   LDLCALC 67  --    HgbA1c:  Lab Results  Component Value Date   HGBA1C 5.6 10/18/2020   Urine Drug Screen:     Component Value Date/Time   LABOPIA NONE DETECTED 10/18/2020 0030   COCAINSCRNUR NONE DETECTED 10/18/2020 0030   LABBENZ NONE DETECTED 10/18/2020  0030   AMPHETMU NONE DETECTED 10/18/2020 0030   THCU POSITIVE (A) 10/18/2020 0030   LABBARB NONE DETECTED 10/18/2020 0030    Alcohol Level     Component Value Date/Time   ETH <10 10/17/2020 1740   IMAGING CT head 4/15 left frontoparietal intraparenchymal hematoma, with IVH. 1.0 cm of left-to-right midline shift with effacement of the basilar cisterns  CTA Head and neck  4/15 Unremarkable, no AVM or aneurysm  Brain MRI 4/17 No mass or definite vascular lesion underlying the evacuated left parietal hematoma. There is accentuated intravascular enhancement along the superficial cavity which may be reactive; catheter angiography would be contributory. Resolved midline shift. No hydrocephalus  MRV 4/17 No evidence for venous thrombosis    Repeat CT head 4/19 No recurrent hemorrhage. Decreased brain swelling  and midline shift. No hydrocephalus.  2D Echo 1. Left ventricular ejection fraction, by estimation, is 55 to 60%. The  left ventricle has normal function. The left ventricle has no regional  wall motion abnormalities. Left ventricular diastolic function could not  be evaluated.  2. Right ventricular systolic function is normal. The right ventricular  size is normal.  3. The mitral valve is normal in structure. No evidence of mitral valve  regurgitation.  4. The aortic valve is normal in structure. Aortic valve regurgitation is not visualized.   PHYSICAL EXAM GENERAL: Young female sitting up in bed, calm and cooperative off sedation.  HEENT: - Normocephalic and atraumatic, dry mm, EVD present, NG tube, and ETT LUNGS - Clear to auscultation bilaterally with no wheezes CV - S1S2 RRR, no m/r/g, equal pulses bilaterally. ABDOMEN - Soft, nontender, nondistended with normoactive BS  NEURO:  Mental Status: sedated, Does not open eyes to stimulation. Briefly resists eye opening for exam. No response to tactile or noxious stimuli.  Language: No verbal response at present Cranial  Nerves  Pupils Right pupil 2 and sluggish, left pupil 2 and sluggish.  EOMI visual fields no blink to threat bliaterally,  bilateral corneals  facial asymmetry unable to assess, right periorbital edema is improving Motor: Responds to noxious stimuli, moves left arm purposeful Tone: is normal and bulk is normal Sensation- right abnormal  Coordination: unable to assess Gait- deferred  ASSESSMENT/PLAN Ms. KRISTINE CHAHAL is a 25 y.o. female with no history, presented to ED by EMS after collapsing at a gas station. It was told that she yelled out for help and for someone to call 911, then collapsed to the ground. When EMS arrived, she was unresponsive which worsened to comatose. Intubated in ED.    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 Etiology unknown.  Repeat HCT stable 4/19  On Keppra   VTE prophylaxis on hold per CCM  No antithrombotic prior to admission, now on No antithrombotic  Ongoing aggressive stroke risk factor management  Therapy recommendations:  Pending  Disposition:  TBD  Consider transfer out of ICU tomorrow  Cerebral edema  CT and MRI post hematoma evaluation showed resolved midline shift  3% saline weaned to off   Na 146-153-148->149->147  ? Abnormal brain tissue  Per Dr. Danielle Dess path report showed normal brain tissue.   MRI - there is accentuated intravascular enhancement along the superficial cavity which may be reactive; catheter angiography may be considered when patient is more stable.  Will consider cerebral angiogram once stable.   Respiratory failure  Extubated 4/18, CCM monitoring airway protection due to AMS  CCM on board  Extubated 10/20/20  Hypertension . BP goal < 160 . SBP low normal to high 90s on cuff pressure  . Cleviprex PRN for volume control . Off A line . Dr. Merrily Pew considering secondary HTN work up when more stable   .  Asymptomatic bradycardia  Seems to worsen with precedex, precedex off, now resolved  Agitation  Precedex discontinued this morning  PRN clonazepam, will try to avoid IV sedation.   LUE restraint: pulling at lines and tubes  Birth control  Mom stated that pt has a hormone depo for birth control  Not sure the name  Concerning both estrogen and progesterone containing  Pt not smoker but vaping - mom not sure if nicotine containing  MRV showed no evidence for cerebral sinus venous thrombosis.  COVID positive  Asymptomatic  Mom does not know any potential exposure  On isolation through 4/25 (10 days)  Acute blood loss anemia  Hgb 9.6->8.8->8.1->7.0->7.6  Monitor  CCM on board  Dysphagia, Feeding/Nutrition  Cortrak tube placed and pulled out by patient overnight 4/19, Failed swallow this am. Plan to replace cortrak today when team is available  TF Osmolite per protocol  Dietitian on board  Urinary Retention  Foley placed 4/18  On urecholine, voiding trial planned 4/20  Other Stroke Risk Factors  Vaping - ? Nicotine or non-nicotine (mom not sure)   Family hx stroke (Mom had a stroke)  Hypokalemia Repleted  Monitor CCM on board   Other Active Problems    Hospital day # 5  This plan of care was directed by Dr. Roda Shutters.  Shon Hale, NP-C  ATTENDING NOTE: I reviewed above note and agree with the assessment and plan. Pt was seen and examined.   Mom and RN are at the bedside, pt awake, alert, eyes open, able to tell me her name, and her phone number, no significant dysarthria. However, she answer questions with random words, and not able to form meaningful sentences. Not able to following simple commands yet. Moving eyes bilaterally but left gaze preference, PERRL. Right mild facial droop. Tongue protrusion not cooperative. LUE 5/5, no drift. RUE flaccid today with slight withdraw on pain. LLE at least 4/5, no drift. RLE 3/5 with drift.  Sensation, coordination not cooperative and gait not tested.  3% saline has been discontinued.  Patient has bradycardia again this morning after Precedex infusion, discussed with CCM Dr. Merrily Pew, will DC Precedex.  Dr. Danielle Dess removed through NG tube yesterday, patient doing well.  She did not pass swallow, currently on core track with tube feeding and p.o. medications.  We will start Klonopin 0.5 twice daily as needed for agitation.  We will plan for cerebral angiogram once more stabilized.  For detailed assessment and plan, please refer to above as I have made changes wherever appropriate.   This patient is critically ill due to large left ICH and IVH, status post hematoma evacuation and at significant risk of neurological worsening, death form cerebral edema, brain herniation, heart failure. This patient's care requires constant monitoring of vital signs, hemodynamics, respiratory and cardiac monitoring, review of multiple databases, neurological assessment, discussion with family, other specialists and medical decision making of high complexity. I spent 45 minutes of neurocritical care time in the care of this patient. I had long discussion with mom at bedside, updated pt current condition, treatment plan and potential prognosis, and answered all the questions.  She expressed understanding and appreciation.    Marvel Plan, MD PhD Stroke Neurology 10/22/2020 1:57 PM     ATTENDING NOTE: To contact Stroke Continuity provider, please refer to [email or web address]. After hours, contact General Neurology

## 2020-10-22 NOTE — Progress Notes (Signed)
   10/22/20 0730  Vitals  Pulse Rate (!) 36  ECG Heart Rate (!) 48  Resp (!) 25  Oxygen Therapy  SpO2 99 %  MEWS Score  MEWS Temp 0  MEWS Systolic 0  MEWS Pulse 1  MEWS RR 1  MEWS LOC 1  MEWS Score 3  MEWS Score Color Yellow  Provider Notification  Provider Name/Title Dr Jerel Shepherd  Date Provider Notified 10/22/20  Time Provider Notified 0730  Notification Type Page  Notification Reason Change in status  Provider response En route;See new orders  Date of Provider Response 10/22/20  Time of Provider Response [phone]

## 2020-10-22 NOTE — Progress Notes (Signed)
  Speech Language Pathology Treatment: Dysphagia  Patient Details Name: NAZIA RHINES MRN: [id number] DOB: 1996-04-10 Today's Date: 10/22/2020 Time: 0910-0920 SLP Time Calculation (min) (ACUTE ONLY): 10 min  Assessment / Plan / Recommendation Clinical Impression  F/u to address dysphagia.  Deniah is calm and more interactive this am, but still with waxing and waning alertness, which is impeding ability to take enough POs .  Follows occasional simple commands intermittently. With assist, she held a cup of water in left hand and drank from a straw - needed initial cues to initiate motor pattern, then began drinking with no s/s of aspiration.  When spoon with applesauce was placed against lips, she showed no reaction, no attempt to remove material.  Repeated attempts brought similar absence of response.  Recommend continuing to offer thin liquids - doubt pt can sustain wakefulness in order to consistently take meds and meet her nutritional needs.  Plan now is to replace cortrak - hopefully LOA will improve in the next few days. SLP will follow. D/W RN.   HPI HPI: 25 yo female presents to [facility] on 4/15 with collapse at gas station, followed by unresponsiveness. CTH show 5.6 cm in long axis acute left frontoparietal intraparenchymal hematoma, decompressing into the left lateral ventricle which contains a substantial amount of acute blood products which extend  into the third ventricle; 1.0 cm of left-to-right midline shift with effacement of the basilar cisterns. CTA head shows no aneurysm, LVO, or AVM. s/p L parietal crani with evacuation of intracerebral hemorrhage and placement of intraventricular catheter on 4/15. ETT 4/15-4/18. No pertinent PMH.      SLP Plan  Continue with current plan of care       Recommendations  Diet recommendations: Thin liquid Liquids provided via: Straw Medication Administration: Via alternative means Supervision: Staff to assist with self feeding Compensations:  Minimize environmental distractions Postural Changes and/or Swallow Maneuvers: Seated upright 90 degrees                Oral Care Recommendations: Oral care BID Follow up Recommendations: Inpatient Rehab SLP Visit Diagnosis: Dysphagia, oropharyngeal phase (R13.12) Plan: Continue with current plan of care       GO                [name] 10/22/2020, 9:54 AM  [name] Frederic, MA CCC/SLP Acute Rehabilitation Services Office number [phone] Pager [phone]

## 2020-10-22 NOTE — Progress Notes (Signed)
Physical Therapy Treatment Patient Details Name: Jane Martin MRN: [id number] DOB: 1996/03/14 Today's Date: 10/22/2020    History of Present Illness 25 yo female presents to [facility] on 4/15 with collapse at gas station, followed by unresponsiveness. CTH show 5.6 cm in long axis acute left frontoparietal intraparenchymal hematoma, decompressing into the left lateral ventricle which contains a substantial amount of acute blood products which extend  into the third ventricle; 1.0 cm of left-to-right midline shift with effacement of the basilar cisterns. CTA head shows no aneurysm, LVO, or AVM. s/p L parietal crani with evacuation of intracerebral hemorrhage and placement of intraventricular catheter on 4/15. ETT 4/15-4/18. Cortrak placed 4/20. No pertinent PMH.    PT Comments    Pt making good progress towards her goals this date as she was following increased commands and not agitated or aggressive this date. However, she continues to be easily distracted, lethargic, and display STM deficits with perseverative thoughts. Pt needed TAx2 to roll in bed for pad placement to use the maxisky to lift her to the chair. From the chair, she required maxA to come to stand x2 short bouts with L UE support and bil knee block, +2 for safety with managing lines. Pt's HR would increase to 140s quickly when coming to stand, thus returned pt to sit, RN aware. Spontaneous R leg movement noted, with intermittent moments of following cues with R leg. No R UE movement noted during session. Will continue to follow acutely. Current recommendations remain appropriate.    Follow Up Recommendations  CIR     Equipment Recommendations  Other (comment) (TBD)    Recommendations for Other Services       Precautions / Restrictions Precautions Precautions: Fall Precaution Comments: R inattention; L wrist restraint; Cortrak; A-line Restrictions Weight Bearing Restrictions: No    Mobility  Bed Mobility Overal bed  mobility: Needs Assistance Bed Mobility: Rolling Rolling: Total assist;+2 for physical assistance;+2 for safety/equipment         General bed mobility comments: total +2 for all aspects, limited by poor command following. Cued pt to flex R knee, but poor initiation until tactile cues and assist provided and then physical assistance to maintain.    Transfers Overall transfer level: Needs assistance Equipment used: 1 person hand held assist [facility]) Transfers: Sit to/from Stand (maxisky lift to chair) Sit to Stand: Max assist;+2 safety/equipment         General transfer comment: Pt dependently lifted from bed to chair with maxisky. Form recliner, performed x2 sit to stand reps with pt cued to hug therapist anterior to her while bil knees were blocked and physical assistance was provided under buttocks to extend hips, maxA to power up to stand and +2 for safety managing lines. Bil knee blocking noted, R > L. Short standing bouts as HR would increase to 140s quickly.  Ambulation/Gait             General Gait Details: Deferred due to poor command following and increase in HR with short standing bouts   Stairs             Wheelchair Mobility    Modified Rankin (Stroke Patients Only) Modified Rankin (Stroke Patients Only) Pre-Morbid Rankin Score: No symptoms Modified Rankin: Severe disability     Balance Overall balance assessment: Needs assistance         Standing balance support: Single extremity supported Standing balance-Leahy Scale: Poor Standing balance comment: L UE wrapped around therapist anterior to pt with bil knees blocked,  maxA.                            Cognition Arousal/Alertness: Lethargic Behavior During Therapy: Anxious;Flat affect;Impulsive Overall Cognitive Status: Impaired/Different from baseline Area of Impairment: Attention;Memory;Following commands;Safety/judgement;Awareness;Problem solving                    Current Attention Level: Alternating Memory: Decreased short-term memory Following Commands: Follows one step commands inconsistently;Follows one step commands with increased time Safety/Judgement: Decreased awareness of safety;Decreased awareness of deficits Awareness: Intellectual Problem Solving: Slow processing;Decreased initiation;Difficulty sequencing;Requires verbal cues;Requires tactile cues General Comments: Pt following ~35% of simple cues with increased time to process and respond. Pt anxious at times, perseverating on wanting her mom nearby. Poor STM as mom reconfirmed her location in room several times. Pt with no awareness into the situation. Pt wavered from moments of short conversation to lethargy.      Exercises      General Comments General comments (skin integrity, edema, etc.): HR 110-120s at rest but up to 130-140s with standing, thus returned to sit until HR decreased again, RN notified      Pertinent Vitals/Pain Pain Assessment: Faces Faces Pain Scale: Hurts a little bit Pain Location: generalized discomfort with mobility Pain Descriptors / Indicators: Discomfort;Grimacing Pain Intervention(s): Limited activity within patient's tolerance;Monitored during session;Repositioned    Home Living                      Prior Function            PT Goals (current goals can now be found in the care plan section) Acute Rehab PT Goals Patient Stated Goal: to have her mom PT Goal Formulation: With patient/family Time For Goal Achievement: 11/04/20 Potential to Achieve Goals: Fair Progress towards PT goals: Progressing toward goals    Frequency    Min 4X/week      PT Plan Current plan remains appropriate    Co-evaluation              AM-PAC PT "6 Clicks" Mobility   Outcome Measure  Help needed turning from your back to your side while in a flat bed without using bedrails?: Total Help needed moving from lying on your back to sitting on the  side of a flat bed without using bedrails?: Total Help needed moving to and from a bed to a chair (including a wheelchair)?: Total Help needed standing up from a chair using your arms (e.g., wheelchair or bedside chair)?: A Lot Help needed to walk in hospital room?: Total Help needed climbing 3-5 steps with a railing? : Total 6 Click Score: 7    End of Session   Activity Tolerance: Patient tolerated treatment well;Patient limited by lethargy Patient left: in chair;with call bell/phone within reach;with chair alarm set;with family/visitor present;with restraints reapplied Nurse Communication: Mobility status;Other (comment) (HR) PT Visit Diagnosis: Other abnormalities of gait and mobility (R26.89);Difficulty in walking, not elsewhere classified (R26.2);Hemiplegia and hemiparesis;Unsteadiness on feet (R26.81);Muscle weakness (generalized) (M62.81);Other symptoms and signs involving the nervous system (R29.898) Hemiplegia - Right/Left: Right Hemiplegia - dominant/non-dominant: Dominant 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

## 2020-10-23 DIAGNOSIS — R531 Weakness: Secondary | ICD-10-CM | POA: Diagnosis not present

## 2020-10-23 DIAGNOSIS — G9340 Encephalopathy, unspecified: Secondary | ICD-10-CM | POA: Diagnosis not present

## 2020-10-23 DIAGNOSIS — R1312 Dysphagia, oropharyngeal phase: Secondary | ICD-10-CM | POA: Diagnosis not present

## 2020-10-23 DIAGNOSIS — W19XXXA Unspecified fall, initial encounter: Secondary | ICD-10-CM | POA: Diagnosis not present

## 2020-10-23 DIAGNOSIS — J9620 Acute and chronic respiratory failure, unspecified whether with hypoxia or hypercapnia: Secondary | ICD-10-CM | POA: Diagnosis not present

## 2020-10-23 DIAGNOSIS — G936 Cerebral edema: Secondary | ICD-10-CM | POA: Diagnosis not present

## 2020-10-23 DIAGNOSIS — I615 Nontraumatic intracerebral hemorrhage, intraventricular: Secondary | ICD-10-CM | POA: Diagnosis not present

## 2020-10-23 DIAGNOSIS — I611 Nontraumatic intracerebral hemorrhage in hemisphere, cortical: Secondary | ICD-10-CM | POA: Diagnosis not present

## 2020-10-23 DIAGNOSIS — D689 Coagulation defect, unspecified: Secondary | ICD-10-CM | POA: Diagnosis not present

## 2020-10-23 DIAGNOSIS — W19XXXD Unspecified fall, subsequent encounter: Secondary | ICD-10-CM | POA: Diagnosis not present

## 2020-10-23 DIAGNOSIS — I619 Nontraumatic intracerebral hemorrhage, unspecified: Secondary | ICD-10-CM | POA: Diagnosis not present

## 2020-10-23 DIAGNOSIS — I618 Other nontraumatic intracerebral hemorrhage: Secondary | ICD-10-CM | POA: Diagnosis not present

## 2020-10-23 DIAGNOSIS — E873 Alkalosis: Secondary | ICD-10-CM | POA: Diagnosis not present

## 2020-10-23 DIAGNOSIS — G935 Compression of brain: Secondary | ICD-10-CM | POA: Diagnosis not present

## 2020-10-23 DIAGNOSIS — I612 Nontraumatic intracerebral hemorrhage in hemisphere, unspecified: Secondary | ICD-10-CM | POA: Diagnosis not present

## 2020-10-23 DIAGNOSIS — Z9889 Other specified postprocedural states: Secondary | ICD-10-CM | POA: Diagnosis not present

## 2020-10-23 DIAGNOSIS — R4701 Aphasia: Secondary | ICD-10-CM | POA: Diagnosis not present

## 2020-10-23 DIAGNOSIS — U071 COVID-19: Secondary | ICD-10-CM | POA: Diagnosis not present

## 2020-10-23 DIAGNOSIS — J9601 Acute respiratory failure with hypoxia: Secondary | ICD-10-CM | POA: Diagnosis not present

## 2020-10-23 LAB — CBC
HCT: 23.7 % — ABNORMAL LOW (ref 36.0–46.0)
Hemoglobin: 7.9 g/dL — ABNORMAL LOW (ref 12.0–15.0)
MCH: 31 pg (ref 26.0–34.0)
MCHC: 33.3 g/dL (ref 30.0–36.0)
MCV: 92.9 fL (ref 80.0–100.0)
Platelets: 194 10*3/uL (ref 150–400)
RBC: 2.55 MIL/uL — ABNORMAL LOW (ref 3.87–5.11)
RDW: 12.1 % (ref 11.5–15.5)
WBC: 6.8 10*3/uL (ref 4.0–10.5)
nRBC: 0 % (ref 0.0–0.2)

## 2020-10-23 LAB — BASIC METABOLIC PANEL
Anion gap: 4 — ABNORMAL LOW (ref 5–15)
BUN: 6 mg/dL (ref 6–20)
CO2: 27 mmol/L (ref 22–32)
Calcium: 8.7 mg/dL — ABNORMAL LOW (ref 8.9–10.3)
Chloride: 110 mmol/L (ref 98–111)
Creatinine, Ser: 0.55 mg/dL (ref 0.44–1.00)
GFR, Estimated: >60 mL/min (ref >60)
Glucose, Bld: 150 mg/dL — ABNORMAL HIGH (ref 70–99)
Potassium: 3.3 mmol/L — ABNORMAL LOW (ref 3.5–5.1)
Sodium: 141 mmol/L (ref 135–145)

## 2020-10-23 LAB — GLUCOSE, CAPILLARY
Glucose-Capillary: 145 mg/dL — ABNORMAL HIGH (ref 70–99)
Glucose-Capillary: 139 mg/dL — ABNORMAL HIGH (ref 70–99)

## 2020-10-23 MED ORDER — POTASSIUM CHLORIDE 20 MEQ PO PACK
20.0000 meq | PACK | ORAL | Status: AC
  Administered 2020-10-23 (×2): 20 meq
  Filled 2020-10-23 (×2): qty 1

## 2020-10-23 MED ORDER — DOCUSATE SODIUM 50 MG/5ML PO LIQD
100.0000 mg | Freq: Two times a day (BID) | ORAL | Status: DC
  Administered 2020-10-23 – 2020-10-28 (×6): 100 mg
  Filled 2020-10-23 (×10): qty 10

## 2020-10-23 MED ORDER — MAGNESIUM SULFATE 2 GM/50ML IV SOLN
2.0000 g | Freq: Once | INTRAVENOUS | Status: AC
  Administered 2020-10-23: 2 g via INTRAVENOUS
  Filled 2020-10-23: qty 50

## 2020-10-23 MED ORDER — POTASSIUM CHLORIDE 10 MEQ/50ML IV SOLN
10.0000 meq | INTRAVENOUS | Status: AC
  Administered 2020-10-23 (×4): 10 meq via INTRAVENOUS
  Filled 2020-10-23 (×4): qty 50

## 2020-10-23 MED ORDER — BISACODYL 10 MG RE SUPP
10.0000 mg | Freq: Once | RECTAL | Status: AC
  Administered 2020-10-23: 10 mg via RECTAL
  Filled 2020-10-23: qty 1

## 2020-10-23 NOTE — Progress Notes (Addendum)
Nutrition Follow-up  DOCUMENTATION CODES:   Not applicable  INTERVENTION:   Tube feeding via Cortrak tube: Osmolite 1.5 at 55 ml/h (1320 ml per day) Prosource TF 45 ml TID  Provides 2100 kcal, 115 gm protein, 1003 ml free water daily  PO trials based on alertness   NUTRITION DIAGNOSIS:   Increased nutrient needs related to post-op healing as evidenced by estimated needs. Ongoing  GOAL:   Patient will meet greater than or equal to 90% of their needs Met with TF  MONITOR:   TF tolerance  REASON FOR ASSESSMENT:   Consult Enteral/tube feeding initiation and management  ASSESSMENT:   Pt with no PMH admitted after collapsing at gas station with ICH, left parietal region with intraventricular extension, herniation syndrome s/p craniotomy for evacuation. Per neuro etiology unknown.   Pt discussed during ICU rounds and with RN.  Pt aphasic and plegic on R side. Working with SLP. Continues to wax and wane therefore very little PO intake for now. Per MD word salad. Pt unable to answer any of my questions.  COVID +  4/18 extubated; cortrak placed tip gastric 4/19 EVD removed 4/20 cortrak replaced  Medications reviewed and include: colace, miralax, senokot  KCl x 4  Labs reviewed    Diet Order:   Diet Order            Diet full liquid Room service appropriate? Yes; Fluid consistency: Thin  Diet effective now                 EDUCATION NEEDS:   No education needs have been identified at this time  Skin:  Skin Assessment: Reviewed RN Assessment  Last BM:  4/21  Height:   Ht Readings from Last 1 Encounters:  10/18/20 '5\' 8"'  (1.727 m)    Weight:   Wt Readings from Last 1 Encounters:  10/17/20 72.6 kg    Ideal Body Weight:  63.6 kg  BMI:  Body mass index is 24.33 kg/m.  Estimated Nutritional Needs:   Kcal:  2000-2200  Protein:  95-115 grams  Fluid:  > 2 L/day  Lockie Pares., RD, LDN, CNSC See AMiON for contact information

## 2020-10-23 NOTE — Progress Notes (Signed)
STROKE TEAM PROGRESS NOTE   SUBJECTIVE (INTERVAL HISTORY) RN at the bedside, pt just finished bath. Still lethargic but open eyes on voice, able to tell me her name and follow midline commands, but still word salad and not able to repeat. Still has right hemiplegia. Off 3% saline. Consider angiogram tomorrow.   OBJECTIVE Vitals:   10/23/20 0600 10/23/20 0700 10/23/20 0800 10/23/20 0900  BP: 119/71 121/66 131/64 120/71  Pulse: 86 79 83 83  Resp: (!) 26 (!) 22 (!) 26 (!) 24  Temp:      TempSrc:      SpO2: 99% 100% 100% 99%  Weight:      Height:       CBC:  Recent Labs  Lab 10/17/20 1740 10/17/20 1749 10/22/20 0505 10/23/20 0500  WBC 6.3   < > 6.3 6.8  NEUTROABS 1.8  --   --   --   HGB 13.8   < > 7.6* 7.9*  HCT 41.7   < > 23.3* 23.7*  MCV 93.3   < > 94.0 92.9  PLT 250   < > 162 194   < > = values in this interval not displayed.   Basic Metabolic Panel:  Recent Labs  Lab 10/20/20 1800 10/20/20 1820 10/21/20 0520 10/22/20 0505 10/23/20 0500  NA  --    < > 148* 147* 141  K  --   --  3.3* 3.2* 3.3*  CL  --   --  118* 116* 110  CO2  --   --  25 22 27   GLUCOSE  --   --  122* 93 150*  BUN  --   --  <5* 5* 6  CREATININE  --   --  0.57 0.58 0.55  CALCIUM  --   --  8.7* 8.9 8.7*  MG 2.0  --  2.0 1.8  --   PHOS 3.8  --  3.1  --   --    < > = values in this interval not displayed.   Lipid Panel:  Recent Labs  Lab 10/18/20 0427 10/18/20 0428  CHOL 108  --   TRIG 29 29  HDL 35*  --   CHOLHDL 3.1  --   VLDL 6  --   LDLCALC 67  --    HgbA1c:  Lab Results  Component Value Date   HGBA1C 5.6 10/18/2020   Urine Drug Screen:     Component Value Date/Time   LABOPIA NONE DETECTED 10/18/2020 0030   COCAINSCRNUR NONE DETECTED 10/18/2020 0030   LABBENZ NONE DETECTED 10/18/2020 0030   AMPHETMU NONE DETECTED 10/18/2020 0030   THCU POSITIVE (A) 10/18/2020 0030   LABBARB NONE DETECTED 10/18/2020 0030    Alcohol Level     Component Value Date/Time   ETH <10 10/17/2020  1740   IMAGING CT head 4/15 left frontoparietal intraparenchymal hematoma, with IVH. 1.0 cm of left-to-right midline shift with effacement of the basilar cisterns  CTA Head and neck  4/15 Unremarkable, no AVM or aneurysm  Brain MRI 4/17 No mass or definite vascular lesion underlying the evacuated left parietal hematoma. There is accentuated intravascular enhancement along the superficial cavity which may be reactive; catheter angiography would be contributory. Resolved midline shift. No hydrocephalus  MRV 4/17 No evidence for venous thrombosis    Repeat CT head 4/19 No recurrent hemorrhage. Decreased brain swelling and midline shift. No hydrocephalus.  2D Echo 1. Left ventricular ejection fraction, by estimation, is 55 to 60%. The  left  ventricle has normal function. The left ventricle has no regional  wall motion abnormalities. Left ventricular diastolic function could not  be evaluated.  2. Right ventricular systolic function is normal. The right ventricular  size is normal.  3. The mitral valve is normal in structure. No evidence of mitral valve  regurgitation.  4. The aortic valve is normal in structure. Aortic valve regurgitation is not visualized.   PHYSICAL EXAM  Temp:  [99.1 F (37.3 C)-100.5 F (38.1 C)] 99.2 F (37.3 C) (04/21 0400) Pulse Rate:  [77-116] 83 (04/21 0900) Resp:  [17-34] 24 (04/21 0900) BP: (105-131)/(59-86) 120/71 (04/21 0900) SpO2:  [98 %-100 %] 99 % (04/21 0900)  General - Well nourished, well developed, in no apparent distress.  Ophthalmologic - fundi not visualized due to noncooperation.  Cardiovascular - Regular rhythm and rate.  Neuro - lethargic, eyes closed but open on voice briefly, able to tell me her name, moderate dysarthria. However, she answer other questions with random words (like "OK" "I am good" "music"), and not able to form meaningful sentences. Not able to following peripheral commands yet but able to follow midline  commands. Tracking bilaterally but left gaze preference, PERRL. Right mild facial droop. Tongue protrusion midline. LUE and LLE spontaneous movement against gravity. RUE still flaccid today and RLE mild withdraw to pain. Sensation, coordination not cooperative and gait not tested.   ASSESSMENT/PLAN [name] is a 25 y.o. female with no history, presented to ED by EMS after collapsing at a gas station. It was told that she yelled out for help and for someone to call 911, then collapsed to the ground. When EMS arrived, she was unresponsive which worsened to comatose. Intubated in ED.    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 Etiology unknown.  Repeat HCT stable 4/19  On Keppra s/p craniotomy   VTE prophylaxis - heparin IV  No antithrombotic prior to admission, now on No antithrombotic  Therapy recommendations:  CIR  Disposition:  TBD  Cerebral edema  CT and MRI post hematoma evaluation showed resolved midline shift  3% saline weaned to off   Na 146-153-148->149->147->141  ? Abnormal brain tissue  Per Dr. Danielle Martin path report showed normal brain tissue.   Pathology negative so far  MRI - there is accentuated intravascular enhancement along the superficial cavity which may be reactive; catheter angiography may be considered when patient is more stable.  Will consider cerebral angiogram tomorrow.   Respiratory failure  Extubated 4/18, CCM monitoring airway protection due to AMS  CCM on board  Extubated 10/20/20  Tolerating well  ?? Hypertension . BP goal < 160 . BP stable  . Off A line .  Asymptomatic bradycardia  Seems to worsen with precedex, precedex off, now resolved  HR regular rate and rhythm now.  Agitation  Precedex discontinued   PRN clonazepam, will try to avoid IV sedation.   LUE restraint: pulling at lines and tubes  Birth control  Mom  stated that pt has a hormone depo for birth control  Not sure the name  Concerning both estrogen and progesterone containing  Pt not smoker but vaping - mom not sure if nicotine containing  MRV showed no evidence for cerebral sinus venous thrombosis.  COVID positive  Asymptomatic  Mom does not know any potential exposure  On isolation through 4/25 (10 days)  Acute blood loss anemia  Hgb 9.6->8.8->8.1->7.0->7.6->8.7  Monitor  CCM on board  Dysphagia, Feeding/Nutrition  Cortrak tube re-placed  On clear liquid diet but not eating much  TF Osmolite per protocol  Dietitian on board  Urinary Retention  Foley placed 4/18  On urecholine  Other Stroke Risk Factors  Vaping - ? Nicotine or non-nicotine (mom not sure)   Family hx stroke (Mom had a stroke)  Hypokalemia  Repleted   Monitor  CCM on board  Other Active Problems    Hospital day # 6   This patient is critically ill due to large left frontal ICH, IVH, s/p craniotomy and hematoma evacuation, dysphagia and at significant risk of neurological worsening, death form sepsis, cerebral edema, seizure and re-bleed. This patient's care requires constant monitoring of vital signs, hemodynamics, respiratory and cardiac monitoring, review of multiple databases, neurological assessment, discussion with family, other specialists and medical decision making of high complexity. I spent 40 minutes of neurocritical care time in the care of this patient.  Marvel Plan, MD PhD Stroke Neurology 10/23/2020 11:11 AM

## 2020-10-23 NOTE — Progress Notes (Addendum)
Pharmacy Electrolyte Replacement  Recent Labs:  Recent Labs    10/21/20 0520 10/22/20 0505 10/23/20 0500  K 3.3* 3.2* 3.3*  MG 2.0 1.8  --   PHOS 3.1  --   --   CREATININE 0.57 0.58 0.55    Low Critical Values (K </= 2.5, Phos </= 1, Mg </= 1) Present: None   Plan:  - K 3.3 - 20 mEq PT x 2 doses and KCl runs x 4 - Mg 1.8 on 4/20 - not replaced yet, give Mg 2g IV x 1 - F/u K and Mg w/ AM labs  Thank you for allowing pharmacy to be a part of this patient's care.  Georgina Pillion, PharmD, BCPS Clinical Pharmacist Clinical phone for 10/23/2020: [phone] 10/23/2020 9:17 AM   **Pharmacist phone directory can now be found on [email or web address] ([email or web address]).  Listed under [facility].

## 2020-10-23 NOTE — Progress Notes (Signed)
Patient a transfer from 4 NICU  Via bed and tele monitor SR  Pt alertt but  Orientation is questionable ,follow simple command but not consistently Air/borne /droplet  Precaution initiated . Pt made comfortable in bed  Initial assessment done . Fall and aspiration precaution in place.

## 2020-10-23 NOTE — Progress Notes (Signed)
Physical Therapy Treatment Patient Details Name: Jane Martin MRN: [id number] DOB: [birth year] Today's Date: 10/23/2020    History of Present Illness 25 yo female presents to [facility] on 4/15 with collapse at gas station, followed by unresponsiveness. CTH show 5.6 cm in long axis acute left frontoparietal intraparenchymal hematoma, decompressing into the left lateral ventricle which contains a substantial amount of acute blood products which extend  into the third ventricle; 1.0 cm of left-to-right midline shift with effacement of the basilar cisterns. CTA head shows no aneurysm, LVO, or AVM. s/p L parietal crani with evacuation of intracerebral hemorrhage and placement of intraventricular catheter on 4/15. ETT 4/15-4/18. Cortrak placed 4/20. No pertinent PMH.    PT Comments    Pt continuing to make progress towards her goals, rolling in bed with only minA and coming to sit EOB with modAx2. However, pt remains limited by her extreme lethargy, minimally opening eyes but following increased simple commands. Pt benefits from her name being stated prior to command to gain her attention. Pt with poor truncal motor control, losing balance in multiple directions sitting EOB, thereby needing physical assistance to keep her balance and safety. Pt also with poor motor planning to facilitate quad activation prior to stance phase with gait, focusing on weight shifting pt laterally at EOB with bil knees blocked and maxA. Will continue to follow acutely. Current recommendations remain appropriate.   Follow Up Recommendations  CIR     Equipment Recommendations  Other (comment) (TBD)    Recommendations for Other Services       Precautions / Restrictions Precautions Precautions: Fall Precaution Comments: R inattention; L wrist restraint; posey belt; Cortrak Restrictions Weight Bearing Restrictions: No    Mobility  Bed Mobility Overal bed mobility: Needs Assistance Bed Mobility: Rolling;Sidelying to  Sit;Sit to Sidelying Rolling: Min assist Sidelying to sit: Mod assist;+2 for safety/equipment;+2 for physical assistance     Sit to sidelying: Max assist;+2 for physical assistance;+2 for safety/equipment General bed mobility comments: Pt cued to roll either direction several times to perform pericare as pt had large diarrhea upon entry into room. Pt needing increased time with multi-modal simple cues to flex either leg and roll to contralateral side, minA. ModAx2 to manage trunk and legs to sit up and scoot to EOB. MaxAx2 to return to supine, cuing pt to descend onto L elbow.    Transfers Overall transfer level: Needs assistance Equipment used: 1 person hand held assist Transfers: Sit to/from Stand (maxisky lift to chair) Sit to Stand: Max assist;+2 safety/equipment         General transfer comment: From EOB, x1 sit to stand rep with pt cued to hug therapist anterior to her while bil knees were blocked and physical assistance was provided under buttocks to extend hips, maxA to power up to stand and +2 for safety managing lines and changing bed sheets under pt. Bil knee buckling noted, R > L.  Ambulation/Gait Ambulation/Gait assistance: Max assist;+2 safety/equipment   Assistive device: 1 person hand held assist       General Gait Details: Pre-gait training, shifting weight laterally while standing with L UE around therapist and bil knees blocked as both would buckle, R >L. Cued pt to extend knees but pt maintains R knee slightly flexed throughout. 1x ~2 min duration   Stairs             Wheelchair Mobility    Modified Rankin (Stroke Patients Only) Modified Rankin (Stroke Patients Only) Pre-Morbid Rankin Score: No symptoms Modified  Rankin: Severe disability     Balance Overall balance assessment: Needs assistance Sitting-balance support: Bilateral upper extremity supported;Feet supported Sitting balance-Leahy Scale: Poor Sitting balance - Comments: Multidirectional  LOB, LOB in direction of head movement. PT supporting pt trunk posteriorly, requiring min-mod assist   Standing balance support: Single extremity supported Standing balance-Leahy Scale: Poor Standing balance comment: L UE wrapped around therapist anterior to pt with bil knees blocked, maxA.                            Cognition Arousal/Alertness: Lethargic Behavior During Therapy: Flat affect;Impulsive Overall Cognitive Status: Impaired/Different from baseline Area of Impairment: Attention;Memory;Following commands;Safety/judgement;Awareness;Problem solving                   Current Attention Level: Divided Memory: Decreased short-term memory Following Commands: Follows one step commands inconsistently;Follows one step commands with increased time Safety/Judgement: Decreased awareness of safety;Decreased awareness of deficits Awareness: Intellectual Problem Solving: Slow processing;Decreased initiation;Difficulty sequencing;Requires verbal cues;Requires tactile cues General Comments: Pt following ~60% of simple cues with increased time to process and respond. Benefits from her name stated prior to command to gain her attention. Pt very lethargic, keeping eyes closed majority of time, but moving to command.      Exercises      General Comments        Pertinent Vitals/Pain Pain Assessment: Faces Faces Pain Scale: No hurt Pain Intervention(s): Monitored during session    Home Living                      Prior Function            PT Goals (current goals can now be found in the care plan section) Acute Rehab PT Goals Patient Stated Goal: did not state PT Goal Formulation: With patient/family Time For Goal Achievement: 11/04/20 Potential to Achieve Goals: Fair Progress towards PT goals: Progressing toward goals    Frequency    Min 4X/week      PT Plan Current plan remains appropriate    Co-evaluation              AM-PAC PT "6  Clicks" Mobility   Outcome Measure  Help needed turning from your back to your side while in a flat bed without using bedrails?: A Little Help needed moving from lying on your back to sitting on the side of a flat bed without using bedrails?: A Lot Help needed moving to and from a bed to a chair (including a wheelchair)?: Total Help needed standing up from a chair using your arms (e.g., wheelchair or bedside chair)?: A Lot Help needed to walk in hospital room?: Total Help needed climbing 3-5 steps with a railing? : Total 6 Click Score: 10    End of Session   Activity Tolerance: Patient tolerated treatment well;Patient limited by lethargy Patient left: with call bell/phone within reach;with restraints reapplied;in bed;with bed alarm set Nurse Communication: Mobility status;Other (comment) (diarrhea) PT Visit Diagnosis: Other abnormalities of gait and mobility (R26.89);Difficulty in walking, not elsewhere classified (R26.2);Hemiplegia and hemiparesis;Unsteadiness on feet (R26.81);Muscle weakness (generalized) (M62.81);Other symptoms and signs involving the nervous system (R29.898) Hemiplegia - Right/Left: Right Hemiplegia - dominant/non-dominant: Dominant Hemiplegia - caused by: Other cerebrovascular disease     Time: 1223-1258 PT Time Calculation (min) (ACUTE ONLY): 35 min  Charges:  $Therapeutic Activity: 23-37 mins  Raymond Gurney, PT, DPT Acute Rehabilitation Services  Pager: [phone] Office: [phone]    [name] 10/23/2020, 1:42 PM

## 2020-10-23 NOTE — Progress Notes (Signed)
  Speech Language Pathology Treatment: Dysphagia  Patient Details Name: Jane Martin MRN: [id number] DOB: 06/22/1996 Today's Date: 10/23/2020 Time: 4098-1191 SLP Time Calculation (min) (ACUTE ONLY): 19 min  Assessment / Plan / Recommendation Clinical Impression  Followed up for PO readiness. Pt remains largely drowsy requiring full cueing for PO trials. Pts mother at bedside. Pt able to syphoon thin liquids via straw with palpable swallows. Trialed puree and single bite of mechanical soft textures. Pt with decreased oral readiness, prolonged mastication, and delayed oral transit. No overt s/sx of aspiration exhibited with POs. Mentation continues to be variable for further diet advancement. Recommend full liquid diet with meds crushed in puree as mentation permits. Hold POs if mentation is poor. SLP to follow up.    HPI HPI: 25 yo female presents to [facility] on 4/15 with collapse at gas station, followed by unresponsiveness. CTH show 5.6 cm in long axis acute left frontoparietal intraparenchymal hematoma, decompressing into the left lateral ventricle which contains a substantial amount of acute blood products which extend  into the third ventricle; 1.0 cm of left-to-right midline shift with effacement of the basilar cisterns. CTA head shows no aneurysm, LVO, or AVM. s/p L parietal crani with evacuation of intracerebral hemorrhage and placement of intraventricular catheter on 4/15. ETT 4/15-4/18. No pertinent PMH.      SLP Plan  Continue with current plan of care       Recommendations  Diet recommendations: Thin liquid;Other(comment) (full liquid diet) Liquids provided via: Straw Supervision: Staff to assist with self feeding;Full supervision/cueing for compensatory strategies Compensations: Minimize environmental distractions;Slow rate;Small sips/bites Postural Changes and/or Swallow Maneuvers: Seated upright 90 degrees;Upright 30-60 min after meal                Plan: Continue with  current plan of care       GO                Ardyth Gal MA, CCC-SLP Acute Rehabilitation Services   10/23/2020, 2:24 PM

## 2020-10-23 NOTE — Progress Notes (Signed)
CSW received call from Fernando Salinas with [employer], about initiating any plans for discharge placement. CSW explained that patient is not yet ready for that at this time, but we will reach out with the plan when appropriate. Stephanie at [facility] can be reached at [phone].  Blenda Nicely, [place] Clinical Social Worker [phone]

## 2020-10-24 DIAGNOSIS — U071 COVID-19: Secondary | ICD-10-CM | POA: Diagnosis not present

## 2020-10-24 DIAGNOSIS — I615 Nontraumatic intracerebral hemorrhage, intraventricular: Secondary | ICD-10-CM | POA: Diagnosis not present

## 2020-10-24 DIAGNOSIS — I611 Nontraumatic intracerebral hemorrhage in hemisphere, cortical: Secondary | ICD-10-CM | POA: Diagnosis not present

## 2020-10-24 LAB — BASIC METABOLIC PANEL
Anion gap: 9 (ref 5–15)
BUN: 6 mg/dL (ref 6–20)
CO2: 24 mmol/L (ref 22–32)
Calcium: 9.4 mg/dL (ref 8.9–10.3)
Chloride: 105 mmol/L (ref 98–111)
Creatinine, Ser: 0.53 mg/dL (ref 0.44–1.00)
GFR, Estimated: >60 mL/min (ref >60)
Glucose, Bld: 143 mg/dL — ABNORMAL HIGH (ref 70–99)
Potassium: 3.9 mmol/L (ref 3.5–5.1)
Sodium: 138 mmol/L (ref 135–145)

## 2020-10-24 LAB — CBC
HCT: 28.7 % — ABNORMAL LOW (ref 36.0–46.0)
Hemoglobin: 9.4 g/dL — ABNORMAL LOW (ref 12.0–15.0)
MCH: 30.1 pg (ref 26.0–34.0)
MCHC: 32.8 g/dL (ref 30.0–36.0)
MCV: 92 fL (ref 80.0–100.0)
Platelets: 246 10*3/uL (ref 150–400)
RBC: 3.12 MIL/uL — ABNORMAL LOW (ref 3.87–5.11)
RDW: 12.2 % (ref 11.5–15.5)
WBC: 10.9 10*3/uL — ABNORMAL HIGH (ref 4.0–10.5)
nRBC: 0 % (ref 0.0–0.2)

## 2020-10-24 LAB — PHOSPHORUS: Phosphorus: 3.3 mg/dL (ref 2.5–4.6)

## 2020-10-24 LAB — GLUCOSE, CAPILLARY
Glucose-Capillary: 123 mg/dL — ABNORMAL HIGH (ref 70–99)
Glucose-Capillary: 125 mg/dL — ABNORMAL HIGH (ref 70–99)
Glucose-Capillary: 133 mg/dL — ABNORMAL HIGH (ref 70–99)
Glucose-Capillary: 135 mg/dL — ABNORMAL HIGH (ref 70–99)
Glucose-Capillary: 144 mg/dL — ABNORMAL HIGH (ref 70–99)
Glucose-Capillary: 156 mg/dL — ABNORMAL HIGH (ref 70–99)

## 2020-10-24 LAB — MAGNESIUM: Magnesium: 2.1 mg/dL (ref 1.7–2.4)

## 2020-10-24 MED ORDER — ALTEPLASE 2 MG IJ SOLR
2.0000 mg | Freq: Once | INTRAMUSCULAR | Status: AC
  Administered 2020-10-24: 2 mg
  Filled 2020-10-24 (×3): qty 2

## 2020-10-24 MED ORDER — ALTEPLASE 2 MG IJ SOLR
2.0000 mg | Freq: Once | INTRAMUSCULAR | Status: AC
  Administered 2020-10-24: 2 mg
  Filled 2020-10-24: qty 2

## 2020-10-24 NOTE — Progress Notes (Signed)
STROKE TEAM PROGRESS NOTE   SUBJECTIVE (INTERVAL HISTORY) No family is at the bedside, pt is sleeping but easily arousable and able to keep awake the whole encounter, her language and right sided weakness are getting better. Discussed with Dr. Corliss Skains, will do angiogram once off isolation for COVID.   OBJECTIVE Vitals:   10/24/20 0356 10/24/20 0532 10/24/20 0800 10/24/20 1207  BP: (!) 146/75  116/87 116/81  Pulse: 93  78 94  Resp: 18     Temp: 100.3 F (37.9 C) 99.9 F (37.7 C) 99.7 F (37.6 C) (!) 100.5 F (38.1 C)  TempSrc: Oral Oral Oral Oral  SpO2: 100%   100%  Weight:      Height:       CBC:  Recent Labs  Lab 10/17/20 1740 10/17/20 1749 10/23/20 0500 10/24/20 0915  WBC 6.3   < > 6.8 10.9*  NEUTROABS 1.8  --   --   --   HGB 13.8   < > 7.9* 9.4*  HCT 41.7   < > 23.7* 28.7*  MCV 93.3   < > 92.9 92.0  PLT 250   < > 194 246   < > = values in this interval not displayed.   Basic Metabolic Panel:  Recent Labs  Lab 10/21/20 0520 10/22/20 0505 10/23/20 0500 10/24/20 0418  NA 148* 147* 141 138  K 3.3* 3.2* 3.3* 3.9  CL 118* 116* 110 105  CO2 25 22 27 24   GLUCOSE 122* 93 150* 143*  BUN <5* 5* 6 6  CREATININE 0.57 0.58 0.55 0.53  CALCIUM 8.7* 8.9 8.7* 9.4  MG 2.0 1.8  --  2.1  PHOS 3.1  --   --  3.3   Lipid Panel:  Recent Labs  Lab 10/18/20 0427 10/18/20 0428  CHOL 108  --   TRIG 29 29  HDL 35*  --   CHOLHDL 3.1  --   VLDL 6  --   LDLCALC 67  --    HgbA1c:  Lab Results  Component Value Date   HGBA1C 5.6 10/18/2020   Urine Drug Screen:     Component Value Date/Time   LABOPIA NONE DETECTED 10/18/2020 0030   COCAINSCRNUR NONE DETECTED 10/18/2020 0030   LABBENZ NONE DETECTED 10/18/2020 0030   AMPHETMU NONE DETECTED 10/18/2020 0030   THCU POSITIVE (A) 10/18/2020 0030   LABBARB NONE DETECTED 10/18/2020 0030    Alcohol Level     Component Value Date/Time   ETH <10 10/17/2020 1740   IMAGING CT head 4/15 left frontoparietal intraparenchymal  hematoma, with IVH. 1.0 cm of left-to-right midline shift with effacement of the basilar cisterns  CTA Head and neck  4/15 Unremarkable, no AVM or aneurysm  Brain MRI 4/17 No mass or definite vascular lesion underlying the evacuated left parietal hematoma. There is accentuated intravascular enhancement along the superficial cavity which may be reactive; catheter angiography would be contributory. Resolved midline shift. No hydrocephalus  MRV 4/17 No evidence for venous thrombosis    Repeat CT head 4/19 No recurrent hemorrhage. Decreased brain swelling and midline shift. No hydrocephalus.  2D Echo 1. Left ventricular ejection fraction, by estimation, is 55 to 60%. The  left ventricle has normal function. The left ventricle has no regional  wall motion abnormalities. Left ventricular diastolic function could not  be evaluated.  2. Right ventricular systolic function is normal. The right ventricular  size is normal.  3. The mitral valve is normal in structure. No evidence of mitral valve  regurgitation.  4. The aortic valve is normal in structure. Aortic valve regurgitation is not visualized.   PHYSICAL EXAM  Temp:  [98.3 F (36.8 C)-100.5 F (38.1 C)] 100.5 F (38.1 C) (04/22 1207) Pulse Rate:  [78-103] 94 (04/22 1207) Resp:  [18-32] 18 (04/22 0356) BP: (110-146)/(65-94) 116/81 (04/22 1207) SpO2:  [97 %-100 %] 100 % (04/22 1207)  General - Well nourished, well developed, in no apparent distress.  Ophthalmologic - fundi not visualized due to noncooperation.  Cardiovascular - Regular rhythm and rate.  Neuro - awake alert, eyes open, able to tell me her name and say her age [age], mild to moderate dysarthria. However, she can say "I am OK" "a little bit" "what you say", but no whole sentences yet. Able to following peripheral commands with prompt and encouragement, able to mimic, able to follow midline commands. Tracking bilaterally, visual field seems full today, PERRL.  no significant facial droop. Tongue protrusion midline. LUE and LLE spontaneous movement at least 4/5. RUE and RLE 2/5 today. Sensation not cooperative, coordination left FTN intact and gait not tested.   ASSESSMENT/PLAN Jane Martin is a 25 y.o. female with no history, presented to ED by EMS after collapsing at a gas station. It was told that she yelled out for help and for someone to call 911, then collapsed to the ground. When EMS arrived, she was unresponsive which worsened to comatose. Intubated in ED.    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 Etiology unknown.  Repeat HCT stable 4/19  On Keppra s/p craniotomy   VTE prophylaxis - heparin IV  No antithrombotic prior to admission, now on No antithrombotic  Therapy recommendations:  CIR  Disposition:  TBD  Cerebral edema  CT and MRI post hematoma evaluation showed resolved midline shift  3% saline weaned to off   Na 146-153-148->149->147->141  ? Abnormal brain tissue  Per Dr. Danielle Dess path report showed normal brain tissue.   Pathology negative so far  MRI - there is accentuated intravascular enhancement along the superficial cavity which may be reactive; catheter angiography may be considered when patient is more stable.  Will consider cerebral angiogram with Dr. Corliss Skains next week after COVID isolation lifted.  Respiratory failure  Extubated 4/18, CCM monitoring airway protection due to AMS  CCM on board  Extubated 10/20/20  Tolerating well  ?? Hypertension . BP goal < 160 . BP stable  . Off A line .  Asymptomatic bradycardia  Seems to worsen with precedex, precedex off, now resolved  HR regular rate and rhythm now.  Agitation  Precedex discontinued   PRN clonazepam, will try to avoid IV sedation.   LUE restraint: pulling at lines and tubes  Birth control  Mom stated that pt has a hormone depo for birth control  Not sure the  name  Concerning both estrogen and progesterone containing  Pt not smoker but vaping - mom not sure if nicotine containing  MRV showed no evidence for cerebral sinus venous thrombosis.  COVID positive  Asymptomatic  Mom does not know any potential exposure  On isolation through 4/25 (10 days)  Acute blood loss anemia  Hgb 9.6->8.8->8.1->7.0->7.6->8.7->9.4  Monitor  CCM on board  Dysphagia, Feeding/Nutrition  Cortrak tube re-placed  On clear liquid diet but not eating much  TF Osmolite per protocol  Dietitian on board  Urinary Retention  Foley placed 4/18  On urecholine  Other Stroke Risk Factors  Vaping - ? Nicotine  or non-nicotine (mom not sure)   Family hx stroke (Mom had a stroke)  Hypokalemia  Repleted   Monitor  CCM on board  Other Active Problems    Hospital day # 7    Marvel Plan, MD PhD Stroke Neurology 10/24/2020 5:27 PM

## 2020-10-24 NOTE — Progress Notes (Signed)
Occupational Therapy Treatment Patient Details Name: Jane Martin MRN: [id number] DOB: 12-18-1995 Today's Date: 10/24/2020    History of present illness 25 yo female presents to [facility] on 4/15 with collapse at gas station, followed by unresponsiveness. CTH show 5.6 cm in long axis acute left frontoparietal intraparenchymal hematoma, decompressing into the left lateral ventricle which contains a substantial amount of acute blood products which extend  into the third ventricle; 1.0 cm of left-to-right midline shift with effacement of the basilar cisterns. CTA head shows no aneurysm, LVO, or AVM. s/p L parietal crani with evacuation of intracerebral hemorrhage and placement of intraventricular catheter on 4/15. ETT 4/15-4/18. Cortrak placed 4/20. No pertinent PMH.   OT comments  Pt progressing towards established OT goals. Pt continues to present with lethargy but increased arousal and following simple cues. Pt performing bed mobility with Min-Mod A +2. Pt performing sit<>stand with Min A +2 and side steps with Max A +2. Pt participating in grooming task (washing face) in bed with supervision. Continue to recommend dc to CIR and will continue to follow acutely as admitted.    Follow Up Recommendations  CIR    Equipment Recommendations  Other (comment) (Defer to next venue)    Recommendations for Other Services PT consult    Precautions / Restrictions Precautions Precautions: Fall Precaution Comments: R inattention, Cortrak Restrictions Weight Bearing Restrictions: No       Mobility Bed Mobility Overal bed mobility: Needs Assistance Bed Mobility: Supine to Sit;Sit to Supine;Rolling Rolling: Min assist   Supine to sit: Mod assist;+2 for physical assistance Sit to supine: Min assist   General bed mobility comments: pt requries assistance to initiate supine to sit, needing physical assist to move LEs off edge of bed and elevate trunk    Transfers Overall transfer level: Needs  assistance Equipment used: 1 person hand held assist;2 person hand held assist Transfers: Sit to/from Stand Sit to Stand: Min assist;+2 safety/equipment         General transfer comment: pt requires hand hold to pull into stand and maintain balance    Balance Overall balance assessment: Needs assistance Sitting-balance support: No upper extremity supported;Feet supported Sitting balance-Leahy Scale: Fair     Standing balance support: Single extremity supported Standing balance-Leahy Scale: Poor Standing balance comment: minA with LUE support                           ADL either performed or assessed with clinical judgement   ADL Overall ADL's : Needs assistance/impaired     Grooming: Wash/dry face;Set up;Supervision/safety;Bed level                   Toilet Transfer: Minimal assistance;+2 for physical assistance;+2 for safety/equipment (simulated in room) Toilet Transfer Details (indicate cue type and reason): Min A +2 for balance and blocking of LLE         Functional mobility during ADLs: Minimal assistance;+2 for physical assistance (side steps) General ADL Comments: Pt continues to present with deficits in cognition, functional use of LUE, balance, and arousal     Vision       Perception     Praxis      Cognition Arousal/Alertness: Awake/alert Behavior During Therapy: Flat affect;Impulsive Overall Cognitive Status: Impaired/Different from baseline Area of Impairment: Orientation;Attention;Memory;Following commands;Safety/judgement;Awareness;Problem solving                 Orientation Level: Disoriented to;Place;Time;Situation Current Attention Level: Focused;Sustained (short duration) Memory:  Decreased recall of precautions;Decreased short-term memory Following Commands: Follows one step commands with increased time Safety/Judgement: Decreased awareness of safety;Decreased awareness of deficits Awareness: Intellectual Problem  Solving: Slow processing;Decreased initiation;Requires verbal cues;Requires tactile cues General Comments: Pt continues to present with lethargy adn requesting to go back to sleep. Pt following simple commands ~75% of time. Pt continues to present with aphasia but able to verbalize more words together this session.        Exercises     Shoulder Instructions       General Comments VSS on RA. BP stable    Pertinent Vitals/ Pain       Pain Assessment: Faces Faces Pain Scale: Hurts little more Pain Location: generalized, pt unable to report area of pain Pain Descriptors / Indicators: Grimacing Pain Intervention(s): Monitored during session;Limited activity within patient's tolerance;Repositioned  Home Living                                          Prior Functioning/Environment              Frequency  Min 2X/week        Progress Toward Goals  OT Goals(current goals can now be found in the care plan section)  Progress towards OT goals: Progressing toward goals  Acute Rehab OT Goals Patient Stated Goal: did not state OT Goal Formulation: Patient unable to participate in goal setting Time For Goal Achievement: 11/04/20 Potential to Achieve Goals: Good ADL Goals Pt Will Perform Grooming: with mod assist;bed level;sitting Pt Will Perform Upper Body Bathing: with mod assist;sitting;bed level Pt Will Transfer to Toilet: with mod assist;with +2 assist;stand pivot transfer;bedside commode Additional ADL Goal #1: Pt will follow one step commands during ADLs with Mod cues Additional ADL Goal #2: Pt will attend to ADL items in R visual field during ADL with Mod cues Additional ADL Goal #3: Pt will tolerate sitting at EOB for 10 minutes with Min A in preparation for ADLs  Plan Discharge plan remains appropriate    Co-evaluation    PT/OT/SLP Co-Evaluation/Treatment: Yes Reason for Co-Treatment: To address functional/ADL transfers;For patient/therapist  safety PT goals addressed during session: Mobility/safety with mobility;Balance;Strengthening/ROM OT goals addressed during session: ADL's and self-care      AM-PAC OT "6 Clicks" Daily Activity     Outcome Measure   Help from another person eating meals?: Total Help from another person taking care of personal grooming?: A Lot Help from another person toileting, which includes using toliet, bedpan, or urinal?: A Lot Help from another person bathing (including washing, rinsing, drying)?: A Lot Help from another person to put on and taking off regular upper body clothing?: A Lot Help from another person to put on and taking off regular lower body clothing?: A Lot 6 Click Score: 11    End of Session    OT Visit Diagnosis: Unsteadiness on feet (R26.81);Other abnormalities of gait and mobility (R26.89);Muscle weakness (generalized) (M62.81);Hemiplegia and hemiparesis;Pain;Low vision, both eyes (H54.2);Other symptoms and signs involving cognitive function Hemiplegia - Right/Left: Right Hemiplegia - caused by: Cerebral infarction Pain - part of body:  (Generalized; "it hurts")   Activity Tolerance Patient tolerated treatment well;Patient limited by lethargy   Patient Left in bed;with call bell/phone within reach;with bed alarm set   Nurse Communication Mobility status        Time: 5035-4656 OT Time Calculation (min): 28 min  Charges: OT  General Charges $OT Visit: 1 Visit OT Treatments $Self Care/Home Management : 8-22 mins  Dionne Rossa MSOT, OTR/L Acute Rehab Pager: [phone] Office: [phone]   Theodoro Grist Mylan Lengyel 10/24/2020, 3:15 PM

## 2020-10-24 NOTE — Progress Notes (Signed)
PROGRESS NOTE                                                                                                                                                                                                             Patient Demographics:    Jane Martin, is a 25 y.o. female, DOB - [birth year], UJW:[id number]  Outpatient Primary MD for the patient is Obgyn, Ma Hillock   Admit date - 10/17/2020   LOS - 7  Chief Complaint  Patient presents with  . Fall       Brief Narrative: Patient is a 25 y.o. female with no PMHx who collapsed at a local gas station-when EMS arrived-she was [name] was intubated in the emergency room-CT head showed a ICH with midline shift-taken emergently to the OR-and subsequently managed in the neuro ICU.  COVID-19 vaccinated status: Not known  Significant Events: 4/15>> collapsed at a local gas station-intubated in ED-found to have ICH  4/15>> craniotomy and hematoma evacuation. 4/18>> extubated 4/22>> transfer to [facility]  Significant studies: 4/15>> CT head: 5.6 cm acute left frontoparietal intraparenchymal hemorrhage-extending into the third ventricle.  1 cm left to right midline shift. 4/15>> CT C-spine: No acute cervical spine findings. 4/15>> CTA head: No intracranial aneurysm-no large vessel occlusion 4/15>> CTA neck: No evidence of dissection/aneurysm-large vessels are patent 4/16>> UDS: Positive for tetrahydrocannabinol 4/16>> CT: Evacuated left hemisphere hematoma-drain in place-resolved intracranial mass-effect 4/17>> MRI brain: No mass/vascular lesion underlying evacuated left parietal hematoma 4/17>> MRV brain: No evidence of venous thrombosis 4/17>> Echo: EF 55-60% 4/18>> bilateral lower extremity Doppler: No DVT. 4/19>> CT head: No recurrent hemorrhage.  Decrease brain swelling/midline shift.   COVID-19 medications: None  Antibiotics: None  Microbiology  data: None  Procedures: 4/15>> craniotomy and hematoma evacuation.  Consults: None  DVT prophylaxis: heparin injection 5,000 Units Start: 10/19/20 2200 SCDs Start: 10/17/20 2243     Subjective:    [name] today was sleeping but easily awakes-answering simple questions.  Following simple commands.   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  Continues to have RLE>> RLE weakness.  Remains on Keppra.  Will defer further work-up if necessary to neurology service.  Dysphagia: Secondary to above-NG tube feedings ongoing-also on full liquid diet-SLP following.  Acute hypoxic respiratory  failure: In setting of ICH-extubated on 4/18-on room air.  Asymptomatic bradycardia: While in the ICU-thought to be due to Precedex.  Monitor for now.  COVID-19 infection: Asymptomatic-unclear if she has been vaccinated.  Acute urinary retention: Foley catheter placed on 4/18-on Urecholine-we will attempt voiding trial over the next few days  Normocytic anemia: Suspect this is from acute illness-no evidence of blood loss from chart review.  Monitor for now.   ABG:    Component Value Date/Time   PHART 7.418 10/18/2020 1032   PCO2ART 36.0 10/18/2020 1032   PO2ART 147 (H) 10/18/2020 1032   HCO3 23.2 10/18/2020 1032   TCO2 24 10/18/2020 1032   ACIDBASEDEF 1.0 10/18/2020 1032   O2SAT 99.0 10/18/2020 1032    Vent Settings: N/A  Condition - Stable  Family Communication  : None at bedside-we will reach out to family over the next few days.  Code Status :  Full Code  Diet :  Diet Order            Diet full liquid Room service appropriate? Yes; Fluid consistency: Thin  Diet effective now                  Disposition Plan  :   Status is: Inpatient  Remains inpatient appropriate because:Inpatient level of care appropriate due  to severity of illness   Dispo: The patient is from: Home              Anticipated d/c is to: CIR              Patient currently is not medically stable to d/c.   Difficult to place patient No    Barriers to discharge: Dysphagia-NG tube in place  Antimicorbials  :    Anti-infectives (From admission, onward)   None      Inpatient Medications  Scheduled Meds: .  stroke: mapping our early stages of recovery book   Does not apply Once  . alteplase  2 mg Intracatheter Once  . alteplase  2 mg Intracatheter Once  . bethanechol  10 mg Per Tube TID  . chlorhexidine  15 mL Mouth Rinse BID  . Chlorhexidine Gluconate Cloth  6 each Topical Daily  . docusate  100 mg Per Tube BID  . feeding supplement (PROSource TF)  45 mL Per Tube TID  . heparin injection (subcutaneous)  5,000 Units Subcutaneous Q8H  . levETIRAcetam  500 mg Oral BID  . mouth rinse  15 mL Mouth Rinse q12n4p  . polyethylene glycol  17 g Per Tube Daily  . sennosides  5 mL Per Tube BID  . sodium chloride flush  10-40 mL Intracatheter Q12H   Continuous Infusions: . feeding supplement (OSMOLITE 1.5 CAL) 1,000 mL (10/23/20 1453)   PRN Meds:.acetaminophen **OR** acetaminophen (TYLENOL) oral liquid 160 mg/5 mL **OR** acetaminophen, artificial tears, bisacodyl, clonazePAM, labetalol, ondansetron **OR** ondansetron (ZOFRAN) IV, polyethylene glycol, promethazine, sodium chloride flush, sodium phosphate   Time Spent in minutes  25  See all Orders from today for further details   Jeoffrey MassedShanker Jalana Moore M.D on 10/24/2020 at 3:02 PM  To page go to [email or web address] - use universal password  [facility] -  Office  [phone]    Objective:   Vitals:   10/24/20 0356 10/24/20 0532 10/24/20 0800 10/24/20 1207  BP: (!) 146/75  116/87 116/81  Pulse: 93  78 94  Resp: 18     Temp: 100.3 F (37.9 C) 99.9 F (37.7 C) 99.7 F (37.6 C) (!)  100.5 F (38.1 C)  TempSrc: Oral Oral Oral Oral  SpO2: 100%   100%  Weight:      Height:         Wt Readings from Last 3 Encounters:  10/17/20 72.6 kg  06/09/19 65.8 kg  09/05/18 70.4 kg     Intake/Output Summary (Last 24 hours) at 10/24/2020 1502 Last data filed at 10/24/2020 7711 Gross per 24 hour  Intake 878.34 ml  Output 1675 ml  Net -796.66 ml     Physical Exam Gen Exam: Awake-answers simple questions-appears to have some mild dysarthria as well. HEENT:atraumatic, normocephalic Chest: B/L clear to auscultation anteriorly CVS:S1S2 regular Abdomen:soft non tender, non distended Extremities:no edema Neurology: RUE>> RLE weakness Skin: no rash   Data Review:    CBC Recent Labs  Lab 10/17/20 1740 10/17/20 1749 10/20/20 0500 10/20/20 1800 10/21/20 0520 10/22/20 0505 10/23/20 0500 10/24/20 0915  WBC 6.3   < > 5.6  --  6.8 6.3 6.8 10.9*  HGB 13.8   < > 7.0* 7.2* 7.1* 7.6* 7.9* 9.4*  HCT 41.7   < > 21.5* 23.2* 22.1* 23.3* 23.7* 28.7*  PLT 250   < > 124*  --  128* 162 194 246  MCV 93.3   < > 97.7  --  95.7 94.0 92.9 92.0  MCH 30.9   < > 31.8  --  30.7 30.6 31.0 30.1  MCHC 33.1   < > 32.6  --  32.1 32.6 33.3 32.8  RDW 12.0   < > 13.0  --  12.8 12.3 12.1 12.2  LYMPHSABS 3.9  --   --   --   --   --   --   --   MONOABS 0.5  --   --   --   --   --   --   --   EOSABS 0.0  --   --   --   --   --   --   --   BASOSABS 0.1  --   --   --   --   --   --   --    < > = values in this interval not displayed.    Chemistries  Recent Labs  Lab 10/17/20 1740 10/17/20 1749 10/17/20 2327 10/18/20 0427 10/19/20 1250 10/19/20 1752 10/20/20 0500 10/20/20 1200 10/20/20 1800 10/20/20 1820 10/21/20 0009 10/21/20 0520 10/22/20 0505 10/23/20 0500 10/24/20 0418  NA 136   < > 143   < > 146*   < > 148*   < >  --    < > 149* 148* 147* 141 138  K 3.0*   < > 2.8*   < >  --   --  3.5  --   --   --   --  3.3* 3.2* 3.3* 3.9  CL 107   < > 114*   < >  --   --  124*  --   --   --   --  118* 116* 110 105  CO2 19*  --  20*   < >  --   --  19*  --   --   --   --  25 22 27 24    GLUCOSE 156*   < > 122*   < >  --   --  87  --   --   --   --  122* 93 150* 143*  BUN 7   < > <5*   < >  --   --  <  5*  --   --   --   --  <5* 5* 6 6  CREATININE 0.81   < > 0.59   < >  --   --  0.52  --   --   --   --  0.57 0.58 0.55 0.53  CALCIUM 9.3  --  8.3*   < >  --   --  8.2*  --   --   --   --  8.7* 8.9 8.7* 9.4  MG  --   --   --   --   --    < > 2.0  --  2.0  --   --  2.0 1.8  --  2.1  AST 20  --  20  --  20  --   --   --   --   --   --   --  30  --   --   ALT 12  --  13  --  10  --   --   --   --   --   --   --  19  --   --   ALKPHOS 56  --  45  --  47  --   --   --   --   --   --   --  51  --   --   BILITOT 0.8  --  0.7  --  0.5  --   --   --   --   --   --   --  0.7  --   --    < > = values in this interval not displayed.   ------------------------------------------------------------------------------------------------------------------ No results for input(s): CHOL, HDL, LDLCALC, TRIG, CHOLHDL, LDLDIRECT in the last 72 hours.  Lab Results  Component Value Date   HGBA1C 5.6 10/18/2020   ------------------------------------------------------------------------------------------------------------------ No results for input(s): TSH, T4TOTAL, T3FREE, THYROIDAB in the last 72 hours.  Invalid input(s): FREET3 ------------------------------------------------------------------------------------------------------------------ No results for input(s): VITAMINB12, FOLATE, FERRITIN, TIBC, IRON, RETICCTPCT in the last 72 hours.  Coagulation profile Recent Labs  Lab 10/17/20 1740 10/19/20 0525  INR 1.3* 1.7*    No results for input(s): DDIMER in the last 72 hours.  Cardiac Enzymes No results for input(s): CKMB, TROPONINI, MYOGLOBIN in the last 168 hours.  Invalid input(s): CK ------------------------------------------------------------------------------------------------------------------ No results found for: BNP  Micro Results Recent Results (from the past 240 hour(s))  Resp  Panel by RT-PCR (Flu A&B, Covid) Nasopharyngeal Swab     Status: Abnormal   Collection Time: 10/17/20  6:02 PM   Specimen: Nasopharyngeal Swab; Nasopharyngeal(NP) swabs in vial transport medium  Result Value Ref Range Status   SARS Coronavirus 2 by RT PCR POSITIVE (A) NEGATIVE Final    Comment: RESULT CALLED TO, READ BACK BY AND VERIFIED WITH: MARGE RN 1942 10/17/20 EB (NOTE) SARS-CoV-2 target nucleic acids are DETECTED.  The SARS-CoV-2 RNA is generally detectable in upper respiratory specimens during the acute phase of infection. Positive results are indicative of the presence of the identified virus, but do not rule out bacterial infection or co-infection with other pathogens not detected by the test. Clinical correlation with patient history and other diagnostic information is necessary to determine patient infection status. The expected result is Negative.  Fact Sheet for Patients: BloggerCourse.com  Fact Sheet for Healthcare Providers: SeriousBroker.it  This test is not yet approved or cleared by the [place] FDA and  has been authorized for detection and/or  diagnosis of SARS-CoV-2 by FDA under an Emergency Use Authorization (EUA).  This EUA will remain in effect (meaning this test can be used) fo r the duration of  the COVID-19 declaration under Section 564(b)(1) of the Act, 21 U.S.C. section 360bbb-3(b)(1), unless the authorization is terminated or revoked sooner.     Influenza A by PCR NEGATIVE NEGATIVE Final   Influenza B by PCR NEGATIVE NEGATIVE Final    Comment: (NOTE) The Xpert Xpress SARS-CoV-2/FLU/RSV plus assay is intended as an aid in the diagnosis of influenza from Nasopharyngeal swab specimens and should not be used as a sole basis for treatment. Nasal washings and aspirates are unacceptable for Xpert Xpress SARS-CoV-2/FLU/RSV testing.  Fact Sheet for  Patients: BloggerCourse.com  Fact Sheet for Healthcare Providers: SeriousBroker.it  This test is not yet approved or cleared by the [place] FDA and has been authorized for detection and/or diagnosis of SARS-CoV-2 by FDA under an Emergency Use Authorization (EUA). This EUA will remain in effect (meaning this test can be used) for the duration of the COVID-19 declaration under Section 564(b)(1) of the Act, 21 U.S.C. section 360bbb-3(b)(1), unless the authorization is terminated or revoked.  Performed at [facility], [address], [place], [place] [address]   MRSA PCR Screening     Status: None   Collection Time: 10/18/20 10:29 AM   Specimen: Nasopharyngeal  Result Value Ref Range Status   MRSA by PCR NEGATIVE NEGATIVE Final    Comment:        The GeneXpert MRSA Assay (FDA approved for NASAL specimens only), is one component of a comprehensive MRSA colonization surveillance program. It is not intended to diagnose MRSA infection nor to guide or monitor treatment for MRSA infections. Performed at [facility], [address] [place], [place] [address]     Radiology Reports CT HEAD WO CONTRAST  Result Date: 10/21/2020 CLINICAL DATA:  Follow-up intracranial hemorrhage EXAM: CT HEAD WITHOUT CONTRAST TECHNIQUE: Contiguous axial images were obtained from the base of the skull through the vertex without intravenous contrast. COMPARISON:  Three days ago FINDINGS: Brain: Evacuated left cerebral hemorrhage with drain in place. No evidence of recurrent hemorrhage. Swelling and pneumocephalus is diminished. No progression of intraventricular clot in the left lateral ventricle. No hydrocephalus. Drain has been retracted so that the tip is in the region of hemorrhagic insult. Vascular: Negative Skull: Craniotomy with scalp swelling.  No interval change. Sinuses/Orbits: Mild generalized mucosal thickening. IMPRESSION: 1. No  recurrent hemorrhage. Decreased brain swelling and midline shift. 2. No hydrocephalus. Electronically Signed   By: Marnee Spring M.D.   On: 10/21/2020 06:53   CT HEAD WO CONTRAST  Result Date: 10/18/2020 CLINICAL DATA:  25 year old female code stroke presentation with large left hemisphere hemorrhage, intraventricular extension. Posturing on presentation. Postoperative day 1 status post craniotomy and evacuation of intracranial hematoma. EXAM: CT HEAD WITHOUT CONTRAST TECHNIQUE: Contiguous axial images were obtained from the base of the skull through the vertex without intravenous contrast. COMPARISON:  CT head, CTA head and neck 10/17/2020. FINDINGS: Brain: Small volume left side pneumocephalus. Vertex approach drainage catheter tracking to the left corona radiata and internal capsule. Substantial interval drainage of intra-axial hemorrhage previously centered at the posterior left middle frontal gyrus, anterior parietal lobe. Small volume residual intra-axial blood, in an area of about 3.4 cm along the course of the drainage catheter (series 3, image 28). Largely resolved regional mass effect. Largely resolved rightward midline shift. Trace extra-axial blood underlying the left craniotomy.  Moderate volume left lateral intraventricular hemorrhage is stable. Small volume blood both the 3rd and 4th ventricles, more apparent. Right lateral ventricle appears spared. No ventriculomegaly. Improved basilar cisterns. Partial suprasellar cistern effacement persists. No uncal herniation. No superimposed acute cortically based infarct identified. Vascular: No suspicious intracranial vascular hyperdensity. Skull: Left superior convexity craniotomy. Otherwise stable and intact. Sinuses/Orbits: Right nasoenteric tube remains in place. Fluid in the visible pharynx. Mild new paranasal sinus fluid and mucosal thickening. Tympanic cavities and mastoids remain clear. Other: Postoperative left scalp hematoma, superimposed small  volume left scalp fluid and gas collection. Overlying skin staples. Orbits soft tissues remain negative. IMPRESSION: 1. Significantly evacuated left hemisphere hemorrhage by surgery. Percutaneous drain remains in place tracking toward the left internal capsule. Small volume (roughly 3.4 cm diameter) residual intra-axial blood, fluid, and gas. 2. Largely resolved intracranial mass effect and midline shift. Residual partially effaced suprasellar cistern. 3. Stable to minimally increased intraventricular hemorrhage. No ventriculomegaly. 4. Trace extra-axial blood underlying the craniotomy. Small volume left-side pneumocephalus. Postoperative scalp hematoma. 5. No new intracranial abnormality identified. Electronically Signed   By: Odessa Fleming M.D.   On: 10/18/2020 05:54   CT HEAD WO CONTRAST  Result Date: 10/17/2020 CLINICAL DATA:  Patient is unresponsive and with abnormal posture EXAM: CT HEAD WITHOUT CONTRAST CT CERVICAL SPINE WITHOUT CONTRAST TECHNIQUE: Multidetector CT imaging of the head and cervical spine was performed following the standard protocol without intravenous contrast. Multiplanar CT image reconstructions of the cervical spine were also generated. COMPARISON:  None. FINDINGS: CT HEAD FINDINGS Brain: 5.6 by 3.8 by 4.4 cm (volume = 49 cm^3) acute left frontoparietal intraparenchymal hematoma is noted. This is decompressed into the left lateral ventricle which likewise contains a substantial amount of acute blood products which also extend into the third ventricle. There is approximately 1.0 cm of left-to-right midline shift. Borderline accentuated density along the tentorium and falx but overall I am skeptical that this represents subarachnoid hemorrhage. Effacement of the basilar cisterns including the suprasellar cistern and ambient cisterns. Vascular: Unremarkable Skull: Unremarkable Sinuses/Orbits: Mild chronic right maxillary sinusitis. CT CERVICAL SPINE FINDINGS Alignment: Reversal the normal  cervical lordosis but no subluxation. Skull base and vertebrae: No fracture or acute bony findings. Soft tissues and spinal canal: Unremarkable Disc levels:  No bony impingement. Upper chest: Unremarkable Other: Endotracheal and nasogastric tubes noted. IMPRESSION: 1. 5.6 cm in long axis acute left frontoparietal intraparenchymal hematoma, decompressing into the left lateral ventricle which contains a substantial amount of acute blood products which extend into the third ventricle. 1.0 cm of left-to-right midline shift with effacement of the basilar cisterns. 2. No acute cervical spine findings. 3. Chronic right maxillary sinusitis. Critical Value/emergent results were called by telephone at the time of interpretation on 10/17/2020 at 6:10 pm to provider Dr. Margarita Grizzle , who verbally acknowledged these results. Electronically Signed   By: Gaylyn Rong M.D.   On: 10/17/2020 18:19   CT Cervical Spine Wo Contrast  Result Date: 10/17/2020 CLINICAL DATA:  Patient is unresponsive and with abnormal posture EXAM: CT HEAD WITHOUT CONTRAST CT CERVICAL SPINE WITHOUT CONTRAST TECHNIQUE: Multidetector CT imaging of the head and cervical spine was performed following the standard protocol without intravenous contrast. Multiplanar CT image reconstructions of the cervical spine were also generated. COMPARISON:  None. FINDINGS: CT HEAD FINDINGS Brain: 5.6 by 3.8 by 4.4 cm (volume = 49 cm^3) acute left frontoparietal intraparenchymal hematoma is noted. This is decompressed into the left lateral ventricle which likewise contains a substantial amount of  acute blood products which also extend into the third ventricle. There is approximately 1.0 cm of left-to-right midline shift. Borderline accentuated density along the tentorium and falx but overall I am skeptical that this represents subarachnoid hemorrhage. Effacement of the basilar cisterns including the suprasellar cistern and ambient cisterns. Vascular: Unremarkable  Skull: Unremarkable Sinuses/Orbits: Mild chronic right maxillary sinusitis. CT CERVICAL SPINE FINDINGS Alignment: Reversal the normal cervical lordosis but no subluxation. Skull base and vertebrae: No fracture or acute bony findings. Soft tissues and spinal canal: Unremarkable Disc levels:  No bony impingement. Upper chest: Unremarkable Other: Endotracheal and nasogastric tubes noted. IMPRESSION: 1. 5.6 cm in long axis acute left frontoparietal intraparenchymal hematoma, decompressing into the left lateral ventricle which contains a substantial amount of acute blood products which extend into the third ventricle. 1.0 cm of left-to-right midline shift with effacement of the basilar cisterns. 2. No acute cervical spine findings. 3. Chronic right maxillary sinusitis. Critical Value/emergent results were called by telephone at the time of interpretation on 10/17/2020 at 6:10 pm to provider Dr. Margarita Grizzle , who verbally acknowledged these results. Electronically Signed   By: Gaylyn Rong M.D.   On: 10/17/2020 18:19   MR BRAIN W WO CONTRAST  Result Date: 10/19/2020 CLINICAL DATA:  Cerebral hemorrhage suspected EXAM: MRI HEAD WITHOUT AND WITH CONTRAST MRA HEAD WITHOUT AND WITH CONTRAST TECHNIQUE: Multiplanar, multiecho pulse sequences of the brain and surrounding structures were obtained without and with intravenous contrast. Angiographic images of the head were obtained using MRV technique without and with contrast. CONTRAST:  7.66mL GADAVIST GADOBUTROL 1 MMOL/ML IV SOLN COMPARISON:  Head CT from yesterday. CTA of the head neck from 2 days ago FINDINGS: MRI HEAD FINDINGS Brain: Evacuated left parietal hematoma with expected remaining fluid and blood products. Pneumocephalus has significantly decreased. Midline shift is resolved although there is still signs of elevated intracranial pressure with diffuse effacement of CSF spaces. Intraventricular extension of blood clot. Left-sided drain which traverses the  hematoma, tip near the left cerebral peduncle. The catheter does not directly communicate with the ventricular system which is nondilated. No masslike enhancement is seen adjacent to the hemorrhage. There is cortical restricted diffusion about the operative cavity. Accentuated vessels along the superficial aspect of the hematoma cavity, usually reactive. Vascular: As above.  Enhancing dural venous sinuses. Skull and upper cervical spine: Unremarkable craniotomy. Sinuses/Orbits: Negative MRV HEAD FINDINGS No dural venous sinus thrombosis is seen. Narrow right transverse and sigmoid sinuses attributed to developmental variation. No evidence of cortical vein thrombosis or shunting. IMPRESSION: Brain MRI: 1. No mass or definite vascular lesion underlying the evacuated left parietal hematoma. There is accentuated intravascular enhancement along the superficial cavity which may be reactive; catheter angiography would be contributory. 2. Resolved midline shift. No hydrocephalus. MRV: No evidence for venous thrombosis. Electronically Signed   By: Marnee Spring M.D.   On: 10/19/2020 05:50   DG Chest Portable 1 View  Result Date: 10/17/2020 CLINICAL DATA:  ET tube check EXAM: PORTABLE CHEST 1 VIEW COMPARISON:  Chest radiograph 10/18/2018 at 5:52 p.m. FINDINGS: Interval retraction of the endotracheal tube with tip approximately 1.5 cm above the carina. Enteric catheter in place. Stable cardiomediastinal contours. Low lung volumes but otherwise clear. No pneumothorax or pleural effusion. No acute finding in the visualized skeleton. IMPRESSION: Interval retraction of the endotracheal tube with tip approximately 1.5 cm above the carina. Electronically Signed   By: Emmaline Kluver M.D.   On: 10/17/2020 19:06   DG Chest [facility]  Result Date: 10/17/2020 CLINICAL DATA:  Unresponsive. EXAM: PORTABLE CHEST 1 VIEW COMPARISON:  None. FINDINGS: Single frontal view of the chest demonstrates endotracheal tube overlying  tracheal air column, tip overlying the right mainstem bronchus. Recommend retracting endotracheal tube approximately 4 cm to ensure adequate placement above the carina. Enteric catheter passes below diaphragm tip and side port projecting over gastric fundus. Cardiac silhouette is unremarkable. No airspace disease, effusion, or pneumothorax. There are no acute bony abnormalities. IMPRESSION: 1. Right mainstem intubation. Recommend retracting endotracheal tube approximately 4 cm to ensure adequate placement above carina. 2. No acute intrathoracic process. Critical Value/emergent results were called by telephone at the time of interpretation on 10/17/2020 at 6:09 pm to provider [facility] RAY , who verbally acknowledged these results. Electronically Signed   By: Sharlet Salina M.D.   On: 10/17/2020 18:09   DG Abd Portable 1V  Result Date: 10/18/2020 CLINICAL DATA:  Status post nasogastric tube placement EXAM: PORTABLE ABDOMEN - 1 VIEW COMPARISON:  None. FINDINGS: Nasogastric tube appears adequately positioned in the stomach with tip directed towards the stomach fundus. IMPRESSION: Nasogastric tube adequately positioned in the stomach. Electronically Signed   By: Bary Richard M.D.   On: 10/18/2020 12:36   MR MRV HEAD W WO CONTRAST  Result Date: 10/19/2020 CLINICAL DATA:  Cerebral hemorrhage suspected EXAM: MRI HEAD WITHOUT AND WITH CONTRAST MRA HEAD WITHOUT AND WITH CONTRAST TECHNIQUE: Multiplanar, multiecho pulse sequences of the brain and surrounding structures were obtained without and with intravenous contrast. Angiographic images of the head were obtained using MRV technique without and with contrast. CONTRAST:  7.52mL GADAVIST GADOBUTROL 1 MMOL/ML IV SOLN COMPARISON:  Head CT from yesterday. CTA of the head neck from 2 days ago FINDINGS: MRI HEAD FINDINGS Brain: Evacuated left parietal hematoma with expected remaining fluid and blood products. Pneumocephalus has significantly decreased. Midline shift is  resolved although there is still signs of elevated intracranial pressure with diffuse effacement of CSF spaces. Intraventricular extension of blood clot. Left-sided drain which traverses the hematoma, tip near the left cerebral peduncle. The catheter does not directly communicate with the ventricular system which is nondilated. No masslike enhancement is seen adjacent to the hemorrhage. There is cortical restricted diffusion about the operative cavity. Accentuated vessels along the superficial aspect of the hematoma cavity, usually reactive. Vascular: As above.  Enhancing dural venous sinuses. Skull and upper cervical spine: Unremarkable craniotomy. Sinuses/Orbits: Negative MRV HEAD FINDINGS No dural venous sinus thrombosis is seen. Narrow right transverse and sigmoid sinuses attributed to developmental variation. No evidence of cortical vein thrombosis or shunting. IMPRESSION: Brain MRI: 1. No mass or definite vascular lesion underlying the evacuated left parietal hematoma. There is accentuated intravascular enhancement along the superficial cavity which may be reactive; catheter angiography would be contributory. 2. Resolved midline shift. No hydrocephalus. MRV: No evidence for venous thrombosis. Electronically Signed   By: Marnee Spring M.D.   On: 10/19/2020 05:50   ECHOCARDIOGRAM COMPLETE  Result Date: 10/20/2020    ECHOCARDIOGRAM REPORT   Patient Name:   PHYNIX HORTON Date of Exam: 10/19/2020 Medical Rec #:  [id number]        Height:       68.0 in Accession #:    [id number]       Weight:       160.0 lb Date of Birth:  07/25/1995        BSA:          1.859 m Patient Age:    24 years  BP:           100/72 mmHg Patient Gender: F                HR:           71 bpm. Exam Location:  Inpatient Procedure: 2D Echo, Cardiac Doppler and Color Doppler Indications:    Stroke I63.9  History:        Patient has no prior history of Echocardiogram examinations.                 Covid 19 positive.  Sonographer:     Roosvelt Maser RDCS Referring Phys: [id number] IMPRESSIONS  1. Left ventricular ejection fraction, by estimation, is 55 to 60%. The left ventricle has normal function. The left ventricle has no regional wall motion abnormalities. Left ventricular diastolic function could not be evaluated.  2. Right ventricular systolic function is normal. The right ventricular size is normal.  3. The mitral valve is normal in structure. No evidence of mitral valve regurgitation.  4. The aortic valve is normal in structure. Aortic valve regurgitation is not visualized. Comparison(s): No prior Echocardiogram. Conclusion(s)/Recommendation(s): Otherwise normal echocardiogram, with minor abnormalities described in the report. FINDINGS  Left Ventricle: Left ventricular ejection fraction, by estimation, is 55 to 60%. The left ventricle has normal function. The left ventricle has no regional wall motion abnormalities. The left ventricular internal cavity size was normal in size. There is  no left ventricular hypertrophy. Left ventricular diastolic function could not be evaluated. Right Ventricle: The right ventricular size is normal. No increase in right ventricular wall thickness. Right ventricular systolic function is normal. Left Atrium: Left atrial size was normal in size. Right Atrium: Right atrial size was normal in size. Pericardium: There is no evidence of pericardial effusion. Mitral Valve: The mitral valve is normal in structure. No evidence of mitral valve regurgitation. Tricuspid Valve: The tricuspid valve is normal in structure. Tricuspid valve regurgitation is not demonstrated. Aortic Valve: The aortic valve is normal in structure. Aortic valve regurgitation is not visualized. Pulmonic Valve: The pulmonic valve was not assessed. Pulmonic valve regurgitation is not visualized. Aorta: The aortic root is normal in size and structure. IAS/Shunts: The atrial septum is grossly normal. Riley Lam MD Electronically  signed by Riley Lam MD Signature Date/Time: 10/20/2020/12:04:00 PM    Final    VAS [place] LOWER EXTREMITY VENOUS (DVT)  Result Date: 10/21/2020  Lower Venous DVT Study Indications: Stroke.  Risk Factors: ICH, Covid+. Comparison Study: No previous exams Performing Technologist: Ernestene Mention  Examination Guidelines: A complete evaluation includes B-mode imaging, spectral Doppler, color Doppler, and power Doppler as needed of all accessible portions of each vessel. Bilateral testing is considered an integral part of a complete examination. Limited examinations for reoccurring indications may be performed as noted. The reflux portion of the exam is performed with the patient in reverse Trendelenburg.  +---------+---------------+---------+-----------+----------+--------------+ RIGHT    CompressibilityPhasicitySpontaneityPropertiesThrombus Aging +---------+---------------+---------+-----------+----------+--------------+ CFV      Full           Yes      Yes                                 +---------+---------------+---------+-----------+----------+--------------+ SFJ      Full                                                        +---------+---------------+---------+-----------+----------+--------------+  FV Prox  Full           Yes      Yes                                 +---------+---------------+---------+-----------+----------+--------------+ FV Mid   Full           Yes      Yes                                 +---------+---------------+---------+-----------+----------+--------------+ FV DistalFull           Yes      Yes                                 +---------+---------------+---------+-----------+----------+--------------+ PFV      Full                                                        +---------+---------------+---------+-----------+----------+--------------+ POP      Full           Yes      Yes                                  +---------+---------------+---------+-----------+----------+--------------+ PTV      Full                                                        +---------+---------------+---------+-----------+----------+--------------+ PERO     Full                                                        +---------+---------------+---------+-----------+----------+--------------+   +---------+---------------+---------+-----------+----------+--------------+ LEFT     CompressibilityPhasicitySpontaneityPropertiesThrombus Aging +---------+---------------+---------+-----------+----------+--------------+ CFV      Full           Yes      Yes                                 +---------+---------------+---------+-----------+----------+--------------+ SFJ      Full                                                        +---------+---------------+---------+-----------+----------+--------------+ FV Prox  Full           Yes      Yes                                 +---------+---------------+---------+-----------+----------+--------------+ FV Mid   Full  Yes      Yes                                 +---------+---------------+---------+-----------+----------+--------------+ FV DistalFull           Yes      Yes                                 +---------+---------------+---------+-----------+----------+--------------+ PFV      Full                                                        +---------+---------------+---------+-----------+----------+--------------+ POP      Full           Yes      Yes                                 +---------+---------------+---------+-----------+----------+--------------+ PTV      Full                                                        +---------+---------------+---------+-----------+----------+--------------+ PERO     Full                                                         +---------+---------------+---------+-----------+----------+--------------+     Summary: BILATERAL: - No evidence of deep vein thrombosis seen in the lower extremities, bilaterally. - No evidence of superficial venous thrombosis in the lower extremities, bilaterally. -No evidence of popliteal cyst, bilaterally.   *See table(s) above for measurements and observations. Electronically signed by Gretta Began MD on 10/21/2020 at 5:10:53 PM.    Final    [place] EKG SITE RITE  Result Date: 10/19/2020 If Site Rite image not attached, placement could not be confirmed due to current cardiac rhythm.  CT ANGIO HEAD NECK W WO CM (CODE STROKE)  Result Date: 10/17/2020 CLINICAL DATA:  Patient unresponsive with abnormal posture. EXAM: CT HEAD WITHOUT CONTRAST CT ANGIOGRAPHY HEAD AND NECK TECHNIQUE: Multidetector CT imaging of the head and neck was performed using the standard protocol during bolus administration of intravenous contrast. Multiplanar CT image reconstructions and MIPs were obtained to evaluate the vascular anatomy. Carotid stenosis measurements (when applicable) are obtained utilizing NASCET criteria, using the distal internal carotid diameter as the denominator. CONTRAST:  75mL OMNIPAQUE IOHEXOL 350 MG/ML SOLN COMPARISON:  CT head/cervical spine performed earlier today 10/17/2020. FINDINGS: CTA NECK FINDINGS Aortic arch: Standard aortic branching. The visualized aortic arch is normal in caliber. No hemodynamically significant innominate or proximal subclavian artery stenosis. Right carotid system: CCA and ICA patent within the neck without stenosis. Left carotid system: CCA and ICA patent within the neck without stenosis. Vertebral arteries: The intracranial vertebral arteries are patent. Skeleton: No acute bony abnormality or aggressive osseous lesion. Other neck: No neck mass  or cervical lymphadenopathy. Upper chest: No consolidation within the imaged lung apices. Partially imaged ET tube extending into the right  mainstem bronchus. Partially imaged enteric tube. Review of the MIP images confirms the above findings CTA HEAD FINDINGS Anterior circulation: The intracranial internal carotid arteries are patent. The M1 middle cerebral arteries are patent. No M2 proximal branch occlusion or high-grade proximal stenosis is identified. The anterior cerebral arteries are patent. No intracranial aneurysm is identified. No evidence of AVM. No contrast blush at the site of the patient's left cerebral hemisphere acute parenchymal hematoma. Posterior circulation: The intracranial vertebral arteries are patent. The basilar artery is patent. The posterior cerebral arteries are patent. Venous sinuses: The dural venous sinuses are poorly assessed due to contrast bolus timing. Anatomic variants: None significant Review of the MIP images confirms the above findings Findings of right mainstem bronchus intubation called by telephone at the time of interpretation on 10/17/2020 at 6:31 pm to provider [facility] RAY , who verbally acknowledged these results. IMPRESSION: CTA neck: 1. The common carotid, internal carotid and vertebral arteries are patent within the neck without stenosis. No evidence of dissection or aneurysm. 2. Right mainstem intubation. CTA head: 1. No intracranial aneurysm or AVM is identified. However, a contrast-enhanced brain MRI and MR angiography should be considered once the hematoma involute to further evaluate for any underlying lesion. 2. No contrast blush in the region of the left cerebral hemisphere acute parenchymal hematoma. 3. No intracranial large vessel occlusion or proximal high-grade arterial stenosis. Electronically Signed   By: Jackey Loge DO   On: 10/17/2020 18:39

## 2020-10-24 NOTE — Progress Notes (Signed)
Physical Therapy Treatment Patient Details Name: Jane Martin MRN: [id number] DOB: [birth year] Today's Date: 10/24/2020    History of Present Illness 25 yo female presents to [facility] on 4/15 with collapse at gas station, followed by unresponsiveness. CTH show 5.6 cm in long axis acute left frontoparietal intraparenchymal hematoma, decompressing into the left lateral ventricle which contains a substantial amount of acute blood products which extend  into the third ventricle; 1.0 cm of left-to-right midline shift with effacement of the basilar cisterns. CTA head shows no aneurysm, LVO, or AVM. s/p L parietal crani with evacuation of intracerebral hemorrhage and placement of intraventricular catheter on 4/15. ETT 4/15-4/18. Cortrak placed 4/20. No pertinent PMH.    PT Comments    Pt with improved transfer quality and is able to initiate gait training this session. Pt remains neglectful of R side and requires cues to attend during session. Pt continues to demonstrate impaired awareness of deficits, exacerbating her falls risk. Pt will benefit from aggressive mobilization and acute PT POC to improve standing balance and to reduce assistance requirements when transferring. PT continues to recommend CIR placement at this time.   Follow Up Recommendations  CIR     Equipment Recommendations  Wheelchair (measurements PT) (hemiwalker)    Recommendations for Other Services       Precautions / Restrictions Precautions Precautions: Fall Precaution Comments: R inattention, Cortrak Restrictions Weight Bearing Restrictions: No    Mobility  Bed Mobility Overal bed mobility: Needs Assistance Bed Mobility: Supine to Sit;Sit to Supine;Rolling Rolling: Min assist   Supine to sit: Mod assist;+2 for physical assistance Sit to supine: Min assist   General bed mobility comments: pt requries assistance to initiate supine to sit, needing physical assist to move LEs off edge of bed and elevate trunk     Transfers Overall transfer level: Needs assistance Equipment used: 1 person hand held assist;2 person hand held assist Transfers: Sit to/from Stand Sit to Stand: Min assist;+2 safety/equipment         General transfer comment: pt requires hand hold to pull into stand and maintain balance  Ambulation/Gait Ambulation/Gait assistance: Mod assist;+2 physical assistance Gait Distance (Feet): 3 Feet Assistive device: 2 person hand held assist Gait Pattern/deviations: Shuffle Gait velocity: reduced Gait velocity interpretation: <1.31 ft/sec, indicative of household ambulator General Gait Details: pt side stepping toward R at edge of bed, needs assistance from PT to advance RLE as well as facilitation of weight shift toward left. Pt later marches and is able to lift both feet off ground   Stairs             Wheelchair Mobility    Modified Rankin (Stroke Patients Only) Modified Rankin (Stroke Patients Only) Pre-Morbid Rankin Score: No symptoms Modified Rankin: Moderately severe disability     Balance Overall balance assessment: Needs assistance Sitting-balance support: No upper extremity supported;Feet supported Sitting balance-Leahy Scale: Fair     Standing balance support: Single extremity supported Standing balance-Leahy Scale: Poor Standing balance comment: minA with LUE support                            Cognition Arousal/Alertness: Awake/alert Behavior During Therapy: Flat affect;Impulsive Overall Cognitive Status: Impaired/Different from baseline Area of Impairment: Orientation;Attention;Memory;Following commands;Safety/judgement;Awareness;Problem solving                 Orientation Level: Disoriented to;Place;Time;Situation Current Attention Level: Focused Memory: Decreased recall of precautions;Decreased short-term memory Following Commands: Follows one step commands with  increased time Safety/Judgement: Decreased awareness of  safety;Decreased awareness of deficits Awareness: Intellectual Problem Solving: Slow processing;Decreased initiation;Requires verbal cues;Requires tactile cues        Exercises      General Comments General comments (skin integrity, edema, etc.): VSS on RA      Pertinent Vitals/Pain Pain Assessment: Faces Faces Pain Scale: Hurts little more Pain Location: generalized, pt unable to report area of pain Pain Descriptors / Indicators: Grimacing Pain Intervention(s): Monitored during session    Home Living                      Prior Function            PT Goals (current goals can now be found in the care plan section) Acute Rehab PT Goals Patient Stated Goal: did not state Progress towards PT goals: Progressing toward goals    Frequency    Min 4X/week      PT Plan Current plan remains appropriate    Co-evaluation PT/OT/SLP Co-Evaluation/Treatment: Yes Reason for Co-Treatment: Complexity of the patient's impairments (multi-system involvement);Necessary to address cognition/behavior during functional activity;For patient/therapist safety;To address functional/ADL transfers PT goals addressed during session: Mobility/safety with mobility;Balance;Strengthening/ROM        AM-PAC PT "6 Clicks" Mobility   Outcome Measure  Help needed turning from your back to your side while in a flat bed without using bedrails?: A Little Help needed moving from lying on your back to sitting on the side of a flat bed without using bedrails?: A Lot Help needed moving to and from a bed to a chair (including a wheelchair)?: A Lot Help needed standing up from a chair using your arms (e.g., wheelchair or bedside chair)?: A Little Help needed to walk in hospital room?: A Lot Help needed climbing 3-5 steps with a railing? : Total 6 Click Score: 13    End of Session   Activity Tolerance: Patient limited by fatigue Patient left: in bed;with call bell/phone within reach;with bed  alarm set Nurse Communication: Mobility status PT Visit Diagnosis: Other abnormalities of gait and mobility (R26.89);Difficulty in walking, not elsewhere classified (R26.2);Hemiplegia and hemiparesis;Unsteadiness on feet (R26.81);Muscle weakness (generalized) (M62.81);Other symptoms and signs involving the nervous system (R29.898) Hemiplegia - Right/Left: Right Hemiplegia - dominant/non-dominant: Dominant Hemiplegia - caused by: Other cerebrovascular disease     Time: 0350-0938 PT Time Calculation (min) (ACUTE ONLY): 28 min  Charges:  $Therapeutic Activity: 8-22 mins                     Arlyss Gandy, PT, DPT Acute Rehabilitation Pager: [phone]    Arlyss Gandy 10/24/2020, 3:01 PM

## 2020-10-24 NOTE — Plan of Care (Signed)
  Problem: Education: Goal: Individualized Educational Video(s) Outcome: Not Applicable   Problem: Education: Goal: Knowledge of patient specific risk factors addressed and post discharge goals established will improve Outcome: Progressing   Problem: Education: Goal: Knowledge of secondary prevention will improve Outcome: Progressing   Problem: Education: Goal: Knowledge of disease or condition will improve Outcome: Not Applicable   Problem: Coping: Goal: Will verbalize positive feelings about self Outcome: Progressing

## 2020-10-24 NOTE — Plan of Care (Signed)
  Problem: Education: Goal: Knowledge of secondary prevention will improve Outcome: Progressing Goal: Knowledge of patient specific risk factors addressed and post discharge goals established will improve Outcome: Progressing   Problem: Coping: Goal: Will verbalize positive feelings about self Outcome: Progressing Goal: Will identify appropriate support needs Outcome: Progressing   Problem: Health Behavior/Discharge Planning: Goal: Ability to manage health-related needs will improve Outcome: Progressing   Problem: Self-Care: Goal: Ability to participate in self-care as condition permits will improve Outcome: Progressing Goal: Verbalization of feelings and concerns over difficulty with self-care will improve Outcome: Progressing Goal: Ability to communicate needs accurately will improve Outcome: Progressing   Problem: Nutrition: Goal: Risk of aspiration will decrease Outcome: Progressing Goal: Dietary intake will improve Outcome: Progressing   Problem: Intracerebral Hemorrhage Tissue Perfusion: Goal: Complications of Intracerebral Hemorrhage will be minimized Outcome: Progressing   Problem: Safety: Goal: Non-violent Restraint(s) Outcome: Progressing   

## 2020-10-25 DIAGNOSIS — W19XXXD Unspecified fall, subsequent encounter: Secondary | ICD-10-CM

## 2020-10-25 DIAGNOSIS — R531 Weakness: Secondary | ICD-10-CM

## 2020-10-25 DIAGNOSIS — I611 Nontraumatic intracerebral hemorrhage in hemisphere, cortical: Secondary | ICD-10-CM | POA: Diagnosis not present

## 2020-10-25 LAB — GLUCOSE, CAPILLARY
Glucose-Capillary: 118 mg/dL — ABNORMAL HIGH (ref 70–99)
Glucose-Capillary: 123 mg/dL — ABNORMAL HIGH (ref 70–99)
Glucose-Capillary: 123 mg/dL — ABNORMAL HIGH (ref 70–99)
Glucose-Capillary: 142 mg/dL — ABNORMAL HIGH (ref 70–99)
Glucose-Capillary: 79 mg/dL (ref 70–99)

## 2020-10-25 LAB — CBC
HCT: 28.6 % — ABNORMAL LOW (ref 36.0–46.0)
Hemoglobin: 9.4 g/dL — ABNORMAL LOW (ref 12.0–15.0)
MCH: 30.4 pg (ref 26.0–34.0)
MCHC: 32.9 g/dL (ref 30.0–36.0)
MCV: 92.6 fL (ref 80.0–100.0)
Platelets: 252 10*3/uL (ref 150–400)
RBC: 3.09 MIL/uL — ABNORMAL LOW (ref 3.87–5.11)
RDW: 12.5 % (ref 11.5–15.5)
WBC: 9.5 10*3/uL (ref 4.0–10.5)
nRBC: 0 % (ref 0.0–0.2)

## 2020-10-25 LAB — BASIC METABOLIC PANEL
Anion gap: 8 (ref 5–15)
BUN: 8 mg/dL (ref 6–20)
CO2: 28 mmol/L (ref 22–32)
Calcium: 9.4 mg/dL (ref 8.9–10.3)
Chloride: 101 mmol/L (ref 98–111)
Creatinine, Ser: 0.53 mg/dL (ref 0.44–1.00)
GFR, Estimated: >60 mL/min (ref >60)
Glucose, Bld: 121 mg/dL — ABNORMAL HIGH (ref 70–99)
Potassium: 4 mmol/L (ref 3.5–5.1)
Sodium: 137 mmol/L (ref 135–145)

## 2020-10-25 MED ORDER — LABETALOL HCL 5 MG/ML IV SOLN: 10.0000 mg | INTRAVENOUS | Status: DC | PRN

## 2020-10-25 MED ORDER — FREE WATER
250.0000 mL | Status: DC
  Administered 2020-10-25 – 2020-10-27 (×11): 250 mL

## 2020-10-25 NOTE — Progress Notes (Signed)
STROKE TEAM PROGRESS NOTE   SUBJECTIVE (INTERVAL HISTORY) No family is at the bedside, pt is sleeping but easily arousable and able to keep awake during exam, her language and right sided weakness slowly improving. Plan for cerebral angiogram once off isolation for COVID.   OBJECTIVE Vitals:   10/25/20 0042 10/25/20 0353 10/25/20 0733 10/25/20 1106  BP: 134/83 117/79 113/82 121/79  Pulse: 89 84 84 91  Resp: 18 17 17 18   Temp: 98.5 F (36.9 C) 99.9 F (37.7 C) 99.7 F (37.6 C) 97.7 F (36.5 C)  TempSrc: Oral Oral Oral Oral  SpO2: 100% 100% 100% 100%  Weight:      Height:       CBC:  Recent Labs  Lab 10/24/20 0915 10/25/20 0512  WBC 10.9* 9.5  HGB 9.4* 9.4*  HCT 28.7* 28.6*  MCV 92.0 92.6  PLT 246 252   Basic Metabolic Panel:  Recent Labs  Lab 10/21/20 0520 10/22/20 0505 10/23/20 0500 10/24/20 0418 10/25/20 0512  NA 148* 147*   < > 138 137  K 3.3* 3.2*   < > 3.9 4.0  CL 118* 116*   < > 105 101  CO2 25 22   < > 24 28  GLUCOSE 122* 93   < > 143* 121*  BUN <5* 5*   < > 6 8  CREATININE 0.57 0.58   < > 0.53 0.53  CALCIUM 8.7* 8.9   < > 9.4 9.4  MG 2.0 1.8  --  2.1  --   PHOS 3.1  --   --  3.3  --    < > = values in this interval not displayed.   Lipid Panel:  No results for input(s): CHOL, TRIG, HDL, CHOLHDL, VLDL, LDLCALC in the last 168 hours. HgbA1c:  Lab Results  Component Value Date   HGBA1C 5.6 10/18/2020   Urine Drug Screen:     Component Value Date/Time   LABOPIA NONE DETECTED 10/18/2020 0030   COCAINSCRNUR NONE DETECTED 10/18/2020 0030   LABBENZ NONE DETECTED 10/18/2020 0030   AMPHETMU NONE DETECTED 10/18/2020 0030   THCU POSITIVE (A) 10/18/2020 0030   LABBARB NONE DETECTED 10/18/2020 0030    Alcohol Level     Component Value Date/Time   ETH <10 10/17/2020 1740   IMAGING CT head 4/15 left frontoparietal intraparenchymal hematoma, with IVH. 1.0 cm of left-to-right midline shift with effacement of the basilar cisterns  CTA Head and neck   4/15 Unremarkable, no AVM or aneurysm  Brain MRI 4/17 No mass or definite vascular lesion underlying the evacuated left parietal hematoma. There is accentuated intravascular enhancement along the superficial cavity which may be reactive; catheter angiography would be contributory. Resolved midline shift. No hydrocephalus  MRV 4/17 No evidence for venous thrombosis    Repeat CT head 4/19 No recurrent hemorrhage. Decreased brain swelling and midline shift. No hydrocephalus.  2D Echo 1. Left ventricular ejection fraction, by estimation, is 55 to 60%. The  left ventricle has normal function. The left ventricle has no regional  wall motion abnormalities. Left ventricular diastolic function could not  be evaluated.  2. Right ventricular systolic function is normal. The right ventricular  size is normal.  3. The mitral valve is normal in structure. No evidence of mitral valve  regurgitation.  4. The aortic valve is normal in structure. Aortic valve regurgitation is not visualized.   PHYSICAL EXAM  Temp:  [97.7 F (36.5 C)-99.9 F (37.7 C)] 97.7 F (36.5 C) (04/23 1106) Pulse  Rate:  [84-97] 91 (04/23 1106) Resp:  [17-18] 18 (04/23 1106) BP: (113-138)/(71-83) 121/79 (04/23 1106) SpO2:  [98 %-100 %] 100 % (04/23 1106)  General - Well nourished, well developed, in no apparent distress.  Ophthalmologic - fundi not visualized due to noncooperation.  Cardiovascular - Regular rhythm and rate.  Neuro - awake alert, eyes open, able to tell me her name and say her age [age], mild to moderate dysarthria. However, she can say "I am OK" "a little bit" "what you say", but no whole sentences yet. Able to following peripheral commands with prompt and encouragement, able to mimic, able to follow midline commands. Tracking bilaterally, visual field seems full today, PERRL. no significant facial droop. Tongue protrusion midline. LUE and LLE spontaneous movement at least 4/5. RUE and RLE 2/5  today. Sensation not cooperative, coordination left FTN intact and gait not tested.   ASSESSMENT/PLAN [name] is a 25 y.o. female with no history, presented to ED by EMS after collapsing at a gas station. It was told that she yelled out for help and for someone to call 911, then collapsed to the ground. When EMS arrived, she was unresponsive which worsened to comatose. Intubated in ED.    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 Etiology unknown.  Repeat HCT stable 4/19  On Keppra s/p craniotomy   VTE prophylaxis - heparin SQ  No antithrombotic prior to admission, now on No antithrombotic  Therapy recommendations:  CIR  Disposition:  TBD  Cerebral edema  CT and MRI post hematoma evaluation showed resolved midline shift  3% saline weaned to off   Na 146-153-148->149->147->141->137  ? Abnormal brain tissue  Per Dr. Danielle Dess path report showed abnormal brain tissue.   Pathology negative so far  MRI - there is accentuated intravascular enhancement along the superficial cavity which may be reactive; catheter angiography may be considered when patient is more stable.  Will consider cerebral angiogram with Dr. Corliss Skains next week after COVID isolation lifted.  Respiratory failure  Extubated 10/20/20  Tolerating well, on room air  ?? Hypertension . BP goal < 160 . BP stable   Asymptomatic bradycardia  Seemed to worsen with precedex, precedex off, now resolved  HR regular rate and rhythm now.  Agitation  PRN clonazepam, will try to avoid IV sedation.   Birth control  Mom stated that pt has a hormone depo for birth control  Not sure the name  Concerning both estrogen and progesterone containing  Pt not smoker but vaping - mom not sure if nicotine containing  MRV showed no evidence for cerebral sinus venous thrombosis.  COVID positive  Asymptomatic  Mom does not know any potential exposure  On  isolation through 4/25 (10 days)  Acute blood loss anemia  Hgb 9.6->8.8->8.1->7.0->7.6->8.7->9.4  Continue to monitor  Dysphagia, Feeding/Nutrition  Cortrak tube re-placed  On clear liquid diet but not eating much  TF Osmolite per protocol  Dietitian on board  Urinary Retention  Foley placed 4/18  On urecholine  Other Stroke Risk Factors  Vaping - ? Nicotine or non-nicotine (mom not sure)   Family hx stroke (Mom had a stroke)  Hypokalemia  improved  3.3->3.9->4.0  Other Active Problems    Hospital day # 8

## 2020-10-25 NOTE — Progress Notes (Signed)
PROGRESS NOTE                                                                                                                                                                                                             Patient Demographics:    Jane Martin, is a 25 y.o. female, DOB - 09/05/1995, DJS:[id number]  Outpatient Primary MD for the patient is Obgyn, Ma Hillock   Admit date - 10/17/2020   LOS - 8  Chief Complaint  Patient presents with  . Fall       Brief Narrative: Patient is a 25 y.o. female with no PMHx who collapsed at a local gas station-when EMS arrived-she was Jane Martin was intubated in the emergency room-CT head showed a ICH with midline shift-taken emergently to the OR-and subsequently managed in the neuro ICU.  COVID-19 vaccinated status: Not known  Significant Events: 4/15>> collapsed at a local gas station-intubated in ED-found to have ICH  4/15>> craniotomy and hematoma evacuation. 4/18>> extubated 4/22>> transfer to [facility]  Significant studies: 4/15>> CT head: 5.6 cm acute left frontoparietal intraparenchymal hemorrhage-extending into the third ventricle.  1 cm left to right midline shift. 4/15>> CT C-spine: No acute cervical spine findings. 4/15>> CTA head: No intracranial aneurysm-no large vessel occlusion 4/15>> CTA neck: No evidence of dissection/aneurysm-large vessels are patent 4/16>> UDS: Positive for tetrahydrocannabinol 4/16>> CT: Evacuated left hemisphere hematoma-drain in place-resolved intracranial mass-effect 4/17>> MRI brain: No mass/vascular lesion underlying evacuated left parietal hematoma 4/17>> MRV brain: No evidence of venous thrombosis 4/17>> Echo: EF 55-60% 4/18>> bilateral lower extremity Doppler: No DVT. 4/19>> CT head: No recurrent hemorrhage.  Decrease brain swelling/midline shift.   COVID-19 medications: None  Antibiotics: None  Microbiology  data: None  Procedures: 4/15>> craniotomy and hematoma evacuation.  Consults:  None  DVT prophylaxis: heparin injection 5,000 Units Start: 10/19/20 2200 SCDs Start: 10/17/20 2243     Subjective:    Jane Martin today was sleeping but easily awakes-answering simple questions.  Following simple commands.   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  Continues to have RLE>> RLE weakness.  Remains on Keppra & Tube feeds.  Will defer further work-up if necessary to neurology service.  Dysphagia: Secondary to above-NG tube feedings ongoing - also on  full liquid diet-SLP following.  Acute hypoxic respiratory failure: In setting of ICH-extubated on 4/18-on room air.  Asymptomatic bradycardia: While in the ICU-thought to be due to Precedex.  Monitor for now.  COVID-19 infection: Asymptomatic-unclear if she has been vaccinated.  Acute urinary retention: Foley catheter placed on 4/18-on Urecholine-we will attempt voiding trial over the next few days  Normocytic anemia: Suspect this is from acute illness-no evidence of blood loss from chart review.  Monitor for now.     Condition - Stable  Family Communication  : Mother Jane Martin [phone] 10/25/20 at 10:07 AM message left  Code Status :  Full Code  Diet :  Diet Order            Diet full liquid Room service appropriate? Yes; Fluid consistency: Thin  Diet effective now                  Disposition Plan  :  Status is: Inpatient  Remains inpatient appropriate because:Inpatient level of care appropriate due to severity of illness   Dispo: The patient is from: Home              Anticipated d/c is to: CIR              Patient currently is not medically stable to d/c.   Difficult to place patient No   Barriers to discharge: Dysphagia - NG tube in  place  Antimicorbials  :    Anti-infectives (From admission, onward)   None      Inpatient Medications  Scheduled Meds: .  stroke: mapping our early stages of recovery book   Does not apply Once  . bethanechol  10 mg Per Tube TID  . chlorhexidine  15 mL Mouth Rinse BID  . Chlorhexidine Gluconate Cloth  6 each Topical Daily  . docusate  100 mg Per Tube BID  . feeding supplement (PROSource TF)  45 mL Per Tube TID  . free water  250 mL Per Tube Q4H  . heparin injection (subcutaneous)  5,000 Units Subcutaneous Q8H  . levETIRAcetam  500 mg Oral BID  . mouth rinse  15 mL Mouth Rinse q12n4p  . polyethylene glycol  17 g Per Tube Daily  . sennosides  5 mL Per Tube BID  . sodium chloride flush  10-40 mL Intracatheter Q12H   Continuous Infusions: . feeding supplement (OSMOLITE 1.5 CAL) 1,000 mL (10/25/20 0355)   PRN Meds:.acetaminophen **OR** acetaminophen (TYLENOL) oral liquid 160 mg/5 mL **OR** acetaminophen, artificial tears, bisacodyl, clonazePAM, labetalol, ondansetron **OR** ondansetron (ZOFRAN) IV, polyethylene glycol, promethazine, sodium chloride flush, sodium phosphate   Time Spent in minutes  25  See all Orders from today for further details   Susa Raring M.D on 10/25/2020 at 9:55 AM  To page go to [email or web address] - use universal password  [facility] -  Office  [phone]    Objective:   Vitals:   10/24/20 1546 10/24/20 2116 10/25/20 0042 10/25/20 0353  BP: 138/82 116/71 134/83 117/79  Pulse: 97 85 89 84  Resp:  Temp: 98.9 F (37.2 C) 99.1 F (37.3 C) 98.5 F (36.9 C) 99.9 F (37.7 C)  TempSrc: Oral Oral Oral Oral  SpO2: 100% 98% 100% 100%  Weight:      Height:        Wt Readings from Last 3 Encounters:  10/17/20 72.6 kg  06/09/19 65.8 kg  09/05/18 70.4 kg    No intake or output data in the  24 hours ending 10/25/20 0955   Physical Exam  Awake, answers basic question, dense R. Sided weakness arm >> leg [place].AT,PERRAL Supple  Neck,No JVD, No cervical lymphadenopathy appriciated.  Symmetrical Chest wall movement, Good air movement bilaterally, CTAB RRR,No Gallops, Rubs or new Murmurs, No Parasternal Heave +ve B.Sounds, Abd Soft, No tenderness, No organomegaly appriciated, No rebound - guarding or rigidity. No Cyanosis, Clubbing or edema, No new Rash or bruise    Data Review:    CBC  Recent Labs  Lab 10/21/20 0520 10/22/20 0505 10/23/20 0500 10/24/20 0915 10/25/20 0512  WBC 6.8 6.3 6.8 10.9* 9.5  HGB 7.1* 7.6* 7.9* 9.4* 9.4*  HCT 22.1* 23.3* 23.7* 28.7* 28.6*  PLT 128* 162 194 246 252  MCV 95.7 94.0 92.9 92.0 92.6  MCH 30.7 30.6 31.0 30.1 30.4  MCHC 32.1 32.6 33.3 32.8 32.9  RDW 12.8 12.3 12.1 12.2 12.5    Chemistries  Recent Labs  Lab 10/19/20 1250 10/19/20 1752 10/20/20 0500 10/20/20 1200 10/20/20 1800 10/20/20 1820 10/21/20 0520 10/22/20 0505 10/23/20 0500 10/24/20 0418 10/25/20 0512  NA 146*   < > 148*   < >  --    < > 148* 147* 141 138 137  K  --   --  3.5  --   --   --  3.3* 3.2* 3.3* 3.9 4.0  CL  --   --  124*  --   --   --  118* 116* 110 105 101  CO2  --   --  19*  --   --   --  GLUCOSE  --   --  87  --   --   --  122* 93 150* 143* 121*  BUN  --   --  <5*  --   --   --  <5* 5* CREATININE  --   --  0.52  --   --   --  0.57 0.58 0.55 0.53 0.53  CALCIUM  --   --  8.2*  --   --   --  8.7* 8.9 8.7* 9.4 9.4  MG  --    < > 2.0  --  2.0  --  2.0 1.8  --  2.1  --   AST 20  --   --   --   --   --   --  30  --   --   --   ALT 10  --   --   --   --   --   --  19  --   --   --   ALKPHOS 47  --   --   --   --   --   --  51  --   --   --   BILITOT 0.5  --   --   --   --   --   --  0.7  --   --   --    < > = values in this interval not displayed.   ------------------------------------------------------------------------------------------------------------------ No results for input(s): CHOL, HDL, LDLCALC, TRIG, CHOLHDL, LDLDIRECT in the last 72 hours.  Lab Results   Component Value Date   HGBA1C 5.6 10/18/2020   ------------------------------------------------------------------------------------------------------------------ No results for input(s): TSH, T4TOTAL, T3FREE, THYROIDAB in the last 72 hours.  Invalid input(s): FREET3 ------------------------------------------------------------------------------------------------------------------ No results for input(s): VITAMINB12, FOLATE, FERRITIN, TIBC, IRON, RETICCTPCT in the last 72 hours.  Coagulation  profile Recent Labs  Lab 10/19/20 0525  INR 1.7*    No results for input(s): DDIMER in the last 72 hours.  Cardiac Enzymes No results for input(s): CKMB, TROPONINI, MYOGLOBIN in the last 168 hours.  Invalid input(s): CK ------------------------------------------------------------------------------------------------------------------ No results found for: BNP  Micro Results Recent Results (from the past 240 hour(s))  Resp Panel by RT-PCR (Flu A&B, Covid) Nasopharyngeal Swab     Status: Abnormal   Collection Time: 10/17/20  6:02 PM   Specimen: Nasopharyngeal Swab; Nasopharyngeal(NP) swabs in vial transport medium  Result Value Ref Range Status   SARS Coronavirus 2 by RT PCR POSITIVE (A) NEGATIVE Final    Comment: RESULT CALLED TO, READ BACK BY AND VERIFIED WITH: MARGE RN 1942 10/17/20 EB (NOTE) SARS-CoV-2 target nucleic acids are DETECTED.  The SARS-CoV-2 RNA is generally detectable in upper respiratory specimens during the acute phase of infection. Positive results are indicative of the presence of the identified virus, but do not rule out bacterial infection or co-infection with other pathogens not detected by the test. Clinical correlation with patient history and other diagnostic information is necessary to determine patient infection status. The expected result is Negative.  Fact Sheet for Patients: BloggerCourse.com  Fact Sheet for Healthcare  Providers: SeriousBroker.it  This test is not yet approved or cleared by the [place] FDA and  has been authorized for detection and/or diagnosis of SARS-CoV-2 by FDA under an Emergency Use Authorization (EUA).  This EUA will remain in effect (meaning this test can be used) fo r the duration of  the COVID-19 declaration under Section 564(b)(1) of the Act, 21 U.S.C. section 360bbb-3(b)(1), unless the authorization is terminated or revoked sooner.     Influenza A by PCR NEGATIVE NEGATIVE Final   Influenza B by PCR NEGATIVE NEGATIVE Final    Comment: (NOTE) The Xpert Xpress SARS-CoV-2/FLU/RSV plus assay is intended as an aid in the diagnosis of influenza from Nasopharyngeal swab specimens and should not be used as a sole basis for treatment. Nasal washings and aspirates are unacceptable for Xpert Xpress SARS-CoV-2/FLU/RSV testing.  Fact Sheet for Patients: BloggerCourse.com  Fact Sheet for Healthcare Providers: SeriousBroker.it  This test is not yet approved or cleared by the [place] FDA and has been authorized for detection and/or diagnosis of SARS-CoV-2 by FDA under an Emergency Use Authorization (EUA). This EUA will remain in effect (meaning this test can be used) for the duration of the COVID-19 declaration under Section 564(b)(1) of the Act, 21 U.S.C. section 360bbb-3(b)(1), unless the authorization is terminated or revoked.  Performed at [facility], [address], [place], [place] [address]   MRSA PCR Screening     Status: None   Collection Time: 10/18/20 10:29 AM   Specimen: Nasopharyngeal  Result Value Ref Range Status   MRSA by PCR NEGATIVE NEGATIVE Final    Comment:        The GeneXpert MRSA Assay (FDA approved for NASAL specimens only), is one component of a comprehensive MRSA colonization surveillance program. It is not intended to diagnose MRSA infection nor to  guide or monitor treatment for MRSA infections. Performed at [facility], [address] [place], [place] [address]     Radiology Reports CT HEAD WO CONTRAST  Result Date: 10/21/2020 CLINICAL DATA:  Follow-up intracranial hemorrhage EXAM: CT HEAD WITHOUT CONTRAST TECHNIQUE: Contiguous axial images were obtained from the base of the skull through the vertex without intravenous contrast. COMPARISON:  Three days ago FINDINGS: Brain: Evacuated  left cerebral hemorrhage with drain in place. No evidence of recurrent hemorrhage. Swelling and pneumocephalus is diminished. No progression of intraventricular clot in the left lateral ventricle. No hydrocephalus. Drain has been retracted so that the tip is in the region of hemorrhagic insult. Vascular: Negative Skull: Craniotomy with scalp swelling.  No interval change. Sinuses/Orbits: Mild generalized mucosal thickening. IMPRESSION: 1. No recurrent hemorrhage. Decreased brain swelling and midline shift. 2. No hydrocephalus. Electronically Signed   By: Marnee Spring M.D.   On: 10/21/2020 06:53   CT HEAD WO CONTRAST  Result Date: 10/18/2020 CLINICAL DATA:  25 year old female code stroke presentation with large left hemisphere hemorrhage, intraventricular extension. Posturing on presentation. Postoperative day 1 status post craniotomy and evacuation of intracranial hematoma. EXAM: CT HEAD WITHOUT CONTRAST TECHNIQUE: Contiguous axial images were obtained from the base of the skull through the vertex without intravenous contrast. COMPARISON:  CT head, CTA head and neck 10/17/2020. FINDINGS: Brain: Small volume left side pneumocephalus. Vertex approach drainage catheter tracking to the left corona radiata and internal capsule. Substantial interval drainage of intra-axial hemorrhage previously centered at the posterior left middle frontal gyrus, anterior parietal lobe. Small volume residual intra-axial blood, in an area of about 3.4 cm along the course of the  drainage catheter (series 3, image 28). Largely resolved regional mass effect. Largely resolved rightward midline shift. Trace extra-axial blood underlying the left craniotomy. Moderate volume left lateral intraventricular hemorrhage is stable. Small volume blood both the 3rd and 4th ventricles, more apparent. Right lateral ventricle appears spared. No ventriculomegaly. Improved basilar cisterns. Partial suprasellar cistern effacement persists. No uncal herniation. No superimposed acute cortically based infarct identified. Vascular: No suspicious intracranial vascular hyperdensity. Skull: Left superior convexity craniotomy. Otherwise stable and intact. Sinuses/Orbits: Right nasoenteric tube remains in place. Fluid in the visible pharynx. Mild new paranasal sinus fluid and mucosal thickening. Tympanic cavities and mastoids remain clear. Other: Postoperative left scalp hematoma, superimposed small volume left scalp fluid and gas collection. Overlying skin staples. Orbits soft tissues remain negative. IMPRESSION: 1. Significantly evacuated left hemisphere hemorrhage by surgery. Percutaneous drain remains in place tracking toward the left internal capsule. Small volume (roughly 3.4 cm diameter) residual intra-axial blood, fluid, and gas. 2. Largely resolved intracranial mass effect and midline shift. Residual partially effaced suprasellar cistern. 3. Stable to minimally increased intraventricular hemorrhage. No ventriculomegaly. 4. Trace extra-axial blood underlying the craniotomy. Small volume left-side pneumocephalus. Postoperative scalp hematoma. 5. No new intracranial abnormality identified. Electronically Signed   By: Odessa Fleming M.D.   On: 10/18/2020 05:54   CT HEAD WO CONTRAST  Result Date: 10/17/2020 CLINICAL DATA:  Patient is unresponsive and with abnormal posture EXAM: CT HEAD WITHOUT CONTRAST CT CERVICAL SPINE WITHOUT CONTRAST TECHNIQUE: Multidetector CT imaging of the head and cervical spine was performed  following the standard protocol without intravenous contrast. Multiplanar CT image reconstructions of the cervical spine were also generated. COMPARISON:  None. FINDINGS: CT HEAD FINDINGS Brain: 5.6 by 3.8 by 4.4 cm (volume = 49 cm^3) acute left frontoparietal intraparenchymal hematoma is noted. This is decompressed into the left lateral ventricle which likewise contains a substantial amount of acute blood products which also extend into the third ventricle. There is approximately 1.0 cm of left-to-right midline shift. Borderline accentuated density along the tentorium and falx but overall I am skeptical that this represents subarachnoid hemorrhage. Effacement of the basilar cisterns including the suprasellar cistern and ambient cisterns. Vascular: Unremarkable Skull: Unremarkable Sinuses/Orbits: Mild chronic right maxillary sinusitis. CT CERVICAL SPINE FINDINGS Alignment: Reversal  the normal cervical lordosis but no subluxation. Skull base and vertebrae: No fracture or acute bony findings. Soft tissues and spinal canal: Unremarkable Disc levels:  No bony impingement. Upper chest: Unremarkable Other: Endotracheal and nasogastric tubes noted. IMPRESSION: 1. 5.6 cm in long axis acute left frontoparietal intraparenchymal hematoma, decompressing into the left lateral ventricle which contains a substantial amount of acute blood products which extend into the third ventricle. 1.0 cm of left-to-right midline shift with effacement of the basilar cisterns. 2. No acute cervical spine findings. 3. Chronic right maxillary sinusitis. Critical Value/emergent results were called by telephone at the time of interpretation on 10/17/2020 at 6:10 pm to provider Dr. Margarita Grizzle , who verbally acknowledged these results. Electronically Signed   By: Gaylyn Rong M.D.   On: 10/17/2020 18:19   CT Cervical Spine Wo Contrast  Result Date: 10/17/2020 CLINICAL DATA:  Patient is unresponsive and with abnormal posture EXAM: CT HEAD  WITHOUT CONTRAST CT CERVICAL SPINE WITHOUT CONTRAST TECHNIQUE: Multidetector CT imaging of the head and cervical spine was performed following the standard protocol without intravenous contrast. Multiplanar CT image reconstructions of the cervical spine were also generated. COMPARISON:  None. FINDINGS: CT HEAD FINDINGS Brain: 5.6 by 3.8 by 4.4 cm (volume = 49 cm^3) acute left frontoparietal intraparenchymal hematoma is noted. This is decompressed into the left lateral ventricle which likewise contains a substantial amount of acute blood products which also extend into the third ventricle. There is approximately 1.0 cm of left-to-right midline shift. Borderline accentuated density along the tentorium and falx but overall I am skeptical that this represents subarachnoid hemorrhage. Effacement of the basilar cisterns including the suprasellar cistern and ambient cisterns. Vascular: Unremarkable Skull: Unremarkable Sinuses/Orbits: Mild chronic right maxillary sinusitis. CT CERVICAL SPINE FINDINGS Alignment: Reversal the normal cervical lordosis but no subluxation. Skull base and vertebrae: No fracture or acute bony findings. Soft tissues and spinal canal: Unremarkable Disc levels:  No bony impingement. Upper chest: Unremarkable Other: Endotracheal and nasogastric tubes noted. IMPRESSION: 1. 5.6 cm in long axis acute left frontoparietal intraparenchymal hematoma, decompressing into the left lateral ventricle which contains a substantial amount of acute blood products which extend into the third ventricle. 1.0 cm of left-to-right midline shift with effacement of the basilar cisterns. 2. No acute cervical spine findings. 3. Chronic right maxillary sinusitis. Critical Value/emergent results were called by telephone at the time of interpretation on 10/17/2020 at 6:10 pm to provider Dr. Margarita Grizzle , who verbally acknowledged these results. Electronically Signed   By: Gaylyn Rong M.D.   On: 10/17/2020 18:19   MR  BRAIN W WO CONTRAST  Result Date: 10/19/2020 CLINICAL DATA:  Cerebral hemorrhage suspected EXAM: MRI HEAD WITHOUT AND WITH CONTRAST MRA HEAD WITHOUT AND WITH CONTRAST TECHNIQUE: Multiplanar, multiecho pulse sequences of the brain and surrounding structures were obtained without and with intravenous contrast. Angiographic images of the head were obtained using MRV technique without and with contrast. CONTRAST:  7.56mL GADAVIST GADOBUTROL 1 MMOL/ML IV SOLN COMPARISON:  Head CT from yesterday. CTA of the head neck from 2 days ago FINDINGS: MRI HEAD FINDINGS Brain: Evacuated left parietal hematoma with expected remaining fluid and blood products. Pneumocephalus has significantly decreased. Midline shift is resolved although there is still signs of elevated intracranial pressure with diffuse effacement of CSF spaces. Intraventricular extension of blood clot. Left-sided drain which traverses the hematoma, tip near the left cerebral peduncle. The catheter does not directly communicate with the ventricular system which is nondilated. No masslike enhancement is seen  adjacent to the hemorrhage. There is cortical restricted diffusion about the operative cavity. Accentuated vessels along the superficial aspect of the hematoma cavity, usually reactive. Vascular: As above.  Enhancing dural venous sinuses. Skull and upper cervical spine: Unremarkable craniotomy. Sinuses/Orbits: Negative MRV HEAD FINDINGS No dural venous sinus thrombosis is seen. Narrow right transverse and sigmoid sinuses attributed to developmental variation. No evidence of cortical vein thrombosis or shunting. IMPRESSION: Brain MRI: 1. No mass or definite vascular lesion underlying the evacuated left parietal hematoma. There is accentuated intravascular enhancement along the superficial cavity which may be reactive; catheter angiography would be contributory. 2. Resolved midline shift. No hydrocephalus. MRV: No evidence for venous thrombosis. Electronically  Signed   By: Marnee Spring M.D.   On: 10/19/2020 05:50   DG Chest Portable 1 View  Result Date: 10/17/2020 CLINICAL DATA:  ET tube check EXAM: PORTABLE CHEST 1 VIEW COMPARISON:  Chest radiograph 10/18/2018 at 5:52 p.m. FINDINGS: Interval retraction of the endotracheal tube with tip approximately 1.5 cm above the carina. Enteric catheter in place. Stable cardiomediastinal contours. Low lung volumes but otherwise clear. No pneumothorax or pleural effusion. No acute finding in the visualized skeleton. IMPRESSION: Interval retraction of the endotracheal tube with tip approximately 1.5 cm above the carina. Electronically Signed   By: Emmaline Kluver M.D.   On: 10/17/2020 19:06   DG Chest Port 1 View  Result Date: 10/17/2020 CLINICAL DATA:  Unresponsive. EXAM: PORTABLE CHEST 1 VIEW COMPARISON:  None. FINDINGS: Single frontal view of the chest demonstrates endotracheal tube overlying tracheal air column, tip overlying the right mainstem bronchus. Recommend retracting endotracheal tube approximately 4 cm to ensure adequate placement above the carina. Enteric catheter passes below diaphragm tip and side port projecting over gastric fundus. Cardiac silhouette is unremarkable. No airspace disease, effusion, or pneumothorax. There are no acute bony abnormalities. IMPRESSION: 1. Right mainstem intubation. Recommend retracting endotracheal tube approximately 4 cm to ensure adequate placement above carina. 2. No acute intrathoracic process. Critical Value/emergent results were called by telephone at the time of interpretation on 10/17/2020 at 6:09 pm to provider [facility] RAY , who verbally acknowledged these results. Electronically Signed   By: Sharlet Salina M.D.   On: 10/17/2020 18:09   DG Abd Portable 1V  Result Date: 10/18/2020 CLINICAL DATA:  Status post nasogastric tube placement EXAM: PORTABLE ABDOMEN - 1 VIEW COMPARISON:  None. FINDINGS: Nasogastric tube appears adequately positioned in the stomach with tip  directed towards the stomach fundus. IMPRESSION: Nasogastric tube adequately positioned in the stomach. Electronically Signed   By: Bary Richard M.D.   On: 10/18/2020 12:36   MR MRV HEAD W WO CONTRAST  Result Date: 10/19/2020 CLINICAL DATA:  Cerebral hemorrhage suspected EXAM: MRI HEAD WITHOUT AND WITH CONTRAST MRA HEAD WITHOUT AND WITH CONTRAST TECHNIQUE: Multiplanar, multiecho pulse sequences of the brain and surrounding structures were obtained without and with intravenous contrast. Angiographic images of the head were obtained using MRV technique without and with contrast. CONTRAST:  7.55mL GADAVIST GADOBUTROL 1 MMOL/ML IV SOLN COMPARISON:  Head CT from yesterday. CTA of the head neck from 2 days ago FINDINGS: MRI HEAD FINDINGS Brain: Evacuated left parietal hematoma with expected remaining fluid and blood products. Pneumocephalus has significantly decreased. Midline shift is resolved although there is still signs of elevated intracranial pressure with diffuse effacement of CSF spaces. Intraventricular extension of blood clot. Left-sided drain which traverses the hematoma, tip near the left cerebral peduncle. The catheter does not directly communicate with the ventricular system which  is nondilated. No masslike enhancement is seen adjacent to the hemorrhage. There is cortical restricted diffusion about the operative cavity. Accentuated vessels along the superficial aspect of the hematoma cavity, usually reactive. Vascular: As above.  Enhancing dural venous sinuses. Skull and upper cervical spine: Unremarkable craniotomy. Sinuses/Orbits: Negative MRV HEAD FINDINGS No dural venous sinus thrombosis is seen. Narrow right transverse and sigmoid sinuses attributed to developmental variation. No evidence of cortical vein thrombosis or shunting. IMPRESSION: Brain MRI: 1. No mass or definite vascular lesion underlying the evacuated left parietal hematoma. There is accentuated intravascular enhancement along the  superficial cavity which may be reactive; catheter angiography would be contributory. 2. Resolved midline shift. No hydrocephalus. MRV: No evidence for venous thrombosis. Electronically Signed   By: Marnee Spring M.D.   On: 10/19/2020 05:50   ECHOCARDIOGRAM COMPLETE  Result Date: 10/20/2020    ECHOCARDIOGRAM REPORT   Patient Name:   CHARLE MCLAURIN Date of Exam: 10/19/2020 Medical Rec #:  [id number]        Height:       68.0 in Accession #:    [id number]       Weight:       160.0 lb Date of Birth:  1996/04/20        BSA:          1.859 m Patient Age:    24 years         BP:           100/72 mmHg Patient Gender: F                HR:           71 bpm. Exam Location:  Inpatient Procedure: 2D Echo, Cardiac Doppler and Color Doppler Indications:    Stroke I63.9  History:        Patient has no prior history of Echocardiogram examinations.                 Covid 19 positive.  Sonographer:    Roosvelt Maser RDCS Referring Phys: [id number] IMPRESSIONS  1. Left ventricular ejection fraction, by estimation, is 55 to 60%. The left ventricle has normal function. The left ventricle has no regional wall motion abnormalities. Left ventricular diastolic function could not be evaluated.  2. Right ventricular systolic function is normal. The right ventricular size is normal.  3. The mitral valve is normal in structure. No evidence of mitral valve regurgitation.  4. The aortic valve is normal in structure. Aortic valve regurgitation is not visualized. Comparison(s): No prior Echocardiogram. Conclusion(s)/Recommendation(s): Otherwise normal echocardiogram, with minor abnormalities described in the report. FINDINGS  Left Ventricle: Left ventricular ejection fraction, by estimation, is 55 to 60%. The left ventricle has normal function. The left ventricle has no regional wall motion abnormalities. The left ventricular internal cavity size was normal in size. There is  no left ventricular hypertrophy. Left ventricular diastolic  function could not be evaluated. Right Ventricle: The right ventricular size is normal. No increase in right ventricular wall thickness. Right ventricular systolic function is normal. Left Atrium: Left atrial size was normal in size. Right Atrium: Right atrial size was normal in size. Pericardium: There is no evidence of pericardial effusion. Mitral Valve: The mitral valve is normal in structure. No evidence of mitral valve regurgitation. Tricuspid Valve: The tricuspid valve is normal in structure. Tricuspid valve regurgitation is not demonstrated. Aortic Valve: The aortic valve is normal in structure. Aortic valve regurgitation is not visualized. Pulmonic Valve:  The pulmonic valve was not assessed. Pulmonic valve regurgitation is not visualized. Aorta: The aortic root is normal in size and structure. IAS/Shunts: The atrial septum is grossly normal. Riley Lam MD Electronically signed by Riley Lam MD Signature Date/Time: 10/20/2020/12:04:00 PM    Final    VAS [place] LOWER EXTREMITY VENOUS (DVT)  Result Date: 10/21/2020  Lower Venous DVT Study Indications: Stroke.  Risk Factors: ICH, Covid+. Comparison Study: No previous exams Performing Technologist: Ernestene Mention  Examination Guidelines: A complete evaluation includes B-mode imaging, spectral Doppler, color Doppler, and power Doppler as needed of all accessible portions of each vessel. Bilateral testing is considered an integral part of a complete examination. Limited examinations for reoccurring indications may be performed as noted. The reflux portion of the exam is performed with the patient in reverse Trendelenburg.  +---------+---------------+---------+-----------+----------+--------------+ RIGHT    CompressibilityPhasicitySpontaneityPropertiesThrombus Aging +---------+---------------+---------+-----------+----------+--------------+ CFV      Full           Yes      Yes                                  +---------+---------------+---------+-----------+----------+--------------+ SFJ      Full                                                        +---------+---------------+---------+-----------+----------+--------------+ FV Prox  Full           Yes      Yes                                 +---------+---------------+---------+-----------+----------+--------------+ FV Mid   Full           Yes      Yes                                 +---------+---------------+---------+-----------+----------+--------------+ FV DistalFull           Yes      Yes                                 +---------+---------------+---------+-----------+----------+--------------+ PFV      Full                                                        +---------+---------------+---------+-----------+----------+--------------+ POP      Full           Yes      Yes                                 +---------+---------------+---------+-----------+----------+--------------+ PTV      Full                                                        +---------+---------------+---------+-----------+----------+--------------+  PERO     Full                                                        +---------+---------------+---------+-----------+----------+--------------+   +---------+---------------+---------+-----------+----------+--------------+ LEFT     CompressibilityPhasicitySpontaneityPropertiesThrombus Aging +---------+---------------+---------+-----------+----------+--------------+ CFV      Full           Yes      Yes                                 +---------+---------------+---------+-----------+----------+--------------+ SFJ      Full                                                        +---------+---------------+---------+-----------+----------+--------------+ FV Prox  Full           Yes      Yes                                  +---------+---------------+---------+-----------+----------+--------------+ FV Mid   Full           Yes      Yes                                 +---------+---------------+---------+-----------+----------+--------------+ FV DistalFull           Yes      Yes                                 +---------+---------------+---------+-----------+----------+--------------+ PFV      Full                                                        +---------+---------------+---------+-----------+----------+--------------+ POP      Full           Yes      Yes                                 +---------+---------------+---------+-----------+----------+--------------+ PTV      Full                                                        +---------+---------------+---------+-----------+----------+--------------+ PERO     Full                                                        +---------+---------------+---------+-----------+----------+--------------+  Summary: BILATERAL: - No evidence of deep vein thrombosis seen in the lower extremities, bilaterally. - No evidence of superficial venous thrombosis in the lower extremities, bilaterally. -No evidence of popliteal cyst, bilaterally.   *See table(s) above for measurements and observations. Electronically signed by Gretta Beganodd Early MD on 10/21/2020 at 5:10:53 PM.    Final    US EKG SITE RITE  Result Date: 10/19/2020 If Site Rite image not attached, placement could not be confirmed due to current cardiac rhythm.  CT ANGIO HEAD NECK W WO CM (CODE STROKE)  Result Date: 10/17/2020 CLINICAL DATA:  Patient unresponsive with abnormal posture. EXAM: CT HEAD WITHOUT CONTRAST CT ANGIOGRAPHY HEAD AND NECK TECHNIQUE: Multidetector CT imaging of the head and neck was performed using the standard protocol during bolus administration of intravenous contrast. Multiplanar CT image reconstructions and MIPs were obtained to evaluate the vascular anatomy. Carotid  stenosis measurements (when applicable) are obtained utilizing NASCET criteria, using the distal internal carotid diameter as the denominator. CONTRAST:  75mL OMNIPAQUE IOHEXOL 350 MG/ML SOLN COMPARISON:  CT head/cervical spine performed earlier today 10/17/2020. FINDINGS: CTA NECK FINDINGS Aortic arch: Standard aortic branching. The visualized aortic arch is normal in caliber. No hemodynamically significant innominate or proximal subclavian artery stenosis. Right carotid system: CCA and ICA patent within the neck without stenosis. Left carotid system: CCA and ICA patent within the neck without stenosis. Vertebral arteries: The intracranial vertebral arteries are patent. Skeleton: No acute bony abnormality or aggressive osseous lesion. Other neck: No neck mass or cervical lymphadenopathy. Upper chest: No consolidation within the imaged lung apices. Partially imaged ET tube extending into the right mainstem bronchus. Partially imaged enteric tube. Review of the MIP images confirms the above findings CTA HEAD FINDINGS Anterior circulation: The intracranial internal carotid arteries are patent. The M1 middle cerebral arteries are patent. No M2 proximal branch occlusion or high-grade proximal stenosis is identified. The anterior cerebral arteries are patent. No intracranial aneurysm is identified. No evidence of AVM. No contrast blush at the site of the patient's left cerebral hemisphere acute parenchymal hematoma. Posterior circulation: The intracranial vertebral arteries are patent. The basilar artery is patent. The posterior cerebral arteries are patent. Venous sinuses: The dural venous sinuses are poorly assessed due to contrast bolus timing. Anatomic variants: None significant Review of the MIP images confirms the above findings Findings of right mainstem bronchus intubation called by telephone at the time of interpretation on 10/17/2020 at 6:31 pm to [facility] , who verbally acknowledged these results.  IMPRESSION: CTA neck: 1. The common carotid, internal carotid and vertebral arteries are patent within the neck without stenosis. No evidence of dissection or aneurysm. 2. Right mainstem intubation. CTA head: 1. No intracranial aneurysm or AVM is identified. However, a contrast-enhanced brain MRI and MR angiography should be considered once the hematoma involute to further evaluate for any underlying lesion. 2. No contrast blush in the region of the left cerebral hemisphere acute parenchymal hematoma. 3. No intracranial large vessel occlusion or proximal high-grade arterial stenosis. Electronically Signed   By: Jackey LogeKyle  Golden DO   On: 10/17/2020 18:39

## 2020-10-26 DIAGNOSIS — I611 Nontraumatic intracerebral hemorrhage in hemisphere, cortical: Secondary | ICD-10-CM | POA: Diagnosis not present

## 2020-10-26 LAB — CBC WITH DIFFERENTIAL/PLATELET
Abs Immature Granulocytes: 0.06 10*3/uL (ref 0.00–0.07)
Basophils Absolute: 0 10*3/uL (ref 0.0–0.1)
Basophils Relative: 0 %
Eosinophils Absolute: 0.1 10*3/uL (ref 0.0–0.5)
Eosinophils Relative: 1 %
HCT: 29.1 % — ABNORMAL LOW (ref 36.0–46.0)
Hemoglobin: 9.7 g/dL — ABNORMAL LOW (ref 12.0–15.0)
Immature Granulocytes: 1 %
Lymphocytes Relative: 31 %
Lymphs Abs: 2.7 10*3/uL (ref 0.7–4.0)
MCH: 30.5 pg (ref 26.0–34.0)
MCHC: 33.3 g/dL (ref 30.0–36.0)
MCV: 91.5 fL (ref 80.0–100.0)
Monocytes Absolute: 1.4 10*3/uL — ABNORMAL HIGH (ref 0.1–1.0)
Monocytes Relative: 16 %
Neutro Abs: 4.3 10*3/uL (ref 1.7–7.7)
Neutrophils Relative %: 51 %
Platelets: 296 10*3/uL (ref 150–400)
RBC: 3.18 MIL/uL — ABNORMAL LOW (ref 3.87–5.11)
RDW: 12.3 % (ref 11.5–15.5)
WBC: 8.6 10*3/uL (ref 4.0–10.5)
nRBC: 0 % (ref 0.0–0.2)

## 2020-10-26 LAB — COMPREHENSIVE METABOLIC PANEL
ALT: 115 U/L — ABNORMAL HIGH (ref 0–44)
AST: 48 U/L — ABNORMAL HIGH (ref 15–41)
Albumin: 3.5 g/dL (ref 3.5–5.0)
Alkaline Phosphatase: 81 U/L (ref 38–126)
Anion gap: 7 (ref 5–15)
BUN: 10 mg/dL (ref 6–20)
CO2: 29 mmol/L (ref 22–32)
Calcium: 9.7 mg/dL (ref 8.9–10.3)
Chloride: 100 mmol/L (ref 98–111)
Creatinine, Ser: 0.53 mg/dL (ref 0.44–1.00)
GFR, Estimated: >60 mL/min (ref >60)
Glucose, Bld: 116 mg/dL — ABNORMAL HIGH (ref 70–99)
Potassium: 4.1 mmol/L (ref 3.5–5.1)
Sodium: 136 mmol/L (ref 135–145)
Total Bilirubin: 0.5 mg/dL (ref 0.3–1.2)
Total Protein: 7.7 g/dL (ref 6.5–8.1)

## 2020-10-26 LAB — GLUCOSE, CAPILLARY
Glucose-Capillary: 105 mg/dL — ABNORMAL HIGH (ref 70–99)
Glucose-Capillary: 106 mg/dL — ABNORMAL HIGH (ref 70–99)
Glucose-Capillary: 115 mg/dL — ABNORMAL HIGH (ref 70–99)
Glucose-Capillary: 127 mg/dL — ABNORMAL HIGH (ref 70–99)
Glucose-Capillary: 95 mg/dL (ref 70–99)

## 2020-10-26 LAB — MAGNESIUM: Magnesium: 2.1 mg/dL (ref 1.7–2.4)

## 2020-10-26 MED ORDER — TAMSULOSIN HCL 0.4 MG PO CAPS
0.4000 mg | ORAL_CAPSULE | Freq: Every day | ORAL | Status: DC
  Administered 2020-10-26 – 2020-10-30 (×5): 0.4 mg via ORAL
  Filled 2020-10-26 (×5): qty 1

## 2020-10-26 NOTE — Progress Notes (Signed)
Pt ate 50% of her lunch. Still waiting on dinner to arrive, cafeteria called again. Pt also had 8oz of grape juice. Pt is hungry and asking for food and fluids. Pt take's her pills whole and chugs water with very little spillage. Latter most likely due to speed of ingestion rather than disability. Consider advancing her diet to whole foods.

## 2020-10-26 NOTE — Progress Notes (Signed)
STROKE TEAM PROGRESS NOTE   SUBJECTIVE (INTERVAL HISTORY) No family is at the bedsid.  Much more alert and responsive today.  Plan for cerebral angiogram once off isolation for COVID.   OBJECTIVE Vitals:   10/25/20 2117 10/25/20 2324 10/26/20 0456 10/26/20 0732  BP: 117/84 105/67 126/79 125/76  Pulse: 91 88 (!) 105 (!) 101  Resp:  17 18 17   Temp: 97.9 F (36.6 C) 98.5 F (36.9 C) 99.6 F (37.6 C) 97.9 F (36.6 C)  TempSrc: Oral Oral Oral Oral  SpO2: 99% 100% 100% 100%  Weight:      Height:       CBC:  Recent Labs  Lab 10/25/20 0512 10/26/20 0228  WBC 9.5 8.6  NEUTROABS  --  4.3  HGB 9.4* 9.7*  HCT 28.6* 29.1*  MCV 92.6 91.5  PLT 252 296   Basic Metabolic Panel:  Recent Labs  Lab 10/21/20 0520 10/22/20 0505 10/24/20 0418 10/25/20 0512 10/26/20 0228  NA 148*   < > 138 137 136  K 3.3*   < > 3.9 4.0 4.1  CL 118*   < > 105 101 100  CO2 25   < > 24 28 29   GLUCOSE 122*   < > 143* 121* 116*  BUN <5*   < > 6 8 10   CREATININE 0.57   < > 0.53 0.53 0.53  CALCIUM 8.7*   < > 9.4 9.4 9.7  MG 2.0   < > 2.1  --  2.1  PHOS 3.1  --  3.3  --   --    < > = values in this interval not displayed.   Lipid Panel:  No results for input(s): CHOL, TRIG, HDL, CHOLHDL, VLDL, LDLCALC in the last 168 hours. HgbA1c:  Lab Results  Component Value Date   HGBA1C 5.6 10/18/2020   Urine Drug Screen:     Component Value Date/Time   LABOPIA NONE DETECTED 10/18/2020 0030   COCAINSCRNUR NONE DETECTED 10/18/2020 0030   LABBENZ NONE DETECTED 10/18/2020 0030   AMPHETMU NONE DETECTED 10/18/2020 0030   THCU POSITIVE (A) 10/18/2020 0030   LABBARB NONE DETECTED 10/18/2020 0030    Alcohol Level     Component Value Date/Time   ETH <10 10/17/2020 1740   IMAGING CT head 4/15 left frontoparietal intraparenchymal hematoma, with IVH. 1.0 cm of left-to-right midline shift with effacement of the basilar cisterns  CTA Head and neck  4/15 Unremarkable, no AVM or aneurysm  Brain MRI 4/17 No  mass or definite vascular lesion underlying the evacuated left parietal hematoma. There is accentuated intravascular enhancement along the superficial cavity which may be reactive; catheter angiography would be contributory. Resolved midline shift. No hydrocephalus  MRV 4/17 No evidence for venous thrombosis    Repeat CT head 4/19 No recurrent hemorrhage. Decreased brain swelling and midline shift. No hydrocephalus.  2D Echo 1. Left ventricular ejection fraction, by estimation, is 55 to 60%. The  left ventricle has normal function. The left ventricle has no regional  wall motion abnormalities. Left ventricular diastolic function could not  be evaluated.  2. Right ventricular systolic function is normal. The right ventricular  size is normal.  3. The mitral valve is normal in structure. No evidence of mitral valve  regurgitation.  4. The aortic valve is normal in structure. Aortic valve regurgitation is not visualized.   PHYSICAL EXAM  Temp:  [97.9 F (36.6 C)-99.6 F (37.6 C)] 97.9 F (36.6 C) (04/24 0732) Pulse Rate:  [88-105] 101 (  04/24 0732) Resp:  [17-18] 17 (04/24 0732) BP: (105-126)/(67-84) 125/76 (04/24 0732) SpO2:  [99 %-100 %] 100 % (04/24 0732)  General - Well nourished, well developed, in no apparent distress.  Ophthalmologic - fundi not visualized due to noncooperation.  Cardiovascular - Regular rhythm and rate.  Neuro -she is awake and alert lying in bed today and immediately says hi how are you doing.  She follows commands briskly.  She is asking repeatedly to leave the hospital and go home.  She clearly still has moderate global aphasia.  She was able to name 4/5 objects at the bedside today although some prompting is noted and she did have paraphasic errors.  Extraocular movements are full.  Facial muscles are symmetric.  She has 4+/5 strength throughout except right upper extremity which she is 4 -.   She does have a significant drift right upper extremity but  no drift of the other extremities.    ASSESSMENT/PLAN Ms. REI CONTEE is a 25 y.o. female with no history, presented to ED by EMS after collapsing at a gas station. It was told that she yelled out for help and for someone to call 911, then collapsed to the ground. When EMS arrived, she was unresponsive which worsened to comatose. Intubated in ED.    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 Etiology unknown.  Repeat HCT stable 4/19  On Keppra s/p craniotomy   VTE prophylaxis - heparin SQ  No antithrombotic prior to admission, now on No antithrombotic  Therapy recommendations:  CIR  Disposition:  TBD  Cerebral edema  CT and MRI post hematoma evaluation showed resolved midline shift  3% saline weaned to off   Na 146-153-148->149->147->141->137  ? Abnormal brain tissue  Per Dr. Danielle Dess path report showed abnormal brain tissue.   Pathology negative so far  MRI - there is accentuated intravascular enhancement along the superficial cavity which may be reactive; catheter angiography may be considered when patient is more stable.  Will consider cerebral angiogram with Dr. Corliss Skains next week after COVID isolation lifted.  Respiratory failure  Extubated 10/20/20  Tolerating well, on room air  ?? Hypertension . BP goal < 160 . BP stable   Asymptomatic bradycardia  Seemed to worsen with precedex, precedex off, now resolved  HR regular rate and rhythm now.  Agitation  PRN clonazepam, will try to avoid IV sedation.   Birth control  Mom stated that pt has a hormone depo for birth control  Not sure the name  Concerning both estrogen and progesterone containing  Pt not smoker but vaping - mom not sure if nicotine containing  MRV showed no evidence for cerebral sinus venous thrombosis.  COVID positive  Asymptomatic  Mom does not know any potential exposure  On isolation through 4/25 (10 days)  Acute blood  loss anemia  Hgb 9.6->8.8->8.1->7.0->7.6->8.7->9.4  Continue to monitor  Dysphagia, Feeding/Nutrition  Cortrak tube re-placed  On clear liquid diet but not eating much  TF Osmolite per protocol  Dietitian on board  Urinary Retention  Foley placed 4/18  On urecholine  Other Stroke Risk Factors  Vaping - ? Nicotine or non-nicotine (mom not sure)   Family hx stroke (Mom had a stroke)  Hypokalemia  improved  3.3->3.9->4.0  Other Active Problems    Hospital day # 9

## 2020-10-26 NOTE — Progress Notes (Addendum)
PROGRESS NOTE                                                                                                                                                                                                             Patient Demographics:    Jane Martin, is a 25 y.o. female, DOB - [birth year], CNO:[id number]  Outpatient Primary MD for the patient is Obgyn, Ma Hillock   Admit date - 10/17/2020   LOS - 9  Chief Complaint  Patient presents with  . Fall       Brief Narrative: Patient is a 25 y.o. female with no PMHx who collapsed at a local gas station-when EMS arrived-she was [name] was intubated in the emergency room-CT head showed a ICH with midline shift-taken emergently to the OR-and subsequently managed in the neuro ICU.  COVID-19 vaccinated status: Not known  Significant Events: 4/15>> collapsed at a local gas station-intubated in ED-found to have ICH  4/15>> craniotomy and hematoma evacuation. 4/18>> extubated 4/22>> transfer to [facility]  Significant studies: 4/15>> CT head: 5.6 cm acute left frontoparietal intraparenchymal hemorrhage-extending into the third ventricle.  1 cm left to right midline shift. 4/15>> CT C-spine: No acute cervical spine findings. 4/15>> CTA head: No intracranial aneurysm-no large vessel occlusion 4/15>> CTA neck: No evidence of dissection/aneurysm-large vessels are patent 4/16>> UDS: Positive for tetrahydrocannabinol 4/16>> CT: Evacuated left hemisphere hematoma-drain in place-resolved intracranial mass-effect 4/17>> MRI brain: No mass/vascular lesion underlying evacuated left parietal hematoma 4/17>> MRV brain: No evidence of venous thrombosis 4/17>> Echo: EF 55-60% 4/18>> bilateral lower extremity Doppler: No DVT. 4/19>> CT head: No recurrent hemorrhage.  Decrease brain swelling/midline shift.   COVID-19 medications: None  Antibiotics: None  Microbiology  data: None  Procedures: 4/15>> craniotomy and hematoma evacuation.  Consults:  None  DVT prophylaxis: heparin injection 5,000 Units Start: 10/19/20 2200 SCDs Start: 10/17/20 2243     Subjective:    Jane Martin today was sleeping but easily awakes-answering simple questions.  Following simple commands.   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  Continues to have RLE>> RLE weakness.  Remains on Keppra & Tube feeds.  Will defer further work-up if necessary to neurology service.  May require cerebral angiogram by Dr. Corliss Skains next week per  neurology.  Dysphagia: Speech following currently on NG feeds and full liquid diet, will hold tube feeds on 10/26/2020 and monitor her oral intake.  Acute hypoxic respiratory failure: In setting of ICH-extubated on 4/18-on room air.  Asymptomatic bradycardia: While in the ICU-thought to be due to Precedex.  Monitor for now.  COVID-19 infection: Asymptomatic-unclear if she has been vaccinated.  Finishes her isolation on 10/26/2020.  Acute urinary retention: Foley catheter placed on 4/18-on Urecholine-Foley catheter removed on 10/25/2020 Will monitor postvoid residual.  Normocytic anemia: Suspect this is from acute illness-no evidence of blood loss from chart review.  Monitor for now.     Condition - Stable  Family Communication  :   Mother Wilnette Kales [phone] 10/25/20 at 10:07 AM message left, message left again on 10/26/2020 at 9:49 AM   Father Ree Kida - [phone] - on 10/26/20   Code Status :  Full Code  Diet :  Diet Order            Diet full liquid Room service appropriate? Yes; Fluid consistency: Thin  Diet effective now                  Disposition Plan  :  Status is: Inpatient  Remains inpatient appropriate because:Inpatient level of care appropriate due to severity  of illness   Dispo: The patient is from: Home              Anticipated d/c is to: CIR              Patient currently is not medically stable to d/c.   Difficult to place patient No   Barriers to discharge: Dysphagia - NG tube in place  Antimicorbials  :    Anti-infectives (From admission, onward)   None      Inpatient Medications  Scheduled Meds: .  stroke: mapping our early stages of recovery book   Does not apply Once  . bethanechol  10 mg Per Tube TID  . chlorhexidine  15 mL Mouth Rinse BID  . Chlorhexidine Gluconate Cloth  6 each Topical Daily  . docusate  100 mg Per Tube BID  . feeding supplement (PROSource TF)  45 mL Per Tube TID  . free water  250 mL Per Tube Q4H  . heparin injection (subcutaneous)  5,000 Units Subcutaneous Q8H  . levETIRAcetam  500 mg Oral BID  . mouth rinse  15 mL Mouth Rinse q12n4p  . polyethylene glycol  17 g Per Tube Daily  . sennosides  5 mL Per Tube BID  . sodium chloride flush  10-40 mL Intracatheter Q12H   Continuous Infusions: . feeding supplement (OSMOLITE 1.5 CAL) 1,000 mL (10/26/20 0516)   PRN Meds:.acetaminophen **OR** acetaminophen (TYLENOL) oral liquid 160 mg/5 mL **OR** acetaminophen, artificial tears, bisacodyl, clonazePAM, labetalol, ondansetron **OR** ondansetron (ZOFRAN) IV, polyethylene glycol, promethazine, sodium chloride flush, sodium phosphate   Time Spent in minutes  25  See all Orders from today for further details   Susa Raring M.D on 10/26/2020 at 9:44 AM  To page go to [email or web address] - use universal password  [facility] -  Office  [phone]    Objective:   Vitals:   10/25/20 2117 10/25/20 2324 10/26/20 0456 10/26/20 0732  BP: 117/84 105/67 126/79 125/76  Pulse: 91 88 (!) 105 (!) 101  Resp:  Temp: 97.9 F (36.6 C) 98.5 F (36.9 C) 99.6 F (37.6 C) 97.9 F (36.6 C)  TempSrc: Oral Oral Oral Oral  SpO2:  99% 100% 100% 100%  Weight:      Height:        Wt Readings from Last 3  Encounters:  10/17/20 72.6 kg  06/09/19 65.8 kg  09/05/18 70.4 kg    No intake or output data in the 24 hours ending 10/26/20 0944   Physical Exam  Awake, answers basic question, dense R. Sided weakness arm >> leg [place].AT,PERRAL Supple Neck,No JVD, No cervical lymphadenopathy appriciated.  Symmetrical Chest wall movement, Good air movement bilaterally, CTAB RRR,No Gallops, Rubs or new Murmurs, No Parasternal Heave +ve B.Sounds, Abd Soft, No tenderness, No organomegaly appriciated, No rebound - guarding or rigidity. No Cyanosis, Clubbing or edema, No new Rash or bruise     Data Review:    CBC  Recent Labs  Lab 10/22/20 0505 10/23/20 0500 10/24/20 0915 10/25/20 0512 10/26/20 0228  WBC 6.3 6.8 10.9* 9.5 8.6  HGB 7.6* 7.9* 9.4* 9.4* 9.7*  HCT 23.3* 23.7* 28.7* 28.6* 29.1*  PLT 162 194 246 252 296  MCV 94.0 92.9 92.0 92.6 91.5  MCH 30.6 31.0 30.1 30.4 30.5  MCHC 32.6 33.3 32.8 32.9 33.3  RDW 12.3 12.1 12.2 12.5 12.3  LYMPHSABS  --   --   --   --  2.7  MONOABS  --   --   --   --  1.4*  EOSABS  --   --   --   --  0.1  BASOSABS  --   --   --   --  0.0    Chemistries  Recent Labs  Lab 10/19/20 1250 10/19/20 1752 10/20/20 1800 10/20/20 1820 10/21/20 0520 10/22/20 0505 10/23/20 0500 10/24/20 0418 10/25/20 0512 10/26/20 0228  NA 146*   < >  --    < > 148* 147* 141 138 137 136  K  --    < >  --   --  3.3* 3.2* 3.3* 3.9 4.0 4.1  CL  --    < >  --   --  118* 116* 110 105 101 100  CO2  --    < >  --   --  25 22 27 24 28 29   GLUCOSE  --    < >  --   --  122* 93 150* 143* 121* 116*  BUN  --    < >  --   --  <5* 5* 6 6 8 10   CREATININE  --    < >  --   --  0.57 0.58 0.55 0.53 0.53 0.53  CALCIUM  --    < >  --   --  8.7* 8.9 8.7* 9.4 9.4 9.7  MG  --    < > 2.0  --  2.0 1.8  --  2.1  --  2.1  AST 20  --   --   --   --  30  --   --   --  48*  ALT 10  --   --   --   --  19  --   --   --  115*  ALKPHOS 47  --   --   --   --  51  --   --   --  81  BILITOT 0.5  --   --    --   --  0.7  --   --   --  0.5   < > = values in this interval not displayed.   ------------------------------------------------------------------------------------------------------------------  No results for input(s): CHOL, HDL, LDLCALC, TRIG, CHOLHDL, LDLDIRECT in the last 72 hours.  Lab Results  Component Value Date   HGBA1C 5.6 10/18/2020   ------------------------------------------------------------------------------------------------------------------ No results for input(s): TSH, T4TOTAL, T3FREE, THYROIDAB in the last 72 hours.  Invalid input(s): FREET3 ------------------------------------------------------------------------------------------------------------------ No results for input(s): VITAMINB12, FOLATE, FERRITIN, TIBC, IRON, RETICCTPCT in the last 72 hours.  Coagulation profile No results for input(s): INR, PROTIME in the last 168 hours.  No results for input(s): DDIMER in the last 72 hours.  Cardiac Enzymes No results for input(s): CKMB, TROPONINI, MYOGLOBIN in the last 168 hours.  Invalid input(s): CK ------------------------------------------------------------------------------------------------------------------ No results found for: BNP  Micro Results Recent Results (from the past 240 hour(s))  Resp Panel by RT-PCR (Flu A&B, Covid) Nasopharyngeal Swab     Status: Abnormal   Collection Time: 10/17/20  6:02 PM   Specimen: Nasopharyngeal Swab; Nasopharyngeal(NP) swabs in vial transport medium  Result Value Ref Range Status   SARS Coronavirus 2 by RT PCR POSITIVE (A) NEGATIVE Final    Comment: RESULT CALLED TO, READ BACK BY AND VERIFIED WITH: MARGE RN 1942 10/17/20 EB (NOTE) SARS-CoV-2 target nucleic acids are DETECTED.  The SARS-CoV-2 RNA is generally detectable in upper respiratory specimens during the acute phase of infection. Positive results are indicative of the presence of the identified virus, but do not rule out bacterial infection or co-infection  with other pathogens not detected by the test. Clinical correlation with patient history and other diagnostic information is necessary to determine patient infection status. The expected result is Negative.  Fact Sheet for Patients: BloggerCourse.com  Fact Sheet for Healthcare Providers: SeriousBroker.it  This test is not yet approved or cleared by the [place] FDA and  has been authorized for detection and/or diagnosis of SARS-CoV-2 by FDA under an Emergency Use Authorization (EUA).  This EUA will remain in effect (meaning this test can be used) fo r the duration of  the COVID-19 declaration under Section 564(b)(1) of the Act, 21 U.S.C. section 360bbb-3(b)(1), unless the authorization is terminated or revoked sooner.     Influenza A by PCR NEGATIVE NEGATIVE Final   Influenza B by PCR NEGATIVE NEGATIVE Final    Comment: (NOTE) The Xpert Xpress SARS-CoV-2/FLU/RSV plus assay is intended as an aid in the diagnosis of influenza from Nasopharyngeal swab specimens and should not be used as a sole basis for treatment. Nasal washings and aspirates are unacceptable for Xpert Xpress SARS-CoV-2/FLU/RSV testing.  Fact Sheet for Patients: BloggerCourse.com  Fact Sheet for Healthcare Providers: SeriousBroker.it  This test is not yet approved or cleared by the [place] FDA and has been authorized for detection and/or diagnosis of SARS-CoV-2 by FDA under an Emergency Use Authorization (EUA). This EUA will remain in effect (meaning this test can be used) for the duration of the COVID-19 declaration under Section 564(b)(1) of the Act, 21 U.S.C. section 360bbb-3(b)(1), unless the authorization is terminated or revoked.  Performed at [facility], [address] [place], [place] [address]   MRSA PCR Screening     Status: None   Collection Time: 10/18/20 10:29 AM   Specimen:  Nasopharyngeal  Result Value Ref Range Status   MRSA by PCR NEGATIVE NEGATIVE Final    Comment:        The GeneXpert MRSA Assay (FDA approved for NASAL specimens only), is one component of a comprehensive MRSA colonization surveillance program. It is not intended to diagnose MRSA infection nor to guide or monitor treatment for MRSA  infections. Performed at [facility], [address] [place], [place] [address]     Radiology Reports CT HEAD WO CONTRAST  Result Date: 10/21/2020 CLINICAL DATA:  Follow-up intracranial hemorrhage EXAM: CT HEAD WITHOUT CONTRAST TECHNIQUE: Contiguous axial images were obtained from the base of the skull through the vertex without intravenous contrast. COMPARISON:  Three days ago FINDINGS: Brain: Evacuated left cerebral hemorrhage with drain in place. No evidence of recurrent hemorrhage. Swelling and pneumocephalus is diminished. No progression of intraventricular clot in the left lateral ventricle. No hydrocephalus. Drain has been retracted so that the tip is in the region of hemorrhagic insult. Vascular: Negative Skull: Craniotomy with scalp swelling.  No interval change. Sinuses/Orbits: Mild generalized mucosal thickening. IMPRESSION: 1. No recurrent hemorrhage. Decreased brain swelling and midline shift. 2. No hydrocephalus. Electronically Signed   By: Marnee Spring M.D.   On: 10/21/2020 06:53   CT HEAD WO CONTRAST  Result Date: 10/18/2020 CLINICAL DATA:  25 year old female code stroke presentation with large left hemisphere hemorrhage, intraventricular extension. Posturing on presentation. Postoperative day 1 status post craniotomy and evacuation of intracranial hematoma. EXAM: CT HEAD WITHOUT CONTRAST TECHNIQUE: Contiguous axial images were obtained from the base of the skull through the vertex without intravenous contrast. COMPARISON:  CT head, CTA head and neck 10/17/2020. FINDINGS: Brain: Small volume left side pneumocephalus. Vertex approach  drainage catheter tracking to the left corona radiata and internal capsule. Substantial interval drainage of intra-axial hemorrhage previously centered at the posterior left middle frontal gyrus, anterior parietal lobe. Small volume residual intra-axial blood, in an area of about 3.4 cm along the course of the drainage catheter (series 3, image 28). Largely resolved regional mass effect. Largely resolved rightward midline shift. Trace extra-axial blood underlying the left craniotomy. Moderate volume left lateral intraventricular hemorrhage is stable. Small volume blood both the 3rd and 4th ventricles, more apparent. Right lateral ventricle appears spared. No ventriculomegaly. Improved basilar cisterns. Partial suprasellar cistern effacement persists. No uncal herniation. No superimposed acute cortically based infarct identified. Vascular: No suspicious intracranial vascular hyperdensity. Skull: Left superior convexity craniotomy. Otherwise stable and intact. Sinuses/Orbits: Right nasoenteric tube remains in place. Fluid in the visible pharynx. Mild new paranasal sinus fluid and mucosal thickening. Tympanic cavities and mastoids remain clear. Other: Postoperative left scalp hematoma, superimposed small volume left scalp fluid and gas collection. Overlying skin staples. Orbits soft tissues remain negative. IMPRESSION: 1. Significantly evacuated left hemisphere hemorrhage by surgery. Percutaneous drain remains in place tracking toward the left internal capsule. Small volume (roughly 3.4 cm diameter) residual intra-axial blood, fluid, and gas. 2. Largely resolved intracranial mass effect and midline shift. Residual partially effaced suprasellar cistern. 3. Stable to minimally increased intraventricular hemorrhage. No ventriculomegaly. 4. Trace extra-axial blood underlying the craniotomy. Small volume left-side pneumocephalus. Postoperative scalp hematoma. 5. No new intracranial abnormality identified. Electronically  Signed   By: Odessa Fleming M.D.   On: 10/18/2020 05:54   CT HEAD WO CONTRAST  Result Date: 10/17/2020 CLINICAL DATA:  Patient is unresponsive and with abnormal posture EXAM: CT HEAD WITHOUT CONTRAST CT CERVICAL SPINE WITHOUT CONTRAST TECHNIQUE: Multidetector CT imaging of the head and cervical spine was performed following the standard protocol without intravenous contrast. Multiplanar CT image reconstructions of the cervical spine were also generated. COMPARISON:  None. FINDINGS: CT HEAD FINDINGS Brain: 5.6 by 3.8 by 4.4 cm (volume = 49 cm^3) acute left frontoparietal intraparenchymal hematoma is noted. This is decompressed into the left lateral ventricle which likewise contains a substantial amount of  acute blood products which also extend into the third ventricle. There is approximately 1.0 cm of left-to-right midline shift. Borderline accentuated density along the tentorium and falx but overall I am skeptical that this represents subarachnoid hemorrhage. Effacement of the basilar cisterns including the suprasellar cistern and ambient cisterns. Vascular: Unremarkable Skull: Unremarkable Sinuses/Orbits: Mild chronic right maxillary sinusitis. CT CERVICAL SPINE FINDINGS Alignment: Reversal the normal cervical lordosis but no subluxation. Skull base and vertebrae: No fracture or acute bony findings. Soft tissues and spinal canal: Unremarkable Disc levels:  No bony impingement. Upper chest: Unremarkable Other: Endotracheal and nasogastric tubes noted. IMPRESSION: 1. 5.6 cm in long axis acute left frontoparietal intraparenchymal hematoma, decompressing into the left lateral ventricle which contains a substantial amount of acute blood products which extend into the third ventricle. 1.0 cm of left-to-right midline shift with effacement of the basilar cisterns. 2. No acute cervical spine findings. 3. Chronic right maxillary sinusitis. Critical Value/emergent results were called by telephone at the time of interpretation on  10/17/2020 at 6:10 pm to provider Dr. Margarita Grizzle , who verbally acknowledged these results. Electronically Signed   By: Gaylyn Rong M.D.   On: 10/17/2020 18:19   CT Cervical Spine Wo Contrast  Result Date: 10/17/2020 CLINICAL DATA:  Patient is unresponsive and with abnormal posture EXAM: CT HEAD WITHOUT CONTRAST CT CERVICAL SPINE WITHOUT CONTRAST TECHNIQUE: Multidetector CT imaging of the head and cervical spine was performed following the standard protocol without intravenous contrast. Multiplanar CT image reconstructions of the cervical spine were also generated. COMPARISON:  None. FINDINGS: CT HEAD FINDINGS Brain: 5.6 by 3.8 by 4.4 cm (volume = 49 cm^3) acute left frontoparietal intraparenchymal hematoma is noted. This is decompressed into the left lateral ventricle which likewise contains a substantial amount of acute blood products which also extend into the third ventricle. There is approximately 1.0 cm of left-to-right midline shift. Borderline accentuated density along the tentorium and falx but overall I am skeptical that this represents subarachnoid hemorrhage. Effacement of the basilar cisterns including the suprasellar cistern and ambient cisterns. Vascular: Unremarkable Skull: Unremarkable Sinuses/Orbits: Mild chronic right maxillary sinusitis. CT CERVICAL SPINE FINDINGS Alignment: Reversal the normal cervical lordosis but no subluxation. Skull base and vertebrae: No fracture or acute bony findings. Soft tissues and spinal canal: Unremarkable Disc levels:  No bony impingement. Upper chest: Unremarkable Other: Endotracheal and nasogastric tubes noted. IMPRESSION: 1. 5.6 cm in long axis acute left frontoparietal intraparenchymal hematoma, decompressing into the left lateral ventricle which contains a substantial amount of acute blood products which extend into the third ventricle. 1.0 cm of left-to-right midline shift with effacement of the basilar cisterns. 2. No acute cervical spine  findings. 3. Chronic right maxillary sinusitis. Critical Value/emergent results were called by telephone at the time of interpretation on 10/17/2020 at 6:10 pm to provider Dr. Margarita Grizzle , who verbally acknowledged these results. Electronically Signed   By: Gaylyn Rong M.D.   On: 10/17/2020 18:19   MR BRAIN W WO CONTRAST  Result Date: 10/19/2020 CLINICAL DATA:  Cerebral hemorrhage suspected EXAM: MRI HEAD WITHOUT AND WITH CONTRAST MRA HEAD WITHOUT AND WITH CONTRAST TECHNIQUE: Multiplanar, multiecho pulse sequences of the brain and surrounding structures were obtained without and with intravenous contrast. Angiographic images of the head were obtained using MRV technique without and with contrast. CONTRAST:  7.81mL GADAVIST GADOBUTROL 1 MMOL/ML IV SOLN COMPARISON:  Head CT from yesterday. CTA of the head neck from 2 days ago FINDINGS: MRI HEAD FINDINGS Brain: Evacuated left parietal hematoma  with expected remaining fluid and blood products. Pneumocephalus has significantly decreased. Midline shift is resolved although there is still signs of elevated intracranial pressure with diffuse effacement of CSF spaces. Intraventricular extension of blood clot. Left-sided drain which traverses the hematoma, tip near the left cerebral peduncle. The catheter does not directly communicate with the ventricular system which is nondilated. No masslike enhancement is seen adjacent to the hemorrhage. There is cortical restricted diffusion about the operative cavity. Accentuated vessels along the superficial aspect of the hematoma cavity, usually reactive. Vascular: As above.  Enhancing dural venous sinuses. Skull and upper cervical spine: Unremarkable craniotomy. Sinuses/Orbits: Negative MRV HEAD FINDINGS No dural venous sinus thrombosis is seen. Narrow right transverse and sigmoid sinuses attributed to developmental variation. No evidence of cortical vein thrombosis or shunting. IMPRESSION: Brain MRI: 1. No mass or  definite vascular lesion underlying the evacuated left parietal hematoma. There is accentuated intravascular enhancement along the superficial cavity which may be reactive; catheter angiography would be contributory. 2. Resolved midline shift. No hydrocephalus. MRV: No evidence for venous thrombosis. Electronically Signed   By: Marnee Spring M.D.   On: 10/19/2020 05:50   DG Chest Portable 1 View  Result Date: 10/17/2020 CLINICAL DATA:  ET tube check EXAM: PORTABLE CHEST 1 VIEW COMPARISON:  Chest radiograph 10/18/2018 at 5:52 p.m. FINDINGS: Interval retraction of the endotracheal tube with tip approximately 1.5 cm above the carina. Enteric catheter in place. Stable cardiomediastinal contours. Low lung volumes but otherwise clear. No pneumothorax or pleural effusion. No acute finding in the visualized skeleton. IMPRESSION: Interval retraction of the endotracheal tube with tip approximately 1.5 cm above the carina. Electronically Signed   By: Emmaline Kluver M.D.   On: 10/17/2020 19:06   DG Chest Port 1 View  Result Date: 10/17/2020 CLINICAL DATA:  Unresponsive. EXAM: PORTABLE CHEST 1 VIEW COMPARISON:  None. FINDINGS: Single frontal view of the chest demonstrates endotracheal tube overlying tracheal air column, tip overlying the right mainstem bronchus. Recommend retracting endotracheal tube approximately 4 cm to ensure adequate placement above the carina. Enteric catheter passes below diaphragm tip and side port projecting over gastric fundus. Cardiac silhouette is unremarkable. No airspace disease, effusion, or pneumothorax. There are no acute bony abnormalities. IMPRESSION: 1. Right mainstem intubation. Recommend retracting endotracheal tube approximately 4 cm to ensure adequate placement above carina. 2. No acute intrathoracic process. Critical Value/emergent results were called by telephone at the time of interpretation on 10/17/2020 at 6:09 pm to provider [facility] RAY , who verbally acknowledged these  results. Electronically Signed   By: Sharlet Salina M.D.   On: 10/17/2020 18:09   DG Abd Portable 1V  Result Date: 10/18/2020 CLINICAL DATA:  Status post nasogastric tube placement EXAM: PORTABLE ABDOMEN - 1 VIEW COMPARISON:  None. FINDINGS: Nasogastric tube appears adequately positioned in the stomach with tip directed towards the stomach fundus. IMPRESSION: Nasogastric tube adequately positioned in the stomach. Electronically Signed   By: Bary Richard M.D.   On: 10/18/2020 12:36   MR MRV HEAD W WO CONTRAST  Result Date: 10/19/2020 CLINICAL DATA:  Cerebral hemorrhage suspected EXAM: MRI HEAD WITHOUT AND WITH CONTRAST MRA HEAD WITHOUT AND WITH CONTRAST TECHNIQUE: Multiplanar, multiecho pulse sequences of the brain and surrounding structures were obtained without and with intravenous contrast. Angiographic images of the head were obtained using MRV technique without and with contrast. CONTRAST:  7.12mL GADAVIST GADOBUTROL 1 MMOL/ML IV SOLN COMPARISON:  Head CT from yesterday. CTA of the head neck from 2 days ago FINDINGS: MRI  HEAD FINDINGS Brain: Evacuated left parietal hematoma with expected remaining fluid and blood products. Pneumocephalus has significantly decreased. Midline shift is resolved although there is still signs of elevated intracranial pressure with diffuse effacement of CSF spaces. Intraventricular extension of blood clot. Left-sided drain which traverses the hematoma, tip near the left cerebral peduncle. The catheter does not directly communicate with the ventricular system which is nondilated. No masslike enhancement is seen adjacent to the hemorrhage. There is cortical restricted diffusion about the operative cavity. Accentuated vessels along the superficial aspect of the hematoma cavity, usually reactive. Vascular: As above.  Enhancing dural venous sinuses. Skull and upper cervical spine: Unremarkable craniotomy. Sinuses/Orbits: Negative MRV HEAD FINDINGS No dural venous sinus thrombosis  is seen. Narrow right transverse and sigmoid sinuses attributed to developmental variation. No evidence of cortical vein thrombosis or shunting. IMPRESSION: Brain MRI: 1. No mass or definite vascular lesion underlying the evacuated left parietal hematoma. There is accentuated intravascular enhancement along the superficial cavity which may be reactive; catheter angiography would be contributory. 2. Resolved midline shift. No hydrocephalus. MRV: No evidence for venous thrombosis. Electronically Signed   By: Marnee Spring M.D.   On: 10/19/2020 05:50   ECHOCARDIOGRAM COMPLETE  Result Date: 10/20/2020    ECHOCARDIOGRAM REPORT   Patient Name:   Jane Martin Date of Exam: 10/19/2020 Medical Rec #:  [id number]        Height:       68.0 in Accession #:    [id number]       Weight:       160.0 lb Date of Birth:  Nov 21, 1995        BSA:          1.859 m Patient Age:    24 years         BP:           100/72 mmHg Patient Gender: F                HR:           71 bpm. Exam Location:  Inpatient Procedure: 2D Echo, Cardiac Doppler and Color Doppler Indications:    Stroke I63.9  History:        Patient has no prior history of Echocardiogram examinations.                 Covid 19 positive.  Sonographer:    Roosvelt Maser RDCS Referring Phys: [id number] IMPRESSIONS  1. Left ventricular ejection fraction, by estimation, is 55 to 60%. The left ventricle has normal function. The left ventricle has no regional wall motion abnormalities. Left ventricular diastolic function could not be evaluated.  2. Right ventricular systolic function is normal. The right ventricular size is normal.  3. The mitral valve is normal in structure. No evidence of mitral valve regurgitation.  4. The aortic valve is normal in structure. Aortic valve regurgitation is not visualized. Comparison(s): No prior Echocardiogram. Conclusion(s)/Recommendation(s): Otherwise normal echocardiogram, with minor abnormalities described in the report. FINDINGS  Left  Ventricle: Left ventricular ejection fraction, by estimation, is 55 to 60%. The left ventricle has normal function. The left ventricle has no regional wall motion abnormalities. The left ventricular internal cavity size was normal in size. There is  no left ventricular hypertrophy. Left ventricular diastolic function could not be evaluated. Right Ventricle: The right ventricular size is normal. No increase in right ventricular wall thickness. Right ventricular systolic function is normal. Left Atrium: Left atrial size was normal in  size. Right Atrium: Right atrial size was normal in size. Pericardium: There is no evidence of pericardial effusion. Mitral Valve: The mitral valve is normal in structure. No evidence of mitral valve regurgitation. Tricuspid Valve: The tricuspid valve is normal in structure. Tricuspid valve regurgitation is not demonstrated. Aortic Valve: The aortic valve is normal in structure. Aortic valve regurgitation is not visualized. Pulmonic Valve: The pulmonic valve was not assessed. Pulmonic valve regurgitation is not visualized. Aorta: The aortic root is normal in size and structure. IAS/Shunts: The atrial septum is grossly normal. Riley Lam MD Electronically signed by Riley Lam MD Signature Date/Time: 10/20/2020/12:04:00 PM    Final    VAS [place] LOWER EXTREMITY VENOUS (DVT)  Result Date: 10/21/2020  Lower Venous DVT Study Indications: Stroke.  Risk Factors: ICH, Covid+. Comparison Study: No previous exams Performing Technologist: Ernestene Mention  Examination Guidelines: A complete evaluation includes B-mode imaging, spectral Doppler, color Doppler, and power Doppler as needed of all accessible portions of each vessel. Bilateral testing is considered an integral part of a complete examination. Limited examinations for reoccurring indications may be performed as noted. The reflux portion of the exam is performed with the patient in reverse Trendelenburg.   +---------+---------------+---------+-----------+----------+--------------+ RIGHT    CompressibilityPhasicitySpontaneityPropertiesThrombus Aging +---------+---------------+---------+-----------+----------+--------------+ CFV      Full           Yes      Yes                                 +---------+---------------+---------+-----------+----------+--------------+ SFJ      Full                                                        +---------+---------------+---------+-----------+----------+--------------+ FV Prox  Full           Yes      Yes                                 +---------+---------------+---------+-----------+----------+--------------+ FV Mid   Full           Yes      Yes                                 +---------+---------------+---------+-----------+----------+--------------+ FV DistalFull           Yes      Yes                                 +---------+---------------+---------+-----------+----------+--------------+ PFV      Full                                                        +---------+---------------+---------+-----------+----------+--------------+ POP      Full           Yes      Yes                                 +---------+---------------+---------+-----------+----------+--------------+  PTV      Full                                                        +---------+---------------+---------+-----------+----------+--------------+ PERO     Full                                                        +---------+---------------+---------+-----------+----------+--------------+   +---------+---------------+---------+-----------+----------+--------------+ LEFT     CompressibilityPhasicitySpontaneityPropertiesThrombus Aging +---------+---------------+---------+-----------+----------+--------------+ CFV      Full           Yes      Yes                                  +---------+---------------+---------+-----------+----------+--------------+ SFJ      Full                                                        +---------+---------------+---------+-----------+----------+--------------+ FV Prox  Full           Yes      Yes                                 +---------+---------------+---------+-----------+----------+--------------+ FV Mid   Full           Yes      Yes                                 +---------+---------------+---------+-----------+----------+--------------+ FV DistalFull           Yes      Yes                                 +---------+---------------+---------+-----------+----------+--------------+ PFV      Full                                                        +---------+---------------+---------+-----------+----------+--------------+ POP      Full           Yes      Yes                                 +---------+---------------+---------+-----------+----------+--------------+ PTV      Full                                                        +---------+---------------+---------+-----------+----------+--------------+  PERO     Full                                                        +---------+---------------+---------+-----------+----------+--------------+     Summary: BILATERAL: - No evidence of deep vein thrombosis seen in the lower extremities, bilaterally. - No evidence of superficial venous thrombosis in the lower extremities, bilaterally. -No evidence of popliteal cyst, bilaterally.   *See table(s) above for measurements and observations. Electronically signed by Gretta Beganodd Early MD on 10/21/2020 at 5:10:53 PM.    Final    US EKG SITE RITE  Result Date: 10/19/2020 If Site Rite image not attached, placement could not be confirmed due to current cardiac rhythm.  CT ANGIO HEAD NECK W WO CM (CODE STROKE)  Result Date: 10/17/2020 CLINICAL DATA:  Patient unresponsive with abnormal posture. EXAM: CT  HEAD WITHOUT CONTRAST CT ANGIOGRAPHY HEAD AND NECK TECHNIQUE: Multidetector CT imaging of the head and neck was performed using the standard protocol during bolus administration of intravenous contrast. Multiplanar CT image reconstructions and MIPs were obtained to evaluate the vascular anatomy. Carotid stenosis measurements (when applicable) are obtained utilizing NASCET criteria, using the distal internal carotid diameter as the denominator. CONTRAST:  75mL OMNIPAQUE IOHEXOL 350 MG/ML SOLN COMPARISON:  CT head/cervical spine performed earlier today 10/17/2020. FINDINGS: CTA NECK FINDINGS Aortic arch: Standard aortic branching. The visualized aortic arch is normal in caliber. No hemodynamically significant innominate or proximal subclavian artery stenosis. Right carotid system: CCA and ICA patent within the neck without stenosis. Left carotid system: CCA and ICA patent within the neck without stenosis. Vertebral arteries: The intracranial vertebral arteries are patent. Skeleton: No acute bony abnormality or aggressive osseous lesion. Other neck: No neck mass or cervical lymphadenopathy. Upper chest: No consolidation within the imaged lung apices. Partially imaged ET tube extending into the right mainstem bronchus. Partially imaged enteric tube. Review of the MIP images confirms the above findings CTA HEAD FINDINGS Anterior circulation: The intracranial internal carotid arteries are patent. The M1 middle cerebral arteries are patent. No M2 proximal branch occlusion or high-grade proximal stenosis is identified. The anterior cerebral arteries are patent. No intracranial aneurysm is identified. No evidence of AVM. No contrast blush at the site of the patient's left cerebral hemisphere acute parenchymal hematoma. Posterior circulation: The intracranial vertebral arteries are patent. The basilar artery is patent. The posterior cerebral arteries are patent. Venous sinuses: The dural venous sinuses are poorly assessed due  to contrast bolus timing. Anatomic variants: None significant Review of the MIP images confirms the above findings Findings of right mainstem bronchus intubation called by telephone at the time of interpretation on 10/17/2020 at 6:31 pm to [facility] , who verbally acknowledged these results. IMPRESSION: CTA neck: 1. The common carotid, internal carotid and vertebral arteries are patent within the neck without stenosis. No evidence of dissection or aneurysm. 2. Right mainstem intubation. CTA head: 1. No intracranial aneurysm or AVM is identified. However, a contrast-enhanced brain MRI and MR angiography should be considered once the hematoma involute to further evaluate for any underlying lesion. 2. No contrast blush in the region of the left cerebral hemisphere acute parenchymal hematoma. 3. No intracranial large vessel occlusion or proximal high-grade arterial stenosis. Electronically Signed   By: Jackey LogeKyle  Golden DO   On: 10/17/2020 18:39

## 2020-10-27 ENCOUNTER — Encounter (HOSPITAL_COMMUNITY): Payer: Self-pay | Admitting: Neurology

## 2020-10-27 DIAGNOSIS — I611 Nontraumatic intracerebral hemorrhage in hemisphere, cortical: Secondary | ICD-10-CM | POA: Diagnosis not present

## 2020-10-27 LAB — MAGNESIUM: Magnesium: 2.3 mg/dL (ref 1.7–2.4)

## 2020-10-27 LAB — COMPREHENSIVE METABOLIC PANEL
ALT: 111 U/L — ABNORMAL HIGH (ref 0–44)
AST: 45 U/L — ABNORMAL HIGH (ref 15–41)
Albumin: 3.7 g/dL (ref 3.5–5.0)
Alkaline Phosphatase: 85 U/L (ref 38–126)
Anion gap: 11 (ref 5–15)
BUN: 12 mg/dL (ref 6–20)
CO2: 29 mmol/L (ref 22–32)
Calcium: 10.2 mg/dL (ref 8.9–10.3)
Chloride: 98 mmol/L (ref 98–111)
Creatinine, Ser: 0.66 mg/dL (ref 0.44–1.00)
GFR, Estimated: >60 mL/min (ref >60)
Glucose, Bld: 103 mg/dL — ABNORMAL HIGH (ref 70–99)
Potassium: 4.6 mmol/L (ref 3.5–5.1)
Sodium: 138 mmol/L (ref 135–145)
Total Bilirubin: 0.6 mg/dL (ref 0.3–1.2)
Total Protein: 7.9 g/dL (ref 6.5–8.1)

## 2020-10-27 LAB — CBC WITH DIFFERENTIAL/PLATELET
Abs Immature Granulocytes: 0.05 10*3/uL (ref 0.00–0.07)
Basophils Absolute: 0.1 10*3/uL (ref 0.0–0.1)
Basophils Relative: 1 %
Eosinophils Absolute: 0.1 10*3/uL (ref 0.0–0.5)
Eosinophils Relative: 1 %
HCT: 29 % — ABNORMAL LOW (ref 36.0–46.0)
Hemoglobin: 9.7 g/dL — ABNORMAL LOW (ref 12.0–15.0)
Immature Granulocytes: 1 %
Lymphocytes Relative: 27 %
Lymphs Abs: 2.3 10*3/uL (ref 0.7–4.0)
MCH: 30.7 pg (ref 26.0–34.0)
MCHC: 33.4 g/dL (ref 30.0–36.0)
MCV: 91.8 fL (ref 80.0–100.0)
Monocytes Absolute: 1.2 10*3/uL — ABNORMAL HIGH (ref 0.1–1.0)
Monocytes Relative: 13 %
Neutro Abs: 5.1 10*3/uL (ref 1.7–7.7)
Neutrophils Relative %: 57 %
Platelets: 344 10*3/uL (ref 150–400)
RBC: 3.16 MIL/uL — ABNORMAL LOW (ref 3.87–5.11)
RDW: 12.1 % (ref 11.5–15.5)
WBC: 8.7 10*3/uL (ref 4.0–10.5)
nRBC: 0 % (ref 0.0–0.2)

## 2020-10-27 LAB — GLUCOSE, CAPILLARY
Glucose-Capillary: 105 mg/dL — ABNORMAL HIGH (ref 70–99)
Glucose-Capillary: 98 mg/dL (ref 70–99)
Glucose-Capillary: 114 mg/dL — ABNORMAL HIGH (ref 70–99)

## 2020-10-27 MED ORDER — POLYETHYLENE GLYCOL 3350 17 G PO PACK: 17.0000 g | PACK | Freq: Every day | ORAL | Status: DC | PRN

## 2020-10-27 MED ORDER — SENNOSIDES 8.8 MG/5ML PO SYRP
5.0000 mL | ORAL_SOLUTION | Freq: Two times a day (BID) | ORAL | Status: DC
  Administered 2020-10-27 – 2020-10-29 (×4): 5 mL via ORAL
  Filled 2020-10-27 (×5): qty 5

## 2020-10-27 MED ORDER — PROMETHAZINE HCL 25 MG PO TABS
12.5000 mg | ORAL_TABLET | ORAL | Status: DC | PRN
  Filled 2020-10-27: qty 1

## 2020-10-27 NOTE — Consult Note (Signed)
Chief Complaint: Patient was seen in consultation today for  Chief Complaint  Patient presents with  . Fall    Referring Physician(s): Dr. Pearlean Brownie  Supervising Physician: Julieanne Cotton  Patient Status: [facility] - In-pt  History of Present Illness: Jane Martin is a 25 y.o. female with no significant past medical history. She arrived to the ED via EMS 10/17/20 after collapsing at a gas station. She was unresponsive upon arrival and was intubated. CT head showed a left frontoparietal intraparenchymal hematoma and she was taken to the OR by neurosurgery for a left parietal craniotomy with evacuation of intracerebral hemorrhage and placement of intraventricular catheter.   She was incidentally found to be COVID positive; asymptomatic and is no longer on isolation precautions.   The patient has made a strong recovery and has been moved out of the ICU. Interventional Radiology has been asked to evaluate this patient for a diagnostic cerebral arteriogram to further assess the etiology of the hemorrhage. This case was reviewed and procedure approved by Dr. Corliss Skains.   No past medical history on file.  Past Surgical History:  Procedure Laterality Date  . CRANIOTOMY Left 10/17/2020   Procedure: CRANIOTOMY HEMATOMA Evacuation;  Surgeon: Barnett Abu, MD;  Location: [facility] OR;  Service: Neurosurgery;  Laterality: Left;    Allergies: Flagyl [metronidazole]  Medications: Prior to Admission medications   Medication Sig Start Date End Date Taking? Authorizing Provider  Cetirizine HCl 10 MG CAPS Take 1 capsule (10 mg total) by mouth daily. 10/15/19  Yes Wieters, Hallie C, PA-C  fluticasone (FLONASE) 50 MCG/ACT nasal spray Place 2 sprays into both nostrils daily. 05/29/19  Yes Lurline Idol, FNP  azithromycin (ZITHROMAX) 500 MG tablet Take 1,000 mg by mouth once. Patient not taking: Reported on 10/18/2020 10/14/20   [provider]  clindamycin (CLEOCIN) 2 % vaginal cream Place 1  Applicatorful vaginally at bedtime. Patient not taking: Reported on 10/18/2020 09/15/20   Merrilee Jansky, MD  clotrimazole (LOTRIMIN) 1 % cream Apply to affected area 2 times daily for up to one week. Patient not taking: Reported on 10/18/2020 02/17/19   Mardella Layman, MD  fexofenadine-pseudoephedrine (ALLEGRA-D) 60-120 MG 12 hr tablet Take 1 tablet by mouth every 12 (twelve) hours. Patient not taking: Reported on 10/18/2020 05/29/19   Lurline Idol, FNP  fluconazole (DIFLUCAN) 150 MG tablet Take 1 tablet (150 mg total) by mouth daily. Take one tablet today.  May repeat in 3 days. Patient not taking: No sig reported 09/13/20   Mickie Bail, NP     Family History  Problem Relation Age of Onset  . Hypertension Mother   . Healthy Father     Social History   Socioeconomic History  . Marital status: Single    Spouse name: Not on file  . Number of children: Not on file  . Years of education: Not on file  . Highest education level: Not on file  Occupational History  . Not on file  Tobacco Use  . Smoking status: Never Smoker  . Smokeless tobacco: Never Used  Vaping Use  . Vaping Use: Never used  Substance and Sexual Activity  . Alcohol use: Yes  . Drug use: Yes    Types: Marijuana  . Sexual activity: Not on file  Other Topics Concern  . Not on file  Social History Narrative  . Not on file   Social Determinants of Health   Financial Resource Strain: Not on file  Food Insecurity: Not on file  Transportation  Needs: Not on file  Physical Activity: Not on file  Stress: Not on file  Social Connections: Not on file    Review of Systems: A 12 point ROS discussed and pertinent positives are indicated in the HPI above.  All other systems are negative.  Review of Systems  Constitutional: Negative for appetite change and fatigue.  Respiratory: Negative for cough and shortness of breath.   Cardiovascular: Negative for chest pain and leg swelling.  Gastrointestinal: Negative for  abdominal pain, diarrhea, nausea and vomiting.  Genitourinary: Positive for difficulty urinating.  Neurological: Positive for weakness. Negative for headaches.  Psychiatric/Behavioral: Negative for confusion.    Vital Signs: BP 112/77   Pulse (!) 111   Temp 98.7 F (37.1 C)   Resp 16   Ht  (1.727 m)   Wt 160 lb (72.6 kg)   SpO2 100%   BMI 24.33 kg/m   Physical Exam Constitutional:      General: She is not in acute distress.    Appearance: She is not ill-appearing.  HENT:     Mouth/Throat:     Mouth: Mucous membranes are dry.     Pharynx: Oropharynx is clear.  Cardiovascular:     Rate and Rhythm: Regular rhythm. Bradycardia present.  Pulmonary:     Effort: Pulmonary effort is normal.     Breath sounds: Normal breath sounds.  Abdominal:     General: Bowel sounds are normal.     Palpations: Abdomen is soft.     Tenderness: There is no abdominal tenderness.  Genitourinary:    Comments: Foley catheter  Musculoskeletal:     Comments: Right arm/hand weakness. Unable to plantar flex feet bilaterally.   Skin:    General: Skin is warm and dry.  Neurological:     Mental Status: She is alert and oriented to person, place, and time.     Cranial Nerves: No dysarthria or facial asymmetry.     Motor: Weakness present.     Comments: Right side weakness.      Imaging: CT HEAD WO CONTRAST  Result Date: 10/21/2020 CLINICAL DATA:  Follow-up intracranial hemorrhage EXAM: CT HEAD WITHOUT CONTRAST TECHNIQUE: Contiguous axial images were obtained from the base of the skull through the vertex without intravenous contrast. COMPARISON:  Three days ago FINDINGS: Brain: Evacuated left cerebral hemorrhage with drain in place. No evidence of recurrent hemorrhage. Swelling and pneumocephalus is diminished. No progression of intraventricular clot in the left lateral ventricle. No hydrocephalus. Drain has been retracted so that the tip is in the region of hemorrhagic insult. Vascular: Negative  Skull: Craniotomy with scalp swelling.  No interval change. Sinuses/Orbits: Mild generalized mucosal thickening. IMPRESSION: 1. No recurrent hemorrhage. Decreased brain swelling and midline shift. 2. No hydrocephalus. Electronically Signed   By: Marnee Spring M.D.   On: 10/21/2020 06:53   CT HEAD WO CONTRAST  Result Date: 10/18/2020 CLINICAL DATA:  25 year old female code stroke presentation with large left hemisphere hemorrhage, intraventricular extension. Posturing on presentation. Postoperative day 1 status post craniotomy and evacuation of intracranial hematoma. EXAM: CT HEAD WITHOUT CONTRAST TECHNIQUE: Contiguous axial images were obtained from the base of the skull through the vertex without intravenous contrast. COMPARISON:  CT head, CTA head and neck 10/17/2020. FINDINGS: Brain: Small volume left side pneumocephalus. Vertex approach drainage catheter tracking to the left corona radiata and internal capsule. Substantial interval drainage of intra-axial hemorrhage previously centered at the posterior left middle frontal gyrus, anterior parietal lobe. Small volume residual intra-axial blood, in  an area of about 3.4 cm along the course of the drainage catheter (series 3, image 28). Largely resolved regional mass effect. Largely resolved rightward midline shift. Trace extra-axial blood underlying the left craniotomy. Moderate volume left lateral intraventricular hemorrhage is stable. Small volume blood both the 3rd and 4th ventricles, more apparent. Right lateral ventricle appears spared. No ventriculomegaly. Improved basilar cisterns. Partial suprasellar cistern effacement persists. No uncal herniation. No superimposed acute cortically based infarct identified. Vascular: No suspicious intracranial vascular hyperdensity. Skull: Left superior convexity craniotomy. Otherwise stable and intact. Sinuses/Orbits: Right nasoenteric tube remains in place. Fluid in the visible pharynx. Mild new paranasal sinus fluid  and mucosal thickening. Tympanic cavities and mastoids remain clear. Other: Postoperative left scalp hematoma, superimposed small volume left scalp fluid and gas collection. Overlying skin staples. Orbits soft tissues remain negative. IMPRESSION: 1. Significantly evacuated left hemisphere hemorrhage by surgery. Percutaneous drain remains in place tracking toward the left internal capsule. Small volume (roughly 3.4 cm diameter) residual intra-axial blood, fluid, and gas. 2. Largely resolved intracranial mass effect and midline shift. Residual partially effaced suprasellar cistern. 3. Stable to minimally increased intraventricular hemorrhage. No ventriculomegaly. 4. Trace extra-axial blood underlying the craniotomy. Small volume left-side pneumocephalus. Postoperative scalp hematoma. 5. No new intracranial abnormality identified. Electronically Signed   By: Odessa Fleming M.D.   On: 10/18/2020 05:54   CT HEAD WO CONTRAST  Result Date: 10/17/2020 CLINICAL DATA:  Patient is unresponsive and with abnormal posture EXAM: CT HEAD WITHOUT CONTRAST CT CERVICAL SPINE WITHOUT CONTRAST TECHNIQUE: Multidetector CT imaging of the head and cervical spine was performed following the standard protocol without intravenous contrast. Multiplanar CT image reconstructions of the cervical spine were also generated. COMPARISON:  None. FINDINGS: CT HEAD FINDINGS Brain: 5.6 by 3.8 by 4.4 cm (volume = 49 cm^3) acute left frontoparietal intraparenchymal hematoma is noted. This is decompressed into the left lateral ventricle which likewise contains a substantial amount of acute blood products which also extend into the third ventricle. There is approximately 1.0 cm of left-to-right midline shift. Borderline accentuated density along the tentorium and falx but overall I am skeptical that this represents subarachnoid hemorrhage. Effacement of the basilar cisterns including the suprasellar cistern and ambient cisterns. Vascular: Unremarkable Skull:  Unremarkable Sinuses/Orbits: Mild chronic right maxillary sinusitis. CT CERVICAL SPINE FINDINGS Alignment: Reversal the normal cervical lordosis but no subluxation. Skull base and vertebrae: No fracture or acute bony findings. Soft tissues and spinal canal: Unremarkable Disc levels:  No bony impingement. Upper chest: Unremarkable Other: Endotracheal and nasogastric tubes noted. IMPRESSION: 1. 5.6 cm in long axis acute left frontoparietal intraparenchymal hematoma, decompressing into the left lateral ventricle which contains a substantial amount of acute blood products which extend into the third ventricle. 1.0 cm of left-to-right midline shift with effacement of the basilar cisterns. 2. No acute cervical spine findings. 3. Chronic right maxillary sinusitis. Critical Value/emergent results were called by telephone at the time of interpretation on 10/17/2020 at 6:10 pm to provider Dr. Margarita Grizzle , who verbally acknowledged these results. Electronically Signed   By: Gaylyn Rong M.D.   On: 10/17/2020 18:19   CT Cervical Spine Wo Contrast  Result Date: 10/17/2020 CLINICAL DATA:  Patient is unresponsive and with abnormal posture EXAM: CT HEAD WITHOUT CONTRAST CT CERVICAL SPINE WITHOUT CONTRAST TECHNIQUE: Multidetector CT imaging of the head and cervical spine was performed following the standard protocol without intravenous contrast. Multiplanar CT image reconstructions of the cervical spine were also generated. COMPARISON:  None. FINDINGS: CT HEAD  FINDINGS Brain: 5.6 by 3.8 by 4.4 cm (volume = 49 cm^3) acute left frontoparietal intraparenchymal hematoma is noted. This is decompressed into the left lateral ventricle which likewise contains a substantial amount of acute blood products which also extend into the third ventricle. There is approximately 1.0 cm of left-to-right midline shift. Borderline accentuated density along the tentorium and falx but overall I am skeptical that this represents subarachnoid  hemorrhage. Effacement of the basilar cisterns including the suprasellar cistern and ambient cisterns. Vascular: Unremarkable Skull: Unremarkable Sinuses/Orbits: Mild chronic right maxillary sinusitis. CT CERVICAL SPINE FINDINGS Alignment: Reversal the normal cervical lordosis but no subluxation. Skull base and vertebrae: No fracture or acute bony findings. Soft tissues and spinal canal: Unremarkable Disc levels:  No bony impingement. Upper chest: Unremarkable Other: Endotracheal and nasogastric tubes noted. IMPRESSION: 1. 5.6 cm in long axis acute left frontoparietal intraparenchymal hematoma, decompressing into the left lateral ventricle which contains a substantial amount of acute blood products which extend into the third ventricle. 1.0 cm of left-to-right midline shift with effacement of the basilar cisterns. 2. No acute cervical spine findings. 3. Chronic right maxillary sinusitis. Critical Value/emergent results were called by telephone at the time of interpretation on 10/17/2020 at 6:10 pm to provider Dr. Margarita Grizzle , who verbally acknowledged these results. Electronically Signed   By: Gaylyn Rong M.D.   On: 10/17/2020 18:19   MR BRAIN W WO CONTRAST  Result Date: 10/19/2020 CLINICAL DATA:  Cerebral hemorrhage suspected EXAM: MRI HEAD WITHOUT AND WITH CONTRAST MRA HEAD WITHOUT AND WITH CONTRAST TECHNIQUE: Multiplanar, multiecho pulse sequences of the brain and surrounding structures were obtained without and with intravenous contrast. Angiographic images of the head were obtained using MRV technique without and with contrast. CONTRAST:  7.66mL GADAVIST GADOBUTROL 1 MMOL/ML IV SOLN COMPARISON:  Head CT from yesterday. CTA of the head neck from 2 days ago FINDINGS: MRI HEAD FINDINGS Brain: Evacuated left parietal hematoma with expected remaining fluid and blood products. Pneumocephalus has significantly decreased. Midline shift is resolved although there is still signs of elevated intracranial  pressure with diffuse effacement of CSF spaces. Intraventricular extension of blood clot. Left-sided drain which traverses the hematoma, tip near the left cerebral peduncle. The catheter does not directly communicate with the ventricular system which is nondilated. No masslike enhancement is seen adjacent to the hemorrhage. There is cortical restricted diffusion about the operative cavity. Accentuated vessels along the superficial aspect of the hematoma cavity, usually reactive. Vascular: As above.  Enhancing dural venous sinuses. Skull and upper cervical spine: Unremarkable craniotomy. Sinuses/Orbits: Negative MRV HEAD FINDINGS No dural venous sinus thrombosis is seen. Narrow right transverse and sigmoid sinuses attributed to developmental variation. No evidence of cortical vein thrombosis or shunting. IMPRESSION: Brain MRI: 1. No mass or definite vascular lesion underlying the evacuated left parietal hematoma. There is accentuated intravascular enhancement along the superficial cavity which may be reactive; catheter angiography would be contributory. 2. Resolved midline shift. No hydrocephalus. MRV: No evidence for venous thrombosis. Electronically Signed   By: Marnee Spring M.D.   On: 10/19/2020 05:50   DG Chest Portable 1 View  Result Date: 10/17/2020 CLINICAL DATA:  ET tube check EXAM: PORTABLE CHEST 1 VIEW COMPARISON:  Chest radiograph 10/18/2018 at 5:52 p.m. FINDINGS: Interval retraction of the endotracheal tube with tip approximately 1.5 cm above the carina. Enteric catheter in place. Stable cardiomediastinal contours. Low lung volumes but otherwise clear. No pneumothorax or pleural effusion. No acute finding in the visualized skeleton. IMPRESSION: Interval retraction  of the endotracheal tube with tip approximately 1.5 cm above the carina. Electronically Signed   By: Emmaline Kluver M.D.   On: 10/17/2020 19:06   DG Chest Port 1 View  Result Date: 10/17/2020 CLINICAL DATA:  Unresponsive. EXAM:  PORTABLE CHEST 1 VIEW COMPARISON:  None. FINDINGS: Single frontal view of the chest demonstrates endotracheal tube overlying tracheal air column, tip overlying the right mainstem bronchus. Recommend retracting endotracheal tube approximately 4 cm to ensure adequate placement above the carina. Enteric catheter passes below diaphragm tip and side port projecting over gastric fundus. Cardiac silhouette is unremarkable. No airspace disease, effusion, or pneumothorax. There are no acute bony abnormalities. IMPRESSION: 1. Right mainstem intubation. Recommend retracting endotracheal tube approximately 4 cm to ensure adequate placement above carina. 2. No acute intrathoracic process. Critical Value/emergent results were called by telephone at the time of interpretation on 10/17/2020 at 6:09 pm to provider [facility] RAY , who verbally acknowledged these results. Electronically Signed   By: Sharlet Salina M.D.   On: 10/17/2020 18:09   DG Abd Portable 1V  Result Date: 10/18/2020 CLINICAL DATA:  Status post nasogastric tube placement EXAM: PORTABLE ABDOMEN - 1 VIEW COMPARISON:  None. FINDINGS: Nasogastric tube appears adequately positioned in the stomach with tip directed towards the stomach fundus. IMPRESSION: Nasogastric tube adequately positioned in the stomach. Electronically Signed   By: Bary Richard M.D.   On: 10/18/2020 12:36   MR MRV HEAD W WO CONTRAST  Result Date: 10/19/2020 CLINICAL DATA:  Cerebral hemorrhage suspected EXAM: MRI HEAD WITHOUT AND WITH CONTRAST MRA HEAD WITHOUT AND WITH CONTRAST TECHNIQUE: Multiplanar, multiecho pulse sequences of the brain and surrounding structures were obtained without and with intravenous contrast. Angiographic images of the head were obtained using MRV technique without and with contrast. CONTRAST:  7.34mL GADAVIST GADOBUTROL 1 MMOL/ML IV SOLN COMPARISON:  Head CT from yesterday. CTA of the head neck from 2 days ago FINDINGS: MRI HEAD FINDINGS Brain: Evacuated left parietal  hematoma with expected remaining fluid and blood products. Pneumocephalus has significantly decreased. Midline shift is resolved although there is still signs of elevated intracranial pressure with diffuse effacement of CSF spaces. Intraventricular extension of blood clot. Left-sided drain which traverses the hematoma, tip near the left cerebral peduncle. The catheter does not directly communicate with the ventricular system which is nondilated. No masslike enhancement is seen adjacent to the hemorrhage. There is cortical restricted diffusion about the operative cavity. Accentuated vessels along the superficial aspect of the hematoma cavity, usually reactive. Vascular: As above.  Enhancing dural venous sinuses. Skull and upper cervical spine: Unremarkable craniotomy. Sinuses/Orbits: Negative MRV HEAD FINDINGS No dural venous sinus thrombosis is seen. Narrow right transverse and sigmoid sinuses attributed to developmental variation. No evidence of cortical vein thrombosis or shunting. IMPRESSION: Brain MRI: 1. No mass or definite vascular lesion underlying the evacuated left parietal hematoma. There is accentuated intravascular enhancement along the superficial cavity which may be reactive; catheter angiography would be contributory. 2. Resolved midline shift. No hydrocephalus. MRV: No evidence for venous thrombosis. Electronically Signed   By: Marnee Spring M.D.   On: 10/19/2020 05:50   ECHOCARDIOGRAM COMPLETE  Result Date: 10/20/2020    ECHOCARDIOGRAM REPORT   Patient Name:   Jane Martin Date of Exam: 10/19/2020 Medical Rec #:  [id number]        Height:       68.0 in Accession #:    [id number]       Weight:  160.0 lb Date of Birth:  [birth year]        BSA:          1.859 m Patient Age:    24 years         BP:           100/72 mmHg Patient Gender: F                HR:           71 bpm. Exam Location:  Inpatient Procedure: 2D Echo, Cardiac Doppler and Color Doppler Indications:    Stroke I63.9  History:         Patient has no prior history of Echocardiogram examinations.                 Covid 19 positive.  Sonographer:    Roosvelt Maser RDCS Referring Phys: [id number] Barnett Abu IMPRESSIONS  1. Left ventricular ejection fraction, by estimation, is 55 to 60%. The left ventricle has normal function. The left ventricle has no regional wall motion abnormalities. Left ventricular diastolic function could not be evaluated.  2. Right ventricular systolic function is normal. The right ventricular size is normal.  3. The mitral valve is normal in structure. No evidence of mitral valve regurgitation.  4. The aortic valve is normal in structure. Aortic valve regurgitation is not visualized. Comparison(s): No prior Echocardiogram. Conclusion(s)/Recommendation(s): Otherwise normal echocardiogram, with minor abnormalities described in the report. FINDINGS  Left Ventricle: Left ventricular ejection fraction, by estimation, is 55 to 60%. The left ventricle has normal function. The left ventricle has no regional wall motion abnormalities. The left ventricular internal cavity size was normal in size. There is  no left ventricular hypertrophy. Left ventricular diastolic function could not be evaluated. Right Ventricle: The right ventricular size is normal. No increase in right ventricular wall thickness. Right ventricular systolic function is normal. Left Atrium: Left atrial size was normal in size. Right Atrium: Right atrial size was normal in size. Pericardium: There is no evidence of pericardial effusion. Mitral Valve: The mitral valve is normal in structure. No evidence of mitral valve regurgitation. Tricuspid Valve: The tricuspid valve is normal in structure. Tricuspid valve regurgitation is not demonstrated. Aortic Valve: The aortic valve is normal in structure. Aortic valve regurgitation is not visualized. Pulmonic Valve: The pulmonic valve was not assessed. Pulmonic valve regurgitation is not visualized. Aorta: The aortic root is  normal in size and structure. IAS/Shunts: The atrial septum is grossly normal. Riley Lam MD Electronically signed by Riley Lam MD Signature Date/Time: 10/20/2020/12:04:00 PM    Final    VAS [place] LOWER EXTREMITY VENOUS (DVT)  Result Date: 10/21/2020  Lower Venous DVT Study Indications: Stroke.  Risk Factors: ICH, Covid+. Comparison Study: No previous exams Performing Technologist: Ernestene Mention  Examination Guidelines: A complete evaluation includes B-mode imaging, spectral Doppler, color Doppler, and power Doppler as needed of all accessible portions of each vessel. Bilateral testing is considered an integral part of a complete examination. Limited examinations for reoccurring indications may be performed as noted. The reflux portion of the exam is performed with the patient in reverse Trendelenburg.  +---------+---------------+---------+-----------+----------+--------------+ RIGHT    CompressibilityPhasicitySpontaneityPropertiesThrombus Aging +---------+---------------+---------+-----------+----------+--------------+ CFV      Full           Yes      Yes                                 +---------+---------------+---------+-----------+----------+--------------+  SFJ      Full                                                        +---------+---------------+---------+-----------+----------+--------------+ FV Prox  Full           Yes      Yes                                 +---------+---------------+---------+-----------+----------+--------------+ FV Mid   Full           Yes      Yes                                 +---------+---------------+---------+-----------+----------+--------------+ FV DistalFull           Yes      Yes                                 +---------+---------------+---------+-----------+----------+--------------+ PFV      Full                                                         +---------+---------------+---------+-----------+----------+--------------+ POP      Full           Yes      Yes                                 +---------+---------------+---------+-----------+----------+--------------+ PTV      Full                                                        +---------+---------------+---------+-----------+----------+--------------+ PERO     Full                                                        +---------+---------------+---------+-----------+----------+--------------+   +---------+---------------+---------+-----------+----------+--------------+ LEFT     CompressibilityPhasicitySpontaneityPropertiesThrombus Aging +---------+---------------+---------+-----------+----------+--------------+ CFV      Full           Yes      Yes                                 +---------+---------------+---------+-----------+----------+--------------+ SFJ      Full                                                        +---------+---------------+---------+-----------+----------+--------------+  FV Prox  Full           Yes      Yes                                 +---------+---------------+---------+-----------+----------+--------------+ FV Mid   Full           Yes      Yes                                 +---------+---------------+---------+-----------+----------+--------------+ FV DistalFull           Yes      Yes                                 +---------+---------------+---------+-----------+----------+--------------+ PFV      Full                                                        +---------+---------------+---------+-----------+----------+--------------+ POP      Full           Yes      Yes                                 +---------+---------------+---------+-----------+----------+--------------+ PTV      Full                                                         +---------+---------------+---------+-----------+----------+--------------+ PERO     Full                                                        +---------+---------------+---------+-----------+----------+--------------+     Summary: BILATERAL: - No evidence of deep vein thrombosis seen in the lower extremities, bilaterally. - No evidence of superficial venous thrombosis in the lower extremities, bilaterally. -No evidence of popliteal cyst, bilaterally.   *See table(s) above for measurements and observations. Electronically signed by Gretta Began MD on 10/21/2020 at 5:10:53 PM.    Final    [place] EKG SITE RITE  Result Date: 10/19/2020 If Site Rite image not attached, placement could not be confirmed due to current cardiac rhythm.  CT ANGIO HEAD NECK W WO CM (CODE STROKE)  Result Date: 10/17/2020 CLINICAL DATA:  Patient unresponsive with abnormal posture. EXAM: CT HEAD WITHOUT CONTRAST CT ANGIOGRAPHY HEAD AND NECK TECHNIQUE: Multidetector CT imaging of the head and neck was performed using the standard protocol during bolus administration of intravenous contrast. Multiplanar CT image reconstructions and MIPs were obtained to evaluate the vascular anatomy. Carotid stenosis measurements (when applicable) are obtained utilizing NASCET criteria, using the distal internal carotid diameter as the denominator. CONTRAST:  55mL OMNIPAQUE IOHEXOL 350 MG/ML SOLN COMPARISON:  CT head/cervical spine performed earlier today 10/17/2020. FINDINGS: CTA NECK  FINDINGS Aortic arch: Standard aortic branching. The visualized aortic arch is normal in caliber. No hemodynamically significant innominate or proximal subclavian artery stenosis. Right carotid system: CCA and ICA patent within the neck without stenosis. Left carotid system: CCA and ICA patent within the neck without stenosis. Vertebral arteries: The intracranial vertebral arteries are patent. Skeleton: No acute bony abnormality or aggressive osseous lesion. Other neck: No  neck mass or cervical lymphadenopathy. Upper chest: No consolidation within the imaged lung apices. Partially imaged ET tube extending into the right mainstem bronchus. Partially imaged enteric tube. Review of the MIP images confirms the above findings CTA HEAD FINDINGS Anterior circulation: The intracranial internal carotid arteries are patent. The M1 middle cerebral arteries are patent. No M2 proximal branch occlusion or high-grade proximal stenosis is identified. The anterior cerebral arteries are patent. No intracranial aneurysm is identified. No evidence of AVM. No contrast blush at the site of the patient's left cerebral hemisphere acute parenchymal hematoma. Posterior circulation: The intracranial vertebral arteries are patent. The basilar artery is patent. The posterior cerebral arteries are patent. Venous sinuses: The dural venous sinuses are poorly assessed due to contrast bolus timing. Anatomic variants: None significant Review of the MIP images confirms the above findings Findings of right mainstem bronchus intubation called by telephone at the time of interpretation on 10/17/2020 at 6:31 pm to provider [facility] RAY , who verbally acknowledged these results. IMPRESSION: CTA neck: 1. The common carotid, internal carotid and vertebral arteries are patent within the neck without stenosis. No evidence of dissection or aneurysm. 2. Right mainstem intubation. CTA head: 1. No intracranial aneurysm or AVM is identified. However, a contrast-enhanced brain MRI and MR angiography should be considered once the hematoma involute to further evaluate for any underlying lesion. 2. No contrast blush in the region of the left cerebral hemisphere acute parenchymal hematoma. 3. No intracranial large vessel occlusion or proximal high-grade arterial stenosis. Electronically Signed   By: Jackey Loge DO   On: 10/17/2020 18:39    Labs:  CBC: Recent Labs    10/24/20 0915 10/25/20 0512 10/26/20 0228 10/27/20 0600  WBC  10.9* 9.5 8.6 8.7  HGB 9.4* 9.4* 9.7* 9.7*  HCT 28.7* 28.6* 29.1* 29.0*  PLT 246 252 296 344    COAGS: Recent Labs    10/17/20 1740 10/19/20 0525  INR 1.3* 1.7*    BMP: Recent Labs    10/24/20 0418 10/25/20 0512 10/26/20 0228 10/27/20 0600  NA 138 137 136 138  K 3.9 4.0 4.1 4.6  CL 105 101 100 98  CO2 GLUCOSE 143* 121* 116* 103*  BUN CALCIUM 9.4 9.4 9.7 10.2  CREATININE 0.53 0.53 0.53 0.66  GFRNONAA >60 >60 >60 >60    LIVER FUNCTION TESTS: Recent Labs    10/19/20 1250 10/22/20 0505 10/26/20 0228 10/27/20 0600  BILITOT 0.5 0.7 0.5 0.6  AST 20 30 48* 45*  ALT 10 19 115* 111*  ALKPHOS 47 51 81 85  PROT 6.2* 6.1* 7.7 7.9  ALBUMIN 3.4* 3.1* 3.5 3.7    TUMOR MARKERS: No results for input(s): AFPTM, CEA, CA199, CHROMGRNA in the last 8760 hours.  Assessment and Plan:  Intracerebral hemorrhage; further imaging requested: Jane Martin, 25 year old female, is tentatively scheduled 10/28/20 for a diagnostic cerebral arteriogram to further evaluate the etiology of the intracerebral hemorrhage. Her mother and sister were at the bedside during our discussion. Her mother signed the consent form at the patient's request.  Risks and benefits of this procedure were discussed with the patient including, but not limited to bleeding, infection, vascular injury or contrast induced renal failure.  This interventional procedure involves the use of X-rays and because of the nature of the planned procedure, it is possible that we will have prolonged use of X-ray fluoroscopy.  Potential radiation risks to you include (but are not limited to) the following: - A slightly elevated risk for cancer  several years later in life. This risk is typically less than 0.5% percent. This risk is low in comparison to the normal incidence of human cancer, which is 33% for women and 50% for men according to the American Cancer Society. - Radiation induced injury can  include skin redness, resembling a rash, tissue breakdown / ulcers and hair loss (which can be temporary or permanent).   The likelihood of either of these occurring depends on the difficulty of the procedure and whether you are sensitive to radiation due to previous procedures, disease, or genetic conditions.   IF your procedure requires a prolonged use of radiation, you will be notified and given written instructions for further action.  It is your responsibility to monitor the irradiated area for the 2 weeks following the procedure and to notify your physician if you are concerned that you have suffered a radiation induced injury.    All of the patient's questions were answered, patient is agreeable to proceed.  Consent signed and in IR. The patient will be NPO after midnight. AM lab orders are in place.  Thank you for this interesting consult.  I greatly enjoyed meeting Jane MooresJahayla M Williamsen and look forward to participating in their care.  A copy of this report was sent to the requesting provider on this date.  Electronically Signed: Alwyn RenJamie Vandell Kun, AGACNP-BC [phone] 10/27/2020, 3:17 PM   I spent a total of 20 Minutes    in face to face in clinical consultation, greater than 50% of which was counseling/coordinating care for diagnostic cerebral arteriogram

## 2020-10-27 NOTE — Consult Note (Signed)
Physical Medicine and Rehabilitation Consult Reason for Consult: Altered mental status Referring Physician: Dr. Thedore Mins   HPI: Jane Martin is a 25 y.o. right-handed female with unremarkable past medical history.  Per chart review patient lives with her mother independent prior to admission working as a Production designer, theatre/television/film at [employer].  Two-level home bed and bath on main level.  Presented 10/17/2020 after collapsing at a gas station.  Required intubation for airway protection.  Cranial CT scan showed a 5.6 cm and long axis acute left frontal parietal intraparenchymal hematoma decompressing into left lateral ventricle which contains a substantial amount of acute blood products which extends into the third ventricle and a 1 cm left to right midline shift with effacement of the basal cisterns.  CT cervical spine negative.  CTA of the head showed no intracranial aneurysm or AVM.  Patient underwent left parietal craniotomy with evacuation of intracerebral hemorrhage placement of intraventricular catheter 10/17/2020 per Dr. Danielle Dess and she was extubated 10/20/2020.  Hospital course patient was found to be COVID-positive requiring isolation through 10/26/2020.  Latest MRI of the brain 10/19/2020 showed no mass or definitive vascular lesion underlying the evacuated left parietal hematoma.  Resolved midline shift.  No hydrocephalus.  Keppra ongoing for seizure prophylaxis.  Awaiting plan for cerebral angiogram.  Echocardiogram with ejection fraction of 55 to 60%.  Bilateral lower extremity Dopplers negative for DVT.  Patient was cleared to begin subcutaneous heparin for DVT prophylaxis 10/19/2020.  Currently on a full liquid diet as well as alternative means of nutritional support..  Therapy evaluations completed due to patient decreased functional mobility recommendations at this  Sister at bedside  Review of Systems  Constitutional: Negative for chills and fever.  HENT: Negative for hearing loss.   Eyes: Negative  for blurred vision and double vision.  Respiratory: Negative for cough and shortness of breath.   Cardiovascular: Negative for chest pain, palpitations and leg swelling.  Gastrointestinal: Positive for constipation. Negative for heartburn, nausea and vomiting.  Genitourinary: Negative for dysuria and hematuria.  Skin: Negative for rash.  Neurological: Positive for speech change.  All other systems reviewed and are negative.  No past medical history on file. Past Surgical History:  Procedure Laterality Date  . CRANIOTOMY Left 10/17/2020   Procedure: CRANIOTOMY HEMATOMA Evacuation;  Surgeon: Barnett Abu, MD;  Location: [facility];  Service: Neurosurgery;  Laterality: Left;   Family History  Problem Relation Age of Onset  . Hypertension Mother   . Healthy Father    Social History:  reports that she has never smoked. She has never used smokeless tobacco. She reports current alcohol use. She reports current drug use. Drug: Marijuana. Allergies:  Allergies  Allergen Reactions  . Flagyl [Metronidazole]    Medications Prior to Admission  Medication Sig Dispense Refill  . Cetirizine HCl 10 MG CAPS Take 1 capsule (10 mg total) by mouth daily. 20 capsule 0  . fluticasone (FLONASE) 50 MCG/ACT nasal spray Place 2 sprays into both nostrils daily. 9.9 mL 2  . azithromycin (ZITHROMAX) 500 MG tablet Take 1,000 mg by mouth once. (Patient not taking: Reported on 10/18/2020)    . clindamycin (CLEOCIN) 2 % vaginal cream Place 1 Applicatorful vaginally at bedtime. (Patient not taking: Reported on 10/18/2020) 40 g 0  . clotrimazole (LOTRIMIN) 1 % cream Apply to affected area 2 times daily for up to one week. (Patient not taking: Reported on 10/18/2020) 15 g 0  . fexofenadine-pseudoephedrine (ALLEGRA-D) 60-120 MG 12 hr tablet Take  1 tablet by mouth every 12 (twelve) hours. (Patient not taking: Reported on 10/18/2020) 30 tablet 0  . fluconazole (DIFLUCAN) 150 MG tablet Take 1 tablet (150 mg total) by mouth daily.  Take one tablet today.  May repeat in 3 days. (Patient not taking: No sig reported) 2 tablet 0    Home: Home Living Family/patient expects to be discharged to:: Private residence Living Arrangements: Parent (mother; works in lab at [employer]) Available Help at Discharge: Family Type of Home: Apartment Home Access: Stairs to enter Secretary/administratorntrance Stairs-Number of Steps: flight Home Layout: Able to live on main level with bedroom/bathroom Bathroom Shower/Tub: Tub/shower Surveyor, mineralsunit,Walk-in shower Bathroom Toilet: Standard Home Equipment: None Additional Comments: Some information from father inconsistent  Functional History: Prior Function Level of Independence: Independent Comments: All subjective information gathered from pt's father via video chat; per pt's father PTA, pt was independent and working as International aid/development workerassistant manager at [employer] Functional Status:  Mobility: Bed Mobility Overal bed mobility: Needs Assistance Bed Mobility: Supine to Sit,Sit to UnitedHealthSupine,Rolling Rolling: Min assist Sidelying to sit: Mod assist,+2 for safety/equipment,+2 for physical assistance Supine to sit: Mod assist,+2 for physical assistance Sit to supine: Min assist Sit to sidelying: Max assist,+2 for physical assistance,+2 for safety/equipment General bed mobility comments: pt requries assistance to initiate supine to sit, needing physical assist to move LEs off edge of bed and elevate trunk Transfers Overall transfer level: Needs assistance Equipment used: 1 person hand held assist,2 person hand held assist Transfer via Lift Equipment: [employer] Transfers: Sit to/from Stand Sit to Stand: Colgate-PalmoliveMin assist,+2 safety/equipment General transfer comment: pt requires hand hold to pull into stand and maintain balance Ambulation/Gait Ambulation/Gait assistance: Mod assist,+2 physical assistance Gait Distance (Feet): 3 Feet Assistive device: 2 person hand held assist Gait Pattern/deviations: Shuffle General Gait Details: pt side  stepping toward R at edge of bed, needs assistance from PT to advance RLE as well as facilitation of weight shift toward left. Pt later marches and is able to lift both feet off ground Gait velocity: reduced Gait velocity interpretation: <1.31 ft/sec, indicative of household ambulator    ADL: ADL Overall ADL's : Needs assistance/impaired Eating/Feeding: NPO Grooming: Wash/dry face,Set up,Supervision/safety,Bed level Toilet Transfer: Minimal assistance,+2 for physical assistance,+2 for safety/equipment (simulated in room) Toilet Transfer Details (indicate cue type and reason): Min A +2 for balance and blocking of LLE Functional mobility during ADLs: Minimal assistance,+2 for physical assistance (side steps) General ADL Comments: Pt continues to present with deficits in cognition, functional use of LUE, balance, and arousal  Cognition: Cognition Overall Cognitive Status: Impaired/Different from baseline Arousal/Alertness: Lethargic Orientation Level: Oriented to person,Oriented to place (Confused) Cognition Arousal/Alertness: Awake/alert Behavior During Therapy: Flat affect,Impulsive Overall Cognitive Status: Impaired/Different from baseline Area of Impairment: Orientation,Attention,Memory,Following commands,Safety/judgement,Awareness,Problem solving Orientation Level: Disoriented to,Place,Time,Situation Current Attention Level: Focused,Sustained (short duration) Memory: Decreased recall of precautions,Decreased short-term memory Following Commands: Follows one step commands with increased time Safety/Judgement: Decreased awareness of safety,Decreased awareness of deficits Awareness: Intellectual Problem Solving: Slow processing,Decreased initiation,Requires verbal cues,Requires tactile cues General Comments: Pt continues to present with lethargy adn requesting to go back to sleep. Pt following simple commands ~75% of time. Pt continues to present with aphasia but able to verbalize more  words together this session. Difficult to assess due to: Impaired communication,Level of arousal  Blood pressure 112/77, pulse (!) 111, temperature 98.7 F (37.1 C), resp. rate 16, height 5\' 8"  (1.727 m), weight 72.6 kg, SpO2 100 %. Physical Exam Constitutional:      Appearance: She  is normal weight.  HENT:     Head: Normocephalic and atraumatic.  Eyes:     Conjunctiva/sclera: Conjunctivae normal.     Pupils: Pupils are equal, round, and reactive to light.  Cardiovascular:     Rate and Rhythm: Normal rate and regular rhythm.     Heart sounds: Normal heart sounds.  Pulmonary:     Effort: Pulmonary effort is normal. No respiratory distress.     Breath sounds: Normal breath sounds.  Abdominal:     General: Abdomen is flat. Bowel sounds are normal. There is no distension.  Neurological:     Mental Status: She is alert.     Comments: Patient is alert no acute distress.  Globally aphasic.  She does provide some short appropriate phrases.  Follows commands  Difficulty following commands for MMT Grossly 4/5 RUE and RLE 5/5 LUE and LLE  Sensation- difficult to assess due to aphasia but appears to have absent LT in LUE and LLE    Psychiatric:        Attention and Perception: Attention normal.        Mood and Affect: Mood normal.        Speech: Speech is delayed.     Comments: Difficulty in assessing cognition due to aphasia   Inconsistent with simple commands, even with gestural cues Difficulty with naming less common objects, can name simple objects but with phonemic errors   Results for orders placed or performed during the hospital encounter of 10/17/20 (from the past 24 hour(s))  Glucose, capillary     Status: Abnormal   Collection Time: 10/26/20  3:44 PM  Result Value Ref Range   Glucose-Capillary 115 (H) 70 - 99 mg/dL  Glucose, capillary     Status: Abnormal   Collection Time: 10/26/20  8:18 PM  Result Value Ref Range   Glucose-Capillary 127 (H) 70 - 99 mg/dL  Glucose,  capillary     Status: Abnormal   Collection Time: 10/26/20 11:51 PM  Result Value Ref Range   Glucose-Capillary 105 (H) 70 - 99 mg/dL  Glucose, capillary     Status: Abnormal   Collection Time: 10/27/20  3:47 AM  Result Value Ref Range   Glucose-Capillary 105 (H) 70 - 99 mg/dL  Magnesium     Status: None   Collection Time: 10/27/20  6:00 AM  Result Value Ref Range   Magnesium 2.3 1.7 - 2.4 mg/dL  Comprehensive metabolic panel     Status: Abnormal   Collection Time: 10/27/20  6:00 AM  Result Value Ref Range   Sodium 138 135 - 145 mmol/L   Potassium 4.6 3.5 - 5.1 mmol/L   Chloride 98 98 - 111 mmol/L   CO2 29 22 - 32 mmol/L   Glucose, Bld 103 (H) 70 - 99 mg/dL   BUN 12 6 - 20 mg/dL   Creatinine, Ser 1.02 0.44 - 1.00 mg/dL   Calcium 72.5 8.9 - 36.6 mg/dL   Total Protein 7.9 6.5 - 8.1 g/dL   Albumin 3.7 3.5 - 5.0 g/dL   AST 45 (H) 15 - 41 U/L   ALT 111 (H) 0 - 44 U/L   Alkaline Phosphatase 85 38 - 126 U/L   Total Bilirubin 0.6 0.3 - 1.2 mg/dL   GFR, Estimated >44 >03 mL/min   Anion gap 11 5 - 15  CBC with Differential/Platelet     Status: Abnormal   Collection Time: 10/27/20  6:00 AM  Result Value Ref Range   WBC 8.7  4.0 - 10.5 K/uL   RBC 3.16 (L) 3.87 - 5.11 MIL/uL   Hemoglobin 9.7 (L) 12.0 - 15.0 g/dL   HCT 91.6 (L) 38.4 - 66.5 %   MCV 91.8 80.0 - 100.0 fL   MCH 30.7 26.0 - 34.0 pg   MCHC 33.4 30.0 - 36.0 g/dL   RDW 99.3 57.0 - 17.7 %   Platelets 344 150 - 400 K/uL   nRBC 0.0 0.0 - 0.2 %   Neutrophils Relative % 57 %   Neutro Abs 5.1 1.7 - 7.7 K/uL   Lymphocytes Relative 27 %   Lymphs Abs 2.3 0.7 - 4.0 K/uL   Monocytes Relative 13 %   Monocytes Absolute 1.2 (H) 0.1 - 1.0 K/uL   Eosinophils Relative 1 %   Eosinophils Absolute 0.1 0.0 - 0.5 K/uL   Basophils Relative 1 %   Basophils Absolute 0.1 0.0 - 0.1 K/uL   Immature Granulocytes 1 %   Abs Immature Granulocytes 0.05 0.00 - 0.07 K/uL  Glucose, capillary     Status: None   Collection Time: 10/27/20  7:47 AM   Result Value Ref Range   Glucose-Capillary 98 70 - 99 mg/dL  Glucose, capillary     Status: Abnormal   Collection Time: 10/27/20 12:27 PM  Result Value Ref Range   Glucose-Capillary 114 (H) 70 - 99 mg/dL   No results found.  Assessment/Plan: Diagnosis: Left Frontoparietal IPH 1. Does the need for close, 24 hr/day medical supervision in concert with the patient's rehab needs make it unreasonable for this patient to be served in a less intensive setting? Yes 2. Co-Morbidities requiring supervision/potential complications: Global aphasia , apraxia, dysphagia, urinary incontinence 3. Due to bladder management, bowel management, safety, skin/wound care, disease management, medication administration, pain management and patient education, does the patient require 24 hr/day rehab nursing? Yes 4. Does the patient require coordinated care of a physician, rehab nurse, therapy disciplines of PT, OT, SLP to address physical and functional deficits in the context of the above medical diagnosis(es)? Yes Addressing deficits in the following areas: balance, endurance, locomotion, strength, transferring, bowel/bladder control, bathing, dressing, feeding, grooming, toileting, cognition, speech, language, swallowing and psychosocial support 5. Can the patient actively participate in an intensive therapy program of at least 3 hrs of therapy per day at least 5 days per week? Yes 6. The potential for patient to make measurable gains while on inpatient rehab is good 7. Anticipated functional outcomes upon discharge from inpatient rehab are supervision  with PT, supervision with OT, supervision with SLP. 8. Estimated rehab length of stay to reach the above functional goals is: 18-21d 9. Anticipated discharge destination: Home 10. Overall Rehab/Functional Prognosis: good  RECOMMENDATIONS: This patient's condition is appropriate for continued rehabilitative care in the following setting: CIR Patient has agreed to  participate in recommended program. N/A Note that insurance prior authorization may be required for reimbursement for recommended care.  Comment: Cerebral angiography planned hold on CIR until it is completed and pt is stable    Charlton Amor, PA-C 10/27/2020   "I have personally performed a face to face diagnostic evaluation of this patient.  Additionally, I have reviewed and concur with the physician assistant's documentation above." Erick Colace M.D. [facility] Fellow Am Acad of Phys Med and Rehab Diplomate Am Board of Electrodiagnostic Med Fellow Am Board of Interventional Pain

## 2020-10-27 NOTE — Plan of Care (Signed)
  Problem: Coping: Goal: Will verbalize positive feelings about self Outcome: Progressing Goal: Will identify appropriate support needs Outcome: Progressing   Problem: Intracerebral Hemorrhage Tissue Perfusion: Goal: Complications of Intracerebral Hemorrhage will be minimized Outcome: Progressing   Problem: Intracerebral Hemorrhage Tissue Perfusion: Goal: Complications of Intracerebral Hemorrhage will be minimized Outcome: Progressing

## 2020-10-27 NOTE — Progress Notes (Signed)
STROKE TEAM PROGRESS NOTE   SUBJECTIVE (INTERVAL HISTORY) Sister is at bedside. Sister reports patient does not like her diet and is not eating a lot. Patient denies new concerns, aphasia much improved but still quite signifcant expressive greater than receptive. Able to speak some full sentences. Asks how much longer she has to stay.  We discussed current plan of care including possible agram, CIR, need to progress with diet. Questions answered.  Discussed re-eval with SLP on rounds.  OBJECTIVE Vitals:   10/26/20 0732 10/26/20 2024 10/26/20 2346 10/27/20 0355  BP: 125/76 118/78 116/70 115/83  Pulse: (!) 101 (!) 109 98 (!) 112  Resp: 17 18 17 17   Temp: 97.9 F (36.6 C) 98.8 F (37.1 C) 98.9 F (37.2 C) 98.4 F (36.9 C)  TempSrc: Oral Oral Oral Oral  SpO2: 100% 100% 99% 99%  Weight:      Height:       CBC:  Recent Labs  Lab 10/26/20 0228 10/27/20 0600  WBC 8.6 8.7  NEUTROABS 4.3 5.1  HGB 9.7* 9.7*  HCT 29.1* 29.0*  MCV 91.5 91.8  PLT 296 344   Basic Metabolic Panel:  Recent Labs  Lab 10/21/20 0520 10/22/20 0505 10/24/20 0418 10/25/20 0512 10/26/20 0228 10/27/20 0600  NA 148*   < > 138   < > 136 138  K 3.3*   < > 3.9   < > 4.1 4.6  CL 118*   < > 105   < > 100 98  CO2 25   < > 24   < > 29 29  GLUCOSE 122*   < > 143*   < > 116* 103*  BUN <5*   < > 6   < > 10 12  CREATININE 0.57   < > 0.53   < > 0.53 0.66  CALCIUM 8.7*   < > 9.4   < > 9.7 10.2  MG 2.0   < > 2.1  --  2.1 2.3  PHOS 3.1  --  3.3  --   --   --    < > = values in this interval not displayed.   Lipid Panel:  No results for input(s): CHOL, TRIG, HDL, CHOLHDL, VLDL, LDLCALC in the last 168 hours. HgbA1c:  Lab Results  Component Value Date   HGBA1C 5.6 10/18/2020   Urine Drug Screen:     Component Value Date/Time   LABOPIA NONE DETECTED 10/18/2020 0030   COCAINSCRNUR NONE DETECTED 10/18/2020 0030   LABBENZ NONE DETECTED 10/18/2020 0030   AMPHETMU NONE DETECTED 10/18/2020 0030   THCU POSITIVE  (A) 10/18/2020 0030   LABBARB NONE DETECTED 10/18/2020 0030    Alcohol Level     Component Value Date/Time   ETH <10 10/17/2020 1740   IMAGING CT head 4/15 left frontoparietal intraparenchymal hematoma, with IVH. 1.0 cm of left-to-right midline shift with effacement of the basilar cisterns  CTA Head and neck  4/15 Unremarkable, no AVM or aneurysm  Brain MRI 4/17 No mass or definite vascular lesion underlying the evacuated left parietal hematoma. There is accentuated intravascular enhancement along the superficial cavity which may be reactive; catheter angiography would be contributory. Resolved midline shift. No hydrocephalus  MRV 4/17 No evidence for venous thrombosis    Repeat CT head 4/19 No recurrent hemorrhage. Decreased brain swelling and midline shift. No hydrocephalus.  2D Echo 1. Left ventricular ejection fraction, by estimation, is 55 to 60%. The  left ventricle has normal function. The left ventricle has no  regional  wall motion abnormalities. Left ventricular diastolic function could not  be evaluated.  2. Right ventricular systolic function is normal. The right ventricular  size is normal.  3. The mitral valve is normal in structure. No evidence of mitral valve  regurgitation.  4. The aortic valve is normal in structure. Aortic valve regurgitation is not visualized.   PHYSICAL EXAM  Temp:  [98.4 F (36.9 C)-98.9 F (37.2 C)] 98.4 F (36.9 C) (04/25 0355) Pulse Rate:  [98-112] 112 (04/25 0355) Resp:  [17-18] 17 (04/25 0355) BP: (115-118)/(70-83) 115/83 (04/25 0355) SpO2:  [99 %-100 %] 99 % (04/25 0355)  General - Well nourished, well developed, in no apparent distress.  Ophthalmologic - fundi not visualized due to noncooperation.  Cardiovascular - Regular rhythm and rate.  Neuro -Awake alert, able to state she is in [place].  She follows commands briskly.  Expressive >receptive aphasia persists. Extraocular movements are full.  Facial muscles are  symmetric.  She has 4+/5 strength throughout except right upper extremity which she is 4 -.   She does have a significant drift right upper extremity but no drift of the other extremities.  Impaired right finger-to-nose and needle coordination.  Significant weakness of right grip and intrinsic hand muscles.  ASSESSMENT/PLAN Ms. KAILLY RICHOUX is a 25 y.o. female with no history, presented to ED by EMS after collapsing at a gas station. It was told that she yelled out for help and for someone to call 911, then collapsed to the ground. When EMS arrived, she was unresponsive which worsened to comatose. Intubated in ED.    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 Etiology unknown.  Repeat HCT stable 4/19  On Keppra s/p craniotomy   VTE prophylaxis - heparin SQ  No antithrombotic prior to admission, now on No antithrombotic  Therapy recommendations:  CIR  Disposition:  TBD  Cerebral edema  CT and MRI post hematoma evaluation showed resolved midline shift  3% saline weaned to off   Na 146-153-148->149->147->141->137  ? Abnormal brain tissue  Per Dr. Danielle Dess path report showed abnormal brain tissue.   Pathology negative so far  MRI - there is accentuated intravascular enhancement along the superficial cavity which may be reactive; catheter angiography may be considered when patient is more stable.  Will consider cerebral angiogram with Dr. Corliss Skains after COVID isolation lifted.  Respiratory failure  Extubated 10/20/20  Tolerating well, on room air  ?? Hypertension . BP goal < 160 . BP stable   Asymptomatic bradycardia  Seemed to worsen with precedex, precedex off, now resolved  HR regular rate and rhythm now.  Agitation  PRN clonazepam, will try to avoid IV sedation.   Birth control  Mom stated that pt has a hormone depo for birth control  Not sure the name  Concerning both estrogen and progesterone  containing  Pt not smoker but vaping - mom not sure if nicotine containing  MRV showed no evidence for cerebral sinus venous thrombosis.  COVID positive  Asymptomatic  Mom does not know any potential exposure  On isolation through 4/25 (10 days)  Acute blood loss anemia  Hgb 9.6->8.8->8.1->7.0->7.6->8.7->9.4  Continue to monitor, stable   Dysphagia, Feeding/Nutrition  Cortrak tube re-placed  On clear liquid diet but not eating much  TF Osmolite per protocol  Dietitian on board  Urinary Retention  Foley placed 4/18  On urecholine  Other Stroke Risk Factors  Vaping - ? Nicotine or non-nicotine (mom  not sure)   Family hx stroke (Mom had a stroke)  Hypokalemia  improved  3.3->3.9->4.0  Other Active Problems  Patient seems to be improving quite well.  Recommend mobilize out of bed and continue ongoing therapy and inpatient rehab consults.  Since COVID restrictions have been lifted recommend diagnostic cerebral catheter angiogram for etiology of her hemorrhage.  Long discussion with patient and sister at the bedside and answered questions.  Greater than 50% time during this 25-minute visit was spent in counseling and coordination of care discussion with care team.  Delia Heady, MD Hospital day # 10

## 2020-10-27 NOTE — Progress Notes (Signed)
PROGRESS NOTE                                                                                                                                                                                                             Patient Demographics:    Jane Martin, is a 25 y.o. female, DOB - 1995-12-23, WYO:[id number]  Outpatient Primary MD for the patient is Obgyn, Ma Hillock   Admit date - 10/17/2020   LOS - 10  Chief Complaint  Patient presents with  . Fall       Brief Narrative: Patient is a 25 y.o. female with no PMHx who collapsed at a local gas station-when EMS arrived-she was [name] was intubated in the emergency room-CT head showed a ICH with midline shift-taken emergently to the OR-and subsequently managed in the neuro ICU.  COVID-19 vaccinated status: Not known  Significant Events: 4/15>> collapsed at a local gas station-intubated in ED-found to have ICH  4/15>> craniotomy and hematoma evacuation. 4/18>> extubated 4/22>> transfer to [facility]  Significant studies: 4/15>> CT head: 5.6 cm acute left frontoparietal intraparenchymal hemorrhage-extending into the third ventricle.  1 cm left to right midline shift. 4/15>> CT C-spine: No acute cervical spine findings. 4/15>> CTA head: No intracranial aneurysm-no large vessel occlusion 4/15>> CTA neck: No evidence of dissection/aneurysm-large vessels are patent 4/16>> UDS: Positive for tetrahydrocannabinol 4/16>> CT: Evacuated left hemisphere hematoma-drain in place-resolved intracranial mass-effect 4/17>> MRI brain: No mass/vascular lesion underlying evacuated left parietal hematoma 4/17>> MRV brain: No evidence of venous thrombosis 4/17>> Echo: EF 55-60% 4/18>> bilateral lower extremity Doppler: No DVT. 4/19>> CT head: No recurrent hemorrhage.  Decrease brain swelling/midline shift.   COVID-19 medications: None  Antibiotics: None  Microbiology  data: None  Procedures: 4/15>> craniotomy and hematoma evacuation.  Consults:  None  DVT prophylaxis: heparin injection 5,000 Units Start: 10/19/20 2200 SCDs Start: 10/17/20 2243     Subjective:   Patient in bed although has some expressive and receptive aphasia does answer basic questions although not very reliably, she denies any headache or any chest or abdominal pain.  She quickly forgets that she has to be discharged to SNF.   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  Continues to have RLE>> RLE weakness.  Remains on  Keppra & Tube feeds.  Will defer further work-up if necessary to neurology service.  May require cerebral angiogram by Dr. Corliss Skains next week per neurology. Defer management of this problem to neurology service.  Dysphagia: Speech following currently on NG feeds and full liquid diet, will hold tube feeds on 10/26/2020 and monitor her oral intake, staff updated.  Acute hypoxic respiratory failure: In setting of ICH-extubated on 4/18-on room air.  Asymptomatic bradycardia: While in the ICU-thought to be due to Precedex.  Monitor for now.  COVID-19 infection: Asymptomatic-unclear if she has been vaccinated.  Finished her isolation on 10/26/2020.  Acute urinary retention: Foley catheter placed on 4/18-on Urecholine & Flomax, failed Foley removal trial on 10/26/2022 and Foley was reinserted on 10/26/2020.   Normocytic anemia: Suspect this is from acute illness-no evidence of blood loss from chart review.  Monitor for now.     Condition - Stable  Family Communication  :   Mother Wilnette Kales [phone] 10/25/20 at 10:07 AM message left, message left again on 10/26/2020 at 9:49 AM   Father Ree Kida - [phone] - on 10/26/20   Code Status :  Full Code  Diet :  Diet Order            Diet full liquid Room service  appropriate? Yes; Fluid consistency: Thin  Diet effective now                  Disposition Plan  :  Status is: Inpatient  Remains inpatient appropriate because:Inpatient level of care appropriate due to severity of illness   Dispo: The patient is from: Home              Anticipated d/c is to: CIR              Patient currently is not medically stable to d/c.   Difficult to place patient No   Barriers to discharge: Dysphagia - NG tube in place  Antimicorbials  :    Anti-infectives (From admission, onward)   None      Inpatient Medications  Scheduled Meds: .  stroke: mapping our early stages of recovery book   Does not apply Once  . bethanechol  10 mg Per Tube TID  . chlorhexidine  15 mL Mouth Rinse BID  . Chlorhexidine Gluconate Cloth  6 each Topical Daily  . docusate  100 mg Per Tube BID  . feeding supplement (PROSource TF)  45 mL Per Tube TID  . free water  250 mL Per Tube Q4H  . heparin injection (subcutaneous)  5,000 Units Subcutaneous Q8H  . levETIRAcetam  500 mg Oral BID  . mouth rinse  15 mL Mouth Rinse q12n4p  . sennosides  5 mL Per Tube BID  . sodium chloride flush  10-40 mL Intracatheter Q12H  . tamsulosin  0.4 mg Oral Daily   Continuous Infusions: . feeding supplement (OSMOLITE 1.5 CAL) 1,000 mL (10/26/20 0516)   PRN Meds:.acetaminophen **OR** acetaminophen (TYLENOL) oral liquid 160 mg/5 mL **OR** acetaminophen, artificial tears, bisacodyl, labetalol, [DISCONTINUED] ondansetron **OR** ondansetron (ZOFRAN) IV, polyethylene glycol, promethazine, sodium phosphate   Time Spent in minutes  25  See all Orders from today for further details   Susa Raring M.D on 10/27/2020 at 11:00 AM  To page go to [email or web address] - use universal password  [facility] -  Office  [phone]    Objective:   Vitals:   10/26/20 2024 10/26/20 2346 10/27/20 0355 10/27/20 0747  BP: 118/78 116/70 115/83 122/83  Pulse: Marland Kitchen)  109 98 (!) 112 99  Resp: Temp: 98.8 F (37.1 C) 98.9 F (37.2 C) 98.4 F (36.9 C) 98.7 F (37.1 C)  TempSrc: Oral Oral Oral   SpO2: 100% 99% 99% 100%  Weight:      Height:        Wt Readings from Last 3 Encounters:  10/17/20 72.6 kg  06/09/19 65.8 kg  09/05/18 70.4 kg     Intake/Output Summary (Last 24 hours) at 10/27/2020 1100 Last data filed at 10/27/2020 0400 Gross per 24 hour  Intake 120 ml  Output 1050 ml  Net -930 ml     Physical Exam  Awake, answers basic question but has some receptive and expressive aphasia, dense R. Sided weakness arm >> leg, foley [place].AT,PERRAL Supple Neck,No JVD, No cervical lymphadenopathy appriciated.  Symmetrical Chest wall movement, Good air movement bilaterally, CTAB RRR,No Gallops, Rubs or new Murmurs, No Parasternal Heave +ve B.Sounds, Abd Soft, No tenderness, No organomegaly appriciated, No rebound - guarding or rigidity. No Cyanosis, Clubbing or edema, No new Rash or bruise    Data Review:    CBC  Recent Labs  Lab 10/23/20 0500 10/24/20 0915 10/25/20 0512 10/26/20 0228 10/27/20 0600  WBC 6.8 10.9* 9.5 8.6 8.7  HGB 7.9* 9.4* 9.4* 9.7* 9.7*  HCT 23.7* 28.7* 28.6* 29.1* 29.0*  PLT 194 246 252 296 344  MCV 92.9 92.0 92.6 91.5 91.8  MCH 31.0 30.1 30.4 30.5 30.7  MCHC 33.3 32.8 32.9 33.3 33.4  RDW 12.1 12.2 12.5 12.3 12.1  LYMPHSABS  --   --   --  2.7 2.3  MONOABS  --   --   --  1.4* 1.2*  EOSABS  --   --   --  0.1 0.1  BASOSABS  --   --   --  0.0 0.1    Chemistries  Recent Labs  Lab 10/21/20 0520 10/22/20 0505 10/23/20 0500 10/24/20 0418 10/25/20 0512 10/26/20 0228 10/27/20 0600  NA 148* 147* 141 138 137 136 138  K 3.3* 3.2* 3.3* 3.9 4.0 4.1 4.6  CL 118* 116* 110 105 101 100 98  CO2 GLUCOSE 122* 93 150* 143* 121* 116* 103*  BUN <5* 5* CREATININE 0.57 0.58 0.55 0.53 0.53 0.53 0.66  CALCIUM 8.7* 8.9 8.7* 9.4 9.4 9.7 10.2  MG 2.0 1.8  --  2.1  --  2.1 2.3  AST  --  30  --   --   --  48* 45*  ALT   --  19  --   --   --  115* 111*  ALKPHOS  --  51  --   --   --  81 85  BILITOT  --  0.7  --   --   --  0.5 0.6   ------------------------------------------------------------------------------------------------------------------ No results for input(s): CHOL, HDL, LDLCALC, TRIG, CHOLHDL, LDLDIRECT in the last 72 hours.  Lab Results  Component Value Date   HGBA1C 5.6 10/18/2020   ------------------------------------------------------------------------------------------------------------------ No results for input(s): TSH, T4TOTAL, T3FREE, THYROIDAB in the last 72 hours.  Invalid input(s): FREET3 ------------------------------------------------------------------------------------------------------------------ No results for input(s): VITAMINB12, FOLATE, FERRITIN, TIBC, IRON, RETICCTPCT in the last 72 hours.  Coagulation profile No results for input(s): INR, PROTIME in the last 168 hours.  No results for input(s): DDIMER in the last 72 hours.  Cardiac Enzymes No results for input(s): CKMB, TROPONINI, MYOGLOBIN in the last  168 hours.  Invalid input(s): CK ------------------------------------------------------------------------------------------------------------------ No results found for: BNP  Micro Results Recent Results (from the past 240 hour(s))  Resp Panel by RT-PCR (Flu A&B, Covid) Nasopharyngeal Swab     Status: Abnormal   Collection Time: 10/17/20  6:02 PM   Specimen: Nasopharyngeal Swab; Nasopharyngeal(NP) swabs in vial transport medium  Result Value Ref Range Status   SARS Coronavirus 2 by RT PCR POSITIVE (A) NEGATIVE Final    Comment: RESULT CALLED TO, READ BACK BY AND VERIFIED WITH: MARGE RN 1942 10/17/20 EB (NOTE) SARS-CoV-2 target nucleic acids are DETECTED.  The SARS-CoV-2 RNA is generally detectable in upper respiratory specimens during the acute phase of infection. Positive results are indicative of the presence of the identified virus, but do not rule out  bacterial infection or co-infection with other pathogens not detected by the test. Clinical correlation with patient history and other diagnostic information is necessary to determine patient infection status. The expected result is Negative.  Fact Sheet for Patients: BloggerCourse.com  Fact Sheet for Healthcare Providers: SeriousBroker.it  This test is not yet approved or cleared by the [place] FDA and  has been authorized for detection and/or diagnosis of SARS-CoV-2 by FDA under an Emergency Use Authorization (EUA).  This EUA will remain in effect (meaning this test can be used) fo r the duration of  the COVID-19 declaration under Section 564(b)(1) of the Act, 21 U.S.C. section 360bbb-3(b)(1), unless the authorization is terminated or revoked sooner.     Influenza A by PCR NEGATIVE NEGATIVE Final   Influenza B by PCR NEGATIVE NEGATIVE Final    Comment: (NOTE) The Xpert Xpress SARS-CoV-2/FLU/RSV plus assay is intended as an aid in the diagnosis of influenza from Nasopharyngeal swab specimens and should not be used as a sole basis for treatment. Nasal washings and aspirates are unacceptable for Xpert Xpress SARS-CoV-2/FLU/RSV testing.  Fact Sheet for Patients: BloggerCourse.com  Fact Sheet for Healthcare Providers: SeriousBroker.it  This test is not yet approved or cleared by the [place] FDA and has been authorized for detection and/or diagnosis of SARS-CoV-2 by FDA under an Emergency Use Authorization (EUA). This EUA will remain in effect (meaning this test can be used) for the duration of the COVID-19 declaration under Section 564(b)(1) of the Act, 21 U.S.C. section 360bbb-3(b)(1), unless the authorization is terminated or revoked.  Performed at [facility], [address] [place], [place] [address]   MRSA PCR Screening     Status: None   Collection  Time: 10/18/20 10:29 AM   Specimen: Nasopharyngeal  Result Value Ref Range Status   MRSA by PCR NEGATIVE NEGATIVE Final    Comment:        The GeneXpert MRSA Assay (FDA approved for NASAL specimens only), is one component of a comprehensive MRSA colonization surveillance program. It is not intended to diagnose MRSA infection nor to guide or monitor treatment for MRSA infections. Performed at [facility], [address] [place], [place] [address]     Radiology Reports CT HEAD WO CONTRAST  Result Date: 10/21/2020 CLINICAL DATA:  Follow-up intracranial hemorrhage EXAM: CT HEAD WITHOUT CONTRAST TECHNIQUE: Contiguous axial images were obtained from the base of the skull through the vertex without intravenous contrast. COMPARISON:  Three days ago FINDINGS: Brain: Evacuated left cerebral hemorrhage with drain in place. No evidence of recurrent hemorrhage. Swelling and pneumocephalus is diminished. No progression of intraventricular clot in the left lateral ventricle. No hydrocephalus. Drain has been retracted so that the  tip is in the region of hemorrhagic insult. Vascular: Negative Skull: Craniotomy with scalp swelling.  No interval change. Sinuses/Orbits: Mild generalized mucosal thickening. IMPRESSION: 1. No recurrent hemorrhage. Decreased brain swelling and midline shift. 2. No hydrocephalus. Electronically Signed   By: Marnee Spring M.D.   On: 10/21/2020 06:53   CT HEAD WO CONTRAST  Result Date: 10/18/2020 CLINICAL DATA:  25 year old female code stroke presentation with large left hemisphere hemorrhage, intraventricular extension. Posturing on presentation. Postoperative day 1 status post craniotomy and evacuation of intracranial hematoma. EXAM: CT HEAD WITHOUT CONTRAST TECHNIQUE: Contiguous axial images were obtained from the base of the skull through the vertex without intravenous contrast. COMPARISON:  CT head, CTA head and neck 10/17/2020. FINDINGS: Brain: Small volume left side  pneumocephalus. Vertex approach drainage catheter tracking to the left corona radiata and internal capsule. Substantial interval drainage of intra-axial hemorrhage previously centered at the posterior left middle frontal gyrus, anterior parietal lobe. Small volume residual intra-axial blood, in an area of about 3.4 cm along the course of the drainage catheter (series 3, image 28). Largely resolved regional mass effect. Largely resolved rightward midline shift. Trace extra-axial blood underlying the left craniotomy. Moderate volume left lateral intraventricular hemorrhage is stable. Small volume blood both the 3rd and 4th ventricles, more apparent. Right lateral ventricle appears spared. No ventriculomegaly. Improved basilar cisterns. Partial suprasellar cistern effacement persists. No uncal herniation. No superimposed acute cortically based infarct identified. Vascular: No suspicious intracranial vascular hyperdensity. Skull: Left superior convexity craniotomy. Otherwise stable and intact. Sinuses/Orbits: Right nasoenteric tube remains in place. Fluid in the visible pharynx. Mild new paranasal sinus fluid and mucosal thickening. Tympanic cavities and mastoids remain clear. Other: Postoperative left scalp hematoma, superimposed small volume left scalp fluid and gas collection. Overlying skin staples. Orbits soft tissues remain negative. IMPRESSION: 1. Significantly evacuated left hemisphere hemorrhage by surgery. Percutaneous drain remains in place tracking toward the left internal capsule. Small volume (roughly 3.4 cm diameter) residual intra-axial blood, fluid, and gas. 2. Largely resolved intracranial mass effect and midline shift. Residual partially effaced suprasellar cistern. 3. Stable to minimally increased intraventricular hemorrhage. No ventriculomegaly. 4. Trace extra-axial blood underlying the craniotomy. Small volume left-side pneumocephalus. Postoperative scalp hematoma. 5. No new intracranial  abnormality identified. Electronically Signed   By: Odessa Fleming M.D.   On: 10/18/2020 05:54   CT HEAD WO CONTRAST  Result Date: 10/17/2020 CLINICAL DATA:  Patient is unresponsive and with abnormal posture EXAM: CT HEAD WITHOUT CONTRAST CT CERVICAL SPINE WITHOUT CONTRAST TECHNIQUE: Multidetector CT imaging of the head and cervical spine was performed following the standard protocol without intravenous contrast. Multiplanar CT image reconstructions of the cervical spine were also generated. COMPARISON:  None. FINDINGS: CT HEAD FINDINGS Brain: 5.6 by 3.8 by 4.4 cm (volume = 49 cm^3) acute left frontoparietal intraparenchymal hematoma is noted. This is decompressed into the left lateral ventricle which likewise contains a substantial amount of acute blood products which also extend into the third ventricle. There is approximately 1.0 cm of left-to-right midline shift. Borderline accentuated density along the tentorium and falx but overall I am skeptical that this represents subarachnoid hemorrhage. Effacement of the basilar cisterns including the suprasellar cistern and ambient cisterns. Vascular: Unremarkable Skull: Unremarkable Sinuses/Orbits: Mild chronic right maxillary sinusitis. CT CERVICAL SPINE FINDINGS Alignment: Reversal the normal cervical lordosis but no subluxation. Skull base and vertebrae: No fracture or acute bony findings. Soft tissues and spinal canal: Unremarkable Disc levels:  No bony impingement. Upper chest: Unremarkable Other: Endotracheal and nasogastric  tubes noted. IMPRESSION: 1. 5.6 cm in long axis acute left frontoparietal intraparenchymal hematoma, decompressing into the left lateral ventricle which contains a substantial amount of acute blood products which extend into the third ventricle. 1.0 cm of left-to-right midline shift with effacement of the basilar cisterns. 2. No acute cervical spine findings. 3. Chronic right maxillary sinusitis. Critical Value/emergent results were called by  telephone at the time of interpretation on 10/17/2020 at 6:10 pm to provider Dr. Margarita Grizzle , who verbally acknowledged these results. Electronically Signed   By: Gaylyn Rong M.D.   On: 10/17/2020 18:19   CT Cervical Spine Wo Contrast  Result Date: 10/17/2020 CLINICAL DATA:  Patient is unresponsive and with abnormal posture EXAM: CT HEAD WITHOUT CONTRAST CT CERVICAL SPINE WITHOUT CONTRAST TECHNIQUE: Multidetector CT imaging of the head and cervical spine was performed following the standard protocol without intravenous contrast. Multiplanar CT image reconstructions of the cervical spine were also generated. COMPARISON:  None. FINDINGS: CT HEAD FINDINGS Brain: 5.6 by 3.8 by 4.4 cm (volume = 49 cm^3) acute left frontoparietal intraparenchymal hematoma is noted. This is decompressed into the left lateral ventricle which likewise contains a substantial amount of acute blood products which also extend into the third ventricle. There is approximately 1.0 cm of left-to-right midline shift. Borderline accentuated density along the tentorium and falx but overall I am skeptical that this represents subarachnoid hemorrhage. Effacement of the basilar cisterns including the suprasellar cistern and ambient cisterns. Vascular: Unremarkable Skull: Unremarkable Sinuses/Orbits: Mild chronic right maxillary sinusitis. CT CERVICAL SPINE FINDINGS Alignment: Reversal the normal cervical lordosis but no subluxation. Skull base and vertebrae: No fracture or acute bony findings. Soft tissues and spinal canal: Unremarkable Disc levels:  No bony impingement. Upper chest: Unremarkable Other: Endotracheal and nasogastric tubes noted. IMPRESSION: 1. 5.6 cm in long axis acute left frontoparietal intraparenchymal hematoma, decompressing into the left lateral ventricle which contains a substantial amount of acute blood products which extend into the third ventricle. 1.0 cm of left-to-right midline shift with effacement of the basilar  cisterns. 2. No acute cervical spine findings. 3. Chronic right maxillary sinusitis. Critical Value/emergent results were called by telephone at the time of interpretation on 10/17/2020 at 6:10 pm to provider Dr. Margarita Grizzle , who verbally acknowledged these results. Electronically Signed   By: Gaylyn Rong M.D.   On: 10/17/2020 18:19   MR BRAIN W WO CONTRAST  Result Date: 10/19/2020 CLINICAL DATA:  Cerebral hemorrhage suspected EXAM: MRI HEAD WITHOUT AND WITH CONTRAST MRA HEAD WITHOUT AND WITH CONTRAST TECHNIQUE: Multiplanar, multiecho pulse sequences of the brain and surrounding structures were obtained without and with intravenous contrast. Angiographic images of the head were obtained using MRV technique without and with contrast. CONTRAST:  7.93mL GADAVIST GADOBUTROL 1 MMOL/ML IV SOLN COMPARISON:  Head CT from yesterday. CTA of the head neck from 2 days ago FINDINGS: MRI HEAD FINDINGS Brain: Evacuated left parietal hematoma with expected remaining fluid and blood products. Pneumocephalus has significantly decreased. Midline shift is resolved although there is still signs of elevated intracranial pressure with diffuse effacement of CSF spaces. Intraventricular extension of blood clot. Left-sided drain which traverses the hematoma, tip near the left cerebral peduncle. The catheter does not directly communicate with the ventricular system which is nondilated. No masslike enhancement is seen adjacent to the hemorrhage. There is cortical restricted diffusion about the operative cavity. Accentuated vessels along the superficial aspect of the hematoma cavity, usually reactive. Vascular: As above.  Enhancing dural venous sinuses. Skull and upper  cervical spine: Unremarkable craniotomy. Sinuses/Orbits: Negative MRV HEAD FINDINGS No dural venous sinus thrombosis is seen. Narrow right transverse and sigmoid sinuses attributed to developmental variation. No evidence of cortical vein thrombosis or shunting.  IMPRESSION: Brain MRI: 1. No mass or definite vascular lesion underlying the evacuated left parietal hematoma. There is accentuated intravascular enhancement along the superficial cavity which may be reactive; catheter angiography would be contributory. 2. Resolved midline shift. No hydrocephalus. MRV: No evidence for venous thrombosis. Electronically Signed   By: Marnee Spring M.D.   On: 10/19/2020 05:50   DG Chest Portable 1 View  Result Date: 10/17/2020 CLINICAL DATA:  ET tube check EXAM: PORTABLE CHEST 1 VIEW COMPARISON:  Chest radiograph 10/18/2018 at 5:52 p.m. FINDINGS: Interval retraction of the endotracheal tube with tip approximately 1.5 cm above the carina. Enteric catheter in place. Stable cardiomediastinal contours. Low lung volumes but otherwise clear. No pneumothorax or pleural effusion. No acute finding in the visualized skeleton. IMPRESSION: Interval retraction of the endotracheal tube with tip approximately 1.5 cm above the carina. Electronically Signed   By: Emmaline Kluver M.D.   On: 10/17/2020 19:06   DG Chest Port 1 View  Result Date: 10/17/2020 CLINICAL DATA:  Unresponsive. EXAM: PORTABLE CHEST 1 VIEW COMPARISON:  None. FINDINGS: Single frontal view of the chest demonstrates endotracheal tube overlying tracheal air column, tip overlying the right mainstem bronchus. Recommend retracting endotracheal tube approximately 4 cm to ensure adequate placement above the carina. Enteric catheter passes below diaphragm tip and side port projecting over gastric fundus. Cardiac silhouette is unremarkable. No airspace disease, effusion, or pneumothorax. There are no acute bony abnormalities. IMPRESSION: 1. Right mainstem intubation. Recommend retracting endotracheal tube approximately 4 cm to ensure adequate placement above carina. 2. No acute intrathoracic process. Critical Value/emergent results were called by telephone at the time of interpretation on 10/17/2020 at 6:09 pm to provider [facility]  RAY , who verbally acknowledged these results. Electronically Signed   By: Sharlet Salina M.D.   On: 10/17/2020 18:09   DG Abd Portable 1V  Result Date: 10/18/2020 CLINICAL DATA:  Status post nasogastric tube placement EXAM: PORTABLE ABDOMEN - 1 VIEW COMPARISON:  None. FINDINGS: Nasogastric tube appears adequately positioned in the stomach with tip directed towards the stomach fundus. IMPRESSION: Nasogastric tube adequately positioned in the stomach. Electronically Signed   By: Bary Richard M.D.   On: 10/18/2020 12:36   MR MRV HEAD W WO CONTRAST  Result Date: 10/19/2020 CLINICAL DATA:  Cerebral hemorrhage suspected EXAM: MRI HEAD WITHOUT AND WITH CONTRAST MRA HEAD WITHOUT AND WITH CONTRAST TECHNIQUE: Multiplanar, multiecho pulse sequences of the brain and surrounding structures were obtained without and with intravenous contrast. Angiographic images of the head were obtained using MRV technique without and with contrast. CONTRAST:  7.86mL GADAVIST GADOBUTROL 1 MMOL/ML IV SOLN COMPARISON:  Head CT from yesterday. CTA of the head neck from 2 days ago FINDINGS: MRI HEAD FINDINGS Brain: Evacuated left parietal hematoma with expected remaining fluid and blood products. Pneumocephalus has significantly decreased. Midline shift is resolved although there is still signs of elevated intracranial pressure with diffuse effacement of CSF spaces. Intraventricular extension of blood clot. Left-sided drain which traverses the hematoma, tip near the left cerebral peduncle. The catheter does not directly communicate with the ventricular system which is nondilated. No masslike enhancement is seen adjacent to the hemorrhage. There is cortical restricted diffusion about the operative cavity. Accentuated vessels along the superficial aspect of the hematoma cavity, usually reactive. Vascular: As above.  Enhancing dural venous sinuses. Skull and upper cervical spine: Unremarkable craniotomy. Sinuses/Orbits: Negative MRV HEAD  FINDINGS No dural venous sinus thrombosis is seen. Narrow right transverse and sigmoid sinuses attributed to developmental variation. No evidence of cortical vein thrombosis or shunting. IMPRESSION: Brain MRI: 1. No mass or definite vascular lesion underlying the evacuated left parietal hematoma. There is accentuated intravascular enhancement along the superficial cavity which may be reactive; catheter angiography would be contributory. 2. Resolved midline shift. No hydrocephalus. MRV: No evidence for venous thrombosis. Electronically Signed   By: Marnee SpringJonathon  Watts M.D.   On: 10/19/2020 05:50   ECHOCARDIOGRAM COMPLETE  Result Date: 10/20/2020    ECHOCARDIOGRAM REPORT   Patient Name:   Marcello MooresJAHAYLA M Chrobak Date of Exam: 10/19/2020 Medical Rec #:  [id number]        Height:       68.0 in Accession #:    [id number]       Weight:       160.0 lb Date of Birth:  [birth year]        BSA:          1.859 m Patient Age:    24 years         BP:           100/72 mmHg Patient Gender: F                HR:           71 bpm. Exam Location:  Inpatient Procedure: 2D Echo, Cardiac Doppler and Color Doppler Indications:    Stroke I63.9  History:        Patient has no prior history of Echocardiogram examinations.                 Covid 19 positive.  Sonographer:    Roosvelt Maserachel Lane RDCS Referring Phys: [id number] Barnett AbuHENRY ELSNER IMPRESSIONS  1. Left ventricular ejection fraction, by estimation, is 55 to 60%. The left ventricle has normal function. The left ventricle has no regional wall motion abnormalities. Left ventricular diastolic function could not be evaluated.  2. Right ventricular systolic function is normal. The right ventricular size is normal.  3. The mitral valve is normal in structure. No evidence of mitral valve regurgitation.  4. The aortic valve is normal in structure. Aortic valve regurgitation is not visualized. Comparison(s): No prior Echocardiogram. Conclusion(s)/Recommendation(s): Otherwise normal echocardiogram, with minor abnormalities  described in the report. FINDINGS  Left Ventricle: Left ventricular ejection fraction, by estimation, is 55 to 60%. The left ventricle has normal function. The left ventricle has no regional wall motion abnormalities. The left ventricular internal cavity size was normal in size. There is  no left ventricular hypertrophy. Left ventricular diastolic function could not be evaluated. Right Ventricle: The right ventricular size is normal. No increase in right ventricular wall thickness. Right ventricular systolic function is normal. Left Atrium: Left atrial size was normal in size. Right Atrium: Right atrial size was normal in size. Pericardium: There is no evidence of pericardial effusion. Mitral Valve: The mitral valve is normal in structure. No evidence of mitral valve regurgitation. Tricuspid Valve: The tricuspid valve is normal in structure. Tricuspid valve regurgitation is not demonstrated. Aortic Valve: The aortic valve is normal in structure. Aortic valve regurgitation is not visualized. Pulmonic Valve: The pulmonic valve was not assessed. Pulmonic valve regurgitation is not visualized. Aorta: The aortic root is normal in size and structure. IAS/Shunts: The atrial septum is grossly normal. Riley LamMahesh Chandrasekhar MD Electronically signed by [place] DragonMahesh  Chandrasekhar MD Signature Date/Time: 10/20/2020/12:04:00 PM    Final    VAS [place] LOWER EXTREMITY VENOUS (DVT)  Result Date: 10/21/2020  Lower Venous DVT Study Indications: Stroke.  Risk Factors: ICH, Covid+. Comparison Study: No previous exams Performing Technologist: Ernestene Mention  Examination Guidelines: A complete evaluation includes B-mode imaging, spectral Doppler, color Doppler, and power Doppler as needed of all accessible portions of each vessel. Bilateral testing is considered an integral part of a complete examination. Limited examinations for reoccurring indications may be performed as noted. The reflux portion of the exam is performed with the patient in reverse  Trendelenburg.  +---------+---------------+---------+-----------+----------+--------------+ RIGHT    CompressibilityPhasicitySpontaneityPropertiesThrombus Aging +---------+---------------+---------+-----------+----------+--------------+ CFV      Full           Yes      Yes                                 +---------+---------------+---------+-----------+----------+--------------+ SFJ      Full                                                        +---------+---------------+---------+-----------+----------+--------------+ FV Prox  Full           Yes      Yes                                 +---------+---------------+---------+-----------+----------+--------------+ FV Mid   Full           Yes      Yes                                 +---------+---------------+---------+-----------+----------+--------------+ FV DistalFull           Yes      Yes                                 +---------+---------------+---------+-----------+----------+--------------+ PFV      Full                                                        +---------+---------------+---------+-----------+----------+--------------+ POP      Full           Yes      Yes                                 +---------+---------------+---------+-----------+----------+--------------+ PTV      Full                                                        +---------+---------------+---------+-----------+----------+--------------+ PERO     Full                                                        +---------+---------------+---------+-----------+----------+--------------+   +---------+---------------+---------+-----------+----------+--------------+  LEFT     CompressibilityPhasicitySpontaneityPropertiesThrombus Aging +---------+---------------+---------+-----------+----------+--------------+ CFV      Full           Yes      Yes                                  +---------+---------------+---------+-----------+----------+--------------+ SFJ      Full                                                        +---------+---------------+---------+-----------+----------+--------------+ FV Prox  Full           Yes      Yes                                 +---------+---------------+---------+-----------+----------+--------------+ FV Mid   Full           Yes      Yes                                 +---------+---------------+---------+-----------+----------+--------------+ FV DistalFull           Yes      Yes                                 +---------+---------------+---------+-----------+----------+--------------+ PFV      Full                                                        +---------+---------------+---------+-----------+----------+--------------+ POP      Full           Yes      Yes                                 +---------+---------------+---------+-----------+----------+--------------+ PTV      Full                                                        +---------+---------------+---------+-----------+----------+--------------+ PERO     Full                                                        +---------+---------------+---------+-----------+----------+--------------+     Summary: BILATERAL: - No evidence of deep vein thrombosis seen in the lower extremities, bilaterally. - No evidence of superficial venous thrombosis in the lower extremities, bilaterally. -No evidence of popliteal cyst, bilaterally.   *See table(s) above for measurements and observations. Electronically signed by Gretta Began MD on 10/21/2020 at 5:10:53 PM.    Final    [place]  EKG SITE RITE  Result Date: 10/19/2020 If Site Rite image not attached, placement could not be confirmed due to current cardiac rhythm.  CT ANGIO HEAD NECK W WO CM (CODE STROKE)  Result Date: 10/17/2020 CLINICAL DATA:  Patient unresponsive with abnormal posture. EXAM: CT  HEAD WITHOUT CONTRAST CT ANGIOGRAPHY HEAD AND NECK TECHNIQUE: Multidetector CT imaging of the head and neck was performed using the standard protocol during bolus administration of intravenous contrast. Multiplanar CT image reconstructions and MIPs were obtained to evaluate the vascular anatomy. Carotid stenosis measurements (when applicable) are obtained utilizing NASCET criteria, using the distal internal carotid diameter as the denominator. CONTRAST:  75mL OMNIPAQUE IOHEXOL 350 MG/ML SOLN COMPARISON:  CT head/cervical spine performed earlier today 10/17/2020. FINDINGS: CTA NECK FINDINGS Aortic arch: Standard aortic branching. The visualized aortic arch is normal in caliber. No hemodynamically significant innominate or proximal subclavian artery stenosis. Right carotid system: CCA and ICA patent within the neck without stenosis. Left carotid system: CCA and ICA patent within the neck without stenosis. Vertebral arteries: The intracranial vertebral arteries are patent. Skeleton: No acute bony abnormality or aggressive osseous lesion. Other neck: No neck mass or cervical lymphadenopathy. Upper chest: No consolidation within the imaged lung apices. Partially imaged ET tube extending into the right mainstem bronchus. Partially imaged enteric tube. Review of the MIP images confirms the above findings CTA HEAD FINDINGS Anterior circulation: The intracranial internal carotid arteries are patent. The M1 middle cerebral arteries are patent. No M2 proximal branch occlusion or high-grade proximal stenosis is identified. The anterior cerebral arteries are patent. No intracranial aneurysm is identified. No evidence of AVM. No contrast blush at the site of the patient's left cerebral hemisphere acute parenchymal hematoma. Posterior circulation: The intracranial vertebral arteries are patent. The basilar artery is patent. The posterior cerebral arteries are patent. Venous sinuses: The dural venous sinuses are poorly assessed due  to contrast bolus timing. Anatomic variants: None significant Review of the MIP images confirms the above findings Findings of right mainstem bronchus intubation called by telephone at the time of interpretation on 10/17/2020 at 6:31 pm to provider [facility] RAY , who verbally acknowledged these results. IMPRESSION: CTA neck: 1. The common carotid, internal carotid and vertebral arteries are patent within the neck without stenosis. No evidence of dissection or aneurysm. 2. Right mainstem intubation. CTA head: 1. No intracranial aneurysm or AVM is identified. However, a contrast-enhanced brain MRI and MR angiography should be considered once the hematoma involute to further evaluate for any underlying lesion. 2. No contrast blush in the region of the left cerebral hemisphere acute parenchymal hematoma. 3. No intracranial large vessel occlusion or proximal high-grade arterial stenosis. Electronically Signed   By: Jackey Loge DO   On: 10/17/2020 18:39

## 2020-10-27 NOTE — Progress Notes (Signed)
  Speech Language Pathology Treatment: Dysphagia;Cognitive-Linquistic  Patient Details Name: Jane Martin MRN: [id number] DOB: 08-Apr-1996 Today's Date: 10/27/2020 Time: 1400-1440 SLP Time Calculation (min) (ACUTE ONLY): 40 min  Assessment / Plan / Recommendation Clinical Impression  Communication:   Pt alert, smiling, and interactive. Presents with a fluent aphasia with improved length of utterance, increased use of grammatical form.  Presents with ongoing phonemic and semantic paraphasias during conversational speech. She shows emerging recognition of word-retrieval deficits.  Confrontation naming tasks completed with 40% accuracy initially. Pt benefited from whole word written cue (initial phoneme, either spoken or written, was not sufficient for word retrieval).  After practicing with 10 objects in room, she was able to achieve 60% accuracy by end of session.  Swallowing:  Pt's dysphagia has resolved.  She was able to masticate crackers, drink thin liquids, and consume solids/liquids simultaneously with no s/s of aspiration nor oral dysphagia.  Recommend advancing diet to regular solids, thin liquids.  Pt very excited and motivated to eat. Cortrak no longer needed.  No f/u needed for swallowing.    HPI HPI: 25 yo female presents to [facility] on 4/15 with collapse at gas station, followed by unresponsiveness. CTH show 5.6 cm in long axis acute left frontoparietal intraparenchymal hematoma, decompressing into the left lateral ventricle which contains a substantial amount of acute blood products which extend  into the third ventricle; 1.0 cm of left-to-right midline shift with effacement of the basilar cisterns. CTA head shows no aneurysm, LVO, or AVM. s/p L parietal crani with evacuation of intracerebral hemorrhage and placement of intraventricular catheter on 4/15. ETT 4/15-4/18. No pertinent PMH.      SLP Plan  Continue with current plan of care       Recommendations  Diet  recommendations: Regular;Thin liquid Liquids provided via: Straw Medication Administration: Whole meds with liquid Supervision: Patient able to self feed                Oral Care Recommendations: Oral care BID Follow up Recommendations: Inpatient Rehab SLP Visit Diagnosis: Aphasia (R47.01);Dysphagia, oropharyngeal phase (R13.12) Plan: Continue with current plan of care       GO               [place] Sarinana L. Samson Frederic, MA CCC/SLP Acute Rehabilitation Services Office number [phone] Pager [phone]  Carolan Shiver 10/27/2020, 3:17 PM

## 2020-10-27 NOTE — Progress Notes (Signed)
Physical Therapy Treatment Patient Details Name: Jane Martin MRN: [id number] DOB: 11-14-1995 Today's Date: 10/27/2020    History of Present Illness 25 yo female presents to [facility] on 4/15 with collapse at gas station, followed by unresponsiveness. CTH show 5.6 cm in long axis acute left frontoparietal intraparenchymal hematoma, decompressing into the left lateral ventricle which contains a substantial amount of acute blood products which extend  into the third ventricle; 1.0 cm of left-to-right midline shift with effacement of the basilar cisterns. CTA head shows no aneurysm, LVO, or AVM. s/p L parietal crani with evacuation of intracerebral hemorrhage and placement of intraventricular catheter on 4/15. ETT 4/15-4/18. Cortrak placed 4/20. No pertinent PMH.    PT Comments    Pt is making excellent progress with mobility and was able to amb some in room. Continues to need significant assist due to rt weakness and rt inattention and decr awareness of deficits. Continue to recommend CIR prior to return home to maximize independence and safety.    Follow Up Recommendations  CIR     Equipment Recommendations  Wheelchair (measurements PT);Other (comment) (hemiwalker vs rolling walker with platform vs cane)    Recommendations for Other Services       Precautions / Restrictions Precautions Precautions: Fall Precaution Comments: R inattention    Mobility  Bed Mobility Overal bed mobility: Needs Assistance Bed Mobility: Supine to Sit;Sit to Supine     Supine to sit: Min assist Sit to supine: Min assist   General bed mobility comments: Assist to bring RUE with her due to inattention and weakness    Transfers Overall transfer level: Needs assistance Equipment used: Rolling walker (2 wheeled) Transfers: Sit to/from Stand Sit to Stand: Min assist;+2 safety/equipment         General transfer comment: Assist to initially stabilize rt hip and knee. Assist for placement of lt hand on  walker and to keep hand there. Stood 5-6 times  Ambulation/Gait Ambulation/Gait assistance: Mod assist;+2 physical assistance;Min assist Gait Distance (Feet): 25 Feet (12' x 2, 25' x 1) Assistive device: Rolling walker (2 wheeled) Gait Pattern/deviations: Step-through pattern;Decreased step length - right;Decreased step length - left;Decreased dorsiflexion - right;Decreased stance time - right;Narrow base of support Gait velocity: decr Gait velocity interpretation: <1.31 ft/sec, indicative of household ambulator General Gait Details: Initial assist to advance RLE but with incr practice pt able to advance with decr foot clearance due to foot drop. Instability in rt knee in stance but no buckling. Assist for balance and support throughout. Assist to manage rt hand on walker and to manage walker. With practice pt with decr assist required   Stairs             Wheelchair Mobility    Modified Rankin (Stroke Patients Only) Modified Rankin (Stroke Patients Only) Pre-Morbid Rankin Score: No symptoms Modified Rankin: Moderately severe disability     Balance Overall balance assessment: Needs assistance Sitting-balance support: No upper extremity supported;Feet supported Sitting balance-Leahy Scale: Fair     Standing balance support: Single extremity supported;Bilateral upper extremity supported Standing balance-Leahy Scale: Poor Standing balance comment: UE support and min assist for static standing                            Cognition Arousal/Alertness: Awake/alert Behavior During Therapy: Impulsive Overall Cognitive Status: Impaired/Different from baseline Area of Impairment: Attention;Memory;Following commands;Safety/judgement;Awareness;Problem solving                 Orientation  Level:  (Did not ask orientation questions) Current Attention Level: Sustained Memory: Decreased recall of precautions;Decreased short-term memory Following Commands: Follows one  step commands consistently;Follows multi-step commands inconsistently Safety/Judgement: Decreased awareness of safety;Decreased awareness of deficits Awareness: Intellectual Problem Solving: Requires verbal cues;Requires tactile cues        Exercises      General Comments General comments (skin integrity, edema, etc.): HR to 150's with amb      Pertinent Vitals/Pain Pain Assessment: No/denies pain    Home Living                      Prior Function            PT Goals (current goals can now be found in the care plan section) Progress towards PT goals: Progressing toward goals;Goals met and updated - see care plan    Frequency    Min 4X/week      PT Plan Current plan remains appropriate    Co-evaluation              AM-PAC PT "6 Clicks" Mobility   Outcome Measure  Help needed turning from your back to your side while in a flat bed without using bedrails?: A Little Help needed moving from lying on your back to sitting on the side of a flat bed without using bedrails?: A Little Help needed moving to and from a bed to a chair (including a wheelchair)?: A Little Help needed standing up from a chair using your arms (e.g., wheelchair or bedside chair)?: A Little Help needed to walk in hospital room?: A Lot Help needed climbing 3-5 steps with a railing? : Total 6 Click Score: 15    End of Session Equipment Utilized During Treatment: Gait belt Activity Tolerance: Patient tolerated treatment well Patient left: in bed;with call bell/phone within reach;with bed alarm set;with family/visitor present Nurse Communication: Mobility status PT Visit Diagnosis: Other abnormalities of gait and mobility (R26.89);Difficulty in walking, not elsewhere classified (R26.2);Hemiplegia and hemiparesis;Unsteadiness on feet (R26.81);Muscle weakness (generalized) (M62.81);Other symptoms and signs involving the nervous system (R29.898) Hemiplegia - Right/Left: Right Hemiplegia -  dominant/non-dominant: Dominant Hemiplegia - caused by: Other cerebrovascular disease     Time: 8889-1694 PT Time Calculation (min) (ACUTE ONLY): 26 min  Charges:  $Therapeutic Activity: 23-37 mins                     [place] Pager [phone] Office [place] 10/27/2020, 5:46 PM

## 2020-10-28 LAB — COMPREHENSIVE METABOLIC PANEL
ALT: 116 U/L — ABNORMAL HIGH (ref 0–44)
AST: 52 U/L — ABNORMAL HIGH (ref 15–41)
Albumin: 3.7 g/dL (ref 3.5–5.0)
Alkaline Phosphatase: 86 U/L (ref 38–126)
Anion gap: 11 (ref 5–15)
BUN: 15 mg/dL (ref 6–20)
CO2: 27 mmol/L (ref 22–32)
Calcium: 10 mg/dL (ref 8.9–10.3)
Chloride: 99 mmol/L (ref 98–111)
Creatinine, Ser: 0.69 mg/dL (ref 0.44–1.00)
GFR, Estimated: >60 mL/min (ref >60)
Glucose, Bld: 92 mg/dL (ref 70–99)
Potassium: 4.4 mmol/L (ref 3.5–5.1)
Sodium: 137 mmol/L (ref 135–145)
Total Bilirubin: 0.7 mg/dL (ref 0.3–1.2)
Total Protein: 7.6 g/dL (ref 6.5–8.1)

## 2020-10-28 LAB — CBC WITH DIFFERENTIAL/PLATELET
Abs Immature Granulocytes: 0.06 10*3/uL (ref 0.00–0.07)
Basophils Absolute: 0.1 10*3/uL (ref 0.0–0.1)
Basophils Relative: 1 %
Eosinophils Absolute: 0.1 10*3/uL (ref 0.0–0.5)
Eosinophils Relative: 1 %
HCT: 29.5 % — ABNORMAL LOW (ref 36.0–46.0)
Hemoglobin: 9.9 g/dL — ABNORMAL LOW (ref 12.0–15.0)
Immature Granulocytes: 1 %
Lymphocytes Relative: 33 %
Lymphs Abs: 2.8 10*3/uL (ref 0.7–4.0)
MCH: 30.6 pg (ref 26.0–34.0)
MCHC: 33.6 g/dL (ref 30.0–36.0)
MCV: 91 fL (ref 80.0–100.0)
Monocytes Absolute: 0.9 10*3/uL (ref 0.1–1.0)
Monocytes Relative: 11 %
Neutro Abs: 4.4 10*3/uL (ref 1.7–7.7)
Neutrophils Relative %: 53 %
Platelets: 360 10*3/uL (ref 150–400)
RBC: 3.24 MIL/uL — ABNORMAL LOW (ref 3.87–5.11)
RDW: 12.2 % (ref 11.5–15.5)
WBC: 8.5 10*3/uL (ref 4.0–10.5)
nRBC: 0 % (ref 0.0–0.2)

## 2020-10-28 LAB — PROTIME-INR
INR: 1.1 (ref 0.8–1.2)
Prothrombin Time: 14.7 seconds (ref 11.4–15.2)

## 2020-10-28 LAB — MAGNESIUM: Magnesium: 2.3 mg/dL (ref 1.7–2.4)

## 2020-10-28 MED ORDER — ENSURE ENLIVE PO LIQD
237.0000 mL | Freq: Two times a day (BID) | ORAL | Status: DC
  Administered 2020-10-28 – 2020-10-30 (×4): 237 mL via ORAL

## 2020-10-28 MED ORDER — DOCUSATE SODIUM 50 MG/5ML PO LIQD
100.0000 mg | Freq: Two times a day (BID) | ORAL | Status: DC
  Administered 2020-10-29: 100 mg via ORAL
  Filled 2020-10-28: qty 10

## 2020-10-28 MED ORDER — BETHANECHOL CHLORIDE 10 MG PO TABS
10.0000 mg | ORAL_TABLET | Freq: Three times a day (TID) | ORAL | Status: DC
  Administered 2020-10-29 – 2020-10-30 (×4): 10 mg via ORAL
  Filled 2020-10-28 (×4): qty 1

## 2020-10-28 NOTE — Progress Notes (Signed)
Patient ID: Jane Martin, female   DOB: May 14, 1996, 25 y.o.   MRN: [id number] Patient is off for angiogram today however this is likely to be canceled secondary to code stroke we will check on her tomorrow

## 2020-10-28 NOTE — Progress Notes (Signed)
Physical Therapy Treatment Patient Details Name: Jane Martin MRN: [id number] DOB: September 24, 1995 Today's Date: 10/28/2020    History of Present Illness 25 yo female presents to [facility] on 4/15 with collapse at gas station, followed by unresponsiveness. CTH show 5.6 cm in long axis acute left frontoparietal intraparenchymal hematoma, decompressing into the left lateral ventricle which contains a substantial amount of acute blood products which extend  into the third ventricle; 1.0 cm of left-to-right midline shift with effacement of the basilar cisterns. CTA head shows no aneurysm, LVO, or AVM. s/p L parietal crani with evacuation of intracerebral hemorrhage and placement of intraventricular catheter on 4/15. ETT 4/15-4/18. [facility]rak 4/20-4/25. Marland Kitchen No pertinent PMH.    PT Comments    Pt is making excellent progress with mobility. Continue to recommend CIR. Pt very motivated to return to independence.    Follow Up Recommendations  CIR     Equipment Recommendations  Other (comment) (hemiwalker vs rolling walker with platform vs cane)    Recommendations for Other Services       Precautions / Restrictions Precautions Precautions: Fall Precaution Comments: R inattention    Mobility  Bed Mobility Overal bed mobility: Needs Assistance Bed Mobility: Supine to Sit     Supine to sit: Min guard;HOB elevated     General bed mobility comments: Incr time but no physical assist. Pt brought RUE around without cues    Transfers Overall transfer level: Needs assistance Equipment used: 2 person hand held assist;Rolling walker (2 wheeled) Transfers: Sit to/from Stand Sit to Stand: Min assist;+2 safety/equipment         General transfer comment: Assist for balance and stability of RLE  Ambulation/Gait Ambulation/Gait assistance: +2 physical assistance;Min assist Gait Distance (Feet): 100 Feet (x 2) Assistive device: Rolling walker (2 wheeled) Gait Pattern/deviations: Step-through  pattern;Decreased step length - right;Decreased step length - left;Decreased dorsiflexion - right;Decreased stance time - right;Narrow base of support Gait velocity: decr Gait velocity interpretation: 1.31 - 2.62 ft/sec, indicative of limited community ambulator General Gait Details: Assist for balance and to monitor stability of RLE. Used 2 person hand held since with walker pt unable to maintain RUE on walker and managing walker requires more assist than without walker   Stairs             Wheelchair Mobility    Modified Rankin (Stroke Patients Only) Modified Rankin (Stroke Patients Only) Pre-Morbid Rankin Score: No symptoms Modified Rankin: Moderately severe disability     Balance Overall balance assessment: Needs assistance Sitting-balance support: No upper extremity supported;Feet supported Sitting balance-Leahy Scale: Fair     Standing balance support: Single extremity supported;Bilateral upper extremity supported Standing balance-Leahy Scale: Poor Standing balance comment: UE support and min assist for static standing                            Cognition Arousal/Alertness: Awake/alert Behavior During Therapy: Impulsive Overall Cognitive Status: Difficult to assess                           Safety/Judgement: Decreased awareness of safety;Decreased awareness of deficits     General Comments: Rt inattention      Exercises      General Comments General comments (skin integrity, edema, etc.): HR to 150's      Pertinent Vitals/Pain Pain Assessment: No/denies pain    Home Living  Prior Function            PT Goals (current goals can now be found in the care plan section) Progress towards PT goals: Progressing toward goals;Goals met and updated - see care plan    Frequency    Min 4X/week      PT Plan Current plan remains appropriate    Co-evaluation              AM-PAC PT "6 Clicks"  Mobility   Outcome Measure  Help needed turning from your back to your side while in a flat bed without using bedrails?: A Little Help needed moving from lying on your back to sitting on the side of a flat bed without using bedrails?: A Little Help needed moving to and from a bed to a chair (including a wheelchair)?: A Little Help needed standing up from a chair using your arms (e.g., wheelchair or bedside chair)?: A Little Help needed to walk in hospital room?: A Little Help needed climbing 3-5 steps with a railing? : Total 6 Click Score: 16    End of Session Equipment Utilized During Treatment: Gait belt Activity Tolerance: Patient tolerated treatment well Patient left: with call bell/phone within reach;with family/visitor present;in chair;with chair alarm set Nurse Communication: Mobility status PT Visit Diagnosis: Other abnormalities of gait and mobility (R26.89);Difficulty in walking, not elsewhere classified (R26.2);Hemiplegia and hemiparesis;Unsteadiness on feet (R26.81);Muscle weakness (generalized) (M62.81);Other symptoms and signs involving the nervous system (R29.898) Hemiplegia - Right/Left: Right Hemiplegia - dominant/non-dominant: Dominant Hemiplegia - caused by: Other cerebrovascular disease     Time: 1511-1529 PT Time Calculation (min) (ACUTE ONLY): 18 min  Charges:  $Therapeutic Activity: 8-22 mins                     [place] Pager [phone] Office [place] 10/28/2020, 5:11 PM

## 2020-10-28 NOTE — Progress Notes (Signed)
Unable to remove patient staples, as she reported excruciating pains. Patient did not tolerate the procedure well. Dr. Danielle Dess office notified.

## 2020-10-28 NOTE — Progress Notes (Signed)
Inpatient Rehab Admissions Coordinator:   I met with pt at the bedside to review goals/expectations of CIR admit and to answer questions.  She is open to a CIR admission and states she plans to d/c home to her mother's, with mom and sister providing 24/7 supervision.  Pt seems mostly cognitively intact, with some mild deficits noted.  I will start insurance authorization process and call mom to confirm dispo today.   Shann Medal, PT, DPT Admissions Coordinator [phone] 10/28/20  11:19 AM

## 2020-10-28 NOTE — Progress Notes (Signed)
PROGRESS NOTE                                                                                                                                                                                                             Patient Demographics:    Jane Martin, is a 25 y.o. female, DOB - Aug 31, 1995, ZOX:[id number]  Outpatient Primary MD for the patient is Obgyn, Ma Hillock   Admit date - 10/17/2020   LOS - 11  Chief Complaint  Patient presents with  . Fall       Brief Narrative: Patient is a 25 y.o. female with no PMHx who collapsed at a local gas station-when EMS arrived-she was Jane Martin was intubated in the emergency room-CT head showed a ICH with midline shift-taken emergently to the OR-and subsequently managed in the neuro ICU.  COVID-19 vaccinated status: Not known  Significant Events: 4/15>> collapsed at a local gas station-intubated in ED-found to have ICH  4/15>> craniotomy and hematoma evacuation. 4/18>> extubated 4/22>> transfer to [facility]  Significant studies: 4/15>> CT head: 5.6 cm acute left frontoparietal intraparenchymal hemorrhage-extending into the third ventricle.  1 cm left to right midline shift. 4/15>> CT C-spine: No acute cervical spine findings. 4/15>> CTA head: No intracranial aneurysm-no large vessel occlusion 4/15>> CTA neck: No evidence of dissection/aneurysm-large vessels are patent 4/16>> UDS: Positive for tetrahydrocannabinol 4/16>> CT: Evacuated left hemisphere hematoma-drain in place-resolved intracranial mass-effect 4/17>> MRI brain: No mass/vascular lesion underlying evacuated left parietal hematoma 4/17>> MRV brain: No evidence of venous thrombosis 4/17>> Echo: EF 55-60% 4/18>> bilateral lower extremity Doppler: No DVT. 4/19>> CT head: No recurrent hemorrhage.  Decrease brain swelling/midline shift.   COVID-19 medications: None  Antibiotics: None  Microbiology  data: None  Procedures: 4/15>> craniotomy and hematoma evacuation.  Consults:  None  DVT prophylaxis: heparin injection 5,000 Units Start: 10/19/20 2200 SCDs Start: 10/17/20 2243     Subjective:   Patient in bed although has some expressive and receptive aphasia does answer basic questions although not very reliably, she denies any headache or any chest or abdominal pain.  She quickly forgets that she has to be discharged to SNF.   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  Continues to have RLE>> RLE weakness.  Remains on  Keppra & Tube feeds.  Will defer further work-up if necessary to neurology service.  Likely will undergo cerebral angiogram by Dr. Corliss Skains on 10/29/2020. Defer management of this problem to neurology service.  Approaching discharge in the next 1 to 2 days.  Dysphagia: Speech is following and now tolerating oral diet, core track discontinued on 10/27/2020.  Acute hypoxic respiratory failure: In setting of ICH-extubated on 4/18-on room air.  Asymptomatic bradycardia: While in the ICU-thought to be due to Precedex.  Monitor for now.  COVID-19 infection: Asymptomatic-unclear if she has been vaccinated.  Finished her isolation on 10/26/2020.  Acute urinary retention: Foley catheter placed on 4/18-on Urecholine & Flomax, failed Foley removal trial on 10/26/2022 and Foley was reinserted on 10/26/2020.   Normocytic anemia: Suspect this is from acute illness-no evidence of blood loss from chart review.  Monitor for now.     Condition - Stable  Family Communication  :   Mother Wilnette Kales [phone] 10/25/20 at 10:07 AM message left, message left again on 10/26/2020 at 9:49 AM   Father Ree Kida - [phone] - on 10/26/20  Sister updated in detail on 10/27/2020 when she visited the hospital.   Code Status :  Full  Code  Diet :  Diet Order            Diet NPO time specified Except for: Sips with Meds  Diet effective midnight                  Disposition Plan  :  Status is: Inpatient  Remains inpatient appropriate because:Inpatient level of care appropriate due to severity of illness   Dispo: The patient is from: Home              Anticipated d/c is to: CIR              Patient currently is not medically stable to d/c.   Difficult to place patient No   Barriers to discharge: Dysphagia - NG tube in place  Antimicorbials  :    Anti-infectives (From admission, onward)   None      Inpatient Medications  Scheduled Meds: .  stroke: mapping our early stages of recovery book   Does not apply Once  . bethanechol  10 mg Per Tube TID  . chlorhexidine  15 mL Mouth Rinse BID  . Chlorhexidine Gluconate Cloth  6 each Topical Daily  . docusate  100 mg Per Tube BID  . heparin injection (subcutaneous)  5,000 Units Subcutaneous Q8H  . levETIRAcetam  500 mg Oral BID  . mouth rinse  15 mL Mouth Rinse q12n4p  . sennosides  5 mL Oral BID  . sodium chloride flush  10-40 mL Intracatheter Q12H  . tamsulosin  0.4 mg Oral Daily   Continuous Infusions:  PRN Meds:.acetaminophen **OR** acetaminophen (TYLENOL) oral liquid 160 mg/5 mL **OR** acetaminophen, artificial tears, bisacodyl, labetalol, [DISCONTINUED] ondansetron **OR** ondansetron (ZOFRAN) IV, polyethylene glycol, promethazine, sodium phosphate   Time Spent in minutes  25  See all Orders from today for further details   Susa Raring M.D on 10/28/2020 at 9:20 AM  To page go to [email or web address] - use universal password  [facility] -  Office  [phone]    Objective:   Vitals:   10/27/20 2029 10/27/20 2358 10/28/20 0340 10/28/20 0758  BP: 122/84 118/73 113/89 119/79  Pulse: (!) 103 93 98 86  Resp: Temp: 98.3 F (36.8 C) 97.9 F (36.6 C) 98.4 F (36.9 C)  97.8 F (36.6 C)  TempSrc: Oral Oral Oral   SpO2: 100%  100% 99% 100%  Weight:      Height:        Wt Readings from Last 3 Encounters:  10/17/20 72.6 kg  06/09/19 65.8 kg  09/05/18 70.4 kg     Intake/Output Summary (Last 24 hours) at 10/28/2020 0920 Last data filed at 10/27/2020 2029 Gross per 24 hour  Intake --  Output 900 ml  Net -900 ml     Physical Exam  Awake more alert, right sided weakness is improving, expressive and receptive aphasia likely slowly improving as well [place].AT,PERRAL Supple Neck,No JVD, No cervical lymphadenopathy appriciated.  Symmetrical Chest wall movement, Good air movement bilaterally, CTAB RRR,No Gallops, Rubs or new Murmurs, No Parasternal Heave +ve B.Sounds, Abd Soft, No tenderness, No organomegaly appriciated, No rebound - guarding or rigidity. No Cyanosis, Clubbing or edema, No new Rash or bruise     Data Review:    CBC  Recent Labs  Lab 10/24/20 0915 10/25/20 0512 10/26/20 0228 10/27/20 0600 10/28/20 0334  WBC 10.9* 9.5 8.6 8.7 8.5  HGB 9.4* 9.4* 9.7* 9.7* 9.9*  HCT 28.7* 28.6* 29.1* 29.0* 29.5*  PLT 246 252 296 344 360  MCV 92.0 92.6 91.5 91.8 91.0  MCH 30.1 30.4 30.5 30.7 30.6  MCHC 32.8 32.9 33.3 33.4 33.6  RDW 12.2 12.5 12.3 12.1 12.2  LYMPHSABS  --   --  2.7 2.3 2.8  MONOABS  --   --  1.4* 1.2* 0.9  EOSABS  --   --  0.1 0.1 0.1  BASOSABS  --   --  0.0 0.1 0.1    Chemistries  Recent Labs  Lab 10/22/20 0505 10/23/20 0500 10/24/20 0418 10/25/20 0512 10/26/20 0228 10/27/20 0600 10/28/20 0334  NA 147*   < > 138 137 136 138 137  K 3.2*   < > 3.9 4.0 4.1 4.6 4.4  CL 116*   < > 105 101 100 98 99  CO2 22   < > 24 28 29 29 27   GLUCOSE 93   < > 143* 121* 116* 103* 92  BUN 5*   < > 6 8 10 12 15   CREATININE 0.58   < > 0.53 0.53 0.53 0.66 0.69  CALCIUM 8.9   < > 9.4 9.4 9.7 10.2 10.0  MG 1.8  --  2.1  --  2.1 2.3 2.3  AST 30  --   --   --  48* 45* 52*  ALT 19  --   --   --  115* 111* 116*  ALKPHOS 51  --   --   --  81 85 86  BILITOT 0.7  --   --   --  0.5 0.6 0.7   < > =  values in this interval not displayed.   ------------------------------------------------------------------------------------------------------------------ No results for input(s): CHOL, HDL, LDLCALC, TRIG, CHOLHDL, LDLDIRECT in the last 72 hours.  Lab Results  Component Value Date   HGBA1C 5.6 10/18/2020   ------------------------------------------------------------------------------------------------------------------ No results for input(s): TSH, T4TOTAL, T3FREE, THYROIDAB in the last 72 hours.  Invalid input(s): FREET3 ------------------------------------------------------------------------------------------------------------------ No results for input(s): VITAMINB12, FOLATE, FERRITIN, TIBC, IRON, RETICCTPCT in the last 72 hours.  Coagulation profile Recent Labs  Lab 10/28/20 0334  INR 1.1    No results for input(s): DDIMER in the last 72 hours.  Cardiac Enzymes No results for input(s): CKMB, TROPONINI, MYOGLOBIN in the last 168 hours.  Invalid input(s): CK ------------------------------------------------------------------------------------------------------------------ No  results found for: BNP  Micro Results Recent Results (from the past 240 hour(s))  MRSA PCR Screening     Status: None   Collection Time: 10/18/20 10:29 AM   Specimen: Nasopharyngeal  Result Value Ref Range Status   MRSA by PCR NEGATIVE NEGATIVE Final    Comment:        The GeneXpert MRSA Assay (FDA approved for NASAL specimens only), is one component of a comprehensive MRSA colonization surveillance program. It is not intended to diagnose MRSA infection nor to guide or monitor treatment for MRSA infections. Performed at [facility], [address] [place], [place] [address]     Radiology Reports CT HEAD WO CONTRAST  Result Date: 10/21/2020 CLINICAL DATA:  Follow-up intracranial hemorrhage EXAM: CT HEAD WITHOUT CONTRAST TECHNIQUE: Contiguous axial images were obtained from the base  of the skull through the vertex without intravenous contrast. COMPARISON:  Three days ago FINDINGS: Brain: Evacuated left cerebral hemorrhage with drain in place. No evidence of recurrent hemorrhage. Swelling and pneumocephalus is diminished. No progression of intraventricular clot in the left lateral ventricle. No hydrocephalus. Drain has been retracted so that the tip is in the region of hemorrhagic insult. Vascular: Negative Skull: Craniotomy with scalp swelling.  No interval change. Sinuses/Orbits: Mild generalized mucosal thickening. IMPRESSION: 1. No recurrent hemorrhage. Decreased brain swelling and midline shift. 2. No hydrocephalus. Electronically Signed   By: Marnee Spring M.D.   On: 10/21/2020 06:53   CT HEAD WO CONTRAST  Result Date: 10/18/2020 CLINICAL DATA:  25 year old female code stroke presentation with large left hemisphere hemorrhage, intraventricular extension. Posturing on presentation. Postoperative day 1 status post craniotomy and evacuation of intracranial hematoma. EXAM: CT HEAD WITHOUT CONTRAST TECHNIQUE: Contiguous axial images were obtained from the base of the skull through the vertex without intravenous contrast. COMPARISON:  CT head, CTA head and neck 10/17/2020. FINDINGS: Brain: Small volume left side pneumocephalus. Vertex approach drainage catheter tracking to the left corona radiata and internal capsule. Substantial interval drainage of intra-axial hemorrhage previously centered at the posterior left middle frontal gyrus, anterior parietal lobe. Small volume residual intra-axial blood, in an area of about 3.4 cm along the course of the drainage catheter (series 3, image 28). Largely resolved regional mass effect. Largely resolved rightward midline shift. Trace extra-axial blood underlying the left craniotomy. Moderate volume left lateral intraventricular hemorrhage is stable. Small volume blood both the 3rd and 4th ventricles, more apparent. Right lateral ventricle appears  spared. No ventriculomegaly. Improved basilar cisterns. Partial suprasellar cistern effacement persists. No uncal herniation. No superimposed acute cortically based infarct identified. Vascular: No suspicious intracranial vascular hyperdensity. Skull: Left superior convexity craniotomy. Otherwise stable and intact. Sinuses/Orbits: Right nasoenteric tube remains in place. Fluid in the visible pharynx. Mild new paranasal sinus fluid and mucosal thickening. Tympanic cavities and mastoids remain clear. Other: Postoperative left scalp hematoma, superimposed small volume left scalp fluid and gas collection. Overlying skin staples. Orbits soft tissues remain negative. IMPRESSION: 1. Significantly evacuated left hemisphere hemorrhage by surgery. Percutaneous drain remains in place tracking toward the left internal capsule. Small volume (roughly 3.4 cm diameter) residual intra-axial blood, fluid, and gas. 2. Largely resolved intracranial mass effect and midline shift. Residual partially effaced suprasellar cistern. 3. Stable to minimally increased intraventricular hemorrhage. No ventriculomegaly. 4. Trace extra-axial blood underlying the craniotomy. Small volume left-side pneumocephalus. Postoperative scalp hematoma. 5. No new intracranial abnormality identified. Electronically Signed   By: Odessa Fleming M.D.   On: 10/18/2020 05:54   CT HEAD  WO CONTRAST  Result Date: 10/17/2020 CLINICAL DATA:  Patient is unresponsive and with abnormal posture EXAM: CT HEAD WITHOUT CONTRAST CT CERVICAL SPINE WITHOUT CONTRAST TECHNIQUE: Multidetector CT imaging of the head and cervical spine was performed following the standard protocol without intravenous contrast. Multiplanar CT image reconstructions of the cervical spine were also generated. COMPARISON:  None. FINDINGS: CT HEAD FINDINGS Brain: 5.6 by 3.8 by 4.4 cm (volume = 49 cm^3) acute left frontoparietal intraparenchymal hematoma is noted. This is decompressed into the left lateral  ventricle which likewise contains a substantial amount of acute blood products which also extend into the third ventricle. There is approximately 1.0 cm of left-to-right midline shift. Borderline accentuated density along the tentorium and falx but overall I am skeptical that this represents subarachnoid hemorrhage. Effacement of the basilar cisterns including the suprasellar cistern and ambient cisterns. Vascular: Unremarkable Skull: Unremarkable Sinuses/Orbits: Mild chronic right maxillary sinusitis. CT CERVICAL SPINE FINDINGS Alignment: Reversal the normal cervical lordosis but no subluxation. Skull base and vertebrae: No fracture or acute bony findings. Soft tissues and spinal canal: Unremarkable Disc levels:  No bony impingement. Upper chest: Unremarkable Other: Endotracheal and nasogastric tubes noted. IMPRESSION: 1. 5.6 cm in long axis acute left frontoparietal intraparenchymal hematoma, decompressing into the left lateral ventricle which contains a substantial amount of acute blood products which extend into the third ventricle. 1.0 cm of left-to-right midline shift with effacement of the basilar cisterns. 2. No acute cervical spine findings. 3. Chronic right maxillary sinusitis. Critical Value/emergent results were called by telephone at the time of interpretation on 10/17/2020 at 6:10 pm to provider Dr. Margarita Grizzle , who verbally acknowledged these results. Electronically Signed   By: Gaylyn Rong M.D.   On: 10/17/2020 18:19   CT Cervical Spine Wo Contrast  Result Date: 10/17/2020 CLINICAL DATA:  Patient is unresponsive and with abnormal posture EXAM: CT HEAD WITHOUT CONTRAST CT CERVICAL SPINE WITHOUT CONTRAST TECHNIQUE: Multidetector CT imaging of the head and cervical spine was performed following the standard protocol without intravenous contrast. Multiplanar CT image reconstructions of the cervical spine were also generated. COMPARISON:  None. FINDINGS: CT HEAD FINDINGS Brain: 5.6 by 3.8 by  4.4 cm (volume = 49 cm^3) acute left frontoparietal intraparenchymal hematoma is noted. This is decompressed into the left lateral ventricle which likewise contains a substantial amount of acute blood products which also extend into the third ventricle. There is approximately 1.0 cm of left-to-right midline shift. Borderline accentuated density along the tentorium and falx but overall I am skeptical that this represents subarachnoid hemorrhage. Effacement of the basilar cisterns including the suprasellar cistern and ambient cisterns. Vascular: Unremarkable Skull: Unremarkable Sinuses/Orbits: Mild chronic right maxillary sinusitis. CT CERVICAL SPINE FINDINGS Alignment: Reversal the normal cervical lordosis but no subluxation. Skull base and vertebrae: No fracture or acute bony findings. Soft tissues and spinal canal: Unremarkable Disc levels:  No bony impingement. Upper chest: Unremarkable Other: Endotracheal and nasogastric tubes noted. IMPRESSION: 1. 5.6 cm in long axis acute left frontoparietal intraparenchymal hematoma, decompressing into the left lateral ventricle which contains a substantial amount of acute blood products which extend into the third ventricle. 1.0 cm of left-to-right midline shift with effacement of the basilar cisterns. 2. No acute cervical spine findings. 3. Chronic right maxillary sinusitis. Critical Value/emergent results were called by telephone at the time of interpretation on 10/17/2020 at 6:10 pm to provider Dr. Margarita Grizzle , who verbally acknowledged these results. Electronically Signed   By: Gaylyn Rong M.D.   On: 10/17/2020  18:19   MR BRAIN W WO CONTRAST  Result Date: 10/19/2020 CLINICAL DATA:  Cerebral hemorrhage suspected EXAM: MRI HEAD WITHOUT AND WITH CONTRAST MRA HEAD WITHOUT AND WITH CONTRAST TECHNIQUE: Multiplanar, multiecho pulse sequences of the brain and surrounding structures were obtained without and with intravenous contrast. Angiographic images of the head  were obtained using MRV technique without and with contrast. CONTRAST:  7.60mL GADAVIST GADOBUTROL 1 MMOL/ML IV SOLN COMPARISON:  Head CT from yesterday. CTA of the head neck from 2 days ago FINDINGS: MRI HEAD FINDINGS Brain: Evacuated left parietal hematoma with expected remaining fluid and blood products. Pneumocephalus has significantly decreased. Midline shift is resolved although there is still signs of elevated intracranial pressure with diffuse effacement of CSF spaces. Intraventricular extension of blood clot. Left-sided drain which traverses the hematoma, tip near the left cerebral peduncle. The catheter does not directly communicate with the ventricular system which is nondilated. No masslike enhancement is seen adjacent to the hemorrhage. There is cortical restricted diffusion about the operative cavity. Accentuated vessels along the superficial aspect of the hematoma cavity, usually reactive. Vascular: As above.  Enhancing dural venous sinuses. Skull and upper cervical spine: Unremarkable craniotomy. Sinuses/Orbits: Negative MRV HEAD FINDINGS No dural venous sinus thrombosis is seen. Narrow right transverse and sigmoid sinuses attributed to developmental variation. No evidence of cortical vein thrombosis or shunting. IMPRESSION: Brain MRI: 1. No mass or definite vascular lesion underlying the evacuated left parietal hematoma. There is accentuated intravascular enhancement along the superficial cavity which may be reactive; catheter angiography would be contributory. 2. Resolved midline shift. No hydrocephalus. MRV: No evidence for venous thrombosis. Electronically Signed   By: Marnee Spring M.D.   On: 10/19/2020 05:50   DG Chest Portable 1 View  Result Date: 10/17/2020 CLINICAL DATA:  ET tube check EXAM: PORTABLE CHEST 1 VIEW COMPARISON:  Chest radiograph 10/18/2018 at 5:52 p.m. FINDINGS: Interval retraction of the endotracheal tube with tip approximately 1.5 cm above the carina. Enteric catheter in  place. Stable cardiomediastinal contours. Low lung volumes but otherwise clear. No pneumothorax or pleural effusion. No acute finding in the visualized skeleton. IMPRESSION: Interval retraction of the endotracheal tube with tip approximately 1.5 cm above the carina. Electronically Signed   By: Emmaline Kluver M.D.   On: 10/17/2020 19:06   DG Chest Port 1 View  Result Date: 10/17/2020 CLINICAL DATA:  Unresponsive. EXAM: PORTABLE CHEST 1 VIEW COMPARISON:  None. FINDINGS: Single frontal view of the chest demonstrates endotracheal tube overlying tracheal air column, tip overlying the right mainstem bronchus. Recommend retracting endotracheal tube approximately 4 cm to ensure adequate placement above the carina. Enteric catheter passes below diaphragm tip and side port projecting over gastric fundus. Cardiac silhouette is unremarkable. No airspace disease, effusion, or pneumothorax. There are no acute bony abnormalities. IMPRESSION: 1. Right mainstem intubation. Recommend retracting endotracheal tube approximately 4 cm to ensure adequate placement above carina. 2. No acute intrathoracic process. Critical Value/emergent results were called by telephone at the time of interpretation on 10/17/2020 at 6:09 pm to provider [facility] RAY , who verbally acknowledged these results. Electronically Signed   By: Sharlet Salina M.D.   On: 10/17/2020 18:09   DG Abd Portable 1V  Result Date: 10/18/2020 CLINICAL DATA:  Status post nasogastric tube placement EXAM: PORTABLE ABDOMEN - 1 VIEW COMPARISON:  None. FINDINGS: Nasogastric tube appears adequately positioned in the stomach with tip directed towards the stomach fundus. IMPRESSION: Nasogastric tube adequately positioned in the stomach. Electronically Signed   By: Weyman Croon  Linde Gillis M.D.   On: 10/18/2020 12:36   MR MRV HEAD W WO CONTRAST  Result Date: 10/19/2020 CLINICAL DATA:  Cerebral hemorrhage suspected EXAM: MRI HEAD WITHOUT AND WITH CONTRAST MRA HEAD WITHOUT AND WITH  CONTRAST TECHNIQUE: Multiplanar, multiecho pulse sequences of the brain and surrounding structures were obtained without and with intravenous contrast. Angiographic images of the head were obtained using MRV technique without and with contrast. CONTRAST:  7.41mL GADAVIST GADOBUTROL 1 MMOL/ML IV SOLN COMPARISON:  Head CT from yesterday. CTA of the head neck from 2 days ago FINDINGS: MRI HEAD FINDINGS Brain: Evacuated left parietal hematoma with expected remaining fluid and blood products. Pneumocephalus has significantly decreased. Midline shift is resolved although there is still signs of elevated intracranial pressure with diffuse effacement of CSF spaces. Intraventricular extension of blood clot. Left-sided drain which traverses the hematoma, tip near the left cerebral peduncle. The catheter does not directly communicate with the ventricular system which is nondilated. No masslike enhancement is seen adjacent to the hemorrhage. There is cortical restricted diffusion about the operative cavity. Accentuated vessels along the superficial aspect of the hematoma cavity, usually reactive. Vascular: As above.  Enhancing dural venous sinuses. Skull and upper cervical spine: Unremarkable craniotomy. Sinuses/Orbits: Negative MRV HEAD FINDINGS No dural venous sinus thrombosis is seen. Narrow right transverse and sigmoid sinuses attributed to developmental variation. No evidence of cortical vein thrombosis or shunting. IMPRESSION: Brain MRI: 1. No mass or definite vascular lesion underlying the evacuated left parietal hematoma. There is accentuated intravascular enhancement along the superficial cavity which may be reactive; catheter angiography would be contributory. 2. Resolved midline shift. No hydrocephalus. MRV: No evidence for venous thrombosis. Electronically Signed   By: Marnee Spring M.D.   On: 10/19/2020 05:50   ECHOCARDIOGRAM COMPLETE  Result Date: 10/20/2020    ECHOCARDIOGRAM REPORT   Patient Name:   NECIA KAMM Date of Exam: 10/19/2020 Medical Rec #:  [id number]        Height:       68.0 in Accession #:    [id number]       Weight:       160.0 lb Date of Birth:  1996/07/03        BSA:          1.859 m Patient Age:    24 years         BP:           100/72 mmHg Patient Gender: F                HR:           71 bpm. Exam Location:  Inpatient Procedure: 2D Echo, Cardiac Doppler and Color Doppler Indications:    Stroke I63.9  History:        Patient has no prior history of Echocardiogram examinations.                 Covid 19 positive.  Sonographer:    Roosvelt Maser RDCS Referring Phys: [id number] IMPRESSIONS  1. Left ventricular ejection fraction, by estimation, is 55 to 60%. The left ventricle has normal function. The left ventricle has no regional wall motion abnormalities. Left ventricular diastolic function could not be evaluated.  2. Right ventricular systolic function is normal. The right ventricular size is normal.  3. The mitral valve is normal in structure. No evidence of mitral valve regurgitation.  4. The aortic valve is normal in structure. Aortic valve regurgitation is not visualized. Comparison(s): No  prior Echocardiogram. Conclusion(s)/Recommendation(s): Otherwise normal echocardiogram, with minor abnormalities described in the report. FINDINGS  Left Ventricle: Left ventricular ejection fraction, by estimation, is 55 to 60%. The left ventricle has normal function. The left ventricle has no regional wall motion abnormalities. The left ventricular internal cavity size was normal in size. There is  no left ventricular hypertrophy. Left ventricular diastolic function could not be evaluated. Right Ventricle: The right ventricular size is normal. No increase in right ventricular wall thickness. Right ventricular systolic function is normal. Left Atrium: Left atrial size was normal in size. Right Atrium: Right atrial size was normal in size. Pericardium: There is no evidence of pericardial effusion. Mitral  Valve: The mitral valve is normal in structure. No evidence of mitral valve regurgitation. Tricuspid Valve: The tricuspid valve is normal in structure. Tricuspid valve regurgitation is not demonstrated. Aortic Valve: The aortic valve is normal in structure. Aortic valve regurgitation is not visualized. Pulmonic Valve: The pulmonic valve was not assessed. Pulmonic valve regurgitation is not visualized. Aorta: The aortic root is normal in size and structure. IAS/Shunts: The atrial septum is grossly normal. Riley Lam MD Electronically signed by Riley Lam MD Signature Date/Time: 10/20/2020/12:04:00 PM    Final    VAS [place] LOWER EXTREMITY VENOUS (DVT)  Result Date: 10/21/2020  Lower Venous DVT Study Indications: Stroke.  Risk Factors: ICH, Covid+. Comparison Study: No previous exams Performing Technologist: Ernestene Mention  Examination Guidelines: A complete evaluation includes B-mode imaging, spectral Doppler, color Doppler, and power Doppler as needed of all accessible portions of each vessel. Bilateral testing is considered an integral part of a complete examination. Limited examinations for reoccurring indications may be performed as noted. The reflux portion of the exam is performed with the patient in reverse Trendelenburg.  +---------+---------------+---------+-----------+----------+--------------+ RIGHT    CompressibilityPhasicitySpontaneityPropertiesThrombus Aging +---------+---------------+---------+-----------+----------+--------------+ CFV      Full           Yes      Yes                                 +---------+---------------+---------+-----------+----------+--------------+ SFJ      Full                                                        +---------+---------------+---------+-----------+----------+--------------+ FV Prox  Full           Yes      Yes                                 +---------+---------------+---------+-----------+----------+--------------+  FV Mid   Full           Yes      Yes                                 +---------+---------------+---------+-----------+----------+--------------+ FV DistalFull           Yes      Yes                                 +---------+---------------+---------+-----------+----------+--------------+ PFV      Full                                                        +---------+---------------+---------+-----------+----------+--------------+  POP      Full           Yes      Yes                                 +---------+---------------+---------+-----------+----------+--------------+ PTV      Full                                                        +---------+---------------+---------+-----------+----------+--------------+ PERO     Full                                                        +---------+---------------+---------+-----------+----------+--------------+   +---------+---------------+---------+-----------+----------+--------------+ LEFT     CompressibilityPhasicitySpontaneityPropertiesThrombus Aging +---------+---------------+---------+-----------+----------+--------------+ CFV      Full           Yes      Yes                                 +---------+---------------+---------+-----------+----------+--------------+ SFJ      Full                                                        +---------+---------------+---------+-----------+----------+--------------+ FV Prox  Full           Yes      Yes                                 +---------+---------------+---------+-----------+----------+--------------+ FV Mid   Full           Yes      Yes                                 +---------+---------------+---------+-----------+----------+--------------+ FV DistalFull           Yes      Yes                                 +---------+---------------+---------+-----------+----------+--------------+ PFV      Full                                                         +---------+---------------+---------+-----------+----------+--------------+ POP      Full           Yes      Yes                                 +---------+---------------+---------+-----------+----------+--------------+ PTV  Full                                                        +---------+---------------+---------+-----------+----------+--------------+ PERO     Full                                                        +---------+---------------+---------+-----------+----------+--------------+     Summary: BILATERAL: - No evidence of deep vein thrombosis seen in the lower extremities, bilaterally. - No evidence of superficial venous thrombosis in the lower extremities, bilaterally. -No evidence of popliteal cyst, bilaterally.   *See table(s) above for measurements and observations. Electronically signed by Gretta Began MD on 10/21/2020 at 5:10:53 PM.    Final    [place] EKG SITE RITE  Result Date: 10/19/2020 If Site Rite image not attached, placement could not be confirmed due to current cardiac rhythm.  CT ANGIO HEAD NECK W WO CM (CODE STROKE)  Result Date: 10/17/2020 CLINICAL DATA:  Patient unresponsive with abnormal posture. EXAM: CT HEAD WITHOUT CONTRAST CT ANGIOGRAPHY HEAD AND NECK TECHNIQUE: Multidetector CT imaging of the head and neck was performed using the standard protocol during bolus administration of intravenous contrast. Multiplanar CT image reconstructions and MIPs were obtained to evaluate the vascular anatomy. Carotid stenosis measurements (when applicable) are obtained utilizing NASCET criteria, using the distal internal carotid diameter as the denominator. CONTRAST:  75mL OMNIPAQUE IOHEXOL 350 MG/ML SOLN COMPARISON:  CT head/cervical spine performed earlier today 10/17/2020. FINDINGS: CTA NECK FINDINGS Aortic arch: Standard aortic branching. The visualized aortic arch is normal in caliber. No hemodynamically significant innominate or  proximal subclavian artery stenosis. Right carotid system: CCA and ICA patent within the neck without stenosis. Left carotid system: CCA and ICA patent within the neck without stenosis. Vertebral arteries: The intracranial vertebral arteries are patent. Skeleton: No acute bony abnormality or aggressive osseous lesion. Other neck: No neck mass or cervical lymphadenopathy. Upper chest: No consolidation within the imaged lung apices. Partially imaged ET tube extending into the right mainstem bronchus. Partially imaged enteric tube. Review of the MIP images confirms the above findings CTA HEAD FINDINGS Anterior circulation: The intracranial internal carotid arteries are patent. The M1 middle cerebral arteries are patent. No M2 proximal branch occlusion or high-grade proximal stenosis is identified. The anterior cerebral arteries are patent. No intracranial aneurysm is identified. No evidence of AVM. No contrast blush at the site of the patient's left cerebral hemisphere acute parenchymal hematoma. Posterior circulation: The intracranial vertebral arteries are patent. The basilar artery is patent. The posterior cerebral arteries are patent. Venous sinuses: The dural venous sinuses are poorly assessed due to contrast bolus timing. Anatomic variants: None significant Review of the MIP images confirms the above findings Findings of right mainstem bronchus intubation called by telephone at the time of interpretation on 10/17/2020 at 6:31 pm to provider [facility] RAY , who verbally acknowledged these results. IMPRESSION: CTA neck: 1. The common carotid, internal carotid and vertebral arteries are patent within the neck without stenosis. No evidence of dissection or aneurysm. 2. Right mainstem intubation. CTA head: 1. No intracranial aneurysm or AVM is identified. However, a contrast-enhanced brain MRI and MR angiography should  be considered once the hematoma involute to further evaluate for any underlying lesion. 2. No contrast  blush in the region of the left cerebral hemisphere acute parenchymal hematoma. 3. No intracranial large vessel occlusion or proximal high-grade arterial stenosis. Electronically Signed   By: Jackey Loge DO   On: 10/17/2020 18:39

## 2020-10-28 NOTE — Progress Notes (Signed)
STROKE TEAM PROGRESS NOTE   SUBJECTIVE (INTERVAL HISTORY) No acute events.  No visitors at bedside.  Now off Covid isolation. IR planning to proceed with angiogram, likely today. NPO for procedure.  Jane Martin denied new concerns today. We explained plan of care, including angiogram. Jane Martin was attentive and indicated understanding with nodding.  OBJECTIVE Vitals:   10/27/20 1602 10/27/20 2029 10/27/20 2358 10/28/20 0340  BP: 127/88 122/84 118/73 113/89  Pulse: (!) 106 (!) 103 93 98  Resp: 18 20 17 17   Temp: 98 F (36.7 C) 98.3 F (36.8 C) 97.9 F (36.6 C) 98.4 F (36.9 C)  TempSrc:  Oral Oral Oral  SpO2: 100% 100% 100% 99%  Weight:      Height:       CBC:  Recent Labs  Lab 10/27/20 0600 10/28/20 0334  WBC 8.7 8.5  NEUTROABS 5.1 4.4  HGB 9.7* 9.9*  HCT 29.0* 29.5*  MCV 91.8 91.0  PLT 344 360   Basic Metabolic Panel:  Recent Labs  Lab 10/24/20 0418 10/25/20 0512 10/27/20 0600 10/28/20 0334  NA 138   < > 138 137  K 3.9   < > 4.6 4.4  CL 105   < > 98 99  CO2 24   < > 29 27  GLUCOSE 143*   < > 103* 92  BUN 6   < > 12 15  CREATININE 0.53   < > 0.66 0.69  CALCIUM 9.4   < > 10.2 10.0  MG 2.1   < > 2.3 2.3  PHOS 3.3  --   --   --    < > = values in this interval not displayed.   Lipid Panel:  No results for input(s): CHOL, TRIG, HDL, CHOLHDL, VLDL, LDLCALC in the last 168 hours. HgbA1c:  Lab Results  Component Value Date   HGBA1C 5.6 10/18/2020   Urine Drug Screen:     Component Value Date/Time   LABOPIA NONE DETECTED 10/18/2020 0030   COCAINSCRNUR NONE DETECTED 10/18/2020 0030   LABBENZ NONE DETECTED 10/18/2020 0030   AMPHETMU NONE DETECTED 10/18/2020 0030   THCU POSITIVE (A) 10/18/2020 0030   LABBARB NONE DETECTED 10/18/2020 0030    Alcohol Level     Component Value Date/Time   ETH <10 10/17/2020 1740   IMAGING CT head 4/15 left frontoparietal intraparenchymal hematoma, with IVH. 1.0 cm of left-to-right midline shift with effacement of the basilar  cisterns  CTA Head and neck  4/15 Unremarkable, no AVM or aneurysm  Brain MRI 4/17 No mass or definite vascular lesion underlying the evacuated left parietal hematoma. There is accentuated intravascular enhancement along the superficial cavity which may be reactive; catheter angiography would be contributory. Resolved midline shift. No hydrocephalus  MRV 4/17 No evidence for venous thrombosis    Repeat CT head 4/19 No recurrent hemorrhage. Decreased brain swelling and midline shift. No hydrocephalus.  2D Echo 1. Left ventricular ejection fraction, by estimation, is 55 to 60%. The  left ventricle has normal function. The left ventricle has no regional  wall motion abnormalities. Left ventricular diastolic function could not  be evaluated.  2. Right ventricular systolic function is normal. The right ventricular  size is normal.  3. The mitral valve is normal in structure. No evidence of mitral valve  regurgitation.  4. The aortic valve is normal in structure. Aortic valve regurgitation is not visualized.   PHYSICAL EXAM  Temp:  [97.9 F (36.6 C)-98.4 F (36.9 C)] 98.4 F (36.9 C) (04/26 0340)  Pulse Rate:  [93-111] 98 (04/26 0340) Resp:  [17-20] 17 (04/26 0340) BP: (112-127)/(73-89) 113/89 (04/26 0340) SpO2:  [99 %-100 %] 99 % (04/26 0340)  General - Well nourished, well developed, in no apparent distress.  Ophthalmologic - fundi not visualized due to noncooperation.  Cardiovascular - Regular rhythm and rate.  Neuro -Awake alert, able to state Jane Martin is in [place].  Jane Martin follows commands briskly.  Expressive >receptive aphasia persists. Extraocular movements are full.  Facial muscles are symmetric.  Jane Martin has 4+/5 strength throughout except right upper extremity which Jane Martin is 4 -.   Jane Martin does have a significant drift right upper extremity but no drift of the other extremities.  Impaired right finger-to-nose and needle coordination.  Significant weakness of right grip and  intrinsic hand muscles.  ASSESSMENT/PLAN [name] is a 25 y.o. female with no history, presented to ED by EMS after collapsing at a gas station. It was told that Jane Martin yelled out for help and for someone to call 911, then collapsed to the ground. When EMS arrived, Jane Martin was unresponsive which worsened to comatose. Intubated in ED.    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 Etiology unknown.  Repeat HCT stable 4/19  On Keppra s/p craniotomy   VTE prophylaxis - heparin SQ  No antithrombotic prior to admission, now on No antithrombotic  Therapy recommendations:  CIR  Disposition:  TBD  Cerebral edema  CT and MRI post hematoma evaluation showed resolved midline shift  3% saline weaned to off   Na 146-153-148->149->147->141->137  ? Abnormal brain tissue  Per Dr. Danielle Dess path report showed abnormal brain tissue.   Pathology negative so far  MRI - there is accentuated intravascular enhancement along the superficial cavity which may be reactive; catheter angiography may be considered when patient is more stable.  Cerebral angiogram is pending  Respiratory failure  Extubated 10/20/20  Tolerating well, on room air  ?? Hypertension . BP goal < 160 . BP stable   Asymptomatic bradycardia  Seemed to worsen with precedex, precedex off, now resolved  HR regular rate and rhythm now.  Agitation  PRN clonazepam, will try to avoid IV sedation.   Birth control  Mom stated that pt has a hormone depo for birth control  Not sure the name  Concerning both estrogen and progesterone containing  Pt not smoker but vaping - mom not sure if nicotine containing  MRV showed no evidence for cerebral sinus venous thrombosis.  COVID positive  Asymptomatic  Mom does not know any potential exposure  On isolation through 4/25 (10 days)  Acute blood loss anemia  Hgb 9.6->8.8->8.1->7.0->7.6->8.7->9.4  Continue to monitor,  stable   Dysphagia, Feeding/Nutrition  Cortrak tube re-placed  On clear liquid diet but not eating much  TF Osmolite per protocol  Dietitian on board  Urinary Retention  Foley placed 4/18  On urecholine  Other Stroke Risk Factors  Vaping - ? Nicotine or non-nicotine (mom not sure)   Family hx stroke (Mom had a stroke)  Hypokalemia  improved  3.3->3.9->4.0  Other Active Problems  Hospital day # 11  Continue ongoing current treatment.  Mobilize out of bed.  Ongoing therapies.  Transfer to inpatient rehab when bed available.  Diagnostic cerebral angiogram scheduled for later today.  Discussed with Dr. Corliss Skains intervention neuroradiologist.  Greater than 50% time during this 25-minute visit was spent on counseling and coordination of care about her intracerebral hemorrhage and discussion about evaluation  and treatment plan and answering questions.  Delia Heady MD

## 2020-10-28 NOTE — Progress Notes (Signed)
NIR consulted by Dr. Pearlean Brownie for possible image-guided diagnostic cerebral arteriogram.  Please see consult note from Alwyn Ren, NP 10/27/2020 for full work-up. Procedure was scheduled tentatively for today in NIR. Unfortunately, unable to accommodate patient's procedure today secondary to emergent Code Stroke. Plan for possible image-guided diagnostic cerebral arteriogram in IR tentatively for tomorrow 10/29/2020 pending IR scheduling. Patient's diet restarted for today, patient will be NPO at midnight. Neurology and 3W RN updated.  Please call NIR with questions/concerns.   Waylan Boga Patches Mcdonnell, PA-C 10/28/2020, 1:30 PM

## 2020-10-28 NOTE — Progress Notes (Addendum)
Nutrition Follow-up  DOCUMENTATION CODES:   Not applicable  INTERVENTION:  Obtain new weight  Ensure Enlive po BID, each supplement provides 350 kcal and 20 grams of protein  NUTRITION DIAGNOSIS:   Increased nutrient needs related to post-op healing as evidenced by estimated needs.  ongoing  GOAL:   Patient will meet greater than or equal to 90% of their needs  progressing  MONITOR:   PO intake,Supplement acceptance,Weight trends,Labs  REASON FOR ASSESSMENT:   Consult Enteral/tube feeding initiation and management  ASSESSMENT:   Pt with no PMH admitted after collapsing at gas station with ICH, left parietal region with intraventricular extension, herniation syndrome s/p craniotomy for evacuation. Per neuro etiology unknown.  4/15 - intubated; NG placed; s/p craniotomy and hematoma evacuation 4/18 - extubated; cortrak placed tip gastric 4/19 - EVD removed 4/20 - cortrak replaced (tip gastric); diet advanced to clear liquids 4/21 - diet advanced to full liquids 4/22 - tx to [facility] 4/24 - finished COVID isolation 4/25 - cortrak removed; diet advanced to regular  Pt having loud argument with someone at time of RD visit and was unable to answer RD questions at this time. Per RN, pt tolerating diet. SLP following. Per MD, pt likely to discharge within the next 1-2 days after cerebral angiogram (tentatively scheduled in IR for tomorrow).   No PO intake documented.   UOP: x24 hours I/O: -2792.58ml since admit  Weight has not been updated since admit. Recommend obtaining new measured weight.   Medications: colace, senokot Labs reviewed. CBGs [phone]  Diet Order:   Diet Order            Diet NPO time specified Except for: Sips with Meds  Diet effective midnight           Diet regular Room service appropriate? Yes; Fluid consistency: Thin  Diet effective now                 EDUCATION NEEDS:   No education needs have been identified at this  time  Skin:  Skin Assessment: Reviewed RN Assessment  Last BM:  4/24 type 6  Height:   Ht Readings from Last 1 Encounters:  10/18/20 5\' 8"  (1.727 m)    Weight:   Wt Readings from Last 1 Encounters:  10/17/20 72.6 kg    Ideal Body Weight:  63.6 kg  BMI:  Body mass index is 24.33 kg/m.  Estimated Nutritional Needs:   Kcal:  2000-2200  Protein:  95-115 grams  Fluid:  > 2 L/day    10/19/20, MS, RD, LDN RD pager number and weekend/on-call pager number located in Amion.

## 2020-10-29 ENCOUNTER — Inpatient Hospital Stay (HOSPITAL_COMMUNITY): Payer: 59

## 2020-10-29 DIAGNOSIS — W19XXXA Unspecified fall, initial encounter: Secondary | ICD-10-CM

## 2020-10-29 HISTORY — PX: IR ANGIO VERTEBRAL SEL VERTEBRAL BILAT MOD SED: IMG5369

## 2020-10-29 HISTORY — PX: IR US GUIDE VASC ACCESS RIGHT: IMG2390

## 2020-10-29 HISTORY — PX: IR ANGIO INTRA EXTRACRAN SEL COM CAROTID INNOMINATE BILAT MOD SED: IMG5360

## 2020-10-29 LAB — CBC WITH DIFFERENTIAL/PLATELET
Abs Immature Granulocytes: 0.06 10*3/uL (ref 0.00–0.07)
Basophils Absolute: 0.1 10*3/uL (ref 0.0–0.1)
Basophils Relative: 1 %
Eosinophils Absolute: 0.1 10*3/uL (ref 0.0–0.5)
Eosinophils Relative: 1 %
HCT: 28 % — ABNORMAL LOW (ref 36.0–46.0)
Hemoglobin: 9.4 g/dL — ABNORMAL LOW (ref 12.0–15.0)
Immature Granulocytes: 1 %
Lymphocytes Relative: 29 %
Lymphs Abs: 2.5 10*3/uL (ref 0.7–4.0)
MCH: 30.5 pg (ref 26.0–34.0)
MCHC: 33.6 g/dL (ref 30.0–36.0)
MCV: 90.9 fL (ref 80.0–100.0)
Monocytes Absolute: 1 10*3/uL (ref 0.1–1.0)
Monocytes Relative: 11 %
Neutro Abs: 5 10*3/uL (ref 1.7–7.7)
Neutrophils Relative %: 57 %
Platelets: 400 10*3/uL (ref 150–400)
RBC: 3.08 MIL/uL — ABNORMAL LOW (ref 3.87–5.11)
RDW: 12.2 % (ref 11.5–15.5)
WBC: 8.7 10*3/uL (ref 4.0–10.5)
nRBC: 0 % (ref 0.0–0.2)

## 2020-10-29 LAB — COMPREHENSIVE METABOLIC PANEL
ALT: 113 U/L — ABNORMAL HIGH (ref 0–44)
AST: 47 U/L — ABNORMAL HIGH (ref 15–41)
Albumin: 3.6 g/dL (ref 3.5–5.0)
Alkaline Phosphatase: 91 U/L (ref 38–126)
Anion gap: 10 (ref 5–15)
BUN: 13 mg/dL (ref 6–20)
CO2: 30 mmol/L (ref 22–32)
Calcium: 10 mg/dL (ref 8.9–10.3)
Chloride: 96 mmol/L — ABNORMAL LOW (ref 98–111)
Creatinine, Ser: 0.7 mg/dL (ref 0.44–1.00)
GFR, Estimated: >60 mL/min (ref >60)
Glucose, Bld: 94 mg/dL (ref 70–99)
Potassium: 3.9 mmol/L (ref 3.5–5.1)
Sodium: 136 mmol/L (ref 135–145)
Total Bilirubin: 0.4 mg/dL (ref 0.3–1.2)
Total Protein: 7.3 g/dL (ref 6.5–8.1)

## 2020-10-29 LAB — MAGNESIUM: Magnesium: 2.2 mg/dL (ref 1.7–2.4)

## 2020-10-29 MED ORDER — HEPARIN SODIUM (PORCINE) 1000 UNIT/ML IJ SOLN
INTRAMUSCULAR | Status: AC | PRN
  Administered 2020-10-29: 2000 [IU] via INTRA_ARTERIAL

## 2020-10-29 MED ORDER — VERAPAMIL HCL 2.5 MG/ML IV SOLN
INTRAVENOUS | Status: AC | PRN
  Administered 2020-10-29: 2.5 mg via INTRAVENOUS

## 2020-10-29 MED ORDER — FENTANYL CITRATE (PF) 100 MCG/2ML IJ SOLN
INTRAMUSCULAR | Status: AC | PRN
  Administered 2020-10-29: 25 ug via INTRAVENOUS

## 2020-10-29 MED ORDER — FENTANYL CITRATE (PF) 100 MCG/2ML IJ SOLN
INTRAMUSCULAR | Status: AC
  Filled 2020-10-29: qty 2

## 2020-10-29 MED ORDER — DOCUSATE SODIUM 100 MG PO CAPS
100.0000 mg | ORAL_CAPSULE | Freq: Two times a day (BID) | ORAL | Status: DC
  Administered 2020-10-29 – 2020-10-30 (×2): 100 mg via ORAL
  Filled 2020-10-29 (×2): qty 1

## 2020-10-29 MED ORDER — IOHEXOL 300 MG/ML  SOLN
150.0000 mL | Freq: Once | INTRAMUSCULAR | Status: AC | PRN
  Administered 2020-10-29: 50 mL

## 2020-10-29 MED ORDER — MIDAZOLAM HCL 2 MG/2ML IJ SOLN
INTRAMUSCULAR | Status: AC | PRN
  Administered 2020-10-29: 1 mg via INTRAVENOUS

## 2020-10-29 MED ORDER — SODIUM CHLORIDE 0.9 % IV BOLUS
INTRAVENOUS | Status: AC | PRN
  Administered 2020-10-29: 250 mL via INTRAVENOUS

## 2020-10-29 MED ORDER — HEPARIN SODIUM (PORCINE) 1000 UNIT/ML IJ SOLN
INTRAMUSCULAR | Status: AC
  Filled 2020-10-29: qty 1

## 2020-10-29 MED ORDER — SODIUM CHLORIDE 0.9 % IV SOLN: INTRAVENOUS | Status: AC

## 2020-10-29 MED ORDER — LIDOCAINE HCL 1 % IJ SOLN
INTRAMUSCULAR | Status: AC
  Filled 2020-10-29: qty 20

## 2020-10-29 MED ORDER — LIDOCAINE HCL 1 % IJ SOLN
INTRAMUSCULAR | Status: AC | PRN
  Administered 2020-10-29: 5 mL

## 2020-10-29 MED ORDER — NITROGLYCERIN 1 MG/10 ML FOR IR/CATH LAB
INTRA_ARTERIAL | Status: AC | PRN
  Administered 2020-10-29 (×2): 200 ug via INTRA_ARTERIAL

## 2020-10-29 MED ORDER — MIDAZOLAM HCL 2 MG/2ML IJ SOLN
INTRAMUSCULAR | Status: AC
  Filled 2020-10-29: qty 2

## 2020-10-29 MED ORDER — NITROGLYCERIN 1 MG/10 ML FOR IR/CATH LAB
INTRA_ARTERIAL | Status: AC
  Filled 2020-10-29: qty 10

## 2020-10-29 MED ORDER — VERAPAMIL HCL 2.5 MG/ML IV SOLN
INTRAVENOUS | Status: AC
  Filled 2020-10-29: qty 2

## 2020-10-29 MED ORDER — IOHEXOL 300 MG/ML  SOLN
100.0000 mL | Freq: Once | INTRAMUSCULAR | Status: AC | PRN
  Administered 2020-10-29: 46 mL

## 2020-10-29 MED ORDER — SENNA 8.6 MG PO TABS: 1.0000 | ORAL_TABLET | Freq: Every evening | ORAL | Status: DC | PRN

## 2020-10-29 MED ORDER — MORPHINE SULFATE (PF) 2 MG/ML IV SOLN
1.0000 mg | Freq: Once | INTRAVENOUS | Status: AC
  Administered 2020-10-29: 1 mg via INTRAVENOUS
  Filled 2020-10-29: qty 1

## 2020-10-29 NOTE — Progress Notes (Signed)
PICC removal order: Coordinated with RN, she requested to leave PICC in place until angiogram is complete.

## 2020-10-29 NOTE — Progress Notes (Signed)
PT Cancellation Note  Patient Details Name: Jane Martin MRN: [id number] DOB: 07-16-1995   Cancelled Treatment:    Reason Eval/Treat Not Completed: Active bedrest order Patient on active bedrest until after 1600. PT re-attempt as time allows.   Matthew Pais A. Dan Humphreys PT, DPT Acute Rehabilitation Services Pager [phone] Office [phone]    Viviann Spare 10/29/2020, 2:13 PM

## 2020-10-29 NOTE — Progress Notes (Signed)
STROKE TEAM PROGRESS NOTE   SUBJECTIVE (INTERVAL HISTORY) No one is at the bedside.  Patient had diagnostic cerebral catheter angiogram done by Dr. Corliss Skains earlier today which showed no evidence of AVM, aneurysm or vascular stenosis or occlusion.  Dr. Corliss Skains recommends repeat CT angiogram in 2 to 3 months. Patient denies new concerns, aphasia much improved but still has expressive greater than receptive. Able to speak some full sentences.  Patient is medically stable to be transferred to inpatient rehab when bed available.  Vital signs are stable.  Neurological exam is unchanged  OBJECTIVE Vitals:   10/29/20 0945 10/29/20 0950 10/29/20 0955 10/29/20 1126  BP: 140/86 (!) 158/84 139/83 112/80  Pulse: 85 83 78 80  Resp: 19 20 16 18   Temp:      TempSrc:      SpO2: 100% 100% 100% 100%  Weight:      Height:       CBC:  Recent Labs  Lab 10/28/20 0334 10/29/20 0500  WBC 8.5 8.7  NEUTROABS 4.4 5.0  HGB 9.9* 9.4*  HCT 29.5* 28.0*  MCV 91.0 90.9  PLT 360 400   Basic Metabolic Panel:  Recent Labs  Lab 10/24/20 0418 10/25/20 0512 10/28/20 0334 10/29/20 0500  NA 138   < > 137 136  K 3.9   < > 4.4 3.9  CL 105   < > 99 96*  CO2 24   < > 27 30  GLUCOSE 143*   < > 92 94  BUN 6   < > 15 13  CREATININE 0.53   < > 0.69 0.70  CALCIUM 9.4   < > 10.0 10.0  MG 2.1   < > 2.3 2.2  PHOS 3.3  --   --   --    < > = values in this interval not displayed.   Lipid Panel:  No results for input(s): CHOL, TRIG, HDL, CHOLHDL, VLDL, LDLCALC in the last 168 hours. HgbA1c:  Lab Results  Component Value Date   HGBA1C 5.6 10/18/2020   Urine Drug Screen:     Component Value Date/Time   LABOPIA NONE DETECTED 10/18/2020 0030   COCAINSCRNUR NONE DETECTED 10/18/2020 0030   LABBENZ NONE DETECTED 10/18/2020 0030   AMPHETMU NONE DETECTED 10/18/2020 0030   THCU POSITIVE (A) 10/18/2020 0030   LABBARB NONE DETECTED 10/18/2020 0030    Alcohol Level     Component Value Date/Time   ETH <10  10/17/2020 1740   IMAGING CT head 4/15 left frontoparietal intraparenchymal hematoma, with IVH. 1.0 cm of left-to-right midline shift with effacement of the basilar cisterns  CTA Head and neck  4/15 Unremarkable, no AVM or aneurysm  Brain MRI 4/17 No mass or definite vascular lesion underlying the evacuated left parietal hematoma. There is accentuated intravascular enhancement along the superficial cavity which may be reactive; catheter angiography would be contributory. Resolved midline shift. No hydrocephalus  MRV 4/17 No evidence for venous thrombosis    Repeat CT head 4/19 No recurrent hemorrhage. Decreased brain swelling and midline shift. No hydrocephalus.  2D Echo 1. Left ventricular ejection fraction, by estimation, is 55 to 60%. The  left ventricle has normal function. The left ventricle has no regional  wall motion abnormalities. Left ventricular diastolic function could not  be evaluated.  2. Right ventricular systolic function is normal. The right ventricular  size is normal.  3. The mitral valve is normal in structure. No evidence of mitral valve  regurgitation.  4. The aortic valve is  normal in structure. Aortic valve regurgitation is not visualized.   PHYSICAL EXAM  Temp:  [98 F (36.7 C)-99.7 F (37.6 C)] 98.4 F (36.9 C) (04/27 0752) Pulse Rate:  [78-103] 80 (04/27 1126) Resp:  [12-20] 18 (04/27 1126) BP: (100-158)/(62-111) 112/80 (04/27 1126) SpO2:  [99 %-100 %] 100 % (04/27 1126)  General - Well nourished, well developed, in no apparent distress.  Ophthalmologic - fundi not visualized due to noncooperation.  Cardiovascular - Regular rhythm and rate.  Neuro -Awake alert, able to state she is in [place].  She follows commands briskly.  Expressive >receptive aphasia persists. Extraocular movements are full.  Facial muscles are symmetric.  She has 4+/5 strength throughout except right upper extremity which she is 4 -.   She does have a significant  drift right upper extremity but no drift of the other extremities.  Impaired right finger-to-nose and needle coordination.  Significant weakness of right grip and intrinsic hand muscles.  ASSESSMENT/PLAN Ms. EEVEE BORBON is a 24 y.o. female with no history, presented to ED by EMS after collapsing at a gas station. It was told that she yelled out for help and for someone to call 911, then collapsed to the ground. When EMS arrived, she was unresponsive which worsened to comatose. Intubated in ED.    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 Etiology unknown.  Repeat HCT stable 4/19  On Keppra s/p craniotomy   VTE prophylaxis - heparin SQ  No antithrombotic prior to admission, now on No antithrombotic  Therapy recommendations:  CIR  Disposition:  TBD  Cerebral edema  CT and MRI post hematoma evaluation showed resolved midline shift  3% saline weaned to off   Na 146-153-148->149->147->141->137  ? Abnormal brain tissue  Per Dr. Danielle Dess path report showed abnormal brain tissue.   Pathology negative so far  MRI - there is accentuated intravascular enhancement along the superficial cavity which may be reactive; catheter angiography may be considered when patient is more stable.   cerebral angiogram with Dr. Corliss Skains 10/29/2020 showed no evidence of AVM, aneurysm or stenosis and is normal Respiratory failure  Extubated 10/20/20  Tolerating well, on room air  ?? Hypertension . BP goal < 160 . BP stable   Asymptomatic bradycardia  Seemed to worsen with precedex, precedex off, now resolved  HR regular rate and rhythm now.  Agitation  PRN clonazepam, will try to avoid IV sedation.   Birth control  Mom stated that pt has a hormone depo for birth control  Not sure the name  Concerning both estrogen and progesterone containing  Pt not smoker but vaping - mom not sure if nicotine containing  MRV showed no evidence for  cerebral sinus venous thrombosis.  COVID positive  Asymptomatic  Mom does not know any potential exposure  On isolation through 4/25 (10 days)  Acute blood loss anemia  Hgb 9.6->8.8->8.1->7.0->7.6->8.7->9.4  Continue to monitor, stable   Dysphagia, Feeding/Nutrition  Cortrak tube re-placed  On clear liquid diet but not eating much  TF Osmolite per protocol  Dietitian on board  Urinary Retention  Foley placed 4/18  On urecholine  Other Stroke Risk Factors  Vaping - ? Nicotine or non-nicotine (mom not sure)   Family hx stroke (Mom had a stroke)  Hypokalemia  improved  3.3->3.9->4.0  Other Active Problems  Patient seems to be improving quite well.  Continue to mobilize out of bed and continue ongoing therapy and inpatient rehab after insurance  approval and bed availability...  Discussed with Dr. Corliss Skains.  Greater than 50% time during this 25-minute visit was spent in counseling and coordination of care discussion with care team.  Delia Heady, MD Hospital day # 12

## 2020-10-29 NOTE — Progress Notes (Signed)
PROGRESS NOTE                                                                                                                                                                                                             Patient Demographics:    Jane Martin, is a 25 y.o. female, DOB - [birth year], QZR:[id number]  Outpatient Primary MD for the patient is Obgyn, Ma Hillock   Admit date - 10/17/2020   LOS - 12  Chief Complaint  Patient presents with  . Fall       Brief Narrative: Patient is a 25 y.o. female with no PMHx who collapsed at a local gas station-when EMS arrived-she was [name] was intubated in the emergency room-CT head showed a ICH with midline shift-taken emergently to the OR-and subsequently managed in the neuro ICU.  COVID-19 vaccinated status: Not known  Significant Events: 4/15>> collapsed at a local gas station-intubated in ED-found to have ICH  4/15>> craniotomy and hematoma evacuation. 4/18>> extubated 4/22>> transfer to [facility]  Significant studies: 4/15>> CT head: 5.6 cm acute left frontoparietal intraparenchymal hemorrhage-extending into the third ventricle.  1 cm left to right midline shift. 4/15>> CT C-spine: No acute cervical spine findings. 4/15>> CTA head: No intracranial aneurysm-no large vessel occlusion 4/15>> CTA neck: No evidence of dissection/aneurysm-large vessels are patent 4/16>> UDS: Positive for tetrahydrocannabinol 4/16>> CT: Evacuated left hemisphere hematoma-drain in place-resolved intracranial mass-effect 4/17>> MRI brain: No mass/vascular lesion underlying evacuated left parietal hematoma 4/17>> MRV brain: No evidence of venous thrombosis 4/17>> Echo: EF 55-60% 4/18>> bilateral lower extremity Doppler: No DVT. 4/19>> CT head: No recurrent hemorrhage.  Decrease brain swelling/midline shift.   COVID-19 medications: None  Antibiotics: None  Microbiology  data: None  Procedures: 4/15>> craniotomy and hematoma evacuation.  Consults:  None  DVT prophylaxis: heparin injection 5,000 Units Start: 10/19/20 2200 SCDs Start: 10/17/20 2243     Subjective:   Patient in bed, appears comfortable, denies any headache, no fever, no chest pain or pressure, no shortness of breath , no abdominal pain. No new focal weakness.   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  Continues to have RLE>> RLE weakness.  Remains on Keppra & Tube feeds.  Will defer further work-up if necessary to neurology  service.  Likely will undergo cerebral angiogram by Dr. Corliss Skains on 10/29/2020. Defer management of this problem to neurology service. Approaching discharge in the next 1 to 2 days.  Dysphagia: Speech is following and now tolerating oral diet, core track discontinued on 10/27/2020.  Acute hypoxic respiratory failure: In setting of ICH-extubated on 4/18-on room air.  Asymptomatic bradycardia: While in the ICU-thought to be due to Precedex.  Monitor for now.  COVID-19 infection: Asymptomatic-unclear if she has been vaccinated.  Finished her isolation on 10/26/2020.  Acute urinary retention: Foley catheter placed on 4/18 - on Urecholine & Flomax, failed Foley removal trial on 10/26/2022 and Foley was reinserted on 10/26/2020, will try DC again.  Normocytic anemia: Suspect this is from acute illness-no evidence of blood loss from chart review.  Monitor for now.     Condition - Stable  Family Communication  :   Mother Wilnette Kales [phone] 10/25/20 at 10:07 AM message left, message left again on 10/26/2020 at 9:49 AM   Father Ree Kida - [phone] - on 10/26/20  Sister updated in detail on 10/27/2020 when she visited the hospital.   Code Status :  Full Code  Diet :  Diet Order            Diet NPO time  specified Except for: Sips with Meds  Diet effective midnight                  Disposition Plan  :  Status is: Inpatient  Remains inpatient appropriate because:Inpatient level of care appropriate due to severity of illness   Dispo: The patient is from: Home              Anticipated d/c is to: CIR              Patient currently is not medically stable to d/c.   Difficult to place patient No   Barriers to discharge: Dysphagia - NG tube in place  Antimicorbials  :    Anti-infectives (From admission, onward)   None      Inpatient Medications  Scheduled Meds: .  stroke: mapping our early stages of recovery book   Does not apply Once  . bethanechol  10 mg Oral TID  . chlorhexidine  15 mL Mouth Rinse BID  . Chlorhexidine Gluconate Cloth  6 each Topical Daily  . docusate  100 mg Oral BID  . feeding supplement  237 mL Oral BID BM  . fentaNYL      . heparin injection (subcutaneous)  5,000 Units Subcutaneous Q8H  . heparin sodium (porcine)      . levETIRAcetam  500 mg Oral BID  . lidocaine      . mouth rinse  15 mL Mouth Rinse q12n4p  . midazolam      .  morphine injection  1 mg Intravenous Once  . nitroGLYCERIN      . sennosides  5 mL Oral BID  . sodium chloride flush  10-40 mL Intracatheter Q12H  . tamsulosin  0.4 mg Oral Daily  . verapamil       Continuous Infusions:  PRN Meds:.acetaminophen **OR** acetaminophen (TYLENOL) oral liquid 160 mg/5 mL **OR** acetaminophen, artificial tears, bisacodyl, labetalol, [DISCONTINUED] ondansetron **OR** ondansetron (ZOFRAN) IV, polyethylene glycol, promethazine, sodium phosphate   Time Spent in minutes  25  See all Orders from today for further details   Susa Raring M.D on 10/29/2020 at 10:19 AM  To page go to [email or web address] - use universal password  [facility] -  Office  [phone]    Objective:   Vitals:   10/29/20 0940 10/29/20 0945 10/29/20 0950 10/29/20 0955  BP: (!) 144/77 140/86 (!) 158/84 139/83   Pulse: 89 85 83 78  Resp: 17 19 20 16   Temp:      TempSrc:      SpO2: 100% 100% 100% 100%  Weight:      Height:        Wt Readings from Last 3 Encounters:  10/17/20 72.6 kg  06/09/19 65.8 kg  09/05/18 70.4 kg     Intake/Output Summary (Last 24 hours) at 10/29/2020 1019 Last data filed at 10/29/2020 0427 Gross per 24 hour  Intake --  Output 850 ml  Net -850 ml     Physical Exam  Awake more alert, right sided weakness is improving, expressive and receptive aphasia likely slowly improving as well [place].AT,PERRAL Supple Neck,No JVD, No cervical lymphadenopathy appriciated.  Symmetrical Chest wall movement, Good air movement bilaterally, CTAB RRR,No Gallops, Rubs or new Murmurs, No Parasternal Heave +ve B.Sounds, Abd Soft, No tenderness, No organomegaly appriciated, No rebound - guarding or rigidity. No Cyanosis, Clubbing or edema, No new Rash or bruise    Data Review:    CBC  Recent Labs  Lab 10/25/20 0512 10/26/20 0228 10/27/20 0600 10/28/20 0334 10/29/20 0500  WBC 9.5 8.6 8.7 8.5 8.7  HGB 9.4* 9.7* 9.7* 9.9* 9.4*  HCT 28.6* 29.1* 29.0* 29.5* 28.0*  PLT 252 296 344 360 400  MCV 92.6 91.5 91.8 91.0 90.9  MCH 30.4 30.5 30.7 30.6 30.5  MCHC 32.9 33.3 33.4 33.6 33.6  RDW 12.5 12.3 12.1 12.2 12.2  LYMPHSABS  --  2.7 2.3 2.8 2.5  MONOABS  --  1.4* 1.2* 0.9 1.0  EOSABS  --  0.1 0.1 0.1 0.1  BASOSABS  --  0.0 0.1 0.1 0.1    Chemistries  Recent Labs  Lab 10/24/20 0418 10/25/20 0512 10/26/20 0228 10/27/20 0600 10/28/20 0334 10/29/20 0500  NA 138 137 136 138 137 136  K 3.9 4.0 4.1 4.6 4.4 3.9  CL 105 101 100 98 99 96*  CO2 24 28 29 29 27 30   GLUCOSE 143* 121* 116* 103* 92 94  BUN 6 8 10 12 15 13   CREATININE 0.53 0.53 0.53 0.66 0.69 0.70  CALCIUM 9.4 9.4 9.7 10.2 10.0 10.0  MG 2.1  --  2.1 2.3 2.3 2.2  AST  --   --  48* 45* 52* 47*  ALT  --   --  115* 111* 116* 113*  ALKPHOS  --   --  81 85 86 91  BILITOT  --   --  0.5 0.6 0.7 0.4    ------------------------------------------------------------------------------------------------------------------ No results for input(s): CHOL, HDL, LDLCALC, TRIG, CHOLHDL, LDLDIRECT in the last 72 hours.  Lab Results  Component Value Date   HGBA1C 5.6 10/18/2020   ------------------------------------------------------------------------------------------------------------------ No results for input(s): TSH, T4TOTAL, T3FREE, THYROIDAB in the last 72 hours.  Invalid input(s): FREET3 ------------------------------------------------------------------------------------------------------------------ No results for input(s): VITAMINB12, FOLATE, FERRITIN, TIBC, IRON, RETICCTPCT in the last 72 hours.  Coagulation profile Recent Labs  Lab 10/28/20 0334  INR 1.1    No results for input(s): DDIMER in the last 72 hours.  Cardiac Enzymes No results for input(s): CKMB, TROPONINI, MYOGLOBIN in the last 168 hours.  Invalid input(s): CK ------------------------------------------------------------------------------------------------------------------ No results found for: BNP  Micro Results No results found for this or any previous visit (from the past 240 hour(s)).  Radiology Reports CT HEAD WO  CONTRAST  Result Date: 10/21/2020 CLINICAL DATA:  Follow-up intracranial hemorrhage EXAM: CT HEAD WITHOUT CONTRAST TECHNIQUE: Contiguous axial images were obtained from the base of the skull through the vertex without intravenous contrast. COMPARISON:  Three days ago FINDINGS: Brain: Evacuated left cerebral hemorrhage with drain in place. No evidence of recurrent hemorrhage. Swelling and pneumocephalus is diminished. No progression of intraventricular clot in the left lateral ventricle. No hydrocephalus. Drain has been retracted so that the tip is in the region of hemorrhagic insult. Vascular: Negative Skull: Craniotomy with scalp swelling.  No interval change. Sinuses/Orbits: Mild generalized mucosal  thickening. IMPRESSION: 1. No recurrent hemorrhage. Decreased brain swelling and midline shift. 2. No hydrocephalus. Electronically Signed   By: Marnee Spring M.D.   On: 10/21/2020 06:53   CT HEAD WO CONTRAST  Result Date: 10/18/2020 CLINICAL DATA:  25 year old female code stroke presentation with large left hemisphere hemorrhage, intraventricular extension. Posturing on presentation. Postoperative day 1 status post craniotomy and evacuation of intracranial hematoma. EXAM: CT HEAD WITHOUT CONTRAST TECHNIQUE: Contiguous axial images were obtained from the base of the skull through the vertex without intravenous contrast. COMPARISON:  CT head, CTA head and neck 10/17/2020. FINDINGS: Brain: Small volume left side pneumocephalus. Vertex approach drainage catheter tracking to the left corona radiata and internal capsule. Substantial interval drainage of intra-axial hemorrhage previously centered at the posterior left middle frontal gyrus, anterior parietal lobe. Small volume residual intra-axial blood, in an area of about 3.4 cm along the course of the drainage catheter (series 3, image 28). Largely resolved regional mass effect. Largely resolved rightward midline shift. Trace extra-axial blood underlying the left craniotomy. Moderate volume left lateral intraventricular hemorrhage is stable. Small volume blood both the 3rd and 4th ventricles, more apparent. Right lateral ventricle appears spared. No ventriculomegaly. Improved basilar cisterns. Partial suprasellar cistern effacement persists. No uncal herniation. No superimposed acute cortically based infarct identified. Vascular: No suspicious intracranial vascular hyperdensity. Skull: Left superior convexity craniotomy. Otherwise stable and intact. Sinuses/Orbits: Right nasoenteric tube remains in place. Fluid in the visible pharynx. Mild new paranasal sinus fluid and mucosal thickening. Tympanic cavities and mastoids remain clear. Other: Postoperative left scalp  hematoma, superimposed small volume left scalp fluid and gas collection. Overlying skin staples. Orbits soft tissues remain negative. IMPRESSION: 1. Significantly evacuated left hemisphere hemorrhage by surgery. Percutaneous drain remains in place tracking toward the left internal capsule. Small volume (roughly 3.4 cm diameter) residual intra-axial blood, fluid, and gas. 2. Largely resolved intracranial mass effect and midline shift. Residual partially effaced suprasellar cistern. 3. Stable to minimally increased intraventricular hemorrhage. No ventriculomegaly. 4. Trace extra-axial blood underlying the craniotomy. Small volume left-side pneumocephalus. Postoperative scalp hematoma. 5. No new intracranial abnormality identified. Electronically Signed   By: Odessa Fleming M.D.   On: 10/18/2020 05:54   CT HEAD WO CONTRAST  Result Date: 10/17/2020 CLINICAL DATA:  Patient is unresponsive and with abnormal posture EXAM: CT HEAD WITHOUT CONTRAST CT CERVICAL SPINE WITHOUT CONTRAST TECHNIQUE: Multidetector CT imaging of the head and cervical spine was performed following the standard protocol without intravenous contrast. Multiplanar CT image reconstructions of the cervical spine were also generated. COMPARISON:  None. FINDINGS: CT HEAD FINDINGS Brain: 5.6 by 3.8 by 4.4 cm (volume = 49 cm^3) acute left frontoparietal intraparenchymal hematoma is noted. This is decompressed into the left lateral ventricle which likewise contains a substantial amount of acute blood products which also extend into the third ventricle. There is approximately 1.0 cm of left-to-right midline shift. Borderline accentuated density along  the tentorium and falx but overall I am skeptical that this represents subarachnoid hemorrhage. Effacement of the basilar cisterns including the suprasellar cistern and ambient cisterns. Vascular: Unremarkable Skull: Unremarkable Sinuses/Orbits: Mild chronic right maxillary sinusitis. CT CERVICAL SPINE FINDINGS  Alignment: Reversal the normal cervical lordosis but no subluxation. Skull base and vertebrae: No fracture or acute bony findings. Soft tissues and spinal canal: Unremarkable Disc levels:  No bony impingement. Upper chest: Unremarkable Other: Endotracheal and nasogastric tubes noted. IMPRESSION: 1. 5.6 cm in long axis acute left frontoparietal intraparenchymal hematoma, decompressing into the left lateral ventricle which contains a substantial amount of acute blood products which extend into the third ventricle. 1.0 cm of left-to-right midline shift with effacement of the basilar cisterns. 2. No acute cervical spine findings. 3. Chronic right maxillary sinusitis. Critical Value/emergent results were called by telephone at the time of interpretation on 10/17/2020 at 6:10 pm to provider Dr. Margarita Grizzle , who verbally acknowledged these results. Electronically Signed   By: Gaylyn Rong M.D.   On: 10/17/2020 18:19   CT Cervical Spine Wo Contrast  Result Date: 10/17/2020 CLINICAL DATA:  Patient is unresponsive and with abnormal posture EXAM: CT HEAD WITHOUT CONTRAST CT CERVICAL SPINE WITHOUT CONTRAST TECHNIQUE: Multidetector CT imaging of the head and cervical spine was performed following the standard protocol without intravenous contrast. Multiplanar CT image reconstructions of the cervical spine were also generated. COMPARISON:  None. FINDINGS: CT HEAD FINDINGS Brain: 5.6 by 3.8 by 4.4 cm (volume = 49 cm^3) acute left frontoparietal intraparenchymal hematoma is noted. This is decompressed into the left lateral ventricle which likewise contains a substantial amount of acute blood products which also extend into the third ventricle. There is approximately 1.0 cm of left-to-right midline shift. Borderline accentuated density along the tentorium and falx but overall I am skeptical that this represents subarachnoid hemorrhage. Effacement of the basilar cisterns including the suprasellar cistern and ambient  cisterns. Vascular: Unremarkable Skull: Unremarkable Sinuses/Orbits: Mild chronic right maxillary sinusitis. CT CERVICAL SPINE FINDINGS Alignment: Reversal the normal cervical lordosis but no subluxation. Skull base and vertebrae: No fracture or acute bony findings. Soft tissues and spinal canal: Unremarkable Disc levels:  No bony impingement. Upper chest: Unremarkable Other: Endotracheal and nasogastric tubes noted. IMPRESSION: 1. 5.6 cm in long axis acute left frontoparietal intraparenchymal hematoma, decompressing into the left lateral ventricle which contains a substantial amount of acute blood products which extend into the third ventricle. 1.0 cm of left-to-right midline shift with effacement of the basilar cisterns. 2. No acute cervical spine findings. 3. Chronic right maxillary sinusitis. Critical Value/emergent results were called by telephone at the time of interpretation on 10/17/2020 at 6:10 pm to provider Dr. Margarita Grizzle , who verbally acknowledged these results. Electronically Signed   By: Gaylyn Rong M.D.   On: 10/17/2020 18:19   MR BRAIN W WO CONTRAST  Result Date: 10/19/2020 CLINICAL DATA:  Cerebral hemorrhage suspected EXAM: MRI HEAD WITHOUT AND WITH CONTRAST MRA HEAD WITHOUT AND WITH CONTRAST TECHNIQUE: Multiplanar, multiecho pulse sequences of the brain and surrounding structures were obtained without and with intravenous contrast. Angiographic images of the head were obtained using MRV technique without and with contrast. CONTRAST:  7.55mL GADAVIST GADOBUTROL 1 MMOL/ML IV SOLN COMPARISON:  Head CT from yesterday. CTA of the head neck from 2 days ago FINDINGS: MRI HEAD FINDINGS Brain: Evacuated left parietal hematoma with expected remaining fluid and blood products. Pneumocephalus has significantly decreased. Midline shift is resolved although there is still signs of elevated intracranial  pressure with diffuse effacement of CSF spaces. Intraventricular extension of blood clot.  Left-sided drain which traverses the hematoma, tip near the left cerebral peduncle. The catheter does not directly communicate with the ventricular system which is nondilated. No masslike enhancement is seen adjacent to the hemorrhage. There is cortical restricted diffusion about the operative cavity. Accentuated vessels along the superficial aspect of the hematoma cavity, usually reactive. Vascular: As above.  Enhancing dural venous sinuses. Skull and upper cervical spine: Unremarkable craniotomy. Sinuses/Orbits: Negative MRV HEAD FINDINGS No dural venous sinus thrombosis is seen. Narrow right transverse and sigmoid sinuses attributed to developmental variation. No evidence of cortical vein thrombosis or shunting. IMPRESSION: Brain MRI: 1. No mass or definite vascular lesion underlying the evacuated left parietal hematoma. There is accentuated intravascular enhancement along the superficial cavity which may be reactive; catheter angiography would be contributory. 2. Resolved midline shift. No hydrocephalus. MRV: No evidence for venous thrombosis. Electronically Signed   By: Marnee Spring M.D.   On: 10/19/2020 05:50   DG Chest Portable 1 View  Result Date: 10/17/2020 CLINICAL DATA:  ET tube check EXAM: PORTABLE CHEST 1 VIEW COMPARISON:  Chest radiograph 10/18/2018 at 5:52 p.m. FINDINGS: Interval retraction of the endotracheal tube with tip approximately 1.5 cm above the carina. Enteric catheter in place. Stable cardiomediastinal contours. Low lung volumes but otherwise clear. No pneumothorax or pleural effusion. No acute finding in the visualized skeleton. IMPRESSION: Interval retraction of the endotracheal tube with tip approximately 1.5 cm above the carina. Electronically Signed   By: Emmaline Kluver M.D.   On: 10/17/2020 19:06   DG Chest Port 1 View  Result Date: 10/17/2020 CLINICAL DATA:  Unresponsive. EXAM: PORTABLE CHEST 1 VIEW COMPARISON:  None. FINDINGS: Single frontal view of the chest  demonstrates endotracheal tube overlying tracheal air column, tip overlying the right mainstem bronchus. Recommend retracting endotracheal tube approximately 4 cm to ensure adequate placement above the carina. Enteric catheter passes below diaphragm tip and side port projecting over gastric fundus. Cardiac silhouette is unremarkable. No airspace disease, effusion, or pneumothorax. There are no acute bony abnormalities. IMPRESSION: 1. Right mainstem intubation. Recommend retracting endotracheal tube approximately 4 cm to ensure adequate placement above carina. 2. No acute intrathoracic process. Critical Value/emergent results were called by telephone at the time of interpretation on 10/17/2020 at 6:09 pm to provider [facility] RAY , who verbally acknowledged these results. Electronically Signed   By: Sharlet Salina M.D.   On: 10/17/2020 18:09   DG Abd Portable 1V  Result Date: 10/18/2020 CLINICAL DATA:  Status post nasogastric tube placement EXAM: PORTABLE ABDOMEN - 1 VIEW COMPARISON:  None. FINDINGS: Nasogastric tube appears adequately positioned in the stomach with tip directed towards the stomach fundus. IMPRESSION: Nasogastric tube adequately positioned in the stomach. Electronically Signed   By: Bary Richard M.D.   On: 10/18/2020 12:36   MR MRV HEAD W WO CONTRAST  Result Date: 10/19/2020 CLINICAL DATA:  Cerebral hemorrhage suspected EXAM: MRI HEAD WITHOUT AND WITH CONTRAST MRA HEAD WITHOUT AND WITH CONTRAST TECHNIQUE: Multiplanar, multiecho pulse sequences of the brain and surrounding structures were obtained without and with intravenous contrast. Angiographic images of the head were obtained using MRV technique without and with contrast. CONTRAST:  7.20mL GADAVIST GADOBUTROL 1 MMOL/ML IV SOLN COMPARISON:  Head CT from yesterday. CTA of the head neck from 2 days ago FINDINGS: MRI HEAD FINDINGS Brain: Evacuated left parietal hematoma with expected remaining fluid and blood products. Pneumocephalus has  significantly decreased. Midline shift is resolved  although there is still signs of elevated intracranial pressure with diffuse effacement of CSF spaces. Intraventricular extension of blood clot. Left-sided drain which traverses the hematoma, tip near the left cerebral peduncle. The catheter does not directly communicate with the ventricular system which is nondilated. No masslike enhancement is seen adjacent to the hemorrhage. There is cortical restricted diffusion about the operative cavity. Accentuated vessels along the superficial aspect of the hematoma cavity, usually reactive. Vascular: As above.  Enhancing dural venous sinuses. Skull and upper cervical spine: Unremarkable craniotomy. Sinuses/Orbits: Negative MRV HEAD FINDINGS No dural venous sinus thrombosis is seen. Narrow right transverse and sigmoid sinuses attributed to developmental variation. No evidence of cortical vein thrombosis or shunting. IMPRESSION: Brain MRI: 1. No mass or definite vascular lesion underlying the evacuated left parietal hematoma. There is accentuated intravascular enhancement along the superficial cavity which may be reactive; catheter angiography would be contributory. 2. Resolved midline shift. No hydrocephalus. MRV: No evidence for venous thrombosis. Electronically Signed   By: Marnee SpringJonathon  Watts M.D.   On: 10/19/2020 05:50   ECHOCARDIOGRAM COMPLETE  Result Date: 10/20/2020    ECHOCARDIOGRAM REPORT   Patient Name:   Marcello MooresJAHAYLA M Torbert Date of Exam: 10/19/2020 Medical Rec #:  [id number]        Height:       68.0 in Accession #:    [id number]       Weight:       160.0 lb Date of Birth:  1996-06-17        BSA:          1.859 m Patient Age:    24 years         BP:           100/72 mmHg Patient Gender: F                HR:           71 bpm. Exam Location:  Inpatient Procedure: 2D Echo, Cardiac Doppler and Color Doppler Indications:    Stroke I63.9  History:        Patient has no prior history of Echocardiogram examinations.                  Covid 19 positive.  Sonographer:    Roosvelt Maserachel Lane RDCS Referring Phys: [id number] Barnett AbuHENRY ELSNER IMPRESSIONS  1. Left ventricular ejection fraction, by estimation, is 55 to 60%. The left ventricle has normal function. The left ventricle has no regional wall motion abnormalities. Left ventricular diastolic function could not be evaluated.  2. Right ventricular systolic function is normal. The right ventricular size is normal.  3. The mitral valve is normal in structure. No evidence of mitral valve regurgitation.  4. The aortic valve is normal in structure. Aortic valve regurgitation is not visualized. Comparison(s): No prior Echocardiogram. Conclusion(s)/Recommendation(s): Otherwise normal echocardiogram, with minor abnormalities described in the report. FINDINGS  Left Ventricle: Left ventricular ejection fraction, by estimation, is 55 to 60%. The left ventricle has normal function. The left ventricle has no regional wall motion abnormalities. The left ventricular internal cavity size was normal in size. There is  no left ventricular hypertrophy. Left ventricular diastolic function could not be evaluated. Right Ventricle: The right ventricular size is normal. No increase in right ventricular wall thickness. Right ventricular systolic function is normal. Left Atrium: Left atrial size was normal in size. Right Atrium: Right atrial size was normal in size. Pericardium: There is no evidence of pericardial effusion. Mitral Valve: The mitral valve  is normal in structure. No evidence of mitral valve regurgitation. Tricuspid Valve: The tricuspid valve is normal in structure. Tricuspid valve regurgitation is not demonstrated. Aortic Valve: The aortic valve is normal in structure. Aortic valve regurgitation is not visualized. Pulmonic Valve: The pulmonic valve was not assessed. Pulmonic valve regurgitation is not visualized. Aorta: The aortic root is normal in size and structure. IAS/Shunts: The atrial septum is grossly normal.  Riley Lam MD Electronically signed by Riley Lam MD Signature Date/Time: 10/20/2020/12:04:00 PM    Final    VAS [place] LOWER EXTREMITY VENOUS (DVT)  Result Date: 10/21/2020  Lower Venous DVT Study Indications: Stroke.  Risk Factors: ICH, Covid+. Comparison Study: No previous exams Performing Technologist: Ernestene Mention  Examination Guidelines: A complete evaluation includes B-mode imaging, spectral Doppler, color Doppler, and power Doppler as needed of all accessible portions of each vessel. Bilateral testing is considered an integral part of a complete examination. Limited examinations for reoccurring indications may be performed as noted. The reflux portion of the exam is performed with the patient in reverse Trendelenburg.  +---------+---------------+---------+-----------+----------+--------------+ RIGHT    CompressibilityPhasicitySpontaneityPropertiesThrombus Aging +---------+---------------+---------+-----------+----------+--------------+ CFV      Full           Yes      Yes                                 +---------+---------------+---------+-----------+----------+--------------+ SFJ      Full                                                        +---------+---------------+---------+-----------+----------+--------------+ FV Prox  Full           Yes      Yes                                 +---------+---------------+---------+-----------+----------+--------------+ FV Mid   Full           Yes      Yes                                 +---------+---------------+---------+-----------+----------+--------------+ FV DistalFull           Yes      Yes                                 +---------+---------------+---------+-----------+----------+--------------+ PFV      Full                                                        +---------+---------------+---------+-----------+----------+--------------+ POP      Full           Yes      Yes                                  +---------+---------------+---------+-----------+----------+--------------+ PTV  Full                                                        +---------+---------------+---------+-----------+----------+--------------+ PERO     Full                                                        +---------+---------------+---------+-----------+----------+--------------+   +---------+---------------+---------+-----------+----------+--------------+ LEFT     CompressibilityPhasicitySpontaneityPropertiesThrombus Aging +---------+---------------+---------+-----------+----------+--------------+ CFV      Full           Yes      Yes                                 +---------+---------------+---------+-----------+----------+--------------+ SFJ      Full                                                        +---------+---------------+---------+-----------+----------+--------------+ FV Prox  Full           Yes      Yes                                 +---------+---------------+---------+-----------+----------+--------------+ FV Mid   Full           Yes      Yes                                 +---------+---------------+---------+-----------+----------+--------------+ FV DistalFull           Yes      Yes                                 +---------+---------------+---------+-----------+----------+--------------+ PFV      Full                                                        +---------+---------------+---------+-----------+----------+--------------+ POP      Full           Yes      Yes                                 +---------+---------------+---------+-----------+----------+--------------+ PTV      Full                                                        +---------+---------------+---------+-----------+----------+--------------+ PERO     Full                                                         +---------+---------------+---------+-----------+----------+--------------+  Summary: BILATERAL: - No evidence of deep vein thrombosis seen in the lower extremities, bilaterally. - No evidence of superficial venous thrombosis in the lower extremities, bilaterally. -No evidence of popliteal cyst, bilaterally.   *See table(s) above for measurements and observations. Electronically signed by Gretta Began MD on 10/21/2020 at 5:10:53 PM.    Final    [place] EKG SITE RITE  Result Date: 10/19/2020 If Site Rite image not attached, placement could not be confirmed due to current cardiac rhythm.  CT ANGIO HEAD NECK W WO CM (CODE STROKE)  Result Date: 10/17/2020 CLINICAL DATA:  Patient unresponsive with abnormal posture. EXAM: CT HEAD WITHOUT CONTRAST CT ANGIOGRAPHY HEAD AND NECK TECHNIQUE: Multidetector CT imaging of the head and neck was performed using the standard protocol during bolus administration of intravenous contrast. Multiplanar CT image reconstructions and MIPs were obtained to evaluate the vascular anatomy. Carotid stenosis measurements (when applicable) are obtained utilizing NASCET criteria, using the distal internal carotid diameter as the denominator. CONTRAST:  75mL OMNIPAQUE IOHEXOL 350 MG/ML SOLN COMPARISON:  CT head/cervical spine performed earlier today 10/17/2020. FINDINGS: CTA NECK FINDINGS Aortic arch: Standard aortic branching. The visualized aortic arch is normal in caliber. No hemodynamically significant innominate or proximal subclavian artery stenosis. Right carotid system: CCA and ICA patent within the neck without stenosis. Left carotid system: CCA and ICA patent within the neck without stenosis. Vertebral arteries: The intracranial vertebral arteries are patent. Skeleton: No acute bony abnormality or aggressive osseous lesion. Other neck: No neck mass or cervical lymphadenopathy. Upper chest: No consolidation within the imaged lung apices. Partially imaged ET tube extending into the right  mainstem bronchus. Partially imaged enteric tube. Review of the MIP images confirms the above findings CTA HEAD FINDINGS Anterior circulation: The intracranial internal carotid arteries are patent. The M1 middle cerebral arteries are patent. No M2 proximal branch occlusion or high-grade proximal stenosis is identified. The anterior cerebral arteries are patent. No intracranial aneurysm is identified. No evidence of AVM. No contrast blush at the site of the patient's left cerebral hemisphere acute parenchymal hematoma. Posterior circulation: The intracranial vertebral arteries are patent. The basilar artery is patent. The posterior cerebral arteries are patent. Venous sinuses: The dural venous sinuses are poorly assessed due to contrast bolus timing. Anatomic variants: None significant Review of the MIP images confirms the above findings Findings of right mainstem bronchus intubation called by telephone at the time of interpretation on 10/17/2020 at 6:31 pm to provider [facility] RAY , who verbally acknowledged these results. IMPRESSION: CTA neck: 1. The common carotid, internal carotid and vertebral arteries are patent within the neck without stenosis. No evidence of dissection or aneurysm. 2. Right mainstem intubation. CTA head: 1. No intracranial aneurysm or AVM is identified. However, a contrast-enhanced brain MRI and MR angiography should be considered once the hematoma involute to further evaluate for any underlying lesion. 2. No contrast blush in the region of the left cerebral hemisphere acute parenchymal hematoma. 3. No intracranial large vessel occlusion or proximal high-grade arterial stenosis. Electronically Signed   By: Jackey Loge DO   On: 10/17/2020 18:39

## 2020-10-29 NOTE — Procedures (Signed)
S/P 4 vessel cerebral arteriogram Rt Rad approach. Findings. 1No angio evidence of aneurysm,DAVF,AV shunting ,AVM or of dissections. 2.Focal areas of irregularity of angular branch of Lt MCA inf division in the M4 region. ?spasm +/_ mass effect. 3.Venous outflow intact. S.Ellana Kawa MD .

## 2020-10-29 NOTE — Progress Notes (Signed)
OT Cancellation Note  Patient Details Name: Jane Martin MRN: [id number] DOB: 1995/08/19   Cancelled Treatment:    Reason Eval/Treat Not Completed: Active bedrest order. Pt on bed rest post procedure.  OT will hold and follow up as able.   Barry Brunner, OT Acute Rehabilitation Services Pager [phone] Office [phone]   Chancy Milroy 10/29/2020, 2:24 PM

## 2020-10-29 NOTE — Progress Notes (Signed)
Inpatient Rehab Admissions Coordinator:   Spoke to pt's mother and confirmed that she plans to take some time off of work once pt discharges from rehab to provide 24/7 support.  Sister is also available to help as needed.  Expect pt to have supervision level goals and estimated stay 2.5-3 weeks.  Mom is in agreement.  Insurance pending.  Will continue to follow for possible admit pending insurance approval and bed availability.   Estill Dooms, PT, DPT Admissions Coordinator [phone] 10/29/20  11:41 AM

## 2020-10-29 NOTE — Progress Notes (Signed)
[facility].  Patient underwent an image-guided diagnostic cerebral arteriogram this AM with Dr. Corliss Skains (please see his procedure note regarding findings).  Per Dr. Corliss Skains- recommend follow-up with CTA head/neck in approximately 6 weeks. [facility] schedulers to call patient to set up this imaging scan.  Further plans per [facility]/CCM/neurology/neurosurgery- appreciate and agree with management. Please call NIR with questions/concerns.   Waylan Boga Claude Swendsen, PA-C 10/29/2020, 10:19 AM

## 2020-10-29 NOTE — Sedation Documentation (Signed)
Right radial sheath removed, 12cc of air in TR band @ [id number]

## 2020-10-30 ENCOUNTER — Encounter (HOSPITAL_COMMUNITY): Payer: Self-pay | Admitting: Physical Medicine & Rehabilitation

## 2020-10-30 ENCOUNTER — Inpatient Hospital Stay (HOSPITAL_COMMUNITY)
Admission: RE | Admit: 2020-10-30 | Discharge: 2020-11-06 | DRG: 057 | Disposition: A | Discharge status: Home Health | Payer: 59 | Source: Intra-hospital | Attending: Physical Medicine and Rehabilitation | Admitting: Physical Medicine and Rehabilitation

## 2020-10-30 DIAGNOSIS — R1310 Dysphagia, unspecified: Secondary | ICD-10-CM | POA: Diagnosis present

## 2020-10-30 DIAGNOSIS — Z8249 Family history of ischemic heart disease and other diseases of the circulatory system: Secondary | ICD-10-CM | POA: Diagnosis not present

## 2020-10-30 DIAGNOSIS — I69291 Dysphagia following other nontraumatic intracranial hemorrhage: Secondary | ICD-10-CM

## 2020-10-30 DIAGNOSIS — I69251 Hemiplegia and hemiparesis following other nontraumatic intracranial hemorrhage affecting right dominant side: Principal | ICD-10-CM

## 2020-10-30 DIAGNOSIS — W19XXXD Unspecified fall, subsequent encounter: Secondary | ICD-10-CM | POA: Diagnosis not present

## 2020-10-30 DIAGNOSIS — D62 Acute posthemorrhagic anemia: Secondary | ICD-10-CM

## 2020-10-30 DIAGNOSIS — I69290 Apraxia following other nontraumatic intracranial hemorrhage: Secondary | ICD-10-CM | POA: Diagnosis not present

## 2020-10-30 DIAGNOSIS — Z79899 Other long term (current) drug therapy: Secondary | ICD-10-CM | POA: Diagnosis not present

## 2020-10-30 DIAGNOSIS — Z8616 Personal history of COVID-19: Secondary | ICD-10-CM

## 2020-10-30 DIAGNOSIS — R339 Retention of urine, unspecified: Secondary | ICD-10-CM | POA: Diagnosis present

## 2020-10-30 DIAGNOSIS — R4701 Aphasia: Secondary | ICD-10-CM | POA: Diagnosis not present

## 2020-10-30 DIAGNOSIS — R482 Apraxia: Secondary | ICD-10-CM | POA: Diagnosis present

## 2020-10-30 DIAGNOSIS — W19XXXA Unspecified fall, initial encounter: Secondary | ICD-10-CM | POA: Diagnosis not present

## 2020-10-30 DIAGNOSIS — R7989 Other specified abnormal findings of blood chemistry: Secondary | ICD-10-CM

## 2020-10-30 DIAGNOSIS — R000 Tachycardia, unspecified: Secondary | ICD-10-CM | POA: Diagnosis present

## 2020-10-30 DIAGNOSIS — R531 Weakness: Secondary | ICD-10-CM | POA: Diagnosis not present

## 2020-10-30 DIAGNOSIS — R945 Abnormal results of liver function studies: Secondary | ICD-10-CM

## 2020-10-30 DIAGNOSIS — I619 Nontraumatic intracerebral hemorrhage, unspecified: Secondary | ICD-10-CM | POA: Diagnosis present

## 2020-10-30 DIAGNOSIS — Z881 Allergy status to other antibiotic agents status: Secondary | ICD-10-CM

## 2020-10-30 DIAGNOSIS — I69220 Aphasia following other nontraumatic intracranial hemorrhage: Secondary | ICD-10-CM

## 2020-10-30 DIAGNOSIS — I69293 Ataxia following other nontraumatic intracranial hemorrhage: Secondary | ICD-10-CM

## 2020-10-30 DIAGNOSIS — I69390 Apraxia following cerebral infarction: Secondary | ICD-10-CM

## 2020-10-30 DIAGNOSIS — I612 Nontraumatic intracerebral hemorrhage in hemisphere, unspecified: Secondary | ICD-10-CM | POA: Diagnosis not present

## 2020-10-30 DIAGNOSIS — I611 Nontraumatic intracerebral hemorrhage in hemisphere, cortical: Secondary | ICD-10-CM | POA: Diagnosis not present

## 2020-10-30 LAB — COMPREHENSIVE METABOLIC PANEL
ALT: 97 U/L — ABNORMAL HIGH (ref 0–44)
AST: 41 U/L (ref 15–41)
Albumin: 3.4 g/dL — ABNORMAL LOW (ref 3.5–5.0)
Alkaline Phosphatase: 86 U/L (ref 38–126)
Anion gap: 9 (ref 5–15)
BUN: 11 mg/dL (ref 6–20)
CO2: 27 mmol/L (ref 22–32)
Calcium: 9.5 mg/dL (ref 8.9–10.3)
Chloride: 101 mmol/L (ref 98–111)
Creatinine, Ser: 0.69 mg/dL (ref 0.44–1.00)
GFR, Estimated: >60 mL/min (ref >60)
Glucose, Bld: 85 mg/dL (ref 70–99)
Potassium: 3.9 mmol/L (ref 3.5–5.1)
Sodium: 137 mmol/L (ref 135–145)
Total Bilirubin: 0.4 mg/dL (ref 0.3–1.2)
Total Protein: 6.9 g/dL (ref 6.5–8.1)

## 2020-10-30 LAB — CBC WITH DIFFERENTIAL/PLATELET
Abs Immature Granulocytes: 0.07 10*3/uL (ref 0.00–0.07)
Basophils Absolute: 0 10*3/uL (ref 0.0–0.1)
Basophils Relative: 1 %
Eosinophils Absolute: 0.1 10*3/uL (ref 0.0–0.5)
Eosinophils Relative: 2 %
HCT: 27.8 % — ABNORMAL LOW (ref 36.0–46.0)
Hemoglobin: 9.1 g/dL — ABNORMAL LOW (ref 12.0–15.0)
Immature Granulocytes: 1 %
Lymphocytes Relative: 30 %
Lymphs Abs: 2.6 10*3/uL (ref 0.7–4.0)
MCH: 29.8 pg (ref 26.0–34.0)
MCHC: 32.7 g/dL (ref 30.0–36.0)
MCV: 91.1 fL (ref 80.0–100.0)
Monocytes Absolute: 1 10*3/uL (ref 0.1–1.0)
Monocytes Relative: 11 %
Neutro Abs: 4.8 10*3/uL (ref 1.7–7.7)
Neutrophils Relative %: 55 %
Platelets: 399 10*3/uL (ref 150–400)
RBC: 3.05 MIL/uL — ABNORMAL LOW (ref 3.87–5.11)
RDW: 12.3 % (ref 11.5–15.5)
WBC: 8.6 10*3/uL (ref 4.0–10.5)
nRBC: 0 % (ref 0.0–0.2)

## 2020-10-30 LAB — MAGNESIUM: Magnesium: 2.1 mg/dL (ref 1.7–2.4)

## 2020-10-30 MED ORDER — BISACODYL 10 MG RE SUPP
10.0000 mg | Freq: Every day | RECTAL | Status: DC | PRN
Start: 2020-10-30 — End: 2020-11-06

## 2020-10-30 MED ORDER — FLEET ENEMA 7-19 GM/118ML RE ENEM: 1.0000 | ENEMA | Freq: Once | RECTAL | Status: DC | PRN

## 2020-10-30 MED ORDER — BLOOD PRESSURE CONTROL BOOK
Freq: Once | Status: AC
  Filled 2020-10-30: qty 1

## 2020-10-30 MED ORDER — ARTIFICIAL TEARS OPHTHALMIC OINT
1.0000 | TOPICAL_OINTMENT | OPHTHALMIC | Status: DC | PRN
Start: 2020-10-30 — End: 2023-11-04

## 2020-10-30 MED ORDER — ALUM & MAG HYDROXIDE-SIMETH 200-200-20 MG/5ML PO SUSP: 30.0000 mL | ORAL | Status: DC | PRN

## 2020-10-30 MED ORDER — POLYETHYLENE GLYCOL 3350 17 G PO PACK: 17.0000 g | PACK | Freq: Every day | ORAL | Status: DC | PRN

## 2020-10-30 MED ORDER — TAMSULOSIN HCL 0.4 MG PO CAPS
0.4000 mg | ORAL_CAPSULE | Freq: Every day | ORAL | Status: DC
  Administered 2020-10-31 – 2020-11-04 (×5): 0.4 mg via ORAL
  Filled 2020-10-30 (×5): qty 1

## 2020-10-30 MED ORDER — ENSURE ENLIVE PO LIQD
237.0000 mL | Freq: Two times a day (BID) | ORAL | Status: DC
  Administered 2020-10-30 – 2020-11-02 (×5): 237 mL via ORAL

## 2020-10-30 MED ORDER — ARTIFICIAL TEARS OPHTHALMIC OINT
1.0000 "application " | TOPICAL_OINTMENT | OPHTHALMIC | Status: DC | PRN
  Filled 2020-10-30: qty 3.5

## 2020-10-30 MED ORDER — PROCHLORPERAZINE 25 MG RE SUPP: 12.5000 mg | Freq: Four times a day (QID) | RECTAL | Status: DC | PRN

## 2020-10-30 MED ORDER — CHLORHEXIDINE GLUCONATE 0.12 % MT SOLN
15.0000 mL | Freq: Two times a day (BID) | OROMUCOSAL | Status: DC
  Administered 2020-10-30 – 2020-11-05 (×13): 15 mL via OROMUCOSAL
  Filled 2020-10-30 (×14): qty 15

## 2020-10-30 MED ORDER — LIDOCAINE HCL URETHRAL/MUCOSAL 2 % EX GEL
CUTANEOUS | Status: DC | PRN
  Filled 2020-10-30: qty 11

## 2020-10-30 MED ORDER — LEVETIRACETAM 500 MG PO TABS
500.0000 mg | ORAL_TABLET | Freq: Two times a day (BID) | ORAL | Status: DC
  Administered 2020-10-30 – 2020-11-04 (×10): 500 mg via ORAL
  Filled 2020-10-30 (×10): qty 1

## 2020-10-30 MED ORDER — DOCUSATE SODIUM 100 MG PO CAPS: 100.0000 mg | ORAL_CAPSULE | Freq: Two times a day (BID) | ORAL | 0 refills | Status: DC | PRN

## 2020-10-30 MED ORDER — PROCHLORPERAZINE MALEATE 5 MG PO TABS
5.0000 mg | ORAL_TABLET | Freq: Four times a day (QID) | ORAL | Status: DC | PRN
Start: 2020-10-30 — End: 2020-11-06

## 2020-10-30 MED ORDER — LEVETIRACETAM 500 MG PO TABS: 500.0000 mg | ORAL_TABLET | Freq: Two times a day (BID) | ORAL | Status: DC

## 2020-10-30 MED ORDER — ACETAMINOPHEN 325 MG PO TABS: 650.0000 mg | ORAL_TABLET | Freq: Four times a day (QID) | ORAL | Status: DC | PRN

## 2020-10-30 MED ORDER — TAMSULOSIN HCL 0.4 MG PO CAPS: 0.4000 mg | ORAL_CAPSULE | Freq: Every day | ORAL | Status: DC

## 2020-10-30 MED ORDER — BETHANECHOL CHLORIDE 10 MG PO TABS
10.0000 mg | ORAL_TABLET | Freq: Three times a day (TID) | ORAL | Status: DC
  Administered 2020-10-30 – 2020-11-03 (×12): 10 mg via ORAL
  Filled 2020-10-30 (×12): qty 1

## 2020-10-30 MED ORDER — ENOXAPARIN SODIUM 40 MG/0.4ML IJ SOSY
40.0000 mg | PREFILLED_SYRINGE | INTRAMUSCULAR | Status: DC
  Administered 2020-10-30 – 2020-11-03 (×5): 40 mg via SUBCUTANEOUS
  Filled 2020-10-30 (×5): qty 0.4

## 2020-10-30 MED ORDER — DIPHENHYDRAMINE HCL 12.5 MG/5ML PO ELIX: 12.5000 mg | ORAL_SOLUTION | Freq: Four times a day (QID) | ORAL | Status: DC | PRN

## 2020-10-30 MED ORDER — DOCUSATE SODIUM 100 MG PO CAPS
100.0000 mg | ORAL_CAPSULE | Freq: Two times a day (BID) | ORAL | Status: DC
  Administered 2020-10-30 – 2020-11-05 (×12): 100 mg via ORAL
  Filled 2020-10-30 (×14): qty 1

## 2020-10-30 MED ORDER — SENNA 8.6 MG PO TABS: 1.0000 | ORAL_TABLET | Freq: Every evening | ORAL | Status: DC | PRN

## 2020-10-30 MED ORDER — GUAIFENESIN-DM 100-10 MG/5ML PO SYRP
5.0000 mL | ORAL_SOLUTION | Freq: Four times a day (QID) | ORAL | Status: DC | PRN
  Filled 2020-10-30: qty 10

## 2020-10-30 MED ORDER — ACETAMINOPHEN 325 MG PO TABS
325.0000 mg | ORAL_TABLET | ORAL | Status: DC | PRN
  Administered 2020-11-05: 650 mg via ORAL
  Filled 2020-10-30: qty 2

## 2020-10-30 MED ORDER — PROCHLORPERAZINE EDISYLATE 10 MG/2ML IJ SOLN: 5.0000 mg | Freq: Four times a day (QID) | INTRAMUSCULAR | Status: DC | PRN

## 2020-10-30 MED ORDER — TRAZODONE HCL 50 MG PO TABS
25.0000 mg | ORAL_TABLET | Freq: Every evening | ORAL | Status: DC | PRN
  Administered 2020-10-31 – 2020-11-02 (×3): 50 mg via ORAL
  Filled 2020-10-30 (×3): qty 1

## 2020-10-30 NOTE — Plan of Care (Signed)
  Problem: Education: Goal: Knowledge of secondary prevention will improve Outcome: Progressing Goal: Knowledge of patient specific risk factors addressed and post discharge goals established will improve Outcome: Progressing   Problem: Coping: Goal: Will verbalize positive feelings about self Outcome: Progressing Goal: Will identify appropriate support needs Outcome: Progressing   Problem: Health Behavior/Discharge Planning: Goal: Ability to manage health-related needs will improve Outcome: Progressing   Problem: Self-Care: Goal: Ability to participate in self-care as condition permits will improve Outcome: Progressing Goal: Verbalization of feelings and concerns over difficulty with self-care will improve Outcome: Progressing Goal: Ability to communicate needs accurately will improve Outcome: Progressing   Problem: Nutrition: Goal: Risk of aspiration will decrease Outcome: Progressing Goal: Dietary intake will improve Outcome: Progressing   Problem: Intracerebral Hemorrhage Tissue Perfusion: Goal: Complications of Intracerebral Hemorrhage will be minimized Outcome: Progressing   Problem: Safety: Goal: Non-violent Restraint(s) Outcome: Progressing

## 2020-10-30 NOTE — H&P (Signed)
Physical Medicine and Rehabilitation Admission H&P    Chief Complaint  Patient presents with  . Functional deficits due to ICH    HPI: Jane Martin is a 25 year old female who was admitted 10/17/20 after collapsing at a gas station and was unresponsive with fixed dysconjugate gaze, right hemiplegia and agonal breathing. 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 CTA negative for AVM, aneurysm or high grade stenosis.  UDS positive for THC. She was taken to OR emergently for left parietal crani for evacuation of hemorrhage and placement of IVC by Dr. Danielle Dess. Hypertonic saline used to manage edema and serial CT head showed  Pathology showed benign brain parenchyma. 2D echo showed EF 55-60% and no wall abnormality. BLE dopplers were negative for DVT. She was found to be Covid + and placed on isolation.   MRV done for work up and was negative for mass or vascular lesion or venous thrombosis. MRI brain showed evacuated left parietal hematoma with resolution of midline shift, accentuated intravascular enhancement along superficial cavity--question reactive and no hydrocephalus. She was extubated without difficulty on 04/18 and IVC removed 04/19.  CT angiogram done for further work up on 04/27 and was negative for  Aneurysm, shunt, AVM, dissection, DAVF and focal irregularity of angular branch of L-MCA M4 region questioned due to mass effect or spasm. Dr.Deveshwar recommends repeat angiogram in 6 weeks (IR scheduler to call pt).  Neurology feels that bleed due to unknown etiology and recommends SBP goal <160. Asymptomatic bradycardia has resolved, foley placed due to urinary retention and cortak replaced briefly due to poor intake.  Dysphagia has resolved and she  is tolerating regular diet.   Patient with resultant bouts  of lethargy, expressive>receptive aphasia, RUE>RLE weakness and right facial droop. Therapy ongoing and CIR recommended due to functional decline.    Pt denies pain Was having urinary retention, but hasn't required in/out cathing lately.  Cortrak has been removed- eating well enough.  LBM yesterday- no constipation   Review of Systems  Constitutional: Negative for chills and fever.  HENT: Negative for ear discharge and hearing loss.   Eyes: Negative for blurred vision.  Respiratory: Negative for cough and shortness of breath.   Cardiovascular: Negative for chest pain and claudication.  Gastrointestinal: Negative for abdominal pain, constipation and heartburn.  Genitourinary: Negative for dysuria and hematuria.  Musculoskeletal: Negative for joint pain and myalgias.  Skin: Negative for rash.  Neurological: Positive for speech change and weakness. Negative for dizziness and headaches.  Psychiatric/Behavioral: The patient is not nervous/anxious.   All other systems reviewed and are negative.     History reviewed. No pertinent past medical history.    Past Surgical History:  Procedure Laterality Date  . CRANIOTOMY Left 10/17/2020   Procedure: CRANIOTOMY HEMATOMA Evacuation;  Surgeon: Barnett Abu, MD;  Location: [facility] OR;  Service: Neurosurgery;  Laterality: Left;  . IR ANGIO INTRA EXTRACRAN SEL COM CAROTID INNOMINATE BILAT MOD SED  10/29/2020  . IR ANGIO VERTEBRAL SEL VERTEBRAL BILAT MOD SED  10/29/2020  . IR US GUIDE VASC ACCESS RIGHT  10/29/2020    Family History  Problem Relation Age of Onset  . Hypertension Mother   . Intracerebral hemorrhage Mother        due to HTN  . Healthy Father     Social  History:  reports that she has never smoked. She has never used smokeless tobacco. She reports current alcohol use. She reports current drug use. Drug: Marijuana.   Allergies  Allergen Reactions  . Flagyl [Metronidazole]     Medications Prior to Admission  Medication Sig  Dispense Refill  . acetaminophen (TYLENOL) 325 MG tablet Take 2 tablets (650 mg total) by mouth every 6 (six) hours as needed for mild pain (or temp > 37.5 C (99.5 F)).    Marland Kitchen. artificial tears (LACRILUBE) OINT ophthalmic ointment Place 1 application into both eyes as needed (apply to protect eyes from lash extensions).    . docusate sodium (COLACE) 100 MG capsule Take 1 capsule (100 mg total) by mouth 2 (two) times daily as needed for mild constipation. 10 capsule 0  . fluticasone (FLONASE) 50 MCG/ACT nasal spray Place 2 sprays into both nostrils daily. 9.9 mL 2  . levETIRAcetam (KEPPRA) 500 MG tablet Take 1 tablet (500 mg total) by mouth 2 (two) times daily.    Melene Muller. [START ON 10/31/2020] tamsulosin (FLOMAX) 0.4 MG CAPS capsule Take 1 capsule (0.4 mg total) by mouth daily. 30 capsule     Drug Regimen Review  Drug regimen was reviewed and remains appropriate with no significant issues identified  Home: Home Living Family/patient expects to be discharged to:: Private residence   Functional History:    Functional Status:  Mobility:          ADL:    Cognition: Cognition Orientation Level: Oriented to person,Oriented to place,Disoriented to time,Disoriented to situation     Blood pressure 111/71, pulse 90, temperature 98.4 F (36.9 C), temperature source Oral, resp. rate 16, SpO2 100 %. Physical Exam Vitals and nursing note reviewed. Exam conducted with a chaperone present.  Constitutional:      Appearance: Normal appearance.     Comments: Sitting up in bed- long artificial lashes- s/p crani L frontal, NAD  HENT:     Head:     Comments: Smile equal- tongue midline    Right Ear: External ear normal.     Left Ear: External ear normal.     Nose: Nose normal. No congestion.     Mouth/Throat:     Mouth: Mucous membranes are dry.     Pharynx: Oropharynx is clear. No oropharyngeal exudate.  Eyes:     General:        Right eye: No discharge.        Left eye: No discharge.      Extraocular Movements: Extraocular movements intact.     Conjunctiva/sclera: Conjunctivae normal.  Cardiovascular:     Rate and Rhythm: Regular rhythm. Tachycardia present.     Heart sounds: Normal heart sounds. No murmur heard. No gallop.   Pulmonary:     Comments: CTA B/L- no W/R/R- good air movement Abdominal:     Comments: Soft, NT, ND, (+)BS   Musculoskeletal:     Cervical back: Normal range of motion and neck supple.     Comments: RUE- proximal 3+/5, WE 3+/5, grip and finger abd 2/5 LUE 5-/5 in same muscles RLE- HF 4/5, KE 4/5, DF/PF - atl east 2/5- apraxia complicated exam LLE- 5/5 in same muscles  Skin:    General: Skin is warm and dry.     Comments: Very long artificial nails- painted No skin breakdown on backside or heels B/L  Neurological:     Mental Status: She is alert.     Comments: Expressive/receptive aphasia. Unable to state month with  choice of two, unable to follow 2 step commands or participate in MMT.   Expressive aphasia noted Severe apraxia noted, which made Strength exam very difficult Very decreased sensation to light touch on R side- RUE/RLE   Psychiatric:     Comments: Slightly anxious, sad     Results for orders placed or performed during the hospital encounter of 10/17/20 (from the past 48 hour(s))  Magnesium     Status: None   Collection Time: 10/29/20  5:00 AM  Result Value Ref Range   Magnesium 2.2 1.7 - 2.4 mg/dL    Comment: Performed at [facility], [address], [place], [place] [address]  CBC with Differential/Platelet     Status: Abnormal   Collection Time: 10/29/20  5:00 AM  Result Value Ref Range   WBC 8.7 4.0 - 10.5 K/uL   RBC 3.08 (L) 3.87 - 5.11 MIL/uL   Hemoglobin 9.4 (L) 12.0 - 15.0 g/dL   HCT 60.4 (L) 54.0 - 98.1 %   MCV 90.9 80.0 - 100.0 fL   MCH 30.5 26.0 - 34.0 pg   MCHC 33.6 30.0 - 36.0 g/dL   RDW 19.1 47.8 - 29.5 %   Platelets 400 150 - 400 K/uL   nRBC 0.0 0.0 - 0.2 %   Neutrophils Relative % 57 %   Neutro  Abs 5.0 1.7 - 7.7 K/uL   Lymphocytes Relative 29 %   Lymphs Abs 2.5 0.7 - 4.0 K/uL   Monocytes Relative 11 %   Monocytes Absolute 1.0 0.1 - 1.0 K/uL   Eosinophils Relative 1 %   Eosinophils Absolute 0.1 0.0 - 0.5 K/uL   Basophils Relative 1 %   Basophils Absolute 0.1 0.0 - 0.1 K/uL   Immature Granulocytes 1 %   Abs Immature Granulocytes 0.06 0.00 - 0.07 K/uL    Comment: Performed at [facility], [address], [place], [place] [address]  Comprehensive metabolic panel     Status: Abnormal   Collection Time: 10/29/20  5:00 AM  Result Value Ref Range   Sodium 136 135 - 145 mmol/L   Potassium 3.9 3.5 - 5.1 mmol/L   Chloride 96 (L) 98 - 111 mmol/L   CO2 30 22 - 32 mmol/L   Glucose, Bld 94 70 - 99 mg/dL    Comment: Glucose reference range applies only to samples taken after fasting for at least 8 hours.   BUN 13 6 - 20 mg/dL   Creatinine, Ser 8.65 0.44 - 1.00 mg/dL   Calcium 78.4 8.9 - 69.6 mg/dL   Total Protein 7.3 6.5 - 8.1 g/dL   Albumin 3.6 3.5 - 5.0 g/dL   AST 47 (H) 15 - 41 U/L   ALT 113 (H) 0 - 44 U/L   Alkaline Phosphatase 91 38 - 126 U/L   Total Bilirubin 0.4 0.3 - 1.2 mg/dL   GFR, Estimated >29 >52 mL/min    Comment: (NOTE) Calculated using the CKD-EPI Creatinine Equation (2021)    Anion gap 10 5 - 15    Comment: Performed at [facility], [address] [place], [place] [address]  Magnesium     Status: None   Collection Time: 10/30/20  3:55 AM  Result Value Ref Range   Magnesium 2.1 1.7 - 2.4 mg/dL    Comment: Performed at [facility], [address], [place], [place] [address]  CBC with Differential/Platelet     Status: Abnormal   Collection Time: 10/30/20  3:55 AM  Result Value Ref Range   WBC 8.6 4.0 - 10.5 K/uL   RBC 3.05 (L) 3.87 - 5.11 MIL/uL   Hemoglobin 9.1 (L) 12.0 - 15.0 g/dL   HCT 40.9 (L) 81.1 - 91.4 %   MCV 91.1 80.0 - 100.0 fL   MCH 29.8 26.0 - 34.0 pg   MCHC 32.7 30.0 - 36.0 g/dL   RDW 78.2 95.6 - 21.3 %   Platelets 399 150  - 400 K/uL   nRBC 0.0 0.0 - 0.2 %   Neutrophils Relative % 55 %   Neutro Abs 4.8 1.7 - 7.7 K/uL   Lymphocytes Relative 30 %   Lymphs Abs 2.6 0.7 - 4.0 K/uL   Monocytes Relative 11 %   Monocytes Absolute 1.0 0.1 - 1.0 K/uL   Eosinophils Relative 2 %   Eosinophils Absolute 0.1 0.0 - 0.5 K/uL   Basophils Relative 1 %   Basophils Absolute 0.0 0.0 - 0.1 K/uL   Immature Granulocytes 1 %   Abs Immature Granulocytes 0.07 0.00 - 0.07 K/uL    Comment: Performed at [facility], [address] [place], [place] [address]  Comprehensive metabolic panel     Status: Abnormal   Collection Time: 10/30/20  3:55 AM  Result Value Ref Range   Sodium 137 135 - 145 mmol/L   Potassium 3.9 3.5 - 5.1 mmol/L   Chloride 101 98 - 111 mmol/L   CO2 27 22 - 32 mmol/L   Glucose, Bld 85 70 - 99 mg/dL    Comment: Glucose reference range applies only to samples taken after fasting for at least 8 hours.   BUN 11 6 - 20 mg/dL   Creatinine, Ser 8.46 0.44 - 1.00 mg/dL   Calcium 9.5 8.9 - 96.2 mg/dL   Total Protein 6.9 6.5 - 8.1 g/dL   Albumin 3.4 (L) 3.5 - 5.0 g/dL   AST 41 15 - 41 U/L   ALT 97 (H) 0 - 44 U/L   Alkaline Phosphatase 86 38 - 126 U/L   Total Bilirubin 0.4 0.3 - 1.2 mg/dL   GFR, Estimated >95 >28 mL/min    Comment: (NOTE) Calculated using the CKD-EPI Creatinine Equation (2021)    Anion gap 9 5 - 15    Comment: Performed at [facility], [address] [place], [place] [address]   IR US Guide Vasc Access Right  Result Date: 10/30/2020 CLINICAL DATA:  Left cerebral hemispheric hemorrhage with ventricular extension. EXAM: BILATERAL COMMON CAROTID AND INNOMINATE ANGIOGRAPHY COMPARISON:  Recent CT angiogram of the head and neck, and MRI of the brain. MEDICATIONS: Heparin 2000 units IA. No antibiotic was administered within 1 hour of the procedure. ANESTHESIA/SEDATION: Versed 1 mg IV; Fentanyl 25 mcg IV Moderate Sedation Time:  51 minutes The patient was continuously monitored during the  procedure by the interventional radiology nurse under my direct supervision. CONTRAST:  Isovue 300 approximately 75 mL. FLUOROSCOPY TIME:  Fluoroscopy Time: 19 minutes 30 seconds (1498 mGy). COMPLICATIONS: None immediate. TECHNIQUE: Informed written consent was obtained from the patient after a thorough discussion of the procedural risks, benefits and alternatives. All questions were addressed. Maximal Sterile Barrier Technique was utilized including caps, mask, sterile gowns, sterile gloves, sterile drape, hand hygiene and skin antiseptic. A timeout was performed prior to the initiation of the procedure. The right forearm to the wrist was prepped and draped in the usual sterile manner. The right radial artery was identified with ultrasound, and its morphology  documented. A dorsal palmar anastomosis was verified to be present. Using ultrasound guidance radial access was obtained over a 0.018 inch micro guidewire. A 4/5 French radial sheath was then inserted without event. The micro guidewire, and the obturator were removed. Good aspiration was obtained from the radial sheath. A cocktail of 2000 units of heparin, 2.5 mg of verapamil, and 200 mcg of nitroglycerin was then infused in diluted form without event. A right radial arteriogram was performed. Over a 0.035 inch Roadrunner guidewire, a 5 [place] Simmons 2 diagnostic catheter was advanced to the aortic arch region, and cannulations were performed of the right vertebral artery, the right common carotid artery, the left common carotid artery and the left vertebral artery just proximal to its origin. A wrist band was applied at the radial puncture site for hemostasis. Distal right radial pulse was verified to be present. FINDINGS:.: FINDINGS:. The right vertebral origin is widely patent. The vessel is seen to opacify to the cranial skull base. Patency is seen of the right vertebrobasilar junction and the right posterior-inferior cerebellar artery. The basilar  artery, the posterior cerebral arteries, the superior cerebellar arteries and the anterior-inferior cerebellar arteries opacify into the capillary and venous phases. Transient opacification of both posterior communicating arteries was noted. The left common carotid arteriogram demonstrates the left external carotid artery and its major branches to be widely patent. The left internal carotid artery at the bulb to the cranial skull base is widely patent. Petrous and proximal cavernous segments are widely patent. There is mild diffuse narrowing of the distal cavernous, and supraclinoid segments of the left internal carotid artery. The left middle cerebral artery and the left anterior cerebral artery opacify into the capillary and venous phases. The distal M4 branches of the angular branch of the inferior division demonstrates focal areas of mild-to-moderate irregularity with intermittent stenosis. No evidence of stagnation, however, is noted. No evidence of arteriovenous shunting, dural AV fistula, arteriovenous malformation, or aneurysm is seen in the immediate vicinity of the intra cerebral hemorrhage. Venous outflow demonstrates normal antegrade flow into cortical veins and dural venous sinuses into the internal jugular veins bilaterally. The right common carotid arteriogram demonstrates the right external carotid artery and its major branches to be widely patent. The right internal carotid artery at the bulb to the cranial skull base is widely patent. There is mild tapered narrowing of the distal cavernous right ICA extending into the supraclinoid right ICA. The right middle and the right anterior cerebral artery opacify into the capillary and venous phases. The left vertebral artery origin is widely patent. The vessel opacifies normally to the cranial skull base. Patency is maintained of the left vertebrobasilar junction and the left posterior-inferior cerebellar artery. The basilar artery, the posterior cerebral  arteries, the superior cerebellar artery and the anterior-inferior cerebellar arteries opacify into the capillary and venous phases. IMPRESSION: Angiographically no evidence of arteriovenous shunting to suggest arteriovenous malformation or dural AV fistula. No evidence of intracranial aneurysms, or of dissections. Moderate irregularity with stenosis of the M4 branches of the angular branch of the left middle cerebral artery inferior division and/or mass effect related to the intracranial hemorrhage. Mild tapered narrowing of both internal carotid arteries supraclinoid segments may represent vasospasm. PLAN: Findings discussed with patient and the referring MD. Follow-up CT angiogram of the head and neck in approximately 6 weeks time. Electronically Signed   By: Julieanne Cotton M.D.   On: 10/29/2020 11:49   IR ANGIO INTRA EXTRACRAN SEL COM CAROTID INNOMINATE BILAT MOD  SED  Result Date: 10/30/2020 CLINICAL DATA:  Left cerebral hemispheric hemorrhage with ventricular extension. EXAM: BILATERAL COMMON CAROTID AND INNOMINATE ANGIOGRAPHY COMPARISON:  Recent CT angiogram of the head and neck, and MRI of the brain. MEDICATIONS: Heparin 2000 units IA. No antibiotic was administered within 1 hour of the procedure. ANESTHESIA/SEDATION: Versed 1 mg IV; Fentanyl 25 mcg IV Moderate Sedation Time:  51 minutes The patient was continuously monitored during the procedure by the interventional radiology nurse under my direct supervision. CONTRAST:  Isovue 300 approximately 75 mL. FLUOROSCOPY TIME:  Fluoroscopy Time: 19 minutes 30 seconds (1498 mGy). COMPLICATIONS: None immediate. TECHNIQUE: Informed written consent was obtained from the patient after a thorough discussion of the procedural risks, benefits and alternatives. All questions were addressed. Maximal Sterile Barrier Technique was utilized including caps, mask, sterile gowns, sterile gloves, sterile drape, hand hygiene and skin antiseptic. A timeout was performed  prior to the initiation of the procedure. The right forearm to the wrist was prepped and draped in the usual sterile manner. The right radial artery was identified with ultrasound, and its morphology documented. A dorsal palmar anastomosis was verified to be present. Using ultrasound guidance radial access was obtained over a 0.018 inch micro guidewire. A 4/5 French radial sheath was then inserted without event. The micro guidewire, and the obturator were removed. Good aspiration was obtained from the radial sheath. A cocktail of 2000 units of heparin, 2.5 mg of verapamil, and 200 mcg of nitroglycerin was then infused in diluted form without event. A right radial arteriogram was performed. Over a 0.035 inch Roadrunner guidewire, a 5 [place] Simmons 2 diagnostic catheter was advanced to the aortic arch region, and cannulations were performed of the right vertebral artery, the right common carotid artery, the left common carotid artery and the left vertebral artery just proximal to its origin. A wrist band was applied at the radial puncture site for hemostasis. Distal right radial pulse was verified to be present. FINDINGS:.: FINDINGS:. The right vertebral origin is widely patent. The vessel is seen to opacify to the cranial skull base. Patency is seen of the right vertebrobasilar junction and the right posterior-inferior cerebellar artery. The basilar artery, the posterior cerebral arteries, the superior cerebellar arteries and the anterior-inferior cerebellar arteries opacify into the capillary and venous phases. Transient opacification of both posterior communicating arteries was noted. The left common carotid arteriogram demonstrates the left external carotid artery and its major branches to be widely patent. The left internal carotid artery at the bulb to the cranial skull base is widely patent. Petrous and proximal cavernous segments are widely patent. There is mild diffuse narrowing of the distal cavernous, and  supraclinoid segments of the left internal carotid artery. The left middle cerebral artery and the left anterior cerebral artery opacify into the capillary and venous phases. The distal M4 branches of the angular branch of the inferior division demonstrates focal areas of mild-to-moderate irregularity with intermittent stenosis. No evidence of stagnation, however, is noted. No evidence of arteriovenous shunting, dural AV fistula, arteriovenous malformation, or aneurysm is seen in the immediate vicinity of the intra cerebral hemorrhage. Venous outflow demonstrates normal antegrade flow into cortical veins and dural venous sinuses into the internal jugular veins bilaterally. The right common carotid arteriogram demonstrates the right external carotid artery and its major branches to be widely patent. The right internal carotid artery at the bulb to the cranial skull base is widely patent. There is mild tapered narrowing of the distal cavernous right ICA extending into  the supraclinoid right ICA. The right middle and the right anterior cerebral artery opacify into the capillary and venous phases. The left vertebral artery origin is widely patent. The vessel opacifies normally to the cranial skull base. Patency is maintained of the left vertebrobasilar junction and the left posterior-inferior cerebellar artery. The basilar artery, the posterior cerebral arteries, the superior cerebellar artery and the anterior-inferior cerebellar arteries opacify into the capillary and venous phases. IMPRESSION: Angiographically no evidence of arteriovenous shunting to suggest arteriovenous malformation or dural AV fistula. No evidence of intracranial aneurysms, or of dissections. Moderate irregularity with stenosis of the M4 branches of the angular branch of the left middle cerebral artery inferior division and/or mass effect related to the intracranial hemorrhage. Mild tapered narrowing of both internal carotid arteries supraclinoid  segments may represent vasospasm. PLAN: Findings discussed with patient and the referring MD. Follow-up CT angiogram of the head and neck in approximately 6 weeks time. Electronically Signed   By: Julieanne Cotton M.D.   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 EXAM: BILATERAL COMMON CAROTID AND INNOMINATE ANGIOGRAPHY COMPARISON:  Recent CT angiogram of the head and neck, and MRI of the brain. MEDICATIONS: Heparin 2000 units IA. No antibiotic was administered within 1 hour of the procedure. ANESTHESIA/SEDATION: Versed 1 mg IV; Fentanyl 25 mcg IV Moderate Sedation Time:  51 minutes The patient was continuously monitored during the procedure by the interventional radiology nurse under my direct supervision. CONTRAST:  Isovue 300 approximately 75 mL. FLUOROSCOPY TIME:  Fluoroscopy Time: 19 minutes 30 seconds (1498 mGy). COMPLICATIONS: None immediate. TECHNIQUE: Informed written consent was obtained from the patient after a thorough discussion of the procedural risks, benefits and alternatives. All questions were addressed. Maximal Sterile Barrier Technique was utilized including caps, mask, sterile gowns, sterile gloves, sterile drape, hand hygiene and skin antiseptic. A timeout was performed prior to the initiation of the procedure. The right forearm to the wrist was prepped and draped in the usual sterile manner. The right radial artery was identified with ultrasound, and its morphology documented. A dorsal palmar anastomosis was verified to be present. Using ultrasound guidance radial access was obtained over a 0.018 inch micro guidewire. A 4/5 French radial sheath was then inserted without event. The micro guidewire, and the obturator were removed. Good aspiration was obtained from the radial sheath. A cocktail of 2000 units of heparin, 2.5 mg of verapamil, and 200 mcg of  nitroglycerin was then infused in diluted form without event. A right radial arteriogram was performed. Over a 0.035 inch Roadrunner guidewire, a 5 [place] Simmons 2 diagnostic catheter was advanced to the aortic arch region, and cannulations were performed of the right vertebral artery, the right common carotid artery, the left common carotid artery and the left vertebral artery just proximal to its origin. A wrist band was applied at the radial puncture site for hemostasis. Distal right radial pulse was verified to be present. FINDINGS:.: FINDINGS:. The right vertebral origin is widely patent. The vessel is seen to opacify to the cranial skull base. Patency is seen of the right vertebrobasilar junction and the right posterior-inferior cerebellar artery. The basilar artery, the posterior cerebral arteries, the superior cerebellar arteries and the anterior-inferior cerebellar arteries opacify into the capillary and venous phases. Transient opacification of both posterior communicating arteries was noted. The left common carotid arteriogram demonstrates the left external carotid artery and its major  branches to be widely patent. The left internal carotid artery at the bulb to the cranial skull base is widely patent. Petrous and proximal cavernous segments are widely patent. There is mild diffuse narrowing of the distal cavernous, and supraclinoid segments of the left internal carotid artery. The left middle cerebral artery and the left anterior cerebral artery opacify into the capillary and venous phases. The distal M4 branches of the angular branch of the inferior division demonstrates focal areas of mild-to-moderate irregularity with intermittent stenosis. No evidence of stagnation, however, is noted. No evidence of arteriovenous shunting, dural AV fistula, arteriovenous malformation, or aneurysm is seen in the immediate vicinity of the intra cerebral hemorrhage. Venous outflow demonstrates normal antegrade flow  into cortical veins and dural venous sinuses into the internal jugular veins bilaterally. The right common carotid arteriogram demonstrates the right external carotid artery and its major branches to be widely patent. The right internal carotid artery at the bulb to the cranial skull base is widely patent. There is mild tapered narrowing of the distal cavernous right ICA extending into the supraclinoid right ICA. The right middle and the right anterior cerebral artery opacify into the capillary and venous phases. The left vertebral artery origin is widely patent. The vessel opacifies normally to the cranial skull base. Patency is maintained of the left vertebrobasilar junction and the left posterior-inferior cerebellar artery. The basilar artery, the posterior cerebral arteries, the superior cerebellar artery and the anterior-inferior cerebellar arteries opacify into the capillary and venous phases. IMPRESSION: Angiographically no evidence of arteriovenous shunting to suggest arteriovenous malformation or dural AV fistula. No evidence of intracranial aneurysms, or of dissections. Moderate irregularity with stenosis of the M4 branches of the angular branch of the left middle cerebral artery inferior division and/or mass effect related to the intracranial hemorrhage. Mild tapered narrowing of both internal carotid arteries supraclinoid segments may represent vasospasm. PLAN: Findings discussed with patient and the referring MD. Follow-up CT angiogram of the head and neck in approximately 6 weeks time. Electronically Signed   By: Julieanne Cotton M.D.   On: 10/29/2020 11:49       Medical Problem List and Plan: 1.  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  Antithrombotics: -DVT/anticoagulation:  Pharmaceutical: Lovenox  -antiplatelet therapy: N/a 3. Pain Management: Tylenol prn.  4. Mood: LCSW to follow for  evaluation and support.   -antipsychotic agents: N/A 5. Neuropsych: This patient is? capable of making decisions on her own behalf. 6. Skin/Wound Care: Routine pressure relief measures.  7. Fluids/Electrolytes/Nutrition: Monitor I/O. Check lytes in am. Cortrak removed 8. Seizure prophylaxis: On Keppra bid. 9. Urinary retention: On flomax and low dose urecholine.  --monitor voiding with PVR checks. Is voiding and hasn't required in/out caths in last 24 hours 10. Acute blood loss anemia:  11. Tachycardia:Has been up to low 100's but up to 140's today  --Repeat EKG.  12. Significant apraxia- per therapy  Jacquelynn Cree- PA-C 10/30/2020   I have personally performed a face to face diagnostic evaluation of this patient and formulated the key components of the plan.  Additionally, I have personally reviewed laboratory data, imaging studies, as well as relevant notes and concur with the physician assistant's documentation above.     Genice Rouge, MD 10/30/2020

## 2020-10-30 NOTE — H&P (Incomplete)
Physical Medicine and Rehabilitation Admission H&P    Chief Complaint  Patient presents with  . Functional deficits due to ICH    HPI: Jane Martin is a 25 year old female who was admitted 10/17/20 after collapsing at a gas station and was unresponsive with fixed dysconjugate gaze, right hemiplegia and agonal breathing. 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 CTA negative for AVM, aneurysm or high grade stenosis.  UDS positive for THC. She was taken to OR emergently for left parietal crani for evacuation of hemorrhage and placement of IVC by Dr. Danielle Dess. Hypertonic saline used to manage edema and serial CT head showed  Pathology showed benign brain parenchyma. 2D echo showed EF 55-60% and no wall abnormality. BLE dopplers were negative for DVT. She was found to be Covid + and placed on isolation.   MRV done for work up and was negative for mass or vascular lesion or venous thrombosis. MRI brain showed evacuated left parietal hematoma with resolution of midline shift, accentuated intravascular enhancement along superficial cavity--question reactive and no hydrocephalus. She was extubated without difficulty on 04/18 and IVC removed 04/19.  CT angiogram done for further work up on 04/27 and was negative for  Aneurysm, shunt, AVM, dissection, DAVF and focal irregularity of angular branch of L-MCA M4 region questioned due to mass effect or spasm. Dr.Deveshwar recommends repeat angiogram in 6 weeks (IR scheduler to call pt).  Neurology feels that bleed due to unknown etiology and recommends SBP goal <160. Asymptomatic bradycardia has resolved, foley placed due to urinary retention and cortak replaced briefly due to poor intake.  Dysphagia has resolved and she  is tolerating regular diet.   Patient with resultant bouts  of lethargy, expressive>receptive aphasia, RUE>RLE weakness and right facial droop. Therapy ongoing and CIR recommended due to functional decline.    Review of Systems  Constitutional: Negative for chills and fever.  HENT: Negative for ear discharge and hearing loss.   Eyes: Negative for blurred vision.  Respiratory: Negative for cough and shortness of breath.   Cardiovascular: Negative for chest pain and claudication.  Gastrointestinal: Negative for abdominal pain, constipation and heartburn.  Genitourinary: Negative for dysuria and hematuria.  Musculoskeletal: Negative for joint pain and myalgias.  Skin: Negative for rash.  Neurological: Positive for speech change and weakness. Negative for dizziness and headaches.  Psychiatric/Behavioral: The patient is not nervous/anxious.       History reviewed. No pertinent past medical history.    Past Surgical History:  Procedure Laterality Date  . CRANIOTOMY Left 10/17/2020   Procedure: CRANIOTOMY HEMATOMA Evacuation;  Surgeon: Barnett Abu, MD;  Location: [facility] OR;  Service: Neurosurgery;  Laterality: Left;    Family History  Problem Relation Age of Onset  . Hypertension Mother   . Healthy Father     Social History:  reports that she has never smoked. She has never used smokeless tobacco. She reports current alcohol use. She reports current drug use. Drug: Marijuana.   Allergies  Allergen Reactions  . Flagyl [Metronidazole]     Medications Prior to Admission  Medication Sig Dispense Refill  . Cetirizine HCl 10 MG CAPS Take 1 capsule (10 mg total) by mouth daily. 20 capsule 0  . fluticasone (FLONASE) 50 MCG/ACT nasal spray Place 2 sprays into both nostrils daily. 9.9 mL 2  .  azithromycin (ZITHROMAX) 500 MG tablet Take 1,000 mg by mouth once. (Patient not taking: Reported on 10/18/2020)    . clindamycin (CLEOCIN) 2 % vaginal cream Place 1 Applicatorful vaginally at bedtime. (Patient not taking: Reported on 10/18/2020) 40 g 0  .  clotrimazole (LOTRIMIN) 1 % cream Apply to affected area 2 times daily for up to one week. (Patient not taking: Reported on 10/18/2020) 15 g 0  . fexofenadine-pseudoephedrine (ALLEGRA-D) 60-120 MG 12 hr tablet Take 1 tablet by mouth every 12 (twelve) hours. (Patient not taking: Reported on 10/18/2020) 30 tablet 0  . fluconazole (DIFLUCAN) 150 MG tablet Take 1 tablet (150 mg total) by mouth daily. Take one tablet today.  May repeat in 3 days. (Patient not taking: No sig reported) 2 tablet 0    Drug Regimen Review { DRUG REGIMEN [id number]:04540}REVIEW:[id number]}  Home: Home Living Family/patient expects to be discharged to:: Private residence Living Arrangements: Parent (mother; works in lab at [employer]) Available Help at Discharge: Family Type of Home: Apartment Home Access: Stairs to enter Secretary/administratorntrance Stairs-Number of Steps: flight Home Layout: Able to live on main level with bedroom/bathroom Bathroom Shower/Tub: Tub/shower Surveyor, mineralsunit,Walk-in shower Bathroom Toilet: Standard Home Equipment: None Additional Comments: Some information from father inconsistent   Functional History: Prior Function Level of Independence: Independent Comments: All subjective information gathered from pt's father via video chat; per pt's father PTA, pt was independent and working as International aid/development workerassistant manager at [employer]  Functional Status:  Mobility: Bed Mobility Overal bed mobility: Needs Assistance Bed Mobility: Supine to Sit Rolling: Min assist Sidelying to sit: Mod assist,+2 for safety/equipment,+2 for physical assistance Supine to sit: Min guard,HOB elevated Sit to supine: Min assist Sit to sidelying: Max assist,+2 for physical assistance,+2 for safety/equipment General bed mobility comments: Incr time but no physical assist. Pt brought RUE around without cues Transfers Overall transfer level: Needs assistance Equipment used: 2 person hand held assist,Rolling walker (2 wheeled) Transfer via Lift Equipment: [employer] Transfers: Sit  to/from Merrill LynchStand Sit to Stand: Colgate-PalmoliveMin assist,+2 safety/equipment General transfer comment: Assist for balance and stability of RLE Ambulation/Gait Ambulation/Gait assistance: +2 physical assistance,Min assist Gait Distance (Feet): 100 Feet (x 2) Assistive device: Rolling walker (2 wheeled) Gait Pattern/deviations: Step-through pattern,Decreased step length - right,Decreased step length - left,Decreased dorsiflexion - right,Decreased stance time - right,Narrow base of support General Gait Details: Assist for balance and to monitor stability of RLE. Used 2 person hand held since with walker pt unable to maintain RUE on walker and managing walker requires more assist than without walker Gait velocity: decr Gait velocity interpretation: 1.31 - 2.62 ft/sec, indicative of limited community ambulator    ADL: ADL Overall ADL's : Needs assistance/impaired Eating/Feeding: NPO Grooming: Wash/dry face,Set up,Supervision/safety,Bed level Toilet Transfer: Minimal assistance,+2 for physical assistance,+2 for safety/equipment (simulated in room) Toilet Transfer Details (indicate cue type and reason): Min A +2 for balance and blocking of LLE Functional mobility during ADLs: Minimal assistance,+2 for physical assistance (side steps) General ADL Comments: Pt continues to present with deficits in cognition, functional use of LUE, balance, and arousal  Cognition: Cognition Overall Cognitive Status: Difficult to assess Arousal/Alertness: Lethargic Orientation Level: Oriented to person,Disoriented to place,Disoriented to time,Disoriented to situation Cognition Arousal/Alertness: Awake/alert Behavior During Therapy: Impulsive Overall Cognitive Status: Difficult to assess Area of Impairment: Attention,Memory,Following commands,Safety/judgement,Awareness,Problem solving Orientation Level:  (Did not ask orientation questions) Current Attention Level: Sustained Memory: Decreased recall of precautions,Decreased  short-term memory Following Commands: Follows one step commands consistently,Follows multi-step commands inconsistently Safety/Judgement: Decreased awareness of safety,Decreased  awareness of deficits Awareness: Intellectual Problem Solving: Requires verbal cues,Requires tactile cues General Comments: Rt inattention Difficult to assess due to: Impaired communication   Blood pressure 114/77, pulse 88, temperature 98 F (36.7 C), resp. rate 18, height 5\' 8"  (1.727 m), weight 72.6 kg, SpO2 100 %. Physical Exam Vitals and nursing note reviewed.  Constitutional:      Appearance: Normal appearance.  Cardiovascular:     Rate and Rhythm: Tachycardia present. Rhythm irregular.  Neurological:     Mental Status: She is alert.     Comments: Expressive/receptive aphasia. Unable to state month with choice of two, unable to follow 2 step commands or participate in MMT.      Results for orders placed or performed during the hospital encounter of 10/17/20 (from the past 48 hour(s))  Magnesium     Status: None   Collection Time: 10/29/20  5:00 AM  Result Value Ref Range   Magnesium 2.2 1.7 - 2.4 mg/dL    Comment: Performed at [facility] Lab, [address], [place], [place]  CBC with Differential/Platelet     Status: Abnormal   Collection Time: 10/29/20  5:00 AM  Result Value Ref Range   WBC 8.7 4.0 - 10.5 K/uL   RBC 3.08 (L) 3.87 - 5.11 MIL/uL   Hemoglobin 9.4 (L) 12.0 - 15.0 g/dL   HCT 10/31/20 (L) 44.8 - 18.5 %   MCV 90.9 80.0 - 100.0 fL   MCH 30.5 26.0 - 34.0 pg   MCHC 33.6 30.0 - 36.0 g/dL   RDW 63.1 49.7 - 02.6 %   Platelets 400 150 - 400 K/uL   nRBC 0.0 0.0 - 0.2 %   Neutrophils Relative % 57 %   Neutro Abs 5.0 1.7 - 7.7 K/uL   Lymphocytes Relative 29 %   Lymphs Abs 2.5 0.7 - 4.0 K/uL   Monocytes Relative 11 %   Monocytes Absolute 1.0 0.1 - 1.0 K/uL   Eosinophils Relative 1 %   Eosinophils Absolute 0.1 0.0 - 0.5 K/uL   Basophils Relative 1 %   Basophils Absolute 0.1  0.0 - 0.1 K/uL   Immature Granulocytes 1 %   Abs Immature Granulocytes 0.06 0.00 - 0.07 K/uL    Comment: Performed at [facility], [address] [place], [place]  Comprehensive metabolic panel     Status: Abnormal   Collection Time: 10/29/20  5:00 AM  Result Value Ref Range   Sodium 136 135 - 145 mmol/L   Potassium 3.9 3.5 - 5.1 mmol/L   Chloride 96 (L) 98 - 111 mmol/L   CO2 30 22 - 32 mmol/L   Glucose, Bld 94 70 - 99 mg/dL    Comment: Glucose reference range applies only to samples taken after fasting for at least 8 hours.   BUN 13 6 - 20 mg/dL   Creatinine, Ser 10/31/20 0.44 - 1.00 mg/dL   Calcium 2.77 8.9 - 41.2 mg/dL   Total Protein 7.3 6.5 - 8.1 g/dL   Albumin 3.6 3.5 - 5.0 g/dL   AST 47 (H) 15 - 41 U/L   ALT 113 (H) 0 - 44 U/L   Alkaline Phosphatase 91 38 - 126 U/L   Total Bilirubin 0.4 0.3 - 1.2 mg/dL   GFR, Estimated 87.8 >67 mL/min    Comment: (NOTE) Calculated using the CKD-EPI Creatinine Equation (2021)    Anion gap 10 5 - 15    Comment: Performed at [facility] Lab, [address] [place], [place] [address]  Magnesium     Status: None   Collection Time: 10/30/20  3:55 AM  Result Value Ref Range   Magnesium 2.1 1.7 - 2.4 mg/dL    Comment: Performed at [facility], [address], [place], [place] [address]  CBC with Differential/Platelet     Status: Abnormal   Collection Time: 10/30/20  3:55 AM  Result Value Ref Range   WBC 8.6 4.0 - 10.5 K/uL   RBC 3.05 (L) 3.87 - 5.11 MIL/uL   Hemoglobin 9.1 (L) 12.0 - 15.0 g/dL   HCT 09.8 (L) 11.9 - 14.7 %   MCV 91.1 80.0 - 100.0 fL   MCH 29.8 26.0 - 34.0 pg   MCHC 32.7 30.0 - 36.0 g/dL   RDW 82.9 56.2 - 13.0 %   Platelets 399 150 - 400 K/uL   nRBC 0.0 0.0 - 0.2 %   Neutrophils Relative % 55 %   Neutro Abs 4.8 1.7 - 7.7 K/uL   Lymphocytes Relative 30 %   Lymphs Abs 2.6 0.7 - 4.0 K/uL   Monocytes Relative 11 %   Monocytes Absolute 1.0 0.1 - 1.0 K/uL   Eosinophils Relative 2 %   Eosinophils  Absolute 0.1 0.0 - 0.5 K/uL   Basophils Relative 1 %   Basophils Absolute 0.0 0.0 - 0.1 K/uL   Immature Granulocytes 1 %   Abs Immature Granulocytes 0.07 0.00 - 0.07 K/uL    Comment: Performed at [facility], [address], [place], [place] [address]  Comprehensive metabolic panel     Status: Abnormal   Collection Time: 10/30/20  3:55 AM  Result Value Ref Range   Sodium 137 135 - 145 mmol/L   Potassium 3.9 3.5 - 5.1 mmol/L   Chloride 101 98 - 111 mmol/L   CO2 27 22 - 32 mmol/L   Glucose, Bld 85 70 - 99 mg/dL    Comment: Glucose reference range applies only to samples taken after fasting for at least 8 hours.   BUN 11 6 - 20 mg/dL   Creatinine, Ser 4.69 0.44 - 1.00 mg/dL   Calcium 9.5 8.9 - 62.9 mg/dL   Total Protein 6.9 6.5 - 8.1 g/dL   Albumin 3.4 (L) 3.5 - 5.0 g/dL   AST 41 15 - 41 U/L   ALT 97 (H) 0 - 44 U/L   Alkaline Phosphatase 86 38 - 126 U/L   Total Bilirubin 0.4 0.3 - 1.2 mg/dL   GFR, Estimated >52 >84 mL/min    Comment: (NOTE) Calculated using the CKD-EPI Creatinine Equation (2021)    Anion gap 9 5 - 15    Comment: Performed at [facility], [address] [place], [place] [address]   No results found.     Medical Problem List and Plan: 1.  *** secondary to ***  -patient may *** shower  -ELOS/Goals: *** 2.  Antithrombotics: -DVT/anticoagulation:  Pharmaceutical: Lovenox  -antiplatelet therapy: N/a 3. Pain Management: Tylenol prn.  4. Mood: LCSW to follow for evaluation and support.   -antipsychotic agents: N/A 5. Neuropsych: This patient *** capable of making decisions on *** own behalf. 6. Skin/Wound Care: Routine pressure relief measures.  7. Fluids/Electrolytes/Nutrition: Monitor I/O. Check lytes in am.  8. Seizure prophylaxis: On Keppra bid. 9. Urinary retention: On flomax and low dose urecholine.  --monitor voiding with PVR checks.  10. Acute blood loss anemia:  11. Tachycardia:Has been up to low 100's but up  to 140's today  --Repeat  EKG.     ***  Jacquelynn Cree, PA-C 10/30/2020

## 2020-10-30 NOTE — Progress Notes (Signed)
Patient ID: Jane Martin, female   DOB: [birth year], 25 y.o.   MRN: [id number] Met with the patient to introduce role of the nurse CM and initiate education. Reviewed issues identified by MD; apraxia, nystagmus, with the patient and rehab routine along with the plan of care. Discussed collaboration with the SW to facilitate preparation for discharge. Continue to follow along to discharge to address questions. No other concerns noted at present. Margarito Liner, RN

## 2020-10-30 NOTE — Progress Notes (Signed)
Inpatient Rehab Admissions Coordinator:    I have insurance authorization and a bed available for pt to admit to CIR today.  Dr. Thedore Mins in agreement.  Will let pt/family and TOC team know.   Estill Dooms, PT, DPT Admissions Coordinator [phone] 10/30/20  11:11 AM

## 2020-10-30 NOTE — Progress Notes (Signed)
PMR Admission Coordinator Pre-Admission Assessment   Patient: Jane Martin is an 25 y.o., female MRN: [id number] DOB: 02/04/1996 Height: 5\' 8"  (172.7 cm) Weight: 72.6 kg                                                                                                                                                  Insurance Information HMO:     PPO: yes     PCP:      IPA:      80/20:      OTHER:  PRIMARY      Policy#: Emeline Darling      Subscriber: pt's mother [id number]) CM Name: Wilnette Kales      Phone#: [phone]     Fax#: [phone] Pre-Cert#: [id number] auth for CIR via fax for 7 days with updates due to fax listed above on 11/05/20      Employer:  Benefits:  Phone #: [phone]     Name:  Eff. Date: 07/05/16     Deduct: $0      Out of Pocket Max: $4000 [phone])      Life Max: n/a  CIR: 60%      SNF: 60% Outpatient: 60%     Co-Ins: 40% Home Health: 60%      Co-Ins: 40% DME: 60%     Co-Ins: 40% Providers:  SECONDARY: [employer]      Policy#: [id number]      Phone#: [phone]   Financial Counselor:       Phone#:    The "Data Collection Information Summary" for patients in Inpatient Rehabilitation Facilities with attached "Privacy Act Statement-Health Care Records" was provided and verbally reviewed with: N/A   Emergency Contact Information         Contact Information     Name Relation Home Work Mobile    [place] Mother [phone]        ALLISSON, SCHINDEL Father     [phone]       Current Medical History  Patient Admitting Diagnosis: 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  When EMS arrived pt was unresponsive and so intubated in the field.  CT head showed ICH with midline shift and she was taken to the OR emergently for craniotomy on 4/15 per Dr. 5/15.  Extubated 4/18 CTA head/neck  showed to aneurysm or large vessel occlusion.  F/u imaging showed evacuation of hematoma and resolved mass effect.  Echo normal.  Negative for DVTs in LEs.  Pt underwent cerebral angiogram per Dr. 5/18 which showed an abdominal aneurysm only.  On keppra and tolerating soft diet.  Therapy evaluations were completed and recommendations were for CIR.  Complete NIHSS TOTAL: 4 Glasgow Coma Scale Score: 14   Past Medical History  History reviewed. No pertinent past medical history.   Family History  family history includes Healthy in her father; Hypertension in her mother.   Prior Rehab/Hospitalizations:  Has the patient had prior rehab or hospitalizations prior to admission? No   Has the patient had major surgery during 100 days prior to admission? Yes   Current Medications    Current Facility-Administered Medications:  .   stroke: mapping our early stages of recovery book, , Does not apply, Once, Barnett Abu, MD .  acetaminophen (TYLENOL) tablet 650 mg, 650 mg, Oral, Q4H PRN, 650 mg at 10/23/20 0003 **OR** [DISCONTINUED] acetaminophen (TYLENOL) 160 MG/5ML solution 650 mg, 650 mg, Per Tube, Q4H PRN, 650 mg at 10/25/20 0803 **OR** [DISCONTINUED] acetaminophen (TYLENOL) suppository 650 mg, 650 mg, Rectal, Q4H PRN, Barnett Abu, MD .  artificial tears (LACRILUBE) ophthalmic ointment 1 application, 1 application, Both Eyes, PRN, Luciano Cutter, MD .  bethanechol (URECHOLINE) tablet 10 mg, 10 mg, Oral, TID, Shalhoub, Deno Lunger, MD, 10 mg at 10/30/20 0857 .  chlorhexidine (PERIDEX) 0.12 % solution 15 mL, 15 mL, Mouth Rinse, BID, Marvel Plan, MD, 15 mL at 10/30/20 0857 .  docusate sodium (COLACE) capsule 100 mg, 100 mg, Oral, BID, Leroy Sea, MD, 100 mg at 10/30/20 0857 .  feeding supplement (ENSURE ENLIVE / ENSURE PLUS) liquid 237 mL, 237 mL, Oral, BID BM, Leroy Sea, MD, 237 mL at 10/30/20 0857 .  heparin injection 5,000 Units, 5,000 Units, Subcutaneous, Q8H, Marvel Plan,  MD, 5,000 Units at 10/30/20 [phone] .  labetalol (NORMODYNE) injection 10 mg, 10 mg, Intravenous, Q4H PRN, Olivencia-Simmons, Ivelisse, NP .  levETIRAcetam (KEPPRA) tablet 500 mg, 500 mg, Oral, BID, Marvel Plan, MD, 500 mg at 10/30/20 0856 .  [DISCONTINUED] ondansetron (ZOFRAN) tablet 4 mg, 4 mg, Per Tube, Q4H PRN **OR** ondansetron (ZOFRAN) injection 4 mg, 4 mg, Intravenous, Q4H PRN, Elsner, Henry, MD .  polyethylene glycol (MIRALAX / GLYCOLAX) packet 17 g, 17 g, Oral, Daily PRN, Leroy Sea, MD .  senna (SENOKOT) tablet 8.6 mg, 1 tablet, Oral, QHS PRN, Susa Raring K, MD .  sodium chloride flush (NS) 0.9 % injection 10-40 mL, 10-40 mL, Intracatheter, Q12H, Barnett Abu, MD, 10 mL at 10/30/20 0902 .  tamsulosin (FLOMAX) capsule 0.4 mg, 0.4 mg, Oral, Daily, Leroy Sea, MD, 0.4 mg at 10/30/20 5035   Patients Current Diet:     Diet Order                      DIET SOFT Room service appropriate? Yes; Fluid consistency: Thin  Diet effective now                      Precautions / Restrictions Precautions Precautions: Fall Precaution Comments: R inattention Restrictions Weight Bearing Restrictions: No    Has the patient had 2 or more falls or a fall with injury in the past year?No   Prior Activity Level Community (5-7x/wk): working as a Naval architect, driving, no DME at baseline   Prior Functional Level Prior Function Level of Independence: Independent Comments: All subjective information gathered from pt's father via video chat; per pt's father PTA, 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 [employer]   Self Care: Did the patient need help bathing, dressing, using the toilet or eating?  Independent   Indoor Mobility: Did the patient need assistance  with walking from room to room (with or without device)? Independent   Stairs: Did the patient need assistance with internal or external stairs (with or without device)? Independent   Functional  Cognition: Did the patient need help planning regular tasks such as shopping or remembering to take medications? Independent   Home Assistive Devices / Equipment Home Equipment: None   Prior Device Use: Indicate devices/aids used by the patient prior to current illness, exacerbation or injury? None of the above   Current Functional Level Cognition   Arousal/Alertness: Lethargic Overall Cognitive Status: Difficult to assess Difficult to assess due to: Impaired communication Current Attention Level: Sustained Orientation Level: Oriented to person,Disoriented to place,Disoriented to time,Disoriented to situation Following Commands: Follows one step commands consistently,Follows multi-step commands inconsistently Safety/Judgement: Decreased awareness of safety,Decreased awareness of deficits General Comments: Rt inattention    Extremity Assessment (includes Sensation/Coordination)   Upper Extremity Assessment: RUE deficits/detail RUE Deficits / Details: Able to activate movement. Attempting to left arm off the bed; abel to intitate movement RUE Coordination: decreased fine motor,decreased gross motor LUE Deficits / Details: Poor coorindation and difficult to assess with cognition and arousal. WFL for strength LUE Coordination: decreased fine motor,decreased gross motor  Lower Extremity Assessment: Defer to PT evaluation RLE Deficits / Details: no movement observed, RLE movement with painful stimuli LLE Deficits / Details: hip flex/ext, knee flex/ext observed during exam, unable to follow commands at this time     ADLs   Overall ADL's : Needs assistance/impaired Eating/Feeding: NPO Grooming: Wash/dry face,Set up,Supervision/safety,Bed level Toilet Transfer: Minimal assistance,+2 for physical assistance,+2 for safety/equipment (simulated in room) Toilet Transfer Details (indicate cue type and reason): Min A +2 for balance and blocking of LLE Functional mobility during ADLs: Minimal  assistance,+2 for physical assistance (side steps) General ADL Comments: Pt continues to present with deficits in cognition, functional use of LUE, balance, and arousal     Mobility   Overal bed mobility: Needs Assistance Bed Mobility: Supine to Sit Rolling: Min assist Sidelying to sit: Mod assist,+2 for safety/equipment,+2 for physical assistance Supine to sit: Min guard,HOB elevated Sit to supine: Min assist Sit to sidelying: Max assist,+2 for physical assistance,+2 for safety/equipment General bed mobility comments: Incr time but no physical assist. Pt brought RUE around without cues     Transfers   Overall transfer level: Needs assistance Equipment used: 2 person hand held assist,Rolling walker (2 wheeled) Transfer via Lift Equipment: [employer] Transfers: Sit to/from Stand Sit to Stand: [employer] safety/equipment General transfer comment: Assist for balance and stability of RLE     Ambulation / Gait / Stairs / Wheelchair Mobility   Ambulation/Gait Ambulation/Gait assistance: +2 physical assistance,Min assist Gait Distance (Feet): 100 Feet (x 2) Assistive device: Rolling walker (2 wheeled) Gait Pattern/deviations: Step-through pattern,Decreased step length - right,Decreased step length - left,Decreased dorsiflexion - right,Decreased stance time - right,Narrow base of support General Gait Details: Assist for balance and to monitor stability of RLE. Used 2 person hand held since with walker pt unable to maintain RUE on walker and managing walker requires more assist than without walker Gait velocity: decr Gait velocity interpretation: 1.31 - 2.62 ft/sec, indicative of limited community ambulator     Posture / Balance Dynamic Sitting Balance Sitting balance - Comments: Multidirectional LOB, LOB in direction of head movement. PT supporting pt trunk posteriorly, requiring min-mod assist Balance Overall balance assessment: Needs assistance Sitting-balance support: No upper extremity  supported,Feet supported Sitting balance-Leahy Scale: Fair Sitting balance - Comments: Multidirectional LOB, LOB in  direction of head movement. PT supporting pt trunk posteriorly, requiring min-mod assist Standing balance support: Single extremity supported,Bilateral upper extremity supported Standing balance-Leahy Scale: Poor Standing balance comment: UE support and min assist for static standing     Special needs/care consideration Skin crani incision        Previous Home Environment (from acute therapy documentation) Living Arrangements: Parent (mother; works in lab at [employer]) Available Help at Discharge: Family Type of Home: Apartment Home Layout: Able to live on main level with bedroom/bathroom Home Access: Stairs to enter Enter[employer] CorporationEntrance Stairs-Number of Steps: flight Bathroom Shower/Tub: Stage managerTub/shower unit,Walk-in shower Bathroom Toilet: Standard Additional Comments: Some information from father inconsistent   Discharge Living Setting Plans for Discharge Living Setting: Lives with (comment) (mom) Type of Home at Discharge: Apartment Discharge Home Layout: One level Discharge Home Access: Stairs to enter Entrance Stairs-Number of Steps: full flight Discharge Bathroom Shower/Tub: Tub/shower unit Discharge Bathroom Toilet: Standard Discharge Bathroom Accessibility: Yes How Accessible: Accessible via walker Does the patient have any problems obtaining your medications?: No   Social/Family/Support Systems Anticipated Caregiver: mom, Bary Lerichehelma Wilson (pt's sister also available to help PRN) Anticipated Caregiver's Contact Information: Wilnette Kaleshelma [phone] Ability/Limitations of Caregiver: n/a, she plans to take time off of work once pt ready to d/c from CIR Caregiver Availability: 24/7 Discharge Plan Discussed with Primary Caregiver: Yes Is Caregiver In Agreement with Plan?: Yes Does Caregiver/Family have Issues with Lodging/Transportation while Pt is in Rehab?: No      Goals Patient/Family Goal for Rehab: PT/OT/SLP supervision Expected length of stay: 12-16 days Pt/Family Agrees to Admission and willing to participate: Yes Program Orientation Provided & Reviewed with Pt/Caregiver Including Roles  & Responsibilities: Yes     Decrease burden of Care through IP rehab admission: n/a   Possible need for SNF placement upon discharge: No      Patient Condition: This patient's medical and functional status has changed since the consult dated: 4/25 in which the Rehabilitation Physician determined and documented that the patient's condition is appropriate for intensive rehabilitative care in an inpatient rehabilitation facility. See "History of Present Illness" (above) for medical update. Functional changes are: pt min assist with mobility. Patient's medical and functional status update has been discussed with the Rehabilitation physician and patient remains appropriate for inpatient rehabilitation. Will admit to inpatient rehab today.   Preadmission Screen Completed By:  Stephania Fragminaitlin E Kiwanna Spraker, PT, DPT 10/30/2020 11:16 AM ______________________________________________________________________   Discussed status with Dr. Berline ChoughLovorn on 10/30/20 at 11:35 AM  and received approval for admission today.   Admission Coordinator:  Stephania Fragminaitlin E Calhoun Reichardt, PT, DPT time 11:35 AM Dorna Bloom/Date 10/30/20              Cosigned by: Genice RougeLovorn, Megan, MD at 10/30/2020 11:47 AM

## 2020-10-30 NOTE — PMR Pre-admission (Signed)
PMR Admission Coordinator Pre-Admission Assessment  Patient: Jane Martin is an 25 y.o., female MRN: [id number] DOB: 07/19/1995 Height: 5\' 8"  (172.7 cm) Weight: 72.6 kg              Insurance Information HMO:     PPO: yes     PCP:      IPA:      80/20:      OTHER:  PRIMARY      Policy#: Jane Martin      Subscriber: pt's mother [id number]) CM Name: Jane Martin      Phone#: [phone]     Fax#: [phone] Pre-Cert#: [id number] auth for CIR via fax for 7 days with updates due to fax listed above on 11/05/20      Employer:  Benefits:  Phone #: [phone]     Name:  Eff. Date: 07/05/16     Deduct: $0      Out of Pocket Max: $[phone])      Life Max: n/a  CIR: 60%      SNF: 60% Outpatient: 60%     Co-Ins: 40% Home Health: 60%      Co-Ins: 40% DME: 60%     Co-Ins: 40% Providers:  SECONDARY: [employer]      Policy#: [id number]      Phone#: [phone]  Financial Counselor:       Phone#:   The "Data Collection Information Summary" for patients in Inpatient Rehabilitation Facilities with attached "Privacy Act Statement-Health Care Records" was provided and verbally reviewed with: N/A  Emergency Contact Information Contact Information    Name Relation Home Work Mobile   Jane Martin Mother [phone]     Jane Martin Father   [phone]     Current Medical History  Patient Admitting Diagnosis: 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  When EMS arrived pt was unresponsive and so intubated in the field.  CT head showed ICH with midline shift and she was taken to the OR emergently for craniotomy on 4/15 per Dr. 5/15.  Extubated 4/18 CTA head/neck showed to aneurysm or large vessel occlusion.  F/u imaging showed evacuation of hematoma and resolved mass effect.  Echo normal.  Negative for DVTs in LEs.   Pt underwent cerebral angiogram per Dr. 5/18 which showed an abdominal aneurysm only.  On keppra and tolerating soft diet.  Therapy evaluations were completed and recommendations were for CIR.    Complete NIHSS TOTAL: 4 Glasgow Coma Scale Score: 14  Past Medical History  History reviewed. No pertinent past medical history.  Family History  family history includes Healthy in her father; Hypertension in her mother.  Prior Rehab/Hospitalizations:  Has the patient had prior rehab or hospitalizations prior to admission? No  Has the patient had major surgery during 100 days prior to admission? Yes  Current Medications   Current Facility-Administered Medications:  .   stroke: mapping our early stages of recovery book, , Does not apply, Once, Titus Dubin, MD .  acetaminophen (TYLENOL) tablet 650 mg, 650 mg, Oral, Q4H PRN, 650 mg at 10/23/20 0003 **OR** [DISCONTINUED] acetaminophen (TYLENOL) 160 MG/5ML solution 650 mg, 650 mg, Per Tube, Q4H PRN, 650 mg at 10/25/20 0803 **OR** [DISCONTINUED] acetaminophen (TYLENOL) suppository 650 mg, 650 mg, Rectal, Q4H PRN, 10/27/20, MD .  artificial tears (LACRILUBE) ophthalmic ointment 1 application,  1 application, Both Eyes, PRN, Luciano Cutter, MD .  bethanechol (URECHOLINE) tablet 10 mg, 10 mg, Oral, TID, Shalhoub, Deno Lunger, MD, 10 mg at 10/30/20 0857 .  chlorhexidine (PERIDEX) 0.12 % solution 15 mL, 15 mL, Mouth Rinse, BID, Marvel Plan, MD, 15 mL at 10/30/20 0857 .  docusate sodium (COLACE) capsule 100 mg, 100 mg, Oral, BID, Leroy Sea, MD, 100 mg at 10/30/20 0857 .  feeding supplement (ENSURE ENLIVE / ENSURE PLUS) liquid 237 mL, 237 mL, Oral, BID BM, Leroy Sea, MD, 237 mL at 10/30/20 0857 .  heparin injection 5,000 Units, 5,000 Units, Subcutaneous, Q8H, Marvel Plan, MD, 5,000 Units at 10/30/20 [phone] .  labetalol (NORMODYNE) injection 10 mg, 10 mg, Intravenous, Q4H PRN, Olivencia-Simmons, Ivelisse, NP .  levETIRAcetam (KEPPRA)  tablet 500 mg, 500 mg, Oral, BID, Marvel Plan, MD, 500 mg at 10/30/20 0856 .  [DISCONTINUED] ondansetron (ZOFRAN) tablet 4 mg, 4 mg, Per Tube, Q4H PRN **OR** ondansetron (ZOFRAN) injection 4 mg, 4 mg, Intravenous, Q4H PRN, Elsner, Henry, MD .  polyethylene glycol (MIRALAX / GLYCOLAX) packet 17 g, 17 g, Oral, Daily PRN, Leroy Sea, MD .  senna (SENOKOT) tablet 8.6 mg, 1 tablet, Oral, QHS PRN, Susa Raring K, MD .  sodium chloride flush (NS) 0.9 % injection 10-40 mL, 10-40 mL, Intracatheter, Q12H, Barnett Abu, MD, 10 mL at 10/30/20 0902 .  tamsulosin (FLOMAX) capsule 0.4 mg, 0.4 mg, Oral, Daily, Leroy Sea, MD, 0.4 mg at 10/30/20 6010  Patients Current Diet:  Diet Order            DIET SOFT Room service appropriate? Yes; Fluid consistency: Thin  Diet effective now                 Precautions / Restrictions Precautions Precautions: Fall Precaution Comments: R inattention Restrictions Weight Bearing Restrictions: No   Has the patient had 2 or more falls or a fall with injury in the past year?No  Prior Activity Level Community (5-7x/wk): working as a Naval architect, driving, no DME at baseline  Prior Functional Level Prior Function Level of Independence: Independent Comments: All subjective information gathered from pt's father via video chat; per pt's father PTA, 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 [employer]  Self Care: Did the patient need help bathing, dressing, using the toilet or eating?  Independent  Indoor Mobility: Did the patient need assistance with walking from room to room (with or without device)? Independent  Stairs: Did the patient need assistance with internal or external stairs (with or without device)? Independent  Functional Cognition: Did the patient need help planning regular tasks such as shopping or remembering to take medications? Independent  Home Assistive Devices / Equipment Home Equipment: None  Prior  Device Use: Indicate devices/aids used by the patient prior to current illness, exacerbation or injury? None of the above  Current Functional Level Cognition  Arousal/Alertness: Lethargic Overall Cognitive Status: Difficult to assess Difficult to assess due to: Impaired communication Current Attention Level: Sustained Orientation Level: Oriented to person,Disoriented to place,Disoriented to time,Disoriented to situation Following Commands: Follows one step commands consistently,Follows multi-step commands inconsistently Safety/Judgement: Decreased awareness of safety,Decreased awareness of deficits General Comments: Rt inattention    Extremity Assessment (includes Sensation/Coordination)  Upper Extremity Assessment: RUE deficits/detail RUE Deficits / Details: Able to activate movement. Attempting to left arm off the bed; abel to intitate movement RUE Coordination: decreased fine motor,decreased gross motor LUE Deficits / Details: Poor coorindation and difficult to  assess with cognition and arousal. WFL for strength LUE Coordination: decreased fine motor,decreased gross motor  Lower Extremity Assessment: Defer to PT evaluation RLE Deficits / Details: no movement observed, RLE movement with painful stimuli LLE Deficits / Details: hip flex/ext, knee flex/ext observed during exam, unable to follow commands at this time    ADLs  Overall ADL's : Needs assistance/impaired Eating/Feeding: NPO Grooming: Wash/dry face,Set up,Supervision/safety,Bed level Toilet Transfer: Minimal assistance,+2 for physical assistance,+2 for safety/equipment (simulated in room) Toilet Transfer Details (indicate cue type and reason): Min A +2 for balance and blocking of LLE Functional mobility during ADLs: Minimal assistance,+2 for physical assistance (side steps) General ADL Comments: Pt continues to present with deficits in cognition, functional use of LUE, balance, and arousal    Mobility  Overal bed mobility:  Needs Assistance Bed Mobility: Supine to Sit Rolling: Min assist Sidelying to sit: Mod assist,+2 for safety/equipment,+2 for physical assistance Supine to sit: Min guard,HOB elevated Sit to supine: Min assist Sit to sidelying: Max assist,+2 for physical assistance,+2 for safety/equipment General bed mobility comments: Incr time but no physical assist. Pt brought RUE around without cues    Transfers  Overall transfer level: Needs assistance Equipment used: 2 person hand held assist,Rolling walker (2 wheeled) Transfer via Lift Equipment: [employer] Transfers: Sit to/from Stand Sit to Stand: [employer] safety/equipment General transfer comment: Assist for balance and stability of RLE    Ambulation / Gait / Stairs / Wheelchair Mobility  Ambulation/Gait Ambulation/Gait assistance: +2 physical assistance,Min assist Gait Distance (Feet): 100 Feet (x 2) Assistive device: Rolling walker (2 wheeled) Gait Pattern/deviations: Step-through pattern,Decreased step length - right,Decreased step length - left,Decreased dorsiflexion - right,Decreased stance time - right,Narrow base of support General Gait Details: Assist for balance and to monitor stability of RLE. Used 2 person hand held since with walker pt unable to maintain RUE on walker and managing walker requires more assist than without walker Gait velocity: decr Gait velocity interpretation: 1.31 - 2.62 ft/sec, indicative of limited community ambulator    Posture / Balance Dynamic Sitting Balance Sitting balance - Comments: Multidirectional LOB, LOB in direction of head movement. PT supporting pt trunk posteriorly, requiring min-mod assist Balance Overall balance assessment: Needs assistance Sitting-balance support: No upper extremity supported,Feet supported Sitting balance-Leahy Scale: Fair Sitting balance - Comments: Multidirectional LOB, LOB in direction of head movement. PT supporting pt trunk posteriorly, requiring min-mod  assist Standing balance support: Single extremity supported,Bilateral upper extremity supported Standing balance-Leahy Scale: Poor Standing balance comment: UE support and min assist for static standing    Special needs/care consideration Skin crani incision     Previous Home Environment (from acute therapy documentation) Living Arrangements: Parent (mother; works in lab at [employer]) Available Help at Discharge: Family Type of Home: Apartment Home Layout: Able to live on main level with bedroom/bathroom Home Access: Stairs to enter [employer] of Steps: flight Bathroom Shower/Tub: Stage manager: Standard Additional Comments: Some information from father inconsistent  Discharge Living Setting Plans for Discharge Living Setting: Lives with (comment) (mom) Type of Home at Discharge: Apartment Discharge Home Layout: One level Discharge Home Access: Stairs to enter Entrance Stairs-Number of Steps: full flight Discharge Bathroom Shower/Tub: Tub/shower unit Discharge Bathroom Toilet: Standard Discharge Bathroom Accessibility: Yes How Accessible: Accessible via walker Does the patient have any problems obtaining your medications?: No  Social/Family/Support Systems Anticipated Caregiver: mom, [name] (pt's sister also available to help PRN) Anticipated Caregiver's Contact Information: Jane Martin [phone] Ability/Limitations of Caregiver: n/a, she plans to  take time off of work once [facility] Care[facility]iver Availability: 24/7 Discharge Plan Discussed with Primary Caregiver: Yes Is Caregiver In Agreement with Plan?: Yes Does Caregiver/Family have Issues with Lodging/Transportation while Pt is in Rehab?: No   Goals Patient/Family Goal for Rehab: PT/OT/SLP supervision Expected length of stay: 12-16 days Pt/Family Agrees to Admission and willing to participate: Yes Program Orientation Provided & Reviewed with Pt/Caregiver Including  Roles  & Responsibilities: Yes   Decrease burden of Care through IP rehab admission: n/a  Possible need for SNF placement upon discharge: No    Patient Condition: This patient's medical and functional status has changed since the consult dated: 4/25 in which the Rehabilitation Physician determined and documented that the patient's condition is appropriate for intensive rehabilitative care in an inpatient rehabilitation facility. See "History of Present Illness" (above) for medical update. Functional changes are: pt min assist with mobility. Patient's medical and functional status update has been discussed with the Rehabilitation physician and patient remains appropriate for inpatient rehabilitation. Will admit to inpatient rehab today.  Preadmission Screen Completed By:  Stephania Fragminaitlin E Gildo Crisco, PT, DPT 10/30/2020 11:16 AM ______________________________________________________________________   Discussed status with Dr. Berline ChoughLovorn on 10/30/20 at 11:35 AM  and received approval for admission today.  Admission Coordinator:  Stephania Fragminaitlin E Avia Merkley, PT, DPT time 11:35 AM Dorna Bloom/Date 10/30/20

## 2020-10-30 NOTE — Progress Notes (Signed)
Physical Medicine and Rehabilitation Consult Reason for Consult: Altered mental status Referring Physician: Dr. Thedore Mins     HPI: Jane Martin is a 25 y.o. right-handed female with unremarkable past medical history.  Per chart review patient lives with her mother independent prior to admission working as a Production designer, theatre/television/film at [employer].  Two-level home bed and bath on main level.  Presented 10/17/2020 after collapsing at a gas station.  Required intubation for airway protection.  Cranial CT scan showed a 5.6 cm and long axis acute left frontal parietal intraparenchymal hematoma decompressing into left lateral ventricle which contains a substantial amount of acute blood products which extends into the third ventricle and a 1 cm left to right midline shift with effacement of the basal cisterns.  CT cervical spine negative.  CTA of the head showed no intracranial aneurysm or AVM.  Patient underwent left parietal craniotomy with evacuation of intracerebral hemorrhage placement of intraventricular catheter 10/17/2020 per Dr. Danielle Dess and she was extubated 10/20/2020.  Hospital course patient was found to be COVID-positive requiring isolation through 10/26/2020.  Latest MRI of the brain 10/19/2020 showed no mass or definitive vascular lesion underlying the evacuated left parietal hematoma.  Resolved midline shift.  No hydrocephalus.  Keppra ongoing for seizure prophylaxis.  Awaiting plan for cerebral angiogram.  Echocardiogram with ejection fraction of 55 to 60%.  Bilateral lower extremity Dopplers negative for DVT.  Patient was cleared to begin subcutaneous heparin for DVT prophylaxis 10/19/2020.  Currently on a full liquid diet as well as alternative means of nutritional support..  Therapy evaluations completed due to patient decreased functional mobility recommendations at this   Sister at bedside  Review of Systems  Constitutional: Negative for chills and fever.  HENT: Negative for hearing loss.   Eyes:  Negative for blurred vision and double vision.  Respiratory: Negative for cough and shortness of breath.   Cardiovascular: Negative for chest pain, palpitations and leg swelling.  Gastrointestinal: Positive for constipation. Negative for heartburn, nausea and vomiting.  Genitourinary: Negative for dysuria and hematuria.  Skin: Negative for rash.  Neurological: Positive for speech change.  All other systems reviewed and are negative.   No past medical history on file.      Past Surgical History:  Procedure Laterality Date  . CRANIOTOMY Left 10/17/2020    Procedure: CRANIOTOMY HEMATOMA Evacuation;  Surgeon: Barnett Abu, MD;  Location: [facility] OR;  Service: Neurosurgery;  Laterality: Left;         Family History  Problem Relation Age of Onset  . Hypertension Mother    . Healthy Father      Social History:  reports that she has never smoked. She has never used smokeless tobacco. She reports current alcohol use. She reports current drug use. Drug: Marijuana. Allergies:      Allergies  Allergen Reactions  . Flagyl [Metronidazole]            Medications Prior to Admission  Medication Sig Dispense Refill  . Cetirizine HCl 10 MG CAPS Take 1 capsule (10 mg total) by mouth daily. 20 capsule 0  . fluticasone (FLONASE) 50 MCG/ACT nasal spray Place 2 sprays into both nostrils daily. 9.9 mL 2  . azithromycin (ZITHROMAX) 500 MG tablet Take 1,000 mg by mouth once. (Patient not taking: Reported on 10/18/2020)      . clindamycin (CLEOCIN) 2 % vaginal cream Place 1 Applicatorful vaginally at bedtime. (Patient not taking: Reported on 10/18/2020) 40 g 0  .  clotrimazole (LOTRIMIN) 1 % cream Apply to affected area 2 times daily for up to one week. (Patient not taking: Reported on 10/18/2020) 15 g 0  . fexofenadine-pseudoephedrine (ALLEGRA-D) 60-120 MG 12 hr tablet Take 1 tablet by mouth every 12 (twelve) hours. (Patient not taking: Reported on 10/18/2020) 30 tablet 0  . fluconazole (DIFLUCAN) 150 MG tablet  Take 1 tablet (150 mg total) by mouth daily. Take one tablet today.  May repeat in 3 days. (Patient not taking: No sig reported) 2 tablet 0      Home: Home Living Family/patient expects to be discharged to:: Private residence Living Arrangements: Parent (mother; works in lab at [employer] Available Help at Discharge: Family Type of Home: Apartment Home Access: Stairs to enter Secretary/administrator of Steps: flight Home Layout: Able to live on main level with bedroom/bathroom Bathroom Shower/Tub: Tub/shower Surveyor, minerals: Standard Home Equipment: None Additional Comments: Some information from father inconsistent  Functional History: Prior Function Level of Independence: Independent Comments: All subjective information gathered from pt's father via video chat; per pt's father PTA, 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 [employer] Status:  Mobility: Bed Mobility Overal bed mobility: Needs Assistance Bed Mobility: Supine to Sit,Sit to [employer]: Min assist Sidelying to sit: Mod assist,+2 for safety/equipment,+2 for physical assistance Supine to sit: Mod assist,+2 for physical assistance Sit to supine: Min assist Sit to sidelying: Max assist,+2 for physical assistance,+2 for safety/equipment General bed mobility comments: pt requries assistance to initiate supine to sit, needing physical assist to move LEs off edge of bed and elevate trunk Transfers Overall transfer level: Needs assistance Equipment used: 1 person hand held assist,2 person hand held assist Transfer via Lift Equipment: [employer] Transfers: Sit to/from Stand Sit to Stand: [employer] safety/equipment General transfer comment: pt requires hand hold to pull into stand and maintain balance Ambulation/Gait Ambulation/Gait assistance: Mod assist,+2 physical assistance Gait Distance (Feet): 3 Feet Assistive device: 2 person hand held assist Gait  Pattern/deviations: Shuffle General Gait Details: pt side stepping toward R at edge of bed, needs assistance from PT to advance RLE as well as facilitation of weight shift toward left. Pt later marches and is able to lift both feet off ground Gait velocity: reduced Gait velocity interpretation: <1.31 ft/sec, indicative of household ambulator   ADL: ADL Overall ADL's : Needs assistance/impaired Eating/Feeding: NPO Grooming: Wash/dry face,Set up,Supervision/safety,Bed level Toilet Transfer: Minimal assistance,+2 for physical assistance,+2 for safety/equipment (simulated in room) Toilet Transfer Details (indicate cue type and reason): Min A +2 for balance and blocking of LLE Functional mobility during ADLs: Minimal assistance,+2 for physical assistance (side steps) General ADL Comments: Pt continues to present with deficits in cognition, functional use of LUE, balance, and arousal   Cognition: Cognition Overall Cognitive Status: Impaired/Different from baseline Arousal/Alertness: Lethargic Orientation Level: Oriented to person,Oriented to place (Confused) Cognition Arousal/Alertness: Awake/alert Behavior During Therapy: Flat affect,Impulsive Overall Cognitive Status: Impaired/Different from baseline Area of Impairment: Orientation,Attention,Memory,Following commands,Safety/judgement,Awareness,Problem solving Orientation Level: Disoriented to,Place,Time,Situation Current Attention Level: Focused,Sustained (short duration) Memory: Decreased recall of precautions,Decreased short-term memory Following Commands: Follows one step commands with increased time Safety/Judgement: Decreased awareness of safety,Decreased awareness of deficits Awareness: Intellectual Problem Solving: Slow processing,Decreased initiation,Requires verbal cues,Requires tactile cues General Comments: Pt continues to present with lethargy adn requesting to go back to sleep. Pt following simple commands ~75% of time. Pt  continues to present with aphasia but able to verbalize more words together this session. Difficult to assess due to: Impaired communication,Level  of arousal   Blood pressure 112/77, pulse (!) 111, temperature 98.7 F (37.1 C), resp. rate 16, height 5\' 8"  (1.727 m), weight 72.6 kg, SpO2 100 %. Physical Exam Constitutional:      Appearance: She is normal weight.  HENT:     Head: Normocephalic and atraumatic.  Eyes:     Conjunctiva/sclera: Conjunctivae normal.     Pupils: Pupils are equal, round, and reactive to light.  Cardiovascular:     Rate and Rhythm: Normal rate and regular rhythm.     Heart sounds: Normal heart sounds.  Pulmonary:     Effort: Pulmonary effort is normal. No respiratory distress.     Breath sounds: Normal breath sounds.  Abdominal:     General: Abdomen is flat. Bowel sounds are normal. There is no distension.  Neurological:     Mental Status: She is alert.     Comments: Patient is alert no acute distress.  Globally aphasic.  She does provide some short appropriate phrases.  Follows commands  Difficulty following commands for MMT Grossly 4/5 RUE and RLE 5/5 LUE and LLE  Sensation- difficult to assess due to aphasia but appears to have absent LT in LUE and LLE    Psychiatric:        Attention and Perception: Attention normal.        Mood and Affect: Mood normal.        Speech: Speech is delayed.     Comments: Difficulty in assessing cognition due to aphasia    Inconsistent with simple commands, even with gestural cues Difficulty with naming less common objects, can name simple objects but with phonemic errors     Lab Results Last 24 Hours  Results for orders placed or performed during the hospital encounter of 10/17/20 (from the past 24 hour(s))  Glucose, capillary     Status: Abnormal    Collection Time: 10/26/20  3:44 PM  Result Value Ref Range    Glucose-Capillary 115 (H) 70 - 99 mg/dL  Glucose, capillary     Status: Abnormal    Collection Time:  10/26/20  8:18 PM  Result Value Ref Range    Glucose-Capillary 127 (H) 70 - 99 mg/dL  Glucose, capillary     Status: Abnormal    Collection Time: 10/26/20 11:51 PM  Result Value Ref Range    Glucose-Capillary 105 (H) 70 - 99 mg/dL  Glucose, capillary     Status: Abnormal    Collection Time: 10/27/20  3:47 AM  Result Value Ref Range    Glucose-Capillary 105 (H) 70 - 99 mg/dL  Magnesium     Status: None    Collection Time: 10/27/20  6:00 AM  Result Value Ref Range    Magnesium 2.3 1.7 - 2.4 mg/dL  Comprehensive metabolic panel     Status: Abnormal    Collection Time: 10/27/20  6:00 AM  Result Value Ref Range    Sodium 138 135 - 145 mmol/L    Potassium 4.6 3.5 - 5.1 mmol/L    Chloride 98 98 - 111 mmol/L    CO2 29 22 - 32 mmol/L    Glucose, Bld 103 (H) 70 - 99 mg/dL    BUN 12 6 - 20 mg/dL    Creatinine, Ser 10/29/20 0.44 - 1.00 mg/dL    Calcium 7.35 8.9 - 32.9 mg/dL    Total Protein 7.9 6.5 - 8.1 g/dL    Albumin 3.7 3.5 - 5.0 g/dL    AST 45 (H) 15 - 41  U/L    ALT 111 (H) 0 - 44 U/L    Alkaline Phosphatase 85 38 - 126 U/L    Total Bilirubin 0.6 0.3 - 1.2 mg/dL    GFR, Estimated >16>60 >10>60 mL/min    Anion gap 11 5 - 15  CBC with Differential/Platelet     Status: Abnormal    Collection Time: 10/27/20  6:00 AM  Result Value Ref Range    WBC 8.7 4.0 - 10.5 K/uL    RBC 3.16 (L) 3.87 - 5.11 MIL/uL    Hemoglobin 9.7 (L) 12.0 - 15.0 g/dL    HCT 96.029.0 (L) 45.436.0 - 46.0 %    MCV 91.8 80.0 - 100.0 fL    MCH 30.7 26.0 - 34.0 pg    MCHC 33.4 30.0 - 36.0 g/dL    RDW 09.812.1 11.911.5 - 14.715.5 %    Platelets 344 150 - 400 K/uL    nRBC 0.0 0.0 - 0.2 %    Neutrophils Relative % 57 %    Neutro Abs 5.1 1.7 - 7.7 K/uL    Lymphocytes Relative 27 %    Lymphs Abs 2.3 0.7 - 4.0 K/uL    Monocytes Relative 13 %    Monocytes Absolute 1.2 (H) 0.1 - 1.0 K/uL    Eosinophils Relative 1 %    Eosinophils Absolute 0.1 0.0 - 0.5 K/uL    Basophils Relative 1 %    Basophils Absolute 0.1 0.0 - 0.1 K/uL    Immature  Granulocytes 1 %    Abs Immature Granulocytes 0.05 0.00 - 0.07 K/uL  Glucose, capillary     Status: None    Collection Time: 10/27/20  7:47 AM  Result Value Ref Range    Glucose-Capillary 98 70 - 99 mg/dL  Glucose, capillary     Status: Abnormal    Collection Time: 10/27/20 12:27 PM  Result Value Ref Range    Glucose-Capillary 114 (H) 70 - 99 mg/dL      Imaging Results (Last 48 hours)  No results found.     Assessment/Plan: Diagnosis: Left Frontoparietal IPH 1. Does the need for close, 24 hr/day medical supervision in concert with the patient's rehab needs make it unreasonable for this patient to be served in a less intensive setting? Yes 2. Co-Morbidities requiring supervision/potential complications: Global aphasia , apraxia, dysphagia, urinary incontinence 3. Due to bladder management, bowel management, safety, skin/wound care, disease management, medication administration, pain management and patient education, does the patient require 24 hr/day rehab nursing? Yes 4. Does the patient require coordinated care of a physician, rehab nurse, therapy disciplines of PT, OT, SLP to address physical and functional deficits in the context of the above medical diagnosis(es)? Yes Addressing deficits in the following areas: balance, endurance, locomotion, strength, transferring, bowel/bladder control, bathing, dressing, feeding, grooming, toileting, cognition, speech, language, swallowing and psychosocial support 5. Can the patient actively participate in an intensive therapy program of at least 3 hrs of therapy per day at least 5 days per week? Yes 6. The potential for patient to make measurable gains while on inpatient rehab is good 7. Anticipated functional outcomes upon discharge from inpatient rehab are supervision  with PT, supervision with OT, supervision with SLP. 8. Estimated rehab length of stay to reach the above functional goals is: 18-21d 9. Anticipated discharge destination:  Home 10. Overall Rehab/Functional Prognosis: good   RECOMMENDATIONS: This patient's condition is appropriate for continued rehabilitative care in the following setting: CIR Patient has agreed to participate in recommended program.  N/A Note that insurance prior authorization may be required for reimbursement for recommended care.   Comment: Cerebral angiography planned hold on CIR until it is completed and pt is stable      Charlton Amor, PA-C 10/27/2020    "I have personally performed a face to face diagnostic evaluation of this patient.  Additionally, I have reviewed and concur with the physician assistant's documentation above." Erick Colace M.D. [facility] Fellow Am Acad of Phys Med and Rehab Diplomate Am Board of Electrodiagnostic Med Fellow Am Board of Interventional Pain

## 2020-10-30 NOTE — Progress Notes (Signed)
Patient was admitted this afternoon at 12:45 PM. Personal belongings, phone and call light within reach, no complaints of pain or requests at this time. Vic Ripper

## 2020-10-30 NOTE — Progress Notes (Signed)
Inpatient Rehabilitation Medication Review by a Pharmacist  A complete drug regimen review was completed for this patient to identify any potential clinically significant medication issues.  Clinically significant medication issues were identified:  no  Check AMION for pharmacist assigned to patient if future medication questions/issues arise during this admission.  Pharmacist comments:    Discharge summary does not include Bethanechol, but begun 4/20 and to continue. Ordered on CIR.   Discharge summary included Flonase BID, but not using just PTA and has not been on while inpatient. Appropriate off.    - Had been on SQ heparin for VTE prophylaxis since 10/29/20 > changed to Lovenox 40 mg SQ q24h on CIR.  Time spent performing this drug regimen review (minutes):  [id number]   Dennie Fetters, [place] 10/30/2020 5:24 PM

## 2020-10-30 NOTE — Discharge Summary (Signed)
Marcello MooresJahayla M Francis ZOX:[id number] DOB: 1995-07-17 DOA: 10/17/2020  PCP: Rocco Paulsbgyn, [place]  Admit date: 10/17/2020  Discharge date: 10/30/2020  Admitted From: Home  Disposition:  CIR   Recommendations for Outpatient Follow-up:   Follow up with PCP in 1-2 weeks  PCP Please obtain BMP/CBC, 2 view CXR in 1week,  (see Discharge instructions)   PCP Please follow up on the following pending results:    Home Health: None   Equipment/Devices: None  Consultations: N.Surg, Neuro, IR Discharge Condition: Stable   CODE STATUS: Full    Diet Recommendation:    Diet Order            DIET SOFT Room service appropriate? Yes; Fluid consistency: Thin  Diet effective now                  Chief Complaint  Patient presents with  . Fall     Brief history of present illness from the day of admission and additional interim summary     Patient is a 25 y.o. female with no PMHx who collapsed at a local gas station-when EMS arrived-she was Ahmed Primaunresponsive-she was intubated in the emergency room-CT head showed a ICH with midline shift-taken emergently to the OR-and subsequently managed in the neuro ICU.  COVID-19 vaccinated status: Not known  Significant Events: 4/15>> collapsed at a local gas station-intubated in ED-found to have ICH  4/15>> craniotomy and hematoma evacuation. 4/18>> extubated 4/22>> [facility]  Significant studies: 4/15>> CT head: 5.6 cm acute left frontoparietal intraparenchymal hemorrhage-extending into the third ventricle.  1 cm left to right midline shift. 4/15>> CT C-spine: No acute cervical spine findings. 4/15>> CTA head: No intracranial aneurysm-no large vessel occlusion 4/15>> CTA neck: No evidence of dissection/aneurysm-large vessels are patent 4/16>> UDS: Positive for  tetrahydrocannabinol 4/16>> CT: Evacuated left hemisphere hematoma-drain in place-resolved intracranial mass-effect 4/17>> MRI brain: No mass/vascular lesion underlying evacuated left parietal hematoma 4/17>> MRV brain: No evidence of venous thrombosis 4/17>> Echo: EF 55-60% 4/18>> bilateral lower extremity Doppler: No DVT. 4/19>> CT head: No recurrent hemorrhage.  Decrease brain swelling/midline shift. 4/28- cerebral angiogram by Dr. Corliss Skainseveshwar, abdominal aneurysm  COVID-19 medications: None  Antibiotics: None  Microbiology data: None  Procedures: 4/15>> craniotomy and hematoma evacuation.                                                                  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  Continues to have RLE>> RLE weakness along with mild expressive and receptive aphasia, she is currently on Keppra activity, now on oral diet and core track has been removed.  She also underwent cerebral angiogram by IR on 10/29/2020 which did not show any AVM, plan is  to repeat the procedure possibly for the next 2 to 3 months.  She is now showing good signs of improvement with minimal right upper extremity weakness and some expressive and receptive aphasia which is also gradually improving.  Case was discussed with Dr. Danielle Dess neurosurgeon with nothing to offer at this time but might see her in the office in the next 1 to 2 months.  She was seen here by neurosurgery, neurology and IR, for now supportive care and continue PT OT at [facility] along with speech follow-up.  Please have neurology evaluate her once in the next 7 to 10 days.  Dysphagia: Speech is following and now tolerating oral diet, core track discontinued on 10/27/2020.  Acute hypoxic respiratory failure: In setting of ICH-extubated on 4/18-on room air.  Asymptomatic bradycardia:  While in the ICU-thought to be due to Precedex.    Resolved.  COVID-19 infection: Asymptomatic-unclear if she has been vaccinated.  Finished her isolation on 10/26/2020.  Acute urinary retention: Foley catheter placed this admission by on 10/29/2020, continue Flomax and intermittently monitor bladder emptying at CIR.  Normocytic anemia: Suspect this is from acute illness-no evidence of blood loss from chart review.  Monitor for now.   Discharge diagnosis     Active Problems:   ICH (intracerebral hemorrhage) (HCC)   Encephalopathy acute    Discharge instructions    Discharge Instructions    Discharge instructions   Complete by: As directed    Follow with Primary MD Obgyn, [place] in 2-3 weeks   Activity: As tolerated with Full fall precautions use walker/cane & assistance as needed  Disposition CIR  Diet: Soft with feeding assistance and aspiration precautions.   Special Instructions: If you have smoked or chewed Tobacco  in the last 2 yrs please stop smoking, stop any regular Alcohol  and or any Recreational drug use.  On your next visit with your primary care physician please Get Medicines reviewed and adjusted.  Please request your Prim.MD to go over all Hospital Tests and Procedure/Radiological results at the follow up, please get all Hospital records sent to your Prim MD by signing hospital release before you go home.  If you experience worsening of your admission symptoms, develop shortness of breath, life threatening emergency, suicidal or homicidal thoughts you must seek medical attention immediately by calling 911 or calling your MD immediately  if symptoms less severe.  You Must read complete instructions/literature along with all the possible adverse reactions/side effects for all the Medicines you take and that have been prescribed to you. Take any new Medicines after you have completely understood and accpet all the possible adverse reactions/side effects.    Increase activity slowly   Complete by: As directed    No wound care   Complete by: As directed       Discharge Medications   Allergies as of 10/30/2020      Reactions   Flagyl [metronidazole]       Medication List    STOP taking these medications   azithromycin 500 MG tablet Commonly known as: ZITHROMAX   Cetirizine HCl 10 MG Caps   clindamycin 2 % vaginal cream Commonly known as: CLEOCIN   clotrimazole 1 % cream Commonly known as: LOTRIMIN   fexofenadine-pseudoephedrine 60-120 MG 12 hr tablet Commonly known as: ALLEGRA-D   fluconazole 150 MG tablet Commonly known as: Diflucan     TAKE these medications   acetaminophen 325 MG tablet Commonly known as: TYLENOL Take 2 tablets (650 mg total) by mouth  every 6 (six) hours as needed for mild pain (or temp > 37.5 C (99.5 F)).   artificial tears Oint ophthalmic ointment Commonly known as: LACRILUBE Place 1 application into both eyes as needed (apply to protect eyes from lash extensions).   docusate sodium 100 MG capsule Commonly known as: COLACE Take 1 capsule (100 mg total) by mouth 2 (two) times daily as needed for mild constipation.   fluticasone 50 MCG/ACT nasal spray Commonly known as: FLONASE Place 2 sprays into both nostrils daily.   levETIRAcetam 500 MG tablet Commonly known as: KEPPRA Take 1 tablet (500 mg total) by mouth 2 (two) times daily.   tamsulosin 0.4 MG Caps capsule Commonly known as: FLOMAX Take 1 capsule (0.4 mg total) by mouth daily. Start taking on: October 31, 2020        Follow-up Information    Julieanne Cotton, MD Follow up in 6 week(s).   Specialties: Interventional Radiology, Radiology Why: Please follow-up with Dr. Corliss Skains with CTA head/neck approximately 6 weeks after discharge. Our office will call you to set up this imaging scan. Contact information: [address] [place] [address] [phone]        Obgyn, Ma Hillock. Schedule an appointment as soon as possible  for a visit in 1 week(s).   Contact information: [address] [place] [address] [phone]        Jola Babinski, MD. Schedule an appointment as soon as possible for a visit in 1 week(s).   Specialty: Anesthesiology Contact information: [address] [place] [address] [phone]        Barnett Abu, MD. Schedule an appointment as soon as possible for a visit in 1 week(s).   Specialty: Neurosurgery Contact information: [address] [place] [address] [phone]               Major procedures and Radiology Reports - PLEASE review detailed and final reports thoroughly  -        CT HEAD WO CONTRAST  Result Date: 10/21/2020 CLINICAL DATA:  Follow-up intracranial hemorrhage EXAM: CT HEAD WITHOUT CONTRAST TECHNIQUE: Contiguous axial images were obtained from the base of the skull through the vertex without intravenous contrast. COMPARISON:  Three days ago FINDINGS: Brain: Evacuated left cerebral hemorrhage with drain in place. No evidence of recurrent hemorrhage. Swelling and pneumocephalus is diminished. No progression of intraventricular clot in the left lateral ventricle. No hydrocephalus. Drain has been retracted so that the tip is in the region of hemorrhagic insult. Vascular: Negative Skull: Craniotomy with scalp swelling.  No interval change. Sinuses/Orbits: Mild generalized mucosal thickening. IMPRESSION: 1. No recurrent hemorrhage. Decreased brain swelling and midline shift. 2. No hydrocephalus. Electronically Signed   By: Marnee Spring M.D.   On: 10/21/2020 06:53   CT HEAD WO CONTRAST  Result Date: 10/18/2020 CLINICAL DATA:  25 year old female code stroke presentation with large left hemisphere hemorrhage, intraventricular extension. Posturing on presentation. Postoperative day 1 status post craniotomy and evacuation of intracranial hematoma. EXAM: CT HEAD WITHOUT CONTRAST TECHNIQUE: Contiguous axial images were obtained from the  base of the skull through the vertex without intravenous contrast. COMPARISON:  CT head, CTA head and neck 10/17/2020. FINDINGS: Brain: Small volume left side pneumocephalus. Vertex approach drainage catheter tracking to the left corona radiata and internal capsule. Substantial interval drainage of intra-axial hemorrhage previously centered at the posterior left middle frontal gyrus, anterior parietal lobe. Small volume residual intra-axial blood, in an area of about 3.4 cm along the  course of the drainage catheter (series 3, image 28). Largely resolved regional mass effect. Largely resolved rightward midline shift. Trace extra-axial blood underlying the left craniotomy. Moderate volume left lateral intraventricular hemorrhage is stable. Small volume blood both the 3rd and 4th ventricles, more apparent. Right lateral ventricle appears spared. No ventriculomegaly. Improved basilar cisterns. Partial suprasellar cistern effacement persists. No uncal herniation. No superimposed acute cortically based infarct identified. Vascular: No suspicious intracranial vascular hyperdensity. Skull: Left superior convexity craniotomy. Otherwise stable and intact. Sinuses/Orbits: Right nasoenteric tube remains in place. Fluid in the visible pharynx. Mild new paranasal sinus fluid and mucosal thickening. Tympanic cavities and mastoids remain clear. Other: Postoperative left scalp hematoma, superimposed small volume left scalp fluid and gas collection. Overlying skin staples. Orbits soft tissues remain negative. IMPRESSION: 1. Significantly evacuated left hemisphere hemorrhage by surgery. Percutaneous drain remains in place tracking toward the left internal capsule. Small volume (roughly 3.4 cm diameter) residual intra-axial blood, fluid, and gas. 2. Largely resolved intracranial mass effect and midline shift. Residual partially effaced suprasellar cistern. 3. Stable to minimally increased intraventricular hemorrhage. No  ventriculomegaly. 4. Trace extra-axial blood underlying the craniotomy. Small volume left-side pneumocephalus. Postoperative scalp hematoma. 5. No new intracranial abnormality identified. Electronically Signed   By: Odessa Fleming M.D.   On: 10/18/2020 05:54   CT HEAD WO CONTRAST  Result Date: 10/17/2020 CLINICAL DATA:  Patient is unresponsive and with abnormal posture EXAM: CT HEAD WITHOUT CONTRAST CT CERVICAL SPINE WITHOUT CONTRAST TECHNIQUE: Multidetector CT imaging of the head and cervical spine was performed following the standard protocol without intravenous contrast. Multiplanar CT image reconstructions of the cervical spine were also generated. COMPARISON:  None. FINDINGS: CT HEAD FINDINGS Brain: 5.6 by 3.8 by 4.4 cm (volume = 49 cm^3) acute left frontoparietal intraparenchymal hematoma is noted. This is decompressed into the left lateral ventricle which likewise contains a substantial amount of acute blood products which also extend into the third ventricle. There is approximately 1.0 cm of left-to-right midline shift. Borderline accentuated density along the tentorium and falx but overall I am skeptical that this represents subarachnoid hemorrhage. Effacement of the basilar cisterns including the suprasellar cistern and ambient cisterns. Vascular: Unremarkable Skull: Unremarkable Sinuses/Orbits: Mild chronic right maxillary sinusitis. CT CERVICAL SPINE FINDINGS Alignment: Reversal the normal cervical lordosis but no subluxation. Skull base and vertebrae: No fracture or acute bony findings. Soft tissues and spinal canal: Unremarkable Disc levels:  No bony impingement. Upper chest: Unremarkable Other: Endotracheal and nasogastric tubes noted. IMPRESSION: 1. 5.6 cm in long axis acute left frontoparietal intraparenchymal hematoma, decompressing into the left lateral ventricle which contains a substantial amount of acute blood products which extend into the third ventricle. 1.0 cm of left-to-right midline shift  with effacement of the basilar cisterns. 2. No acute cervical spine findings. 3. Chronic right maxillary sinusitis. Critical Value/emergent results were called by telephone at the time of interpretation on 10/17/2020 at 6:10 pm to provider Dr. Margarita Grizzle , who verbally acknowledged these results. Electronically Signed   By: Gaylyn Rong M.D.   On: 10/17/2020 18:19   CT Cervical Spine Wo Contrast  Result Date: 10/17/2020 CLINICAL DATA:  Patient is unresponsive and with abnormal posture EXAM: CT HEAD WITHOUT CONTRAST CT CERVICAL SPINE WITHOUT CONTRAST TECHNIQUE: Multidetector CT imaging of the head and cervical spine was performed following the standard protocol without intravenous contrast. Multiplanar CT image reconstructions of the cervical spine were also generated. COMPARISON:  None. FINDINGS: CT HEAD FINDINGS Brain: 5.6 by 3.8 by 4.4 cm (  volume = 49 cm^3) acute left frontoparietal intraparenchymal hematoma is noted. This is decompressed into the left lateral ventricle which likewise contains a substantial amount of acute blood products which also extend into the third ventricle. There is approximately 1.0 cm of left-to-right midline shift. Borderline accentuated density along the tentorium and falx but overall I am skeptical that this represents subarachnoid hemorrhage. Effacement of the basilar cisterns including the suprasellar cistern and ambient cisterns. Vascular: Unremarkable Skull: Unremarkable Sinuses/Orbits: Mild chronic right maxillary sinusitis. CT CERVICAL SPINE FINDINGS Alignment: Reversal the normal cervical lordosis but no subluxation. Skull base and vertebrae: No fracture or acute bony findings. Soft tissues and spinal canal: Unremarkable Disc levels:  No bony impingement. Upper chest: Unremarkable Other: Endotracheal and nasogastric tubes noted. IMPRESSION: 1. 5.6 cm in long axis acute left frontoparietal intraparenchymal hematoma, decompressing into the left lateral ventricle which  contains a substantial amount of acute blood products which extend into the third ventricle. 1.0 cm of left-to-right midline shift with effacement of the basilar cisterns. 2. No acute cervical spine findings. 3. Chronic right maxillary sinusitis. Critical Value/emergent results were called by telephone at the time of interpretation on 10/17/2020 at 6:10 pm to provider Dr. Margarita Grizzle , who verbally acknowledged these results. Electronically Signed   By: Gaylyn Rong M.D.   On: 10/17/2020 18:19   MR BRAIN W WO CONTRAST  Result Date: 10/19/2020 CLINICAL DATA:  Cerebral hemorrhage suspected EXAM: MRI HEAD WITHOUT AND WITH CONTRAST MRA HEAD WITHOUT AND WITH CONTRAST TECHNIQUE: Multiplanar, multiecho pulse sequences of the brain and surrounding structures were obtained without and with intravenous contrast. Angiographic images of the head were obtained using MRV technique without and with contrast. CONTRAST:  7.41mL GADAVIST GADOBUTROL 1 MMOL/ML IV SOLN COMPARISON:  Head CT from yesterday. CTA of the head neck from 2 days ago FINDINGS: MRI HEAD FINDINGS Brain: Evacuated left parietal hematoma with expected remaining fluid and blood products. Pneumocephalus has significantly decreased. Midline shift is resolved although there is still signs of elevated intracranial pressure with diffuse effacement of CSF spaces. Intraventricular extension of blood clot. Left-sided drain which traverses the hematoma, tip near the left cerebral peduncle. The catheter does not directly communicate with the ventricular system which is nondilated. No masslike enhancement is seen adjacent to the hemorrhage. There is cortical restricted diffusion about the operative cavity. Accentuated vessels along the superficial aspect of the hematoma cavity, usually reactive. Vascular: As above.  Enhancing dural venous sinuses. Skull and upper cervical spine: Unremarkable craniotomy. Sinuses/Orbits: Negative MRV HEAD FINDINGS No dural venous sinus  thrombosis is seen. Narrow right transverse and sigmoid sinuses attributed to developmental variation. No evidence of cortical vein thrombosis or shunting. IMPRESSION: Brain MRI: 1. No mass or definite vascular lesion underlying the evacuated left parietal hematoma. There is accentuated intravascular enhancement along the superficial cavity which may be reactive; catheter angiography would be contributory. 2. Resolved midline shift. No hydrocephalus. MRV: No evidence for venous thrombosis. Electronically Signed   By: Marnee Spring M.D.   On: 10/19/2020 05:50   DG Chest Portable 1 View  Result Date: 10/17/2020 CLINICAL DATA:  ET tube check EXAM: PORTABLE CHEST 1 VIEW COMPARISON:  Chest radiograph 10/18/2018 at 5:52 p.m. FINDINGS: Interval retraction of the endotracheal tube with tip approximately 1.5 cm above the carina. Enteric catheter in place. Stable cardiomediastinal contours. Low lung volumes but otherwise clear. No pneumothorax or pleural effusion. No acute finding in the visualized skeleton. IMPRESSION: Interval retraction of the endotracheal tube with tip approximately 1.5  cm above the carina. Electronically Signed   By: Emmaline Kluver M.D.   On: 10/17/2020 19:06   DG Chest Port 1 View  Result Date: 10/17/2020 CLINICAL DATA:  Unresponsive. EXAM: PORTABLE CHEST 1 VIEW COMPARISON:  None. FINDINGS: Single frontal view of the chest demonstrates endotracheal tube overlying tracheal air column, tip overlying the right mainstem bronchus. Recommend retracting endotracheal tube approximately 4 cm to ensure adequate placement above the carina. Enteric catheter passes below diaphragm tip and side port projecting over gastric fundus. Cardiac silhouette is unremarkable. No airspace disease, effusion, or pneumothorax. There are no acute bony abnormalities. IMPRESSION: 1. Right mainstem intubation. Recommend retracting endotracheal tube approximately 4 cm to ensure adequate placement above carina. 2. No acute  intrathoracic process. Critical Value/emergent results were called by telephone at the time of interpretation on 10/17/2020 at 6:09 pm to provider [facility] RAY , who verbally acknowledged these results. Electronically Signed   By: Sharlet Salina M.D.   On: 10/17/2020 18:09   DG Abd Portable 1V  Result Date: 10/18/2020 CLINICAL DATA:  Status post nasogastric tube placement EXAM: PORTABLE ABDOMEN - 1 VIEW COMPARISON:  None. FINDINGS: Nasogastric tube appears adequately positioned in the stomach with tip directed towards the stomach fundus. IMPRESSION: Nasogastric tube adequately positioned in the stomach. Electronically Signed   By: Bary Richard M.D.   On: 10/18/2020 12:36   MR MRV HEAD W WO CONTRAST  Result Date: 10/19/2020 CLINICAL DATA:  Cerebral hemorrhage suspected EXAM: MRI HEAD WITHOUT AND WITH CONTRAST MRA HEAD WITHOUT AND WITH CONTRAST TECHNIQUE: Multiplanar, multiecho pulse sequences of the brain and surrounding structures were obtained without and with intravenous contrast. Angiographic images of the head were obtained using MRV technique without and with contrast. CONTRAST:  7.65mL GADAVIST GADOBUTROL 1 MMOL/ML IV SOLN COMPARISON:  Head CT from yesterday. CTA of the head neck from 2 days ago FINDINGS: MRI HEAD FINDINGS Brain: Evacuated left parietal hematoma with expected remaining fluid and blood products. Pneumocephalus has significantly decreased. Midline shift is resolved although there is still signs of elevated intracranial pressure with diffuse effacement of CSF spaces. Intraventricular extension of blood clot. Left-sided drain which traverses the hematoma, tip near the left cerebral peduncle. The catheter does not directly communicate with the ventricular system which is nondilated. No masslike enhancement is seen adjacent to the hemorrhage. There is cortical restricted diffusion about the operative cavity. Accentuated vessels along the superficial aspect of the hematoma cavity, usually  reactive. Vascular: As above.  Enhancing dural venous sinuses. Skull and upper cervical spine: Unremarkable craniotomy. Sinuses/Orbits: Negative MRV HEAD FINDINGS No dural venous sinus thrombosis is seen. Narrow right transverse and sigmoid sinuses attributed to developmental variation. No evidence of cortical vein thrombosis or shunting. IMPRESSION: Brain MRI: 1. No mass or definite vascular lesion underlying the evacuated left parietal hematoma. There is accentuated intravascular enhancement along the superficial cavity which may be reactive; catheter angiography would be contributory. 2. Resolved midline shift. No hydrocephalus. MRV: No evidence for venous thrombosis. Electronically Signed   By: Marnee Spring M.D.   On: 10/19/2020 05:50   ECHOCARDIOGRAM COMPLETE  Result Date: 10/20/2020    ECHOCARDIOGRAM REPORT   Patient Name:   Jane Martin Date of Exam: 10/19/2020 Medical Rec #:  [id number]        Height:       68.0 in Accession #:    [id number]       Weight:       160.0 lb Date of Birth:  1996/02/08  BSA:          1.859 m Patient Age:    24 years         BP:           100/72 mmHg Patient Gender: F                HR:           71 bpm. Exam Location:  Inpatient Procedure: 2D Echo, Cardiac Doppler and Color Doppler Indications:    Stroke I63.9  History:        Patient has no prior history of Echocardiogram examinations.                 Covid 19 positive.  Sonographer:    Roosvelt Maser RDCS Referring Phys: [id number] Barnett Abu IMPRESSIONS  1. Left ventricular ejection fraction, by estimation, is 55 to 60%. The left ventricle has normal function. The left ventricle has no regional wall motion abnormalities. Left ventricular diastolic function could not be evaluated.  2. Right ventricular systolic function is normal. The right ventricular size is normal.  3. The mitral valve is normal in structure. No evidence of mitral valve regurgitation.  4. The aortic valve is normal in structure. Aortic valve  regurgitation is not visualized. Comparison(s): No prior Echocardiogram. Conclusion(s)/Recommendation(s): Otherwise normal echocardiogram, with minor abnormalities described in the report. FINDINGS  Left Ventricle: Left ventricular ejection fraction, by estimation, is 55 to 60%. The left ventricle has normal function. The left ventricle has no regional wall motion abnormalities. The left ventricular internal cavity size was normal in size. There is  no left ventricular hypertrophy. Left ventricular diastolic function could not be evaluated. Right Ventricle: The right ventricular size is normal. No increase in right ventricular wall thickness. Right ventricular systolic function is normal. Left Atrium: Left atrial size was normal in size. Right Atrium: Right atrial size was normal in size. Pericardium: There is no evidence of pericardial effusion. Mitral Valve: The mitral valve is normal in structure. No evidence of mitral valve regurgitation. Tricuspid Valve: The tricuspid valve is normal in structure. Tricuspid valve regurgitation is not demonstrated. Aortic Valve: The aortic valve is normal in structure. Aortic valve regurgitation is not visualized. Pulmonic Valve: The pulmonic valve was not assessed. Pulmonic valve regurgitation is not visualized. Aorta: The aortic root is normal in size and structure. IAS/Shunts: The atrial septum is grossly normal. Riley Lam MD Electronically signed by Riley Lam MD Signature Date/Time: 10/20/2020/12:04:00 PM    Final    VAS [place] LOWER EXTREMITY VENOUS (DVT)  Result Date: 10/21/2020  Lower Venous DVT Study Indications: Stroke.  Risk Factors: ICH, Covid+. Comparison Study: No previous exams Performing Technologist: Ernestene Mention  Examination Guidelines: A complete evaluation includes B-mode imaging, spectral Doppler, color Doppler, and power Doppler as needed of all accessible portions of each vessel. Bilateral testing is considered an integral part of a  complete examination. Limited examinations for reoccurring indications may be performed as noted. The reflux portion of the exam is performed with the patient in reverse Trendelenburg.  +---------+---------------+---------+-----------+----------+--------------+ RIGHT    CompressibilityPhasicitySpontaneityPropertiesThrombus Aging +---------+---------------+---------+-----------+----------+--------------+ CFV      Full           Yes      Yes                                 +---------+---------------+---------+-----------+----------+--------------+ SFJ      Full                                                        +---------+---------------+---------+-----------+----------+--------------+  FV Prox  Full           Yes      Yes                                 +---------+---------------+---------+-----------+----------+--------------+ FV Mid   Full           Yes      Yes                                 +---------+---------------+---------+-----------+----------+--------------+ FV DistalFull           Yes      Yes                                 +---------+---------------+---------+-----------+----------+--------------+ PFV      Full                                                        +---------+---------------+---------+-----------+----------+--------------+ POP      Full           Yes      Yes                                 +---------+---------------+---------+-----------+----------+--------------+ PTV      Full                                                        +---------+---------------+---------+-----------+----------+--------------+ PERO     Full                                                        +---------+---------------+---------+-----------+----------+--------------+   +---------+---------------+---------+-----------+----------+--------------+ LEFT     CompressibilityPhasicitySpontaneityPropertiesThrombus Aging  +---------+---------------+---------+-----------+----------+--------------+ CFV      Full           Yes      Yes                                 +---------+---------------+---------+-----------+----------+--------------+ SFJ      Full                                                        +---------+---------------+---------+-----------+----------+--------------+ FV Prox  Full           Yes      Yes                                 +---------+---------------+---------+-----------+----------+--------------+ FV Mid   Full  Yes      Yes                                 +---------+---------------+---------+-----------+----------+--------------+ FV DistalFull           Yes      Yes                                 +---------+---------------+---------+-----------+----------+--------------+ PFV      Full                                                        +---------+---------------+---------+-----------+----------+--------------+ POP      Full           Yes      Yes                                 +---------+---------------+---------+-----------+----------+--------------+ PTV      Full                                                        +---------+---------------+---------+-----------+----------+--------------+ PERO     Full                                                        +---------+---------------+---------+-----------+----------+--------------+     Summary: BILATERAL: - No evidence of deep vein thrombosis seen in the lower extremities, bilaterally. - No evidence of superficial venous thrombosis in the lower extremities, bilaterally. -No evidence of popliteal cyst, bilaterally.   *See table(s) above for measurements and observations. Electronically signed by Gretta Began MD on 10/21/2020 at 5:10:53 PM.    Final    [place] EKG SITE RITE  Result Date: 10/19/2020 If Site Rite image not attached, placement could not be confirmed due to current  cardiac rhythm.  CT ANGIO HEAD NECK W WO CM (CODE STROKE)  Result Date: 10/17/2020 CLINICAL DATA:  Patient unresponsive with abnormal posture. EXAM: CT HEAD WITHOUT CONTRAST CT ANGIOGRAPHY HEAD AND NECK TECHNIQUE: Multidetector CT imaging of the head and neck was performed using the standard protocol during bolus administration of intravenous contrast. Multiplanar CT image reconstructions and MIPs were obtained to evaluate the vascular anatomy. Carotid stenosis measurements (when applicable) are obtained utilizing NASCET criteria, using the distal internal carotid diameter as the denominator. CONTRAST:  75mL OMNIPAQUE IOHEXOL 350 MG/ML SOLN COMPARISON:  CT head/cervical spine performed earlier today 10/17/2020. FINDINGS: CTA NECK FINDINGS Aortic arch: Standard aortic branching. The visualized aortic arch is normal in caliber. No hemodynamically significant innominate or proximal subclavian artery stenosis. Right carotid system: CCA and ICA patent within the neck without stenosis. Left carotid system: CCA and ICA patent within the neck without stenosis. Vertebral arteries: The intracranial vertebral arteries are patent. Skeleton: No acute bony abnormality or aggressive osseous lesion. Other neck: No neck mass  or cervical lymphadenopathy. Upper chest: No consolidation within the imaged lung apices. Partially imaged ET tube extending into the right mainstem bronchus. Partially imaged enteric tube. Review of the MIP images confirms the above findings CTA HEAD FINDINGS Anterior circulation: The intracranial internal carotid arteries are patent. The M1 middle cerebral arteries are patent. No M2 proximal branch occlusion or high-grade proximal stenosis is identified. The anterior cerebral arteries are patent. No intracranial aneurysm is identified. No evidence of AVM. No contrast blush at the site of the patient's left cerebral hemisphere acute parenchymal hematoma. Posterior circulation: The intracranial vertebral  arteries are patent. The basilar artery is patent. The posterior cerebral arteries are patent. Venous sinuses: The dural venous sinuses are poorly assessed due to contrast bolus timing. Anatomic variants: None significant Review of the MIP images confirms the above findings Findings of right mainstem bronchus intubation called by telephone at the time of interpretation on 10/17/2020 at 6:31 pm to provider [facility] RAY , who verbally acknowledged these results. IMPRESSION: CTA neck: 1. The common carotid, internal carotid and vertebral arteries are patent within the neck without stenosis. No evidence of dissection or aneurysm. 2. Right mainstem intubation. CTA head: 1. No intracranial aneurysm or AVM is identified. However, a contrast-enhanced brain MRI and MR angiography should be considered once the hematoma involute to further evaluate for any underlying lesion. 2. No contrast blush in the region of the left cerebral hemisphere acute parenchymal hematoma. 3. No intracranial large vessel occlusion or proximal high-grade arterial stenosis. Electronically Signed   By: Jackey Loge DO   On: 10/17/2020 18:39    Micro Results     No results found for this or any previous visit (from the past 240 hour(s)).  Today   Subjective    [name] today has no headache,no chest abdominal pain,no new weakness tingling or numbness, feels much better     Objective   Blood pressure 114/77, pulse 88, temperature 98 F (36.7 C), resp. rate 18, height  (1.727 m), weight 72.6 kg, SpO2 100 %.   Intake/Output Summary (Last 24 hours) at 10/30/2020 1007 Last data filed at 10/29/2020 1800 Gross per 24 hour  Intake 420 ml  Output 200 ml  Net 220 ml    Exam  Awake Alert, mild right arm weakness , mild expressive and receptive aphasia [place].AT,PERRAL Supple Neck,No JVD, No cervical lymphadenopathy appriciated.  Symmetrical Chest wall movement, Good air movement bilaterally, CTAB RRR,No Gallops,Rubs or new  Murmurs, No Parasternal Heave +ve B.Sounds, Abd Soft, Non tender, No organomegaly appriciated, No rebound -guarding or rigidity. No Cyanosis, Clubbing or edema, No new Rash or bruise   Data Review   CBC w Diff:  Lab Results  Component Value Date   WBC 8.6 10/30/2020   HGB 9.1 (L) 10/30/2020   HCT 27.8 (L) 10/30/2020   PLT 399 10/30/2020   LYMPHOPCT 30 10/30/2020   MONOPCT 11 10/30/2020   EOSPCT 2 10/30/2020   BASOPCT 1 10/30/2020    CMP:  Lab Results  Component Value Date   NA 137 10/30/2020   K 3.9 10/30/2020   CL 101 10/30/2020   CO2 27 10/30/2020   BUN 11 10/30/2020   CREATININE 0.69 10/30/2020   PROT 6.9 10/30/2020   ALBUMIN 3.4 (L) 10/30/2020   BILITOT 0.4 10/30/2020   ALKPHOS 86 10/30/2020   AST 41 10/30/2020   ALT 97 (H) 10/30/2020  .   Total Time in preparing paper work, data evaluation and todays exam - 35 minutes  Susa Raring M.D on 10/30/2020 at 10:07 AM  [facility]

## 2020-10-30 NOTE — Discharge Instructions (Signed)
Follow with Primary MD Obgyn, Ma Hillock in 2-3 weeks   Activity: As tolerated with Full fall precautions use walker/cane & assistance as needed  Disposition CIR  Diet: Soft with feeding assistance and aspiration precautions.   Special Instructions: If you have smoked or chewed Tobacco  in the last 2 yrs please stop smoking, stop any regular Alcohol  and or any Recreational drug use.  On your next visit with your primary care physician please Get Medicines reviewed and adjusted.  Please request your Prim.MD to go over all Hospital Tests and Procedure/Radiological results at the follow up, please get all Hospital records sent to your Prim MD by signing hospital release before you go home.  If you experience worsening of your admission symptoms, develop shortness of breath, life threatening emergency, suicidal or homicidal thoughts you must seek medical attention immediately by calling 911 or calling your MD immediately  if symptoms less severe.  You Must read complete instructions/literature along with all the possible adverse reactions/side effects for all the Medicines you take and that have been prescribed to you. Take any new Medicines after you have completely understood and accpet all the possible adverse reactions/side effects.

## 2020-10-30 NOTE — TOC Transition Note (Signed)
Transition of Care [facility]) - CM/SW Discharge Note   Patient Details  Name: Jane Martin MRN: [id number] Date of Birth: 1995-12-05  [facility]) CM/SW Contact:  Kermit Balo, RN Phone Number: 10/30/2020, 10:05 AM   Clinical Narrative:    Patient is discharging to CIR today. CM signing off.   Final next level of care: IP Rehab Facility Barriers to Discharge: No Barriers Identified   Patient Goals and CMS Choice        Discharge Placement                       Discharge Plan and Services                                     Social Determinants of Health (SDOH) Interventions     Readmission Risk Interventions No flowsheet data found.

## 2020-10-31 DIAGNOSIS — I611 Nontraumatic intracerebral hemorrhage in hemisphere, cortical: Secondary | ICD-10-CM

## 2020-10-31 LAB — CBC WITH DIFFERENTIAL/PLATELET
Abs Immature Granulocytes: 0.03 10*3/uL (ref 0.00–0.07)
Basophils Absolute: 0 10*3/uL (ref 0.0–0.1)
Basophils Relative: 1 %
Eosinophils Absolute: 0.1 10*3/uL (ref 0.0–0.5)
Eosinophils Relative: 1 %
HCT: 27.5 % — ABNORMAL LOW (ref 36.0–46.0)
Hemoglobin: 9 g/dL — ABNORMAL LOW (ref 12.0–15.0)
Immature Granulocytes: 0 %
Lymphocytes Relative: 29 %
Lymphs Abs: 2 10*3/uL (ref 0.7–4.0)
MCH: 30.1 pg (ref 26.0–34.0)
MCHC: 32.7 g/dL (ref 30.0–36.0)
MCV: 92 fL (ref 80.0–100.0)
Monocytes Absolute: 0.7 10*3/uL (ref 0.1–1.0)
Monocytes Relative: 9 %
Neutro Abs: 4.1 10*3/uL (ref 1.7–7.7)
Neutrophils Relative %: 60 %
Platelets: 446 10*3/uL — ABNORMAL HIGH (ref 150–400)
RBC: 2.99 MIL/uL — ABNORMAL LOW (ref 3.87–5.11)
RDW: 12.3 % (ref 11.5–15.5)
WBC: 6.9 10*3/uL (ref 4.0–10.5)
nRBC: 0 % (ref 0.0–0.2)

## 2020-10-31 LAB — COMPREHENSIVE METABOLIC PANEL
ALT: 83 U/L — ABNORMAL HIGH (ref 0–44)
AST: 36 U/L (ref 15–41)
Albumin: 3.4 g/dL — ABNORMAL LOW (ref 3.5–5.0)
Alkaline Phosphatase: 87 U/L (ref 38–126)
Anion gap: 10 (ref 5–15)
BUN: 10 mg/dL (ref 6–20)
CO2: 27 mmol/L (ref 22–32)
Calcium: 9.8 mg/dL (ref 8.9–10.3)
Chloride: 99 mmol/L (ref 98–111)
Creatinine, Ser: 0.67 mg/dL (ref 0.44–1.00)
GFR, Estimated: >60 mL/min (ref >60)
Glucose, Bld: 85 mg/dL (ref 70–99)
Potassium: 4.1 mmol/L (ref 3.5–5.1)
Sodium: 136 mmol/L (ref 135–145)
Total Bilirubin: 0.6 mg/dL (ref 0.3–1.2)
Total Protein: 7.1 g/dL (ref 6.5–8.1)

## 2020-10-31 NOTE — Evaluation (Signed)
Occupational Therapy Assessment and Plan  Patient Details  Name: Jane Martin MRN: [id number] Date of Birth: Nov 22, 1995  OT Diagnosis: cognitive deficits, hemiplegia affecting dominant side, muscle weakness (generalized) and decreased dynamic standing balance, expressive/receptive language deficits , decreased R [place], decreased BUE/BLE coordination impairing ADL/IADL/func mobility performance Rehab Potential: Rehab Potential (ACUTE ONLY): Excellent ELOS: 12 to 14 days   Today's Date: 10/31/2020 OT Individual Time: 1010-1103 OT Individual Time Calculation (min): 53 min   + 31 min  Hospital Problem: Principal Problem:   ICH (intracerebral hemorrhage) (HCC) Active Problems:   Aphasia   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 No pertinent past medical history. Past Surgical History:  Past Surgical History:  Procedure Laterality Date  . CRANIOTOMY Left 10/17/2020   Procedure: CRANIOTOMY HEMATOMA Evacuation;  Surgeon: Kristeen Miss, MD;  Location: [facility];  Service: Neurosurgery;  Laterality: Left;  . IR ANGIO INTRA EXTRACRAN SEL COM CAROTID INNOMINATE BILAT MOD SED  10/29/2020  . IR ANGIO VERTEBRAL SEL VERTEBRAL BILAT MOD SED  10/29/2020  . IR US GUIDE VASC ACCESS RIGHT  10/29/2020    Assessment & Plan Clinical Impression: Patient is a 25 y.o. year old female  who was admitted 10/17/20 after collapsing at a gas station and was unresponsive with fixed dysconjugate gaze, right hemiplegia and agonal breathing. 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 CTA negative for AVM, aneurysm or high grade stenosis.  UDS positive for THC. She was taken to OR emergently for left parietal crani for evacuation of hemorrhage and placement of IVC by Dr. Ellene Route. Hypertonic  saline used to manage edema and serial CT head showed  Pathology showed benign brain parenchyma. 2D echo showed EF 55-60% and no wall abnormality. BLE dopplers were negative for DVT. She was found to be Covid + and placed on isolation.   MRV done for work up and was negative for mass or vascular lesion or venous thrombosis. MRI brain showed evacuated left parietal hematoma with resolution of midline shift, accentuated intravascular enhancement along superficial cavity--question reactive and no hydrocephalus. She was extubated without difficulty on 04/18 and IVC removed 04/19.  CT angiogram done for further work up on 04/27 and was negative for  Aneurysm, shunt, AVM, dissection, DAVF and focal irregularity of angular branch of L-MCA M4 region questioned due to mass effect or spasm. Dr.Deveshwar recommends repeat angiogram in 6 weeks (IR scheduler to call pt).  Neurology feels that bleed due to unknown etiology and recommends SBP goal <160. Asymptomatic bradycardia has resolved, foley placed due to urinary retention and cortak replaced briefly due to poor intake.  Dysphagia has resolved and she  is tolerating regular diet.   Patient with resultant bouts of lethargy, expressive>receptive aphasia, RUE>RLE weakness and right facial droop. Therapy ongoing and CIR recommended due to functional decline.   Patient transferred to CIR on 10/30/2020 .    Patient currently requires CGA to mod A with basic self-care skills and IADL secondary to muscle weakness, decreased cardiorespiratoy endurance, impaired timing and sequencing, unbalanced muscle activation, motor apraxia, ataxia, decreased coordination and decreased motor planning, decreased initiation, decreased attention, decreased awareness, decreased problem solving, decreased safety awareness and delayed processing and decreased standing balance, hemiplegia and decreased balance strategies.  Prior to hospitalization, patient could complete BADL/IADL with independent  .  Patient will benefit from skilled intervention to increase independence with basic self-care skills and increase level of independence with iADL prior to discharge home with care partner.  Anticipate patient will require intermittent supervision and follow up outpatient.  OT - End of Session Activity Tolerance: Tolerates 10 - 20 min activity with multiple rests Endurance Deficit: Yes Endurance Deficit Description: req increased time to complete ADL OT Assessment Rehab Potential (ACUTE ONLY): Excellent OT Patient demonstrates impairments in the following area(s): Balance;Sensory;Skin Integrity;Cognition;Vision;Endurance;Motor;Perception;Safety OT Basic ADL's Functional Problem(s): Eating;Grooming;Dressing;Bathing;Toileting OT Advanced ADL's Functional Problem(s): Simple Meal Preparation OT Transfers Functional Problem(s): Toilet;Tub/Shower OT Additional Impairment(s): Fuctional Use of Upper Extremity OT Plan OT Intensity: Minimum of 1-2 x/day, 45 to 90 minutes OT Frequency: 5 out of 7 days OT Duration/Estimated Length of Stay: 12 to 14 days OT Treatment/Interventions: Balance/vestibular training;Disease mangement/prevention;Neuromuscular re-education;Self Care/advanced ADL retraining;Therapeutic Exercise;UE/LE Strength taining/ROM;Skin care/wound managment;Wheelchair propulsion/positioning;Pain management;DME/adaptive equipment instruction;Cognitive remediation/compensation;Community reintegration;Functional electrical stimulation;Patient/family education;Splinting/orthotics;UE/LE Coordination activities;Visual/perceptual remediation/compensation;Therapeutic Activities;Psychosocial support;Functional mobility training;Discharge planning OT Self Feeding Anticipated Outcome(s): mod I OT Basic Self-Care Anticipated Outcome(s): S OT Toileting Anticipated Outcome(s): S OT Bathroom Transfers Anticipated Outcome(s): S OT Recommendation Patient destination: Home Follow Up Recommendations:  Outpatient OT Equipment Recommended: Tub/shower bench   OT Evaluation Precautions/Restrictions  Precautions Precautions: Fall Precaution Comments: mild RUE hemi, R inattention receptive > expressive aphasia Restrictions Weight Bearing Restrictions: No General Chart Reviewed: Yes Family/Caregiver Present: Yes Vital Signs Therapy Vitals Temp: 98.6 F (37 C) Temp Source: Oral Pulse Rate: 87 Resp: 17 BP: 104/67 Patient Position (if appropriate): Lying Oxygen Therapy SpO2: 100 % O2 Device: Room Air Pain Pain Assessment Pain Scale: 0-10 Pain Score: 0-No pain Home Living/Prior Functioning Home Living Family/patient expects to be discharged to:: Private residence Vision Baseline Vision/History: No visual deficits Patient Visual Report: No change from baseline Vision Assessment?: Vision impaired- to be further tested in functional context Perception  Perception: Impaired Inattention/Neglect: Does not attend to right side of body Praxis Praxis: Impaired Praxis Impairment Details: Motor planning;Initiation;Ideomotor Cognition Overall Cognitive Status: Difficult to assess (2/2 expressive/receptive language deficits) Arousal/Alertness: Awake/alert Orientation Level: Person;Situation;Place Person: Oriented Place: Oriented Situation: Oriented Year: Other (Comment) (req MC of 2 to accurately answer) Month: April (req MC of 2 to accurately answer) Day of Week: Other (Comment) (req MC of 2 to accurately answer) Memory: Impaired Memory Impairment: Decreased recall of new information;Storage deficit;Decreased short term memory Decreased Short Term Memory: Verbal basic;Functional basic Immediate Memory Recall: Sock;Blue;Bed Memory Recall Sock: Not able to recall Memory Recall Blue: Without Cue Memory Recall Bed: With Cue Attention: Sustained Sustained Attention: Appears intact Awareness: Impaired Awareness Impairment: Emergent impairment Problem Solving: Impaired Problem  Solving Impairment: Functional basic Safety/Judgment: Impaired Sensation Sensation Light Touch: Impaired by gross assessment Hot/Cold: Impaired by gross assessment Proprioception: Impaired by gross assessment Stereognosis: Impaired by gross assessment Additional Comments: unable to appreciate LT to RUE/RLE, RUE > RLE hemi Coordination Gross Motor Movements are Fluid and Coordinated: No Fine Motor Movements are Fluid and Coordinated: No Coordination and Movement Description: RUE>RLE hemi, generalized deconditioning Finger Nose Finger Test: unable to complete with R hand Motor  Motor Motor: Hemiplegia Motor - Skilled Clinical Observations: RUE>RLE hemi, generalized deconditioning  Trunk/Postural Assessment  Cervical Assessment Cervical Assessment: Within Functional Limits Thoracic Assessment Thoracic Assessment: Within Functional Limits Lumbar Assessment Lumbar Assessment: Within Functional Limits Postural Control Postural Control: Deficits on evaluation  Balance Balance Balance Assessed: Yes Static Sitting Balance Static Sitting - Balance Support: Feet supported Static Sitting - Level of Assistance: 5: Stand by  assistance Static Sitting - Comment/# of Minutes: S Dynamic Sitting Balance Dynamic Sitting - Balance Support: Feet supported Dynamic Sitting - Level of Assistance: 5: Stand by assistance Dynamic Sitting Balance - Compensations: CGA Dynamic Sitting - Balance Activities: Forward lean/weight shifting;Reaching across midline;Lateral lean/weight shifting;Reaching for objects Static Standing Balance Static Standing - Balance Support: During functional activity;No upper extremity supported Static Standing - Level of Assistance: 5: Stand by assistance Static Standing - Comment/# of Minutes: CGA Dynamic Standing Balance Dynamic Standing - Balance Support: During functional activity Dynamic Standing - Level of Assistance: 5: Stand by assistance Dynamic Standing - Balance  Activities: Lateral lean/weight shifting;Forward lean/weight shifting;Reaching for objects Dynamic Standing - Comments: CGA Extremity/Trunk Assessment RUE Assessment RUE Assessment: Exceptions to [facility] Active Range of Motion (AROM) Comments: ~160 degrees in shoulder flexion General Strength Comments: difficult to assess 2/2 receptive language deficits/difficulty following MMT commands RUE Body System: Neuro Brunstrum levels for arm and hand: Arm;Hand Brunstrum level for arm: Stage III Synergy is performed voluntarily;Stage IV Movement is deviating from synergy Brunstrum level for hand: Stage II Synergy is developing LUE Assessment LUE Assessment: Within Functional Limits  Care Tool Care Tool Self Care Eating   Eating Assist Level: Set up assist    Oral Care    Oral Care Assist Level: Supervision/Verbal cueing    Bathing   Body parts bathed by patient: Right arm;Left arm;Chest;Abdomen;Front perineal area;Buttocks;Right upper leg;Left upper leg;Right lower leg;Left lower leg;Face     Assist Level: Contact Guard/Touching assist    Upper Body Dressing(including orthotics)   What is the patient wearing?: Pull over shirt   Assist Level: Moderate Assistance - Patient 50 - 74%    Lower Body Dressing (excluding footwear)   What is the patient wearing?: Pants;Underwear/pull up Assist for lower body dressing: Moderate Assistance - Patient 50 - 74%    Putting on/Taking off footwear   What is the patient wearing?: Non-skid slipper socks Assist for footwear: Moderate Assistance - Patient 50 - 74%       Care Tool Toileting Toileting activity   Assist for toileting: Contact Guard/Touching assist     Care Tool Bed Mobility Roll left and right activity   Roll left and right assist level: Supervision/Verbal cueing    Sit to lying activity   Sit to lying assist level: Supervision/Verbal cueing    Lying to sitting edge of bed activity   Lying to sitting edge of bed assist level:  Supervision/Verbal cueing     Care Tool Transfers Sit to stand transfer   Sit to stand assist level: Supervision/Verbal cueing    Chair/bed transfer   Chair/bed transfer assist level: Contact Guard/Touching assist     Toilet transfer   Assist Level: Contact Guard/Touching assist     Care Tool Cognition Expression of Ideas and Wants Expression of Ideas and Wants: Some difficulty - exhibits some difficulty with expressing needs and ideas (e.g, some words or finishing thoughts) or speech is not clear   Understanding Verbal and Non-Verbal Content Understanding Verbal and Non-Verbal Content: Usually understands - understands most conversations, but misses some part/intent of message. Requires cues at times to understand   Memory/Recall Ability *first 3 days only Memory/Recall Ability *first 3 days only: Location of own room;Staff names and faces;That he or she is in a hospital/hospital unit;Current season    Refer to Care Plan for Long Term Goals  SHORT TERM GOAL WEEK 1 OT Short Term Goal 1 (Week 1): Pt will don shirt with min A. OT  Short Term Goal 2 (Week 1): Pt will don pants min A OT Short Term Goal 3 (Week 1): Pt will complete toilet transfer close S. OT Short Term Goal 4 (Week 1): Pt will demonstrate improved attention/management of RUE during functional mobility with min VCs.  Recommendations for other services: None    Skilled Therapeutic Intervention ADL ADL Eating: Set up Where Assessed-Eating: Bed level;Wheelchair Grooming: Supervision/safety Where Assessed-Grooming: Sitting at sink Upper Body Bathing: Contact guard Where Assessed-Upper Body Bathing: Shower Lower Body Bathing: Contact guard Where Assessed-Lower Body Bathing: Shower Upper Body Dressing: Moderate assistance Where Assessed-Upper Body Dressing: Sitting at sink Lower Body Dressing: Moderate assistance Where Assessed-Lower Body Dressing: Sitting at sink Toileting: Contact guard Where Assessed-Toileting:  Glass blower/designer: Therapist, music Method: Arts development officer: Energy manager: Curator Method: Radiographer, therapeutic: Shower seat without back Mobility  Bed Mobility Bed Mobility: Supine to Sit;Sit to Supine Supine to Sit: Supervision/Verbal cueing Sit to Supine: Supervision/Verbal cueing Transfers Sit to Stand: Contact Guard/Touching assist Stand to Sit: Contact Guard/Touching assist  Session Note: Pt received semi-reclined in bed asleep, easily awakened by voice, agreeable to OT. Sister present throughout majority of session. Reviewed role of CIR OT, evaluation process, ADL/func mobility retraining, goals for therapy, and safety plan. Evaluation completed as documented above with session focus on functional mobility, showering, dressing, and grooming. Pt states she would like to get the "most" out of therapy that she can. Pt is able to communicate at sentence level, but req increased time and min VCs for some word finding. Additionally, provided conflicting information on available A upon DC. Per pt, she will have 24/7 S from mom/grandma/sister/niece, however, sister stated this wasn't likely. Pt came to sitting EOB close S + elevated HOB. Stand-pivot throughout session CGA bed >wc>toilet >shower. Pt noted to be slightly impulsive with mobility and decreased insights into deficits. Pt unable to identify touch RUE/RLE and is noted to req freq verbal cues from keeping her extremities in unsafe positions. Pt completed toileting and showering at standing level with above assist levels. Maintained standing balance in shower CGA, but suggested to sit down for energy conservation and bathing BLE. Req mod A to don shirt 2/2 poor spatial awareness of RUE and mod to thread BLE into pants. Completed oral care seated with LUE, but req assist to open toothpaste and poor awareness of spilling water/toothpaste on  self.  Pt left in w/c with PT taking over session. Sister present.   Session 2 [phone]): Pt received semi-reclined in bed with family present, agreeable to therapy. Session focus on RUE NMR in prep for improved bimanual ADL performance. Came to sitting EOB close S, donned B socks mod A to don L sock. Provided w/c in new room. Stand-pivot to w/c with CGA. W/c transport to and from gym 2/2 time management. Completed 1x10 of the following: forward/backward rows, chest press, and shoulder press with 2 lb dowel rod, used ace wrap to facilitate R grasp. Guided in grasp and release of object with R hand, pt overcompensating with assist from L hand despite mod VCs. However, pt is able to reach and grasp object and release at chest height. Pt additionally reqs freq VCs to accurately complete exercises. W/c transport back to room. Stand-pivot back to bed same manner as above, returned to supine close S. Updated NT on rec to cont to do stand-pivots from w/c to toilet for now.   Pt left  semi-reclined in bed with sister present, bed alarm engaged, call bell in reach, and all immediate needs met.    Discharge Criteria: Patient will be discharged from OT if patient refuses treatment 3 consecutive times without medical reason, if treatment goals not met, if there is a change in medical status, if patient makes no progress towards goals or if patient is discharged from hospital.  The above assessment, treatment plan, treatment alternatives and goals were discussed and mutually agreed upon: by patient and by family  Volanda Napoleon MS, OTR/L  10/31/2020, 11:06 AM

## 2020-10-31 NOTE — Progress Notes (Signed)
Patient information reviewed and entered into [facility] by Becky Erikson Danzy, PPS coordinator. Information including medical coding, function ability, and quality indicators will be reviewed and updated through discharge.   

## 2020-10-31 NOTE — Plan of Care (Signed)
Problem: RH Balance Goal: LTG: Patient will maintain dynamic sitting balance (OT) Description: LTG:  Patient will maintain dynamic sitting balance with assistance during activities of daily living (OT) Flowsheets (Taken 10/31/2020 1259) LTG: Pt will maintain dynamic sitting balance during ADLs with: Independent with assistive device Goal: LTG Patient will maintain dynamic standing with ADLs (OT) Description: LTG:  Patient will maintain dynamic standing balance with assist during activities of daily living (OT)  Flowsheets (Taken 10/31/2020 1259) LTG: Pt will maintain dynamic standing balance during ADLs with: Supervision/Verbal cueing   Problem: Sit to Stand Goal: LTG:  Patient will perform sit to stand in prep for activites of daily living with assistance level (OT) Description: LTG:  Patient will perform sit to stand in prep for activites of daily living with assistance level (OT) Flowsheets (Taken 10/31/2020 1259) LTG: PT will perform sit to stand in prep for activites of daily living with assistance level: Independent with assistive device   Problem: RH Eating Goal: LTG Patient will perform eating w/assist, cues/equip (OT) Description: LTG: Patient will perform eating with assist, with/without cues using equipment (OT) Flowsheets (Taken 10/31/2020 1259) LTG: Pt will perform eating with assistance level of: Independent with assistive device    Problem: RH Grooming Goal: LTG Patient will perform grooming w/assist,cues/equip (OT) Description: LTG: Patient will perform grooming with assist, with/without cues using equipment (OT) Flowsheets (Taken 10/31/2020 1259) LTG: Pt will perform grooming with assistance level of: Independent with assistive device    Problem: RH Bathing Goal: LTG Patient will bathe all body parts with assist levels (OT) Description: LTG: Patient will bathe all body parts with assist levels (OT) Flowsheets (Taken 10/31/2020 1259) LTG: Pt will perform bathing with  assistance level/cueing: Supervision/Verbal cueing   Problem: RH Dressing Goal: LTG Patient will perform upper body dressing (OT) Description: LTG Patient will perform upper body dressing with assist, with/without cues (OT). Flowsheets (Taken 10/31/2020 1259) LTG: Pt will perform upper body dressing with assistance level of: Independent with assistive device Goal: LTG Patient will perform lower body dressing w/assist (OT) Description: LTG: Patient will perform lower body dressing with assist, with/without cues in positioning using equipment (OT) Flowsheets (Taken 10/31/2020 1259) LTG: Pt will perform lower body dressing with assistance level of: Supervision/Verbal cueing   Problem: RH Toileting Goal: LTG Patient will perform toileting task (3/3 steps) with assistance level (OT) Description: LTG: Patient will perform toileting task (3/3 steps) with assistance level (OT)  Flowsheets (Taken 10/31/2020 1259) LTG: Pt will perform toileting task (3/3 steps) with assistance level: Independent with assistive device   Problem: RH Functional Use of Upper Extremity Goal: LTG Patient will use RT/LT upper extremity as a (OT) Description: LTG: Patient will use right/left upper extremity as a stabilizer/gross assist/diminished/nondominant/dominant level with assist, with/without cues during functional activity (OT) Flowsheets (Taken 10/31/2020 1259) LTG: Use of upper extremity in functional activities: RUE as diminished level LTG: Pt will use upper extremity in functional activity with assistance level of: Minimal Assistance - Patient > 75%   Problem: RH Simple Meal Prep Goal: LTG Patient will perform simple meal prep w/assist (OT) Description: LTG: Patient will perform simple meal prep with assistance, with/without cues (OT). Flowsheets (Taken 10/31/2020 1259) LTG: Pt will perform simple meal prep with assistance level of: Supervision/Verbal cueing   Problem: RH Toilet Transfers Goal: LTG Patient will  perform toilet transfers w/assist (OT) Description: LTG: Patient will perform toilet transfers with assist, with/without cues using equipment (OT) Flowsheets (Taken 10/31/2020 1259) LTG: Pt will perform toilet transfers with  assistance level of: Supervision/Verbal cueing   Problem: RH Tub/Shower Transfers Goal: LTG Patient will perform tub/shower transfers w/assist (OT) Description: LTG: Patient will perform tub/shower transfers with assist, with/without cues using equipment (OT) Flowsheets (Taken 10/31/2020 1259) LTG: Pt will perform tub/shower stall transfers with assistance level of: Supervision/Verbal cueing

## 2020-10-31 NOTE — Progress Notes (Addendum)
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 EXAM: BILATERAL COMMON CAROTID AND INNOMINATE ANGIOGRAPHY COMPARISON:  Recent CT angiogram of the head and neck, and MRI of the brain. MEDICATIONS: Heparin 2000 units IA. No antibiotic was administered within 1 hour of the procedure. ANESTHESIA/SEDATION: Versed 1 mg IV; Fentanyl 25 mcg IV Moderate Sedation Time:  51 minutes The patient was continuously monitored during the procedure by the interventional radiology nurse under my direct supervision. CONTRAST:  Isovue 300 approximately 75 mL. FLUOROSCOPY TIME:  Fluoroscopy Time: 19 minutes 30 seconds (1498 mGy). COMPLICATIONS: None immediate. TECHNIQUE: Informed written consent was obtained from the patient after a thorough discussion of the procedural risks, benefits and alternatives. All questions were addressed. Maximal Sterile Barrier Technique was utilized including caps, mask, sterile gowns, sterile gloves, sterile drape, hand hygiene and skin antiseptic. A timeout was performed prior to the initiation of the procedure. The right forearm to the wrist was prepped and draped in the usual sterile manner. The right radial artery was identified with ultrasound, and its morphology documented. A dorsal palmar anastomosis was verified to be present. Using ultrasound guidance radial access was obtained over a 0.018 inch micro guidewire. A 4/5 French radial sheath was then inserted without event. The micro guidewire, and the obturator were removed. Good aspiration was obtained from the radial sheath. A cocktail of 2000 units of heparin, 2.5 mg of verapamil, and 200 mcg of nitroglycerin was then infused in diluted form without event. A right radial arteriogram was  performed. Over a 0.035 inch Roadrunner guidewire, a 5 [place] Simmons 2 diagnostic catheter was advanced to the aortic arch region, and cannulations were performed of the right vertebral artery, the right common carotid artery, the left common carotid artery and the left vertebral artery just proximal to its origin. A wrist band was applied at the radial puncture site for hemostasis. Distal right radial pulse was verified to be present. FINDINGS:.: FINDINGS:. The right vertebral origin is widely patent. The vessel is seen to opacify to the cranial skull base. Patency is seen of the right vertebrobasilar junction and the right posterior-inferior cerebellar artery. The basilar artery, the posterior cerebral arteries, the superior cerebellar arteries and the anterior-inferior cerebellar arteries opacify into the capillary and venous phases. Transient opacification of both posterior communicating arteries was noted. The left common carotid arteriogram demonstrates the left external carotid artery and its major branches to be widely patent. The left internal carotid artery at the bulb to the cranial skull base is widely patent. Petrous and proximal cavernous segments are widely patent. There is mild diffuse narrowing of the distal cavernous, and supraclinoid segments of the left internal carotid artery. The left middle cerebral artery and the left anterior cerebral artery opacify into the capillary and venous phases. The distal M4 branches of the angular branch of the inferior division demonstrates focal areas of mild-to-moderate irregularity with intermittent stenosis. No evidence of stagnation, however, is noted. No evidence of arteriovenous shunting, dural AV fistula, arteriovenous malformation, or  aneurysm is seen in the immediate vicinity of the intra cerebral hemorrhage. Venous outflow demonstrates normal antegrade flow into cortical veins and dural venous sinuses into the internal jugular veins bilaterally. The  right common carotid arteriogram demonstrates the right external carotid artery and its major branches to be widely patent. The right internal carotid artery at the bulb to the cranial skull base is widely patent. There is mild tapered narrowing of the distal cavernous right ICA extending into the supraclinoid right ICA. The right middle and the right anterior cerebral artery opacify into the capillary and venous phases. The left vertebral artery origin is widely patent. The vessel opacifies normally to the cranial skull base. Patency is maintained of the left vertebrobasilar junction and the left posterior-inferior cerebellar artery. The basilar artery, the posterior cerebral arteries, the superior cerebellar artery and the anterior-inferior cerebellar arteries opacify into the capillary and venous phases. IMPRESSION: Angiographically no evidence of arteriovenous shunting to suggest arteriovenous malformation or dural AV fistula. No evidence of intracranial aneurysms, or of dissections. Moderate irregularity with stenosis of the M4 branches of the angular branch of the left middle cerebral artery inferior division and/or mass effect related to the intracranial hemorrhage. Mild tapered narrowing of both internal carotid arteries supraclinoid segments may represent vasospasm. PLAN: Findings discussed with patient and the referring MD. Follow-up CT angiogram of the head and neck in approximately 6 weeks time. Electronically Signed   By: Julieanne Cotton M.D.   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 EXAM: BILATERAL COMMON CAROTID AND INNOMINATE ANGIOGRAPHY COMPARISON:  Recent CT angiogram of the head and neck, and MRI of the brain. MEDICATIONS: Heparin 2000 units IA. No antibiotic was administered within 1 hour of the procedure. ANESTHESIA/SEDATION: Versed 1  mg IV; Fentanyl 25 mcg IV Moderate Sedation Time:  51 minutes The patient was continuously monitored during the procedure by the interventional radiology nurse under my direct supervision. CONTRAST:  Isovue 300 approximately 75 mL. FLUOROSCOPY TIME:  Fluoroscopy Time: 19 minutes 30 seconds (1498 mGy). COMPLICATIONS: None immediate. TECHNIQUE: Informed written consent was obtained from the patient after a thorough discussion of the procedural risks, benefits and alternatives. All questions were addressed. Maximal Sterile Barrier Technique was utilized including caps, mask, sterile gowns, sterile gloves, sterile drape, hand hygiene and skin antiseptic. A timeout was performed prior to the initiation of the procedure. The right forearm to the wrist was prepped and draped in the usual sterile manner. The right radial artery was identified with ultrasound, and its morphology documented. A dorsal palmar anastomosis was verified to be present. Using ultrasound guidance radial access was obtained over a 0.018 inch micro guidewire. A 4/5 French radial sheath was then inserted without event. The micro guidewire, and the obturator were removed. Good aspiration was obtained from the radial sheath. A cocktail of 2000 units of heparin, 2.5 mg of verapamil, and 200 mcg of nitroglycerin was then infused in diluted form without event. A right radial arteriogram was performed. Over a 0.035 inch Roadrunner guidewire, a 5 [place] Simmons 2 diagnostic catheter was advanced to the aortic arch region, and cannulations were performed of the right vertebral artery, the right common carotid artery, the left common carotid artery and the left vertebral artery just proximal to its origin. A wrist band was applied at the radial puncture site for hemostasis. Distal right radial pulse was verified to  be present. FINDINGS:.: FINDINGS:. The right vertebral origin is widely patent. The vessel is seen to opacify to the cranial skull base. Patency is  seen of the right vertebrobasilar junction and the right posterior-inferior cerebellar artery. The basilar artery, the posterior cerebral arteries, the superior cerebellar arteries and the anterior-inferior cerebellar arteries opacify into the capillary and venous phases. Transient opacification of both posterior communicating arteries was noted. The left common carotid arteriogram demonstrates the left external carotid artery and its major branches to be widely patent. The left internal carotid artery at the bulb to the cranial skull base is widely patent. Petrous and proximal cavernous segments are widely patent. There is mild diffuse narrowing of the distal cavernous, and supraclinoid segments of the left internal carotid artery. The left middle cerebral artery and the left anterior cerebral artery opacify into the capillary and venous phases. The distal M4 branches of the angular branch of the inferior division demonstrates focal areas of mild-to-moderate irregularity with intermittent stenosis. No evidence of stagnation, however, is noted. No evidence of arteriovenous shunting, dural AV fistula, arteriovenous malformation, or aneurysm is seen in the immediate vicinity of the intra cerebral hemorrhage. Venous outflow demonstrates normal antegrade flow into cortical veins and dural venous sinuses into the internal jugular veins bilaterally. The right common carotid arteriogram demonstrates the right external carotid artery and its major branches to be widely patent. The right internal carotid artery at the bulb to the cranial skull base is widely patent. There is mild tapered narrowing of the distal cavernous right ICA extending into the supraclinoid right ICA. The right middle and the right anterior cerebral artery opacify into the capillary and venous phases. The left vertebral artery origin is widely patent. The vessel opacifies normally to the cranial skull base. Patency is maintained of the left  vertebrobasilar junction and the left posterior-inferior cerebellar artery. The basilar artery, the posterior cerebral arteries, the superior cerebellar artery and the anterior-inferior cerebellar arteries opacify into the capillary and venous phases. IMPRESSION: Angiographically no evidence of arteriovenous shunting to suggest arteriovenous malformation or dural AV fistula. No evidence of intracranial aneurysms, or of dissections. Moderate irregularity with stenosis of the M4 branches of the angular branch of the left middle cerebral artery inferior division and/or mass effect related to the intracranial hemorrhage. Mild tapered narrowing of both internal carotid arteries supraclinoid segments may represent vasospasm. PLAN: Findings discussed with patient and the referring MD. Follow-up CT angiogram of the head and neck in approximately 6 weeks time. Electronically Signed   By: Julieanne CottonSanjeev  Deveshwar M.D.   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 EXAM: BILATERAL COMMON CAROTID AND INNOMINATE ANGIOGRAPHY COMPARISON:  Recent CT angiogram of the head and neck, and MRI of the brain. MEDICATIONS: Heparin 2000 units IA. No antibiotic was administered within 1 hour of the procedure. ANESTHESIA/SEDATION: Versed 1 mg IV; Fentanyl 25 mcg IV Moderate Sedation Time:  51 minutes The patient was continuously monitored during the procedure by the interventional radiology nurse under my direct supervision. CONTRAST:  Isovue 300 approximately 75 mL. FLUOROSCOPY TIME:  Fluoroscopy Time: 19 minutes 30 seconds (1498 mGy). COMPLICATIONS: None immediate. TECHNIQUE: Informed written consent was obtained from the patient after a thorough discussion of the procedural risks, benefits and alternatives. All questions were addressed. Maximal Sterile Barrier Technique was utilized including caps, mask, sterile  gowns, sterile gloves, sterile drape, hand hygiene and skin antiseptic.  A timeout was performed prior to the initiation of the procedure. The right forearm to the wrist was prepped and draped in the usual sterile manner. The right radial artery was identified with ultrasound, and its morphology documented. A dorsal palmar anastomosis was verified to be present. Using ultrasound guidance radial access was obtained over a 0.018 inch micro guidewire. A 4/5 French radial sheath was then inserted without event. The micro guidewire, and the obturator were removed. Good aspiration was obtained from the radial sheath. A cocktail of 2000 units of heparin, 2.5 mg of verapamil, and 200 mcg of nitroglycerin was then infused in diluted form without event. A right radial arteriogram was performed. Over a 0.035 inch Roadrunner guidewire, a 5 [place] Simmons 2 diagnostic catheter was advanced to the aortic arch region, and cannulations were performed of the right vertebral artery, the right common carotid artery, the left common carotid artery and the left vertebral artery just proximal to its origin. A wrist band was applied at the radial puncture site for hemostasis. Distal right radial pulse was verified to be present. FINDINGS:.: FINDINGS:. The right vertebral origin is widely patent. The vessel is seen to opacify to the cranial skull base. Patency is seen of the right vertebrobasilar junction and the right posterior-inferior cerebellar artery. The basilar artery, the posterior cerebral arteries, the superior cerebellar arteries and the anterior-inferior cerebellar arteries opacify into the capillary and venous phases. Transient opacification of both posterior communicating arteries was noted. The left common carotid arteriogram demonstrates the left external carotid artery and its major branches to be widely patent. The left internal carotid artery at the bulb to the cranial skull base is widely patent. Petrous and proximal  cavernous segments are widely patent. There is mild diffuse narrowing of the distal cavernous, and supraclinoid segments of the left internal carotid artery. The left middle cerebral artery and the left anterior cerebral artery opacify into the capillary and venous phases. The distal M4 branches of the angular branch of the inferior division demonstrates focal areas of mild-to-moderate irregularity with intermittent stenosis. No evidence of stagnation, however, is noted. No evidence of arteriovenous shunting, dural AV fistula, arteriovenous malformation, or aneurysm is seen in the immediate vicinity of the intra cerebral hemorrhage. Venous outflow demonstrates normal antegrade flow into cortical veins and dural venous sinuses into the internal jugular veins bilaterally. The right common carotid arteriogram demonstrates the right external carotid artery and its major branches to be widely patent. The right internal carotid artery at the bulb to the cranial skull base is widely patent. There is mild tapered narrowing of the distal cavernous right ICA extending into the supraclinoid right ICA. The right middle and the right anterior cerebral artery opacify into the capillary and venous phases. The left vertebral artery origin is widely patent. The vessel opacifies normally to the cranial skull base. Patency is maintained of the left vertebrobasilar junction and the left posterior-inferior cerebellar artery. The basilar artery, the posterior cerebral arteries, the superior cerebellar artery and the anterior-inferior cerebellar arteries opacify into the capillary and venous phases. IMPRESSION: Angiographically no evidence of arteriovenous shunting to suggest arteriovenous malformation or dural AV fistula. No evidence of intracranial aneurysms, or of dissections. Moderate irregularity with stenosis of the M4 branches of the angular branch of the left middle cerebral artery inferior division and/or mass effect related to  the intracranial hemorrhage. Mild tapered narrowing of both internal carotid arteries supraclinoid segments may represent vasospasm. PLAN: Findings discussed with patient and the referring MD. Follow-up CT  angiogram of the head and neck in approximately 6 weeks time. Electronically Signed   By: Julieanne Cotton M.D.   On: 10/29/2020 11:49   Recent Labs    10/30/20 0355 10/31/20 0506  WBC 8.6 6.9  HGB 9.1* 9.0*  HCT 27.8* 27.5*  PLT 399 446*   Recent Labs    10/29/20 0500 10/30/20 0355  NA 136 137  K 3.9 3.9  CL 96* 101  CO2 30 27  GLUCOSE 94 85  BUN 13 11  CREATININE 0.70 0.69  CALCIUM 10.0 9.5    Intake/Output Summary (Last 24 hours) at 10/31/2020 1610 Last data filed at 10/30/2020 1645 Gross per 24 hour  Intake 100 ml  Output --  Net 100 ml        Physical Exam: Vital Signs Blood pressure 109/76, pulse 71, temperature 98.6 F (37 C), resp. rate 18, SpO2 100 %.   General: No acute distress Mood and affect are appropriate Heart: Regular rate and rhythm no rubs murmurs or extra sounds Lungs: Clear to auscultation, breathing unlabored, no rales or wheezes Abdomen: Positive bowel sounds, soft nontender to palpation, nondistended Extremities: No clubbing, cyanosis, or edema Skin: No evidence of breakdown, no evidence of rash Neurologic: Cranial nerves II through XII intact, motor strength is 5/5 in left and 4/5 RIght  deltoid, bicep, tricep, grip, hip flexor, knee extensors, ankle dorsiflexor and plantar flexor Sensory exam normal difficult to assess due to aphasia- LT sensation apprears to be absent on RIght side  Cerebellar exam normal finger to nose to finger as well as heel to shin in bilateral upper and lower extremities Musculoskeletal: Full range of motion in all 4 extremities. No joint swelling   Assessment/Plan: 1. Functional deficits which require 3+ hours per day of interdisciplinary therapy in a comprehensive inpatient rehab setting.  Physiatrist is  providing close team supervision and 24 hour management of active medical problems listed below.  Physiatrist and rehab team continue to assess barriers to discharge/monitor patient progress toward functional and medical goals  Care Tool:  Bathing              Bathing assist       Upper Body Dressing/Undressing Upper body dressing   What is the patient wearing?: Hospital gown only    Upper body assist Assist Level: Minimal Assistance - Patient > 75%    Lower Body Dressing/Undressing Lower body dressing      What is the patient wearing?: Hospital gown only     Lower body assist       Toileting Toileting    Toileting assist       Transfers Chair/bed transfer  Transfers assist           Locomotion Ambulation   Ambulation assist   Ambulation activity did not occur: N/A          Walk 10 feet activity   Assist           Walk 50 feet activity   Assist           Walk 150 feet activity   Assist           Walk 10 feet on uneven surface  activity   Assist           Wheelchair     Assist               Wheelchair 50 feet with 2 turns activity    Assist  Wheelchair 150 feet activity     Assist          Blood pressure 109/76, pulse 71, temperature 98.6 F (37 C), resp. rate 18, SpO2 100 %.     Medical Problem List and Plan: 1.  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  Antithrombotics: -DVT/anticoagulation:  Pharmaceutical: Lovenox             -antiplatelet therapy: N/a 3. Pain Management: Tylenol prn.  4. Mood: LCSW to follow for evaluation and support.              -antipsychotic agents: N/A 5. Neuropsych: This patient is? capable of making decisions on her own behalf. 6. Skin/Wound Care: Routine pressure relief  measures.  7. Fluids/Electrolytes/Nutrition: Monitor I/O. Check lytes in am. Cortrak removed, eating 50-100% meals  8. Seizure prophylaxis: On Keppra bid. 9. Urinary retention: On flomax and low dose urecholine.             --monitor voiding with PVR checks. Is voiding and hasn't required in/out caths in last 24 hours 10. Acute blood loss anemia:  11. Tachycardia:improved will monitor  Vitals:   10/30/20 2008 10/31/20 0420  BP: 114/64 109/76  Pulse: 80 71  Resp: 17 18  Temp: 98.5 F (36.9 C) 98.6 F (37 C)  SpO2: 100% 100%             12. Significant apraxia- per therapy  LOS: 1 days A FACE TO FACE EVALUATION WAS PERFORMED  Erick Colace 10/31/2020, 6:27 AM

## 2020-10-31 NOTE — Progress Notes (Signed)
Patient transferred to [facility].
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
## 2020-11-01 LAB — DRUG PROFILE, UR, 9 DRUGS (LABCORP)
Amphetamines, Urine: NEGATIVE ng/mL
Barbiturate, Ur: NEGATIVE ng/mL
Benzodiazepine Quant, Ur: NEGATIVE ng/mL
Cannabinoid Quant, Ur: POSITIVE ng/mL — AB
Cocaine (Metab.): NEGATIVE ng/mL
Methadone Screen, Urine: NEGATIVE ng/mL
Opiate Quant, Ur: NEGATIVE ng/mL
Phencyclidine, Ur: NEGATIVE ng/mL
Propoxyphene, Urine: NEGATIVE ng/mL

## 2020-11-01 NOTE — Progress Notes (Signed)
Speech Language Pathology Daily Session Note  Patient Details  Name: Jane Martin MRN: [id number] Date of Birth: May 20, 1996  Today's Date: 11/01/2020 SLP Individual Time: 1035-1100 SLP Individual Time Calculation (min): 25 min  Short Term Goals: Week 1: SLP Short Term Goal 1 (Week 1): Pt will express wants/needs at the sentence level with min A verbal cues for word finding skills. SLP Short Term Goal 2 (Week 1): Pt will name common objects with 70% accuracy given min A semantic, written, phonetic and sentence completion cues. SLP Short Term Goal 3 (Week 1): Pt will write 3-4 letter words with mod A multimodal cues. SLP Short Term Goal 4 (Week 1): Pt will follow 1-2 step functional commands with mod A verbal cues. SLP Short Term Goal 5 (Week 1): Pt self-monitor and self-correct verbal and functional errors with min verbal cues. SLP Short Term Goal 6 (Week 1): Pt will read at word level with 80% accuracy given supervision A written, phonetic and sentence completion cues.  Skilled Therapeutic Interventions:Skilled ST services focused on language skills. SLP facilitated expressive language at phrase/sentence level given noun verb picture cards. Pt demonstrated appropriate phrase/sentence formation/word finding in 3 out 10 opportunities and with max A phonetic cues, x1 written cuesand extra time demonstrated word finding in 10/10 opportunities. This is a reduction in cuing complexity (relaying on visual and written cues) compared to yesterday.  Pt was able to write 8 out 10 letters via dictation when requested in alphabetical order, but dictation of letters not in order pt was able to write 1 out 4 accurately. Pt was left in room with call bell within reach and bed alarm set. SLP recommends to continue skilled services.     Pain Pain Assessment Pain Scale: 0-10 Pain Score: 0-No pain  Therapy/Group: Individual Therapy  Oumar Marcott  [facility] 11/01/2020, 7:49 AM

## 2020-11-01 NOTE — Progress Notes (Addendum)
Physical Therapy Session Note  Patient Details  Name: Jane Martin MRN: [id number] Date of Birth: 06/18/1996  Today's Date: 11/01/2020 PT Individual Time: 0800-0900, 5102-5852 PT Individual Time Calculation (min): 60 min , 43 min  Short Term Goals: Week 1:  PT Short Term Goal 1 (Week 1): Pt will initiate w/c propel and management training. PT Short Term Goal 2 (Week 1): Pt will ambulate 200 feet with improved foot clearance with AFO. PT Short Term Goal 3 (Week 1): pt will perform safe functional standing transfers including use of armrest with CGA/ supervision and reduced need for vc.  Skilled Therapeutic Interventions/Progress Updates:  tx 1:  Pt dozing in bed, but easily awakened.  She denied pain.  She stated that she had not urinated since last night, and did not need to, but PT urged her to sit on the toilet.  Supine> sit independently. Sit> stand with min assist to RW.  Gait with RW x 10'  into BR with min assist, with R hand in splint on RW, placed by PT.  Toilet transfer with min assist.  Pt continent of urine; peri care with set up.  Hand washing at sink in standing, min assist for balance.  Balance retraining via functional activity:  in sitting without LE support: reaching out of BOS R/L to facilitate trunk shortening/lengthening while grasping cones (assistance for R hand). In standing with forefeet on blue wedge x 1 minute without UE support.  When LUE movement added to standing on wedge, pt had LOB requiring max assist to avoid fall.  neuromuscular re-education via demo, multimodal cues for supine/hook lying : bil adductor squeezes, bil adductor squeezes with bil bridging.  Gait training on level tile x 25' with RW, CGA.  Up/down 12 steps, L rail, with mod assist to ascend 6" high steps, self -selected step-to pattern with RLE leading, min assist to descend 6" high steps with step-to pattern, leading with RLE; CGA up/down 3" high steps with step-to pattern.    At end of  session, pt in bed with alarm set and needs at hand.  tx 2:  Pt resting in bed.  She denied pain. Pt donned non slip socks in sitting using R hand, with extra time.  Sit> stand with Contact guard assist.  Gait training on level tile with RW, multiple turns, with Cga/min assist.  Pt has R steppage gait with some drifting to R.   neuromuscular re-education via demo and multimodal cues for isolated movements in standing with bil UE support: R/L hamstring curls, bil heel raises, bil toe raises, mini squats, bil wall push-ups. Seated: bil pronation/supination with counting and max cues to follow R hand visually.    Blocked practice sit> < stand, biased to R, without use of UEs.  With practice, pt had increased loading of RLE. Pt requested using toilet.  Toilet transfer with min assist with RW.  Pt continent of bladder.  Peri care independent.  At end of session, pt resting in bed with alarm set .  Pt's father entered room; he state that pt's sister would be here soon, and she could help with pt bathing.  PT strongly recommended that pt/father wait until Nsg could help pt, due to her balance deficits.  The were receptive.  PT informed Montel Clock, LPN that pt really wanted to bathe, but would not be safe except for bed bath.     Therapy Documentation Precautions:  Precautions Precautions: Fall Precaution Comments: R hemi, R inattention, receptive > expressive aphasia  Restrictions Weight Bearing Restrictions: No          Therapy/Group: Individual Therapy  Sylvester Salonga 11/01/2020, 10:12 AM

## 2020-11-01 NOTE — Evaluation (Signed)
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 No pertinent past medical history. Past Surgical History:  Past Surgical History:  Procedure Laterality Date  . CRANIOTOMY Left 10/17/2020   Procedure: CRANIOTOMY HEMATOMA Evacuation;  Surgeon: Kristeen Miss, MD;  Location: [place];  Service: Neurosurgery;  Laterality: Left;  . IR ANGIO INTRA EXTRACRAN SEL COM CAROTID INNOMINATE BILAT MOD SED  10/29/2020  . IR ANGIO VERTEBRAL SEL VERTEBRAL BILAT MOD SED  10/29/2020  . IR US GUIDE VASC ACCESS RIGHT  10/29/2020    Assessment & Plan Clinical Impression: Patient is a 25 y.o. female who was admitted 10/17/20 after collapsing at a gas station and was unresponsive with fixed dysconjugate gaze, right hemiplegia and agonal breathing. 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 CTA negative for AVM, aneurysm or high grade stenosis.  UDS positive for THC. She was taken to OR emergently for left parietal crani for evacuation of hemorrhage and placement of IVC by Dr. Ellene Route. Hypertonic saline used to manage edema and serial CT head showed  Pathology showed benign brain parenchyma. 2D echo showed EF 55-60% and no wall  abnormality. BLE dopplers were negative for DVT. She was found to be Covid + and placed on isolation.   MRV done for work up and was negative for mass or vascular lesion or venous thrombosis. MRI brain showed evacuated left parietal hematoma with resolution of midline shift, accentuated intravascular enhancement along superficial cavity--question reactive and no hydrocephalus. She was extubated without difficulty on 04/18 and IVC removed 04/19.  CT angiogram done for further work up on 04/27 and was negative for  Aneurysm, shunt, AVM, dissection, DAVF and focal irregularity of angular branch of L-MCA M4 region questioned due to mass effect or spasm. Dr.Deveshwar recommends repeat angiogram in 6 weeks (IR scheduler to call pt).  Neurology feels that bleed due to unknown etiology and recommends SBP goal <160. Asymptomatic bradycardia has resolved, foley placed due to urinary retention and cortak replaced briefly due to poor intake.  Dysphagia has resolved and she  is tolerating regular diet.   Patient with resultant bouts of lethargy, expressive>receptive aphasia, RUE>RLE weakness and right facial droop. Therapy ongoing and CIR recommended due to functional decline.  Patient transferred to CIR on 10/30/2020 .   Patient currently requires min A with mobility secondary to muscle weakness, decreased cardiorespiratoy endurance, impaired timing and sequencing, unbalanced muscle activation, motor apraxia, decreased coordination and decreased motor planning, decreased attention to right and decreased motor planning, decreased awareness, decreased problem solving, decreased safety awareness, decreased memory and delayed processing and decreased standing balance, hemiplegia and decreased balance strategies.  Prior to hospitalization, patient was independent  with mobility and lived with Family in a Other(Comment) (townhouse) home.  Home access is 1 STEStairs to enter.  Patient will benefit  from skilled PT intervention to  maximize safe functional mobility, minimize fall risk and decrease caregiver burden for planned discharge home with 24 hour assist.  Anticipate patient will benefit from follow up [facility] at discharge.  PT - End of Session Activity Tolerance: Tolerates 10 - 20 min activity with multiple rests Endurance Deficit: Yes Endurance Deficit Description: frequest rest breaks required after exertions, closes eyes with fatigue PT Assessment Rehab Potential (ACUTE/IP ONLY): Fair PT Barriers to Discharge: [place] home environment;Decreased caregiver support;Home environment access/layout;Lack of/limited family support;Insurance for SNF coverage;Medication compliance PT Patient demonstrates impairments in the following area(s): Balance;Endurance;Motor;Perception;Safety PT Transfers Functional Problem(s): Bed Mobility;Bed to Chair;Car;Furniture PT Locomotion Functional Problem(s): Ambulation;Wheelchair Mobility;Stairs PT Plan PT Intensity: Minimum of 1-2 x/day ,45 to 90 minutes PT Frequency: 5 out of 7 days PT Duration Estimated Length of Stay: 14-16 days (longer is 24/7 assist is not available) PT Treatment/Interventions: Ambulation/gait training;Balance/vestibular training;Cognitive remediation/compensation;Community reintegration;Discharge planning;Disease management/prevention;DME/adaptive equipment instruction;Functional mobility training;Neuromuscular re-education;Patient/family education;Psychosocial support;Skin care/wound management;Splinting/orthotics;Stair training;Therapeutic Activities;Therapeutic Exercise;UE/LE Strength taining/ROM;UE/LE Coordination activities;Wheelchair propulsion/positioning PT Transfers Anticipated Outcome(s): supervision PT Locomotion Anticipated Outcome(s): supervision/ CGA PT Recommendation Recommendations for Other Services: Neuropsych consult Follow Up Recommendations: Home health PT Patient destination: Home Equipment Recommended: To be determined Equipment Details:  currently has none   PT Evaluation Precautions/Restrictions Precautions Precautions: Fall Precaution Comments: R hemi, R inattention, receptive > expressive aphasia General   Vital Signs  Pain Pain Assessment Pain Scale: 0-10 Pain Score: 0-No pain Home Living/Prior Functioning Home Living Available Help at Discharge: Available PRN/intermittently;Family Type of Home: Other(Comment) (townhouse) Home Access: Stairs to enter Technical brewer of Steps: 1 STE Entrance Stairs-Rails: None Home Layout: Two level Alternate Level Stairs-Number of Steps: 14 steps to BR/ BA Alternate Level Stairs-Rails: Left (wall to R side) Bathroom Shower/Tub: Chiropodist: Standard Bathroom Accessibility: Yes  Lives With: Family Prior Function Level of Independence: Independent with basic ADLs;Independent with gait;Independent with transfers;Independent with homemaking with ambulation  Able to Take Stairs?: Yes Driving: Yes Vocation: Full time employment Vocation Requirements: active - works in [place]: 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 Perception Perception: Impaired Inattention/Neglect: Does not attend to right side of body Praxis Praxis: Impaired Praxis Impairment Details: Motor planning;Initiation;Ideomotor  Cognition Overall Cognitive Status: Difficult to assess (2/2 expressive/receptive aphasia) Arousal/Alertness: Awake/alert Orientation Level: Oriented to person;Oriented to place;Oriented to situation Attention: Sustained Sustained Attention: Appears intact Memory: Impaired Memory Impairment: Decreased recall of new information;Storage deficit;Decreased short term memory;Retrieval deficit Awareness: Impaired Problem Solving: Impaired Behaviors: Impulsive Safety/Judgment:  Impaired Sensation Sensation Light Touch: Impaired by gross assessment Coordination Gross Motor Movements are Fluid and Coordinated: No Fine Motor Movements are Fluid and Coordinated: No Coordination and Movement Description: R sided weakness and bias in balance, generalized deconditioning Heel Shin Test: unable on RLE, unable to follow correct instructions for LLE Motor      Trunk/Postural Assessment  Cervical Assessment Cervical Assessment: Within Functional Limits Thoracic Assessment Thoracic Assessment: Within Functional Limits Lumbar Assessment Lumbar Assessment: Within Functional Limits Postural Control Postural Control: Deficits on evaluation (R trunk bias)  Balance Balance Balance Assessed: Yes Standardized Balance Assessment Standardized Balance Assessment: PASS Static Sitting Balance Static Sitting - Balance Support: Feet supported Static Sitting - Level of Assistance: 5: Stand by assistance Dynamic Sitting Balance Dynamic Sitting - Balance Support: Feet supported Dynamic Sitting - Level of Assistance: 5: Stand by assistance Dynamic Sitting Balance -  Compensations: CGA Dynamic Sitting - Balance Activities: Forward lean/weight shifting;Reaching across midline;Lateral lean/weight shifting;Reaching for objects Static Standing Balance Static Standing - Balance Support: During functional activity;Left upper extremity supported Static Standing - Level of Assistance: 5: Stand by assistance Dynamic Standing Balance Dynamic Standing - Balance Support: During functional activity;Left upper extremity supported Dynamic Standing - Level of Assistance: 4: Min assist Dynamic Standing - Balance Activities: Lateral lean/weight shifting;Forward lean/weight shifting;Reaching for objects;Reaching across midline Extremity Assessment      RLE Assessment RLE Assessment: Exceptions to [facility] RLE Strength Right Hip Flexion: 3/5 Right Hip Extension: 3+/5 Right Hip ABduction: 3+/5 Right  Hip ADduction: 4-/5 Right Knee Flexion: 3+/5 Right Knee Extension: 4/5 Right Ankle Dorsiflexion: 2+/5 Right Ankle Plantar Flexion: 2+/5 LLE Assessment LLE Assessment: Exceptions to [facility] LLE Strength Left Hip Flexion: 4-/5 Left Hip Extension: 4-/5 Left Hip ABduction: 4/5 Left Hip ADduction: 4/5 Left Knee Flexion: 4-/5 Left Knee Extension: 4/5 Left Ankle Dorsiflexion: 4+/5 Left Ankle Plantar Flexion: 4+/5  Care Tool Care Tool Bed Mobility Roll left and right activity   Roll left and right assist level: Supervision/Verbal cueing    Sit to lying activity   Sit to lying assist level: Supervision/Verbal cueing    Lying to sitting edge of bed activity   Lying to sitting edge of bed assist level: Supervision/Verbal cueing     Care Tool Transfers Sit to stand transfer   Sit to stand assist level: Contact Guard/Touching assist    Chair/bed transfer   Chair/bed transfer assist level: Minimal Assistance - Patient > 75%     Toilet transfer   Assist Level: Contact Guard/Touching Gaffer transfer assist level: Minimal Assistance - Patient > 75%      Care Tool Locomotion Ambulation   Assist level: Minimal Assistance - Patient > 75% Assistive device: Walker-rolling Max distance: 150 ft  Walk 10 feet activity   Assist level: Minimal Assistance - Patient > 75% Assistive device: Walker-rolling   Walk 50 feet with 2 turns activity   Assist level: Minimal Assistance - Patient > 75% Assistive device: Walker-rolling  Walk 150 feet activity   Assist level: Minimal Assistance - Patient > 75% Assistive device: Walker-rolling  Walk 10 feet on uneven surfaces activity Walk 10 feet on uneven surfaces activity did not occur: Safety/medical concerns      Stairs   Assist level: Minimal Assistance - Patient > 75% Stairs assistive device: 1 hand rail Max number of stairs: 4  Walk up/down 1 step activity   Walk up/down 1 step (curb) assist level: Minimal Assistance -  Patient > 75% Walk up/down 1 step or curb assistive device: 1 hand rail    Walk up/down 4 steps activity Walk up/down 4 steps assist level: Minimal Assistance - Patient > 75% Walk up/down 4 steps assistive device: 1 hand rail  Walk up/down 12 steps activity Walk up/down 12 steps activity did not occur: Safety/medical concerns      Pick up small objects from floor   Pick up small object from the floor assist level: Minimal Assistance - Patient > 75% Pick up small object from the floor assistive device: RW  Wheelchair Will patient use wheelchair at discharge?: Yes Type of Wheelchair: Manual   Wheelchair assist level: Total Assistance - Patient < 25%    Wheel 50 feet with 2 turns activity   Assist Level: Total Assistance - Patient < 25%  Wheel 150 feet activity   Assist Level: Total Assistance -  Patient < 25%    Refer to Care Plan for Long Term Goals  SHORT TERM GOAL WEEK 1 PT Short Term Goal 1 (Week 1): Pt will initiate w/c propel and management training. PT Short Term Goal 2 (Week 1): Pt will ambulate 200 feet with improved foot clearance with AFO. PT Short Term Goal 3 (Week 1): pt will perform safe functional standing transfers including use of armrest with CGA/ supervision and reduced need for vc.  Recommendations for other services: Neuropsych  Skilled Therapeutic Intervention Mobility Bed Mobility Bed Mobility: Supine to Sit;Sit to Supine Supine to Sit: Supervision/Verbal cueing Sit to Supine: Supervision/Verbal cueing Transfers Transfers: Sit to Stand;Stand to Sit;Stand Pivot Transfers Sit to Stand: Contact Guard/Touching assist Stand to Sit: Contact Guard/Touching assist Stand Pivot Transfers: Contact Guard/Touching assist;Minimal Assistance - Patient > 75% Stand Pivot Transfer Details: Tactile cues for weight shifting;Tactile cues for sequencing;Tactile cues for placement;Verbal cues for precautions/safety;Verbal cues for technique Transfer (Assistive device): Rolling  walker Locomotion  Gait Ambulation: Yes Gait Assistance: Minimal Assistance - Patient > 75% Gait Distance (Feet): 150 Feet Assistive device: Rolling walker Gait Assistance Details: Tactile cues for sequencing;Tactile cues for weight shifting;Verbal cues for technique;Verbal cues for precautions/safety;Verbal cues for safe use of DME/AE;Verbal cues for gait pattern Gait Gait: Yes Gait Pattern: Impaired Gait Pattern: Decreased step length - right;Decreased stride length;Decreased stance time - right;Decreased hip/knee flexion - right;Right steppage;Poor foot clearance - right Gait velocity: decr High Level Ambulation High Level Ambulation: Backwards walking Backwards Walking: requires Min A for weight shifting and vc for increased hip/ knee flexion on R Stairs / Additional Locomotion Stairs: Yes Stairs Assistance: Minimal Assistance - Patient > 75% Stair Management Technique: One rail Left Number of Stairs: 46 Ramp: Minimal Assistance - Patient >75% Curb:  (will require curb training with RW) Wheelchair Mobility Wheelchair Mobility: No  PT Evaluation completed; see above for results. PT educated patient in roles of PT vs OT, PT POC, rehab potential, rehab goals, and discharge recommendations along with recommendation for follow-up rehabilitation services. Individual treatment initiated:  Patient seated in w/c upon PT arrival with OT evaluation completing. Patient alert and agreeable to PT session. No pain complaint prior to or during session.  Therapeutic Activity: Bed Mobility: Patient performed supine to/from sit with supervision. Provided vc/ tc for improved technique. Transfers: Patient performed STS transfers with overall supervision/ CGA for balance.Does not immediately follow instructions for safe hand progression and does not remember throughout session requiring consistent cues. Completes to RW. SPVT transfers require up to [place] for balance and sequencing with RW.  Provided  vc/ tc for technique and sequencing/ safety. Impulsiveness sn incorrect movements demonstrated in new or specifically described requested activities.   Car transfer performed following verbal instructions with visual demonstration for safe technique. Pt is able to perform with slight impulsive movements and CGA.   Gait Training:  Patient ambulated 150 feet as well as several short distances in room using RW with Min A. Ambulated with significant bias to R side. Is able to produce steppage gait on RLE with vc for increased step height/ length. Additional cueing for level gaze, RW mgmt.   Pt ambulates four 6" steps using L side HR with Min A and vc/ tc for ascending with LLE and descending with RLE first. VC throughout for sequence of hand progression and clearance of step.   Wheelchair Mobility:  Patient encouraged to attempt propel of wheelchair but pt refuses as she is distracted by others in room gathering  her things for upcoming move to room on 5C.  Neuromuscular Re-ed: NMR facilitated during session with focus on performance of PASS. Pt guided in balance and . NMR performed for improvements in motor control and coordination, balance, sequencing, judgement, and self confidence/ efficacy in performing all aspects of mobility at highest level of independence.   Patient seated in w/c at end of session with RN and NT ready to transport pt to new room on 5C.     Discharge Criteria: Patient will be discharged from PT if patient refuses treatment 3 consecutive times without medical reason, if treatment goals not met, if there is a change in medical status, if patient makes no progress towards goals or if patient is discharged from hospital.  The above assessment, treatment plan, treatment alternatives and goals were discussed and mutually agreed upon: by patient  Alger Simons PT, DPT 10/31/2020, 5:56 PM

## 2020-11-01 NOTE — Plan of Care (Signed)
  Problem: RH Balance Goal: LTG Patient will maintain dynamic standing balance (PT) Description: LTG:  Patient will maintain dynamic standing balance with assistance during mobility activities (PT) Flowsheets (Taken 10/31/2020 1822) LTG: Pt will maintain dynamic standing balance during mobility activities with:: Contact Guard/Touching assist   Problem: Sit to Stand Goal: LTG:  Patient will perform sit to stand with assistance level (PT) Description: LTG:  Patient will perform sit to stand with assistance level (PT) Flowsheets (Taken 10/31/2020 1822) LTG: PT will perform sit to stand in preparation for functional mobility with assistance level: Supervision/Verbal cueing   Problem: RH Bed Mobility Goal: LTG Patient will perform bed mobility with assist (PT) Description: LTG: Patient will perform bed mobility with assistance, with/without cues (PT). Flowsheets (Taken 10/31/2020 1822) LTG: Pt will perform bed mobility with assistance level of: Independent with assistive device    Problem: RH Bed to Chair Transfers Goal: LTG Patient will perform bed/chair transfers w/assist (PT) Description: LTG: Patient will perform bed to chair transfers with assistance (PT). Flowsheets (Taken 10/31/2020 1822) LTG: Pt will perform Bed to Chair Transfers with assistance level: Supervision/Verbal cueing   Problem: RH Car Transfers Goal: LTG Patient will perform car transfers with assist (PT) Description: LTG: Patient will perform car transfers with assistance (PT). Flowsheets (Taken 10/31/2020 1822) LTG: Pt will perform car transfers with assist:: Supervision/Verbal cueing   Problem: RH Furniture Transfers Goal: LTG Patient will perform furniture transfers w/assist (OT/PT) Description: LTG: Patient will perform furniture transfers  with assistance (OT/PT). Flowsheets (Taken 10/31/2020 1822) LTG: Pt will perform furniture transfers with assist:: Supervision/Verbal cueing   Problem: RH Ambulation Goal: LTG  Patient will ambulate in controlled environment (PT) Description: LTG: Patient will ambulate in a controlled environment, # of feet with assistance (PT). Flowsheets (Taken 10/31/2020 1822) LTG: Pt will ambulate in controlled environ  assist needed:: Contact Guard/Touching assist LTG: Ambulation distance in controlled environment: 200 feet using LRAD Goal: LTG Patient will ambulate in home environment (PT) Description: LTG: Patient will ambulate in home environment, # of feet with assistance (PT). Flowsheets (Taken 10/31/2020 1822) LTG: Pt will ambulate in home environ  assist needed:: Contact Guard/Touching assist LTG: Ambulation distance in home environment: at least 50 feet using LRAD   Problem: RH Wheelchair Mobility Goal: LTG Patient will propel w/c in controlled environment (PT) Description: LTG: Patient will propel wheelchair in controlled environment, # of feet with assist (PT) Flowsheets (Taken 10/31/2020 1822) LTG: Pt will propel w/c in controlled environ  assist needed:: Supervision/Verbal cueing LTG: Propel w/c distance in controlled environment: 100 feet Goal: LTG Patient will propel w/c in home environment (PT) Description: LTG: Patient will propel wheelchair in home environment, # of feet with assistance (PT). Flowsheets (Taken 10/31/2020 1822) LTG: Pt will propel w/c in home environ  assist needed:: Supervision/Verbal cueing Distance: wheelchair distance in controlled environment: 100 LTG: Propel w/c distance in home environment: at least 50 feet   Problem: RH Stairs Goal: LTG Patient will ambulate up and down stairs w/assist (PT) Description: LTG: Patient will ambulate up and down # of stairs with assistance (PT) Flowsheets (Taken 10/31/2020 1822) LTG: Pt will ambulate up/down stairs assist needed:: Contact Guard/Touching assist LTG: Pt will  ambulate up and down number of stairs: at least 12 with HR setup as per indoor home environment and 1 step with no HR in order to exit/  enter home

## 2020-11-01 NOTE — Progress Notes (Signed)
Occupational Therapy Session Note  Patient Details  Name: Jane Martin MRN: [id number] Date of Birth: 04-Mar-1996  Today's Date: 11/01/2020 OT Individual Time: 1107-1200 OT Individual Time Calculation (min): 53 min    Short Term Goals: Week 1:  OT Short Term Goal 1 (Week 1): Pt will don shirt with min A. OT Short Term Goal 2 (Week 1): Pt will don pants min A OT Short Term Goal 3 (Week 1): Pt will complete toilet transfer close S. OT Short Term Goal 4 (Week 1): Pt will demonstrate improved attention/management of RUE during functional mobility with min VCs.  Skilled Therapeutic Interventions/Progress Updates:    Pt received semi-reclined in bed, denies pain, agreeable to therapy. Session focus on STM, functional cognition, dynamic standing balance, and RUE NMR/FMC in prep for improved ADL/IADL performance. Came to sitting EOB close S. Stand-pivot to w/c no AD CGA. Donned B socks mod A to don R sock. Standing at [place], participated in single target game - req ACE wrap to maintain R grip on stylus, overall, demonstrates improved gross motor movements, but occasionally req increased time and A to hit targets accurately. Completed 2 Memory Sequencing games, demonstrates difficulty recalling 3+ items in correct over. Finally, completed Trail Making Part B with L hand - req max VCs to accurately complete.   Assessed B grip strength and 9HPT with the following results: R hand: 9, 6, 11; average of 9 lbs; unable to complete large 9HPT L hand: 37, 29, 30; average of 32 lbs; completed large 9HPT IN 41 seconds  Provided pt with green resistive sponge and reviewed exercises to improve grip strength (sponge squeezes, digit flexion, open palm presses). Pt cont to have great difficulty isolating digits - will provided theraputty when it becomes available.    Stand-pivot transfer back to bed same manner as above and returned to supine close S.  Pt left side-lying in bed with bed alarm engaged, call bell in  reach, and all immediate needs met.    Therapy Documentation Precautions:  Precautions Precautions: Fall Precaution Comments: R hemi, R inattention, receptive > expressive aphasia Restrictions Weight Bearing Restrictions: No Pain: Pain Assessment Pain Scale: 0-10 Pain Score: 0-No pain ADL: See Care Tool for more details.   Therapy/Group: Individual Therapy  Volanda Napoleon MS, OTR/L  11/01/2020, 6:55 AM

## 2020-11-01 NOTE — Progress Notes (Signed)
PROGRESS NOTE   Subjective/Complaints: No complaints this morning.  Working with SLP   Objective:   No results found. Recent Labs    10/30/20 0355 10/31/20 0506  WBC 8.6 6.9  HGB 9.1* 9.0*  HCT 27.8* 27.5*  PLT 399 446*   Recent Labs    10/30/20 0355 10/31/20 0506  NA 137 136  K 3.9 4.1  CL 101 99  CO2 27 27  GLUCOSE 85 85  BUN 11 10  CREATININE 0.69 0.67  CALCIUM 9.5 9.8    Intake/Output Summary (Last 24 hours) at 11/01/2020 1457 Last data filed at 10/31/2020 1839 Gross per 24 hour  Intake 120 ml  Output --  Net 120 ml        Physical Exam: Vital Signs Blood pressure 114/76, pulse 73, temperature 98.4 F (36.9 C), temperature source Oral, resp. rate 17, SpO2 100 %. Gen: no distress, normal appearing HEENT: oral mucosa pink and moist, NCAT Cardio: Reg rate Chest: normal effort, normal rate of breathing Abd: soft, non-distended Ext: no edema Psych: pleasant, normal affect Skin: intact Neurologic: Cranial nerves II through XII intact, motor strength is 5/5 in left and 4/5 RIght  deltoid, bicep, tricep, grip, hip flexor, knee extensors, ankle dorsiflexor and plantar flexor Sensory exam normal difficult to assess due to aphasia- LT sensation apprears to be absent on RIght side  Cerebellar exam normal finger to nose to finger as well as heel to shin in bilateral upper and lower extremities Musculoskeletal: Full range of motion in all 4 extremities. No joint swelling    Assessment/Plan: 1. Functional deficits which require 3+ hours per day of interdisciplinary therapy in a comprehensive inpatient rehab setting.  Physiatrist is providing close team supervision and 24 hour management of active medical problems listed below.  Physiatrist and rehab team continue to assess barriers to discharge/monitor patient progress toward functional and medical goals  Care Tool:  Bathing    Body parts bathed by  patient: Right arm,Left arm,Chest,Abdomen,Front perineal area,Buttocks,Right upper leg,Left upper leg,Right lower leg,Left lower leg,Face         Bathing assist Assist Level: Contact Guard/Touching assist     Upper Body Dressing/Undressing Upper body dressing   What is the patient wearing?: Pull over shirt    Upper body assist Assist Level: Moderate Assistance - Patient 50 - 74%    Lower Body Dressing/Undressing Lower body dressing      What is the patient wearing?: Pants,Underwear/pull up     Lower body assist Assist for lower body dressing: Moderate Assistance - Patient 50 - 74%     Toileting Toileting    Toileting assist Assist for toileting: Contact Guard/Touching assist     Transfers Chair/bed transfer  Transfers assist     Chair/bed transfer assist level: Contact Guard/Touching assist     Locomotion Ambulation   Ambulation assist   Ambulation activity did not occur: N/A  Assist level: Minimal Assistance - Patient > 75% Assistive device: Walker-rolling Max distance: 150 ft   Walk 10 feet activity   Assist     Assist level: Minimal Assistance - Patient > 75% Assistive device: Walker-rolling   Walk 50 feet activity   Assist  Assist level: Minimal Assistance - Patient > 75% Assistive device: Walker-rolling    Walk 150 feet activity   Assist    Assist level: Minimal Assistance - Patient > 75% Assistive device: Walker-rolling    Walk 10 feet on uneven surface  activity   Assist Walk 10 feet on uneven surfaces activity did not occur: Safety/medical concerns         Wheelchair     Assist Will patient use wheelchair at discharge?: Yes Type of Wheelchair: Manual    Wheelchair assist level: Total Assistance - Patient < 25%      Wheelchair 50 feet with 2 turns activity    Assist        Assist Level: Total Assistance - Patient < 25%   Wheelchair 150 feet activity     Assist      Assist Level: Total  Assistance - Patient < 25%   Blood pressure 114/76, pulse 73, temperature 98.4 F (36.9 C), temperature source Oral, resp. rate 17, SpO2 100 %.  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 Antithrombotics: -DVT/anticoagulation: Pharmaceutical: Continue Lovenox -antiplatelet therapy: N/a 3. Pain Management: Continue Tylenol prn.  4. Mood: LCSW to follow for evaluation and support.  -antipsychotic agents: N/A 5. Neuropsych: This patientis?capable of making decisions on herown behalf. 6. Skin/Wound Care: Routine pressure relief measures.  7. Fluids/Electrolytes/Nutrition: Monitor I/O. Lytes stable, repeat monday.Cortrak removed, eating 50-100% meals  8. Seizure prophylaxis: On Keppra bid. 9. Urinary retention: On flomax and low dose urecholine. --monitor voiding with PVR checks. Is voiding and hasn't required in/out caths in last 24 hours 10. Acute blood loss anemia: hgb 9.0, repeat monday 11. Tachycardia:improved will monitor     LOS: 2 days A FACE TO FACE EVALUATION WAS PERFORMED  Drema Pry Carlise Stofer 11/01/2020, 2:57 PM

## 2020-11-02 NOTE — Progress Notes (Signed)
Physical Therapy Session Note  Patient Details  Name: Jane Martin MRN: [id number] Date of Birth: [birth year]  Today's Date: 11/02/2020 PT Individual Time: 0800-0900 PT Individual Time Calculation (min): 60 min   Short Term Goals: Week 1:  PT Short Term Goal 1 (Week 1): Pt will initiate w/c propel and management training. PT Short Term Goal 2 (Week 1): Pt will ambulate 200 feet with improved foot clearance with AFO. PT Short Term Goal 3 (Week 1): pt will perform safe functional standing transfers including use of armrest with CGA/ supervision and reduced need for vc.  Skilled Therapeutic Interventions/Progress Updates:    Pt received sidelying in bed asleep, arousable and agreeable to PT session. No complaints of pain. Bed mobility mod I with use of bedrails and increased time. Pt is able to don pants while seated EOB with setup A. Sit to stand with min A for balance, able to pull pants up over hips in standing. Pt is min A to doff hospital gown and don scrub top while seated EOB. Stand pivot transfer to w/c with min A for balance. Pt is setup A for oral hygiene while seated in w/c at sink, encouraged use of RUE to assist with holding tube of toothpaste and opening with LUE. Standing alt L/R cone taps with RW and min A for balance with focus on LE coordination, pt exhibits decreased control of RLE. Ambulation x 100 ft, x 150 ft, x 200 ft with RW with R hand splint and min A for balance, increased assist needed at times during turns for balance. Pt exhibits occasional ataxic gait pattern, steppage gait with RLE, and decreased RLE DF during gait. Encouraged pt have family bring in clothes as well as tennis shoes and socks to trial AFO. Pt requests to return to bed at end of session. Bed mobility mod I. Pt left supine in bed with needs in reach, bed alarm in place. Pt exhibits impulsivity throughout session with cues needed for safety. Pt also exhibits ongoing aphasia and word-finding difficulty at  times.  Therapy Documentation Precautions:  Precautions Precautions: Fall Precaution Comments: R hemi, R inattention, receptive > expressive aphasia Restrictions Weight Bearing Restrictions: No   Therapy/Group: Individual Therapy   Peter Congo, PT, DPT, CSRS  11/02/2020, 11:34 AM

## 2020-11-02 NOTE — IPOC Note (Signed)
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)     Functional Problem List: Nursing    PT Balance,Endurance,Motor,Perception,Safety  OT Balance,Sensory,Skin Integrity,Cognition,Vision,Endurance,Motor,Perception,Safety  SLP Cognition  TR         Basic ADL's: OT Eating,Grooming,Dressing,Bathing,Toileting     Advanced  ADL's: OT Simple Meal Preparation     Transfers: PT Bed Mobility,Bed to Chair,Car,Furniture  OT Toilet,Tub/Shower     Locomotion: PT Ambulation,Wheelchair Mobility,Stairs     Additional Impairments: OT Fuctional Use of Upper Extremity  SLP Communication,Social Cognition comprehension,expression Awareness  TR      Anticipated Outcomes Item Anticipated Outcome  Self Feeding mod I  Swallowing      Basic self-care  S  Toileting  S   Bathroom Transfers S  Bowel/Bladder     Transfers  supervision  Locomotion  supervision/ CGA  Communication  Min, Supervision A  Cognition  Supervision A  Pain     Safety/Judgment      Therapy Plan: PT Intensity: Minimum of 1-2 x/day ,45 to 90 minutes PT Frequency: 5 out of 7 days PT Duration Estimated Length of Stay: 14-16 days (longer is 24/7 assist is not available) OT Intensity: Minimum of 1-2 x/day, 45 to 90 minutes OT Frequency: 5 out of 7 days OT Duration/Estimated Length of Stay: 12 to 14 days SLP Intensity: Minumum of 1-2 x/day, 30 to 90 minutes SLP Frequency: 3 to 5 out of 7 days SLP Duration/Estimated Length of Stay: 12-16 days   Due to the current state of emergency, patients may not be receiving their 3-hours of Medicare-mandated therapy.   Team Interventions: Nursing Interventions    PT interventions  Ambulation/gait training,Balance/vestibular training,Cognitive remediation/compensation,Community reintegration,Discharge planning,Disease management/prevention,DME/adaptive equipment instruction,Functional mobility training,Neuromuscular re-education,Patient/family education,Psychosocial support,Skin care/wound management,Splinting/orthotics,Stair training,Therapeutic Activities,Therapeutic Exercise,UE/LE Strength taining/ROM,UE/LE Coordination activities,Wheelchair propulsion/positioning  OT Interventions Balance/vestibular training,Disease mangement/prevention,Neuromuscular re-education,Self Care/advanced ADL retraining,Therapeutic Exercise,UE/LE Strength taining/ROM,Skin care/wound managment,Wheelchair propulsion/positioning,Pain management,DME/adaptive equipment instruction,Cognitive remediation/compensation,Community reintegration,Functional electrical stimulation,Patient/family education,Splinting/orthotics,UE/LE Coordination activities,Visual/perceptual remediation/compensation,Therapeutic Activities,Psychosocial support,Functional mobility training,Discharge planning  SLP Interventions Cognitive remediation/compensation,Cueing hierarchy,Functional tasks,Environmental controls,Internal/external aids,Speech/Language facilitation,Patient/family education  TR Interventions    SW/CM Interventions Discharge Planning,Psychosocial Support,Patient/Family Education   Barriers to Discharge MD  Medical stability  Nursing      PT Inaccessible home environment,Decreased caregiver support,Home environment access/layout,Lack of/limited family support,Insurance for SNF coverage,Medication compliance    OT      SLP      SW       Team Discharge Planning: Destination: PT-Home ,OT- Home , SLP-Home Projected Follow-up: PT-Home health PT, OT-  Outpatient OT, SLP-Home Health SLP,Outpatient SLP,24 hour supervision/assistance Projected Equipment Needs: PT-To be determined, OT- Tub/shower bench, SLP-None  recommended by SLP Equipment Details: PT-currently has none, OT-  Patient/family involved in discharge planning: PT- Patient,  OT-Patient,Family member/caregiver, SLP-Patient  MD ELOS:  Medical Rehab Prognosis:  Excellent Assessment: Jane Martin is a 25 year old woman admitted to CIR with aphasia/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 Active medical issues include fLuent aphasia with reduced naming, tachycardia, acute blood loss anemia, and urinary retention. Labs and vitals are being monitored and medications are being adjusted as needed.     See Team Conference Notes for weekly updates to the plan of care

## 2020-11-03 LAB — CBC
HCT: 28.7 % — ABNORMAL LOW (ref 36.0–46.0)
Hemoglobin: 9.5 g/dL — ABNORMAL LOW (ref 12.0–15.0)
MCH: 30.2 pg (ref 26.0–34.0)
MCHC: 33.1 g/dL (ref 30.0–36.0)
MCV: 91.1 fL (ref 80.0–100.0)
Platelets: 508 10*3/uL — ABNORMAL HIGH (ref 150–400)
RBC: 3.15 MIL/uL — ABNORMAL LOW (ref 3.87–5.11)
RDW: 12.3 % (ref 11.5–15.5)
WBC: 4.4 10*3/uL (ref 4.0–10.5)
nRBC: 0 % (ref 0.0–0.2)

## 2020-11-03 LAB — BASIC METABOLIC PANEL
Anion gap: 8 (ref 5–15)
BUN: 10 mg/dL (ref 6–20)
CO2: 29 mmol/L (ref 22–32)
Calcium: 9.7 mg/dL (ref 8.9–10.3)
Chloride: 101 mmol/L (ref 98–111)
Creatinine, Ser: 0.81 mg/dL (ref 0.44–1.00)
GFR, Estimated: >60 mL/min (ref >60)
Glucose, Bld: 91 mg/dL (ref 70–99)
Potassium: 3.7 mmol/L (ref 3.5–5.1)
Sodium: 138 mmol/L (ref 135–145)

## 2020-11-03 MED ORDER — BETHANECHOL CHLORIDE 10 MG PO TABS
5.0000 mg | ORAL_TABLET | Freq: Three times a day (TID) | ORAL | Status: DC
  Administered 2020-11-03 – 2020-11-04 (×3): 5 mg via ORAL
  Filled 2020-11-03 (×3): qty 1

## 2020-11-03 NOTE — Progress Notes (Signed)
Pt given prn trazodone for sleep-effective. No c/o pain or discomfort. Voiding without difficulty. Pt refused breakfast this am.

## 2020-11-03 NOTE — Progress Notes (Signed)
Occupational Therapy Session Note  Patient Details  Name: Jane Martin MRN: [id number] Date of Birth: 1996-04-07  Today's Date: 11/03/2020 OT Individual Time: 5397-6734 OT Individual Time Calculation (min): 60 min    Short Term Goals: Week 1:  OT Short Term Goal 1 (Week 1): Pt will don shirt with min A. OT Short Term Goal 2 (Week 1): Pt will don pants min A OT Short Term Goal 3 (Week 1): Pt will complete toilet transfer close S. OT Short Term Goal 4 (Week 1): Pt will demonstrate improved attention/management of RUE during functional mobility with min VCs.  Skilled Therapeutic Interventions/Progress Updates:    Pt received in room and consented to OT treatment. Pt seen for therapeutic activities to increase R side attention. Pt instructed to complete pvc pipe tree activity requiring multiple cues for proper completion as pt picked up various pieces before coming to right corrent one 50% of the time. Pt encouraged to use R hand, however due to ataxic movements of R hand, pt was able to complete the activity with only L hand and increased time. Instructions for all tasks places on far R side to increase R side attention.  Pt brought down to therapy gym to complete building activity, therapist held L hand behind back to encourage RUE neuro re-ed and function. Pt became very tearful about condition and deficits, was able to cam and reassure pt during session. Rec. Neuro psych consult. Pt able to complete collection of foam blocks with R hand with increased time. Encouraged B task to put blocks together with picture instructions placed on r side of pt. Pt instructed to cross midline during block clean up from R side to L side and vice versa. After tx, pt left up in w/c with all needs met, RN notified of pt status.   Therapy Documentation Precautions:  Precautions Precautions: Fall Precaution Comments: R hemi, R inattention, receptive > expressive aphasia Restrictions Weight Bearing  Restrictions: No   Pain: Pain Assessment Pain Scale: Faces Pain Score: 0-No pain Faces Pain Scale: No hurt   Therapy/Group: Individual Therapy  Cassey Hurrell 11/03/2020, 12:09 PM

## 2020-11-03 NOTE — Progress Notes (Signed)
PROGRESS NOTE   Subjective/Complaints:  No issues overnight , pt still has some problems with verbal expression, sounds like her sensation may be starting to improve on RIght side  Spontaneous speech improving   ROS- neg CP, SOB, N/V/D  Objective:   No results found. Recent Labs    11/03/20 0627  WBC 4.4  HGB 9.5*  HCT 28.7*  PLT 508*   Recent Labs    11/03/20 0627  NA 138  K 3.7  CL 101  CO2 29  GLUCOSE 91  BUN 10  CREATININE 0.81  CALCIUM 9.7    Intake/Output Summary (Last 24 hours) at 11/03/2020 7026 Last data filed at 11/02/2020 2016 Gross per 24 hour  Intake 240 ml  Output 4 ml  Net 236 ml        Physical Exam: Vital Signs Blood pressure 109/75, pulse 84, temperature 98.6 F (37 C), temperature source Oral, resp. rate 14, SpO2 100 %.  General: No acute distress Mood and affect are appropriate Heart: Regular rate and rhythm no rubs murmurs or extra sounds Lungs: Clear to auscultation, breathing unlabored, no rales or wheezes Abdomen: Positive bowel sounds, soft nontender to palpation, nondistended Extremities: No clubbing, cyanosis, or edema Skin: No evidence of breakdown, no evidence of rash  Neurologic: Cranial nerves II through XII intact, motor strength is 5/5 in left and 4/5 RIght  deltoid, bicep, tricep, grip, hip flexor, knee extensors, ankle dorsiflexor and plantar flexor Sensory exam normal difficult to assess due to aphasia- LT sensation apprears to be absent on RIght side  Cerebellar exam normal finger to nose to finger as well as heel to shin in bilateral upper and lower extremities Musculoskeletal: Full range of motion in all 4 extremities. No joint swelling    Assessment/Plan: 1. Functional deficits which require 3+ hours per day of interdisciplinary therapy in a comprehensive inpatient rehab setting.  Physiatrist is providing close team supervision and 24 hour management of  active medical problems listed below.  Physiatrist and rehab team continue to assess barriers to discharge/monitor patient progress toward functional and medical goals  Care Tool:  Bathing    Body parts bathed by patient: Right arm,Left arm,Chest,Abdomen,Front perineal area,Buttocks,Right upper leg,Left upper leg,Right lower leg,Left lower leg,Face         Bathing assist Assist Level: Contact Guard/Touching assist     Upper Body Dressing/Undressing Upper body dressing   What is the patient wearing?: Pull over shirt    Upper body assist Assist Level: Minimal Assistance - Patient > 75%    Lower Body Dressing/Undressing Lower body dressing      What is the patient wearing?: Pants     Lower body assist Assist for lower body dressing: Minimal Assistance - Patient > 75%     Toileting Toileting    Toileting assist Assist for toileting: Contact Guard/Touching assist     Transfers Chair/bed transfer  Transfers assist     Chair/bed transfer assist level: Contact Guard/Touching assist     Locomotion Ambulation   Ambulation assist   Ambulation activity did not occur: N/A  Assist level: Minimal Assistance - Patient > 75% Assistive device: Walker-rolling Max distance: 200'   Walk  10 feet activity   Assist     Assist level: Minimal Assistance - Patient > 75% Assistive device: Walker-rolling   Walk 50 feet activity   Assist    Assist level: Minimal Assistance - Patient > 75% Assistive device: Walker-rolling    Walk 150 feet activity   Assist    Assist level: Minimal Assistance - Patient > 75% Assistive device: Walker-rolling    Walk 10 feet on uneven surface  activity   Assist Walk 10 feet on uneven surfaces activity did not occur: Safety/medical concerns         Wheelchair     Assist Will patient use wheelchair at discharge?: Yes Type of Wheelchair: Manual    Wheelchair assist level: Total Assistance - Patient < 25%       Wheelchair 50 feet with 2 turns activity    Assist        Assist Level: Total Assistance - Patient < 25%   Wheelchair 150 feet activity     Assist      Assist Level: Total Assistance - Patient < 25%   Blood pressure 109/75, pulse 84, temperature 98.6 F (37 C), temperature source Oral, resp. rate 14, SpO2 100 %.  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 Antithrombotics: -DVT/anticoagulation: Pharmaceutical: Continue Lovenox -antiplatelet therapy: N/a 3. Pain Management: Continue Tylenol prn.  4. Mood: LCSW to follow for evaluation and support.  -antipsychotic agents: N/A 5. Neuropsych: This patientis?capable of making decisions on herown behalf. 6. Skin/Wound Care: Routine pressure relief measures.  7. Fluids/Electrolytes/Nutrition: Monitor I/O. Lytes stable, repeat monday.Cortrak removed, eating 50-100% meals - dysphagia resolved low po fluid intake  8. Seizure prophylaxis: On Keppra bid. 9. Urinary retention: On flomax and low dose urecholine. --monitor voiding with PVR checks. Is voiding and hasn't required in/out caths in last several days 67ml residual will - reduce urecholine to 5mg  today an dif still voiding should be able to d/c  Tomorrow,  May also d/c flomax if voiding well off urecholine  10. Acute blood loss anemia: hgb 9.0, repeat monday 11. Tachycardia:improved will monitor     LOS: 4 days A FACE TO FACE EVALUATION WAS PERFORMED  11/03/2020, 9:21 AM

## 2020-11-03 NOTE — Progress Notes (Signed)
Occupational Therapy Session Note  Patient Details  Name: Jane Martin MRN: [id number] Date of Birth: 1996-04-15  Today's Date: 11/03/2020 OT Individual Time: 9629-5284 and 1328-1400 OT Individual Time Calculation (min): 55 min and 32 min   Short Term Goals: Week 1:  OT Short Term Goal 1 (Week 1): Pt will don shirt with min A. OT Short Term Goal 2 (Week 1): Pt will don pants min A OT Short Term Goal 3 (Week 1): Pt will complete toilet transfer close S. OT Short Term Goal 4 (Week 1): Pt will demonstrate improved attention/management of RUE during functional mobility with min VCs.  Skilled Therapeutic Interventions/Progress Updates:    1) Treatment session with focus on self-care retraining, dynamic standing balance, and R attention and functional use of RUE. Pt received supine in bed asleep.  Pt easily awakened to name.  Pt agreeable to shower this session.  Pt completed bed mobility Mod I and ambulated to room shower without AD with CGA due to increased sway with ambulation.  Pt completed bathing at sit > stand level in room shower with cues to sit for LB dressing due to mild LOB bilaterally in single leg stance.  Pt utilizing RUE as gross assist during bathing.  Pt completed dressing with overall min assist for dressing R side of body and CGA when standing to pull pants over hips.  Pt ambulated to sink and completed oral care utilizing RUE as gross assist to hold toothbrush while applying toothpaste with L hand.  Pt brushed with L hand due to decreased strength and coordination on R.  Pt demonstrating mild R inattention during oral care.  Pt returned to bed CGA ambulating without AD and returned to semi-reclined.  2) Treatment session with focus on functional mobility and activity tolerance.  Pt received upright in bed having just received lunch.  Pt and friend reporting pt more tearful today.  Pt requesting to finish lunch before therapy session.  Therapist returned and offered therapy outside  to offer change of scenery.  Pt very appreciative of being able to do therapy outside. Engaged in ambulation without AD with CGA and cues for upright posture as pt with intermittent lean.  Engaged in sidestepping and backwards walking ~5 feet all with CGA.  Pt continues to demonstrate mild decreased awareness of deficits but very appreciative of being about to be outside and have her friend present during therapy session.  Discussed neuropsych consult for coping as pt becoming more aware of impairments, pt in agreement.  Pt returned to room and completed stand pivot transfer CGA back to bed.  Pt remained semi-reclined in bed with all needs in reach.  Therapy Documentation Precautions:  Precautions Precautions: Fall Precaution Comments: R hemi, R inattention, receptive > expressive aphasia Restrictions Weight Bearing Restrictions: No General:   Vital Signs: Therapy Vitals Temp: 98.6 F (37 C) Temp Source: Oral Pulse Rate: 84 Resp: 14 BP: 109/75 Patient Position (if appropriate): Lying Oxygen Therapy SpO2: 100 % O2 Device: Room Air Pain:  1) pt with no c/o pain  2) pt with no c/o pain   Therapy/Group: Individual Therapy  Rosalio Loud 11/03/2020, 8:23 AM

## 2020-11-03 NOTE — Progress Notes (Signed)
Speech Language Pathology Daily Session Note  Patient Details  Name: Jane Martin MRN: [id number] Date of Birth: Nov 25, 1995  Today's Date: 11/03/2020 SLP Individual Time: 1220-1235 SLP Individual Time Calculation (min): 15 min  Short Term Goals: Week 1: SLP Short Term Goal 1 (Week 1): Pt will express wants/needs at the sentence level with min A verbal cues for word finding skills. SLP Short Term Goal 2 (Week 1): Pt will name common objects with 70% accuracy given min A semantic, written, phonetic and sentence completion cues. SLP Short Term Goal 3 (Week 1): Pt will write 3-4 letter words with mod A multimodal cues. SLP Short Term Goal 4 (Week 1): Pt will follow 1-2 step functional commands with mod A verbal cues. SLP Short Term Goal 5 (Week 1): Pt self-monitor and self-correct verbal and functional errors with min verbal cues. SLP Short Term Goal 6 (Week 1): Pt will read at word level with 80% accuracy given supervision A written, phonetic and sentence completion cues.  Skilled Therapeutic Interventions: Skilled SLP intervention focused on expressive language. Session completed for 15 min to make up missed time from earlier session. Pt was lying in bed and very tearful when SLP entered room. She demonstrated difficulty with word finding to communicate her concerns. She eventually stated "its just a lot" "my dad is about to come" SLP provided encouragement and time to communicate thoughts. Some improvement noted in word finding in conversation this session with questions to direct patients thoughts. SLP left pt lying in bed with bed alarm set and call button within reach. Cont with therapy per plan of care.      Pain Pain Assessment Pain Scale: Faces Pain Score: 0-No pain Faces Pain Scale: No hurt  Therapy/Group: Individual Therapy  Carlean Jews Matti Minney 11/03/2020, 12:50 PM

## 2020-11-03 NOTE — Progress Notes (Signed)
Speech Language Pathology Daily Session Note  Patient Details  Name: Jane Martin MRN: [id number] Date of Birth: 1996-03-10  Today's Date: 11/03/2020 SLP Individual Time: 1749-4496 SLP Individual Time Calculation (min): 40 min  Short Term Goals: Week 1: SLP Short Term Goal 1 (Week 1): Pt will express wants/needs at the sentence level with min A verbal cues for word finding skills. SLP Short Term Goal 2 (Week 1): Pt will name common objects with 70% accuracy given min A semantic, written, phonetic and sentence completion cues. SLP Short Term Goal 3 (Week 1): Pt will write 3-4 letter words with mod A multimodal cues. SLP Short Term Goal 4 (Week 1): Pt will follow 1-2 step functional commands with mod A verbal cues. SLP Short Term Goal 5 (Week 1): Pt self-monitor and self-correct verbal and functional errors with min verbal cues. SLP Short Term Goal 6 (Week 1): Pt will read at word level with 80% accuracy given supervision A written, phonetic and sentence completion cues.  Skilled Therapeutic Interventions: Skilled SLP intervention focused on expressive language. Writing to dictation with 3 letter words completed with 80% accuracy with min-mod A for awareness of letters written backwards. Writing to dictation with 4 letter words completed with 55% accuracy and mod A visual and verbal cues. Pt used her right hand for writing and left hand to stabilize utensil in hand. Pt read at word level with 70% accuracy with moderate verbal cues for phonemic paraphasias. Common actions in photographs named with 70% accuracy and mod A verbal cues. She increased accuracy with phonemic cues to increase word finding.Pt left seated upright in bed with call button within reach and bed alarm set. Cont with therapy per plan of care.      Pain Pain Assessment Pain Scale: Faces Pain Score: 0-No pain Faces Pain Scale: No hurt  Therapy/Group: Individual Therapy  Amil Amen A Rhylei Mcquaig 11/03/2020, 10:07 AM

## 2020-11-04 NOTE — Patient Care Conference (Signed)
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 Sula Soda Care Coodinator Present: Chana Bode, RN, BSN, CRRN;Christina [place], BSW Nurse Present: Chana Bode, RN PT Present: Raechel Chute, PT OT Present: Rosalio Loud, OT SLP Present: Colin Benton, SLP PPS Coordinator present : Fae Pippin, SLP     Current Status/Progress Goal Weekly Team Focus  Bowel/Bladder             Swallow/Nutrition/ Hydration             ADL's   CGA ambulatory transfers without AD, CGA dynamic standing in shower due to lateral LOB in single leg stance, decreased control and strength in RUE, mild R inattention  Mod I sit > stand, toileting, self-feeding, grooming; Supervision dynamic standing balance, LB dressing, toilet and tub/shower transfers, meal prep  ADL retraining, dynamic standing balance, R attention, RUE NMR, safety awareness, cognition, education   Mobility   bed mobility mod I, transfers with RW min A, gait 257ft with RW and min A  supervision/CGA overall  functional mobility/transfers, generalized strengthening, dynamic standing balance/coordination, gait training, NMR, and improved endurance.   Communication   Mod A for verbal expression and comprehension  Supervision for expression and awareness. Min A for  comprehension  word finding, writing 3-4 letter words, reading at word level, following 1-2 step commands.   Safety/Cognition/ Behavioral Observations  mod A  supervision  emergent awareness and right awareness of environment   Pain             Skin               Discharge Planning:  pt d/c home with mother to supervise pt 24/7   Team Discussion: MD adjusting and discontinuing multi medications. Medically stable and ready for discharge.  Patient on target to meet rehab goals: yes, currently mod I overall walking 200' with min assist. Transverses on uneven surfaces with CGA without and assistive device. Limited dorsiflexion of right foot which lends to a "funky" gait. Progress limited by poor awareness,, impulsivity, poor safety awareness, right inattention, and receptive/expressive aphasia and decreased error awareness. Requires cues to incorporate right side into tasks. Min assist for bathing with LOB bilaterally with single leg stances. Supervision - CGA goals set for PT for discharge and Mod I goals for OT.  *See Care Plan and progress notes for long and short-term goals.   Revisions to Treatment Plan:   Teaching Needs: Safety awareness, cues to incorporate right side, medications, etc. Family education set for 11/05/20  Current Barriers to Discharge: Decreased caregiver support and Home enviroment access/layout  Possible Resolutions to Barriers: Recommend OP follow up services Shower seat/? walker     Medical Summary Current Status: urinary retention, risk for seizure, risk for clot, speech deficits, anxiety  Barriers to Discharge: Medical stability;Behavior  Barriers to Discharge Comments: urinary retention, risk for seizure, risk for clot, speech deficits, anxiety Possible Resolutions to Barriers/Weekly Focus: d/c urecholine, d/c keppra, d/c lovenox, neuropsych evaluation   Continued Need for Acute Rehabilitation Level of Care: The patient requires daily medical  management by a physician with specialized training in physical medicine and rehabilitation for the following reasons: Direction of a multidisciplinary physical rehabilitation program to maximize functional independence : Yes Medical management of patient stability for increased activity during participation in an intensive rehabilitation regime.: Yes Analysis of laboratory values and/or radiology reports with any subsequent need for medication adjustment and/or medical intervention. : Yes   I attest that I was present, lead the team conference, and concur with the assessment and plan of the team.   Chana Bode B 11/04/2020, 9:58 AM

## 2020-11-04 NOTE — Progress Notes (Signed)
Speech Language Pathology Daily Session Note  Patient Details  Name: [name] MRN: [id number] Date of Birth: December 04, 1995  Today's Date: 11/04/2020 SLP Individual Time: 6269-4854 SLP Individual Time Calculation (min): 43 min  Short Term Goals: Week 1: SLP Short Term Goal 1 (Week 1): Pt will express wants/needs at the sentence level with min A verbal cues for word finding skills. SLP Short Term Goal 2 (Week 1): Pt will name common objects with 70% accuracy given min A semantic, written, phonetic and sentence completion cues. SLP Short Term Goal 3 (Week 1): Pt will write 3-4 letter words with mod A multimodal cues. SLP Short Term Goal 4 (Week 1): Pt will follow 1-2 step functional commands with mod A verbal cues. SLP Short Term Goal 5 (Week 1): Pt self-monitor and self-correct verbal and functional errors with min verbal cues. SLP Short Term Goal 6 (Week 1): Pt will read at word level with 80% accuracy given supervision A written, phonetic and sentence completion cues.  Skilled Therapeutic Interventions:Skilled ST services focused on language skills. SLP facilitated expressive naming at phrase/sentence level, pt able to name noun and verbs in 10 out 10 opportunities with supervision A phonetic and sentence completion cues. Pt demonstrated greater ability to self correct verbal errors compared to pervious sessions requiring min-supervision A verbal cues. Pt demonstrated reading fluency in 11 out 14 opportunities mod I and in 14 out 14 opportunities with supervision A phonetic cues. Pt was able to write 3 letter words in 5 our 5 opportunities with x1 cues to correct error and mod A cues for 4 letter words. Session ended 17 minute early due to fatigue. Pt was left in room with call bell within reach and bed alarm set. SLP recommends to continue skilled services.      Pain Pain Assessment Pain Score: 0-No pain  Therapy/Group: Individual Therapy  Aldwin Micalizzi  [facility] 11/04/2020, 3:32 PM

## 2020-11-04 NOTE — Progress Notes (Signed)
Physical Therapy Session Note  Patient Details  Name: Jane Martin MRN: [id number] Date of Birth: [birth year]  Today's Date: 11/04/2020 PT Individual Time: 1100-1153 PT Individual Time Calculation (min): 53 min   Short Term Goals: Week 1:  PT Short Term Goal 1 (Week 1): Pt will initiate w/c propel and management training. PT Short Term Goal 2 (Week 1): Pt will ambulate 200 feet with improved foot clearance with AFO. PT Short Term Goal 3 (Week 1): pt will perform safe functional standing transfers including use of armrest with CGA/ supervision and reduced need for vc.  Skilled Therapeutic Interventions/Progress Updates:   Received pt semi-reclined in bed, pt agreeable to therapy, and denied any pain during session. Session with emphasis on functional mobility/transfers, generalized strengthening, dynamic standing balance/coordination, gait training, stair navigation, simulated car transfers, R attention/awareness, and improved activity tolerance. Pt transferred bed<>WC stand<>pivot without D and close supervision and transported to 45M ortho gym in [facility] total A. Pt performed ambulatory simulated car transfer with RW and supervision and ambulated 87ft on uneven surfaces (ramp) with RW and supervision. Pt able to stand and pick up cone with RW with supervision and no LOB. Pt navigated 16 steps with 1 rail and CGA/supervision ascending and descending with a step to pattern. Pt initially started with step through pattern but with intermittent R toe catching but able to shift to step to pattern without cues. Pt ambulated 542ft x 3 trials without AD and supervision/CGA with 1 instance of LOB when turning requiring min A to correct. Pt demonstrates steppage gait pattern on RLE and decreased coordination/sequencing with R arm swing. Pt with mild LE scissoring when turning with tendency to loose balance to R side. Incorporated dual task getting pt to name 3 objects that are red unseen, pt with increased  difficulty understanding what therapist was asking (provided example of fire truck and stop sign) but pt still only able to name things in direct line of sight and difficulty differentiating items orange vs red. Pt was able to name 3 animals when walking but was unable to count to 50 by 2's requiring mod cues. Pt initially starting counting by 1's but when therapist corrected pt she was only able to count to up to 12 by 2's, becoming silent at 12. Pt performed BUE/LE strengthening on Nustep at workload 8 for 5 minutes for a total of 180 steps for improved cardiovascular endurance with emphasis on R attention. Pt with RUE/LE intermittently slipping off but pt able to self-correct and reposition throughout. Pt transported back to room in [facility] total A and ambulated 46ft without AD and supervision back to bed. Concluded session with pt supine in bed, needs within reach, and bed alarm on. Cleared pt to ambulate with staff.   Therapy Documentation Precautions:  Precautions Precautions: Fall Precaution Comments: R hemi, R inattention, receptive > expressive aphasia Restrictions Weight Bearing Restrictions: No  Therapy/Group: Individual Therapy Martin Majestic PT, DPT   11/04/2020, 7:23 AM

## 2020-11-04 NOTE — Progress Notes (Addendum)
Patient ID: Jane Martin, female   DOB: [birth year], 25 y.o.   MRN: [id number]  SW made attempt to call pt mother. Unable to leave VM  Called pt father, left VM  Lavera Guise, [place] [phone]

## 2020-11-04 NOTE — Progress Notes (Signed)
Occupational Therapy Session Note  Patient Details  Name: Jane Martin MRN: [id number] Date of Birth: [birth year]  Today's Date: 11/04/2020 OT Individual Time: 6073-7106 OT Individual Time Calculation (min): 61 min    Short Term Goals: Week 1:  OT Short Term Goal 1 (Week 1): Pt will don shirt with min A. OT Short Term Goal 2 (Week 1): Pt will don pants min A OT Short Term Goal 3 (Week 1): Pt will complete toilet transfer close S. OT Short Term Goal 4 (Week 1): Pt will demonstrate improved attention/management of RUE during functional mobility with min VCs.  Skilled Therapeutic Interventions/Progress Updates:     Pt received in bed in room and consented to OT tx. Pt seen for ADL routine including bathing, grooming, toileting, and dressing, please see below for assist levels. Therapist wrapped IV site prior to shower to remain dry. Pt required cuing to attend to right side as pt continues to demo R side neglect. Pt asked for therapist to open toothpaste, therapist cued to use her R hand, and pt was able to use it for tasks. Pt also  Instructed in box and blacks standardized assessment. Pt scored 5 blocks on R side and 24 blocks on L side during 1 minute each. After assessment pt instructed in activity practicing with blocks to increase fine and gross motor coordination in R hand, also instructed in B hand activity with blocks to incorporate both hands at the same time during activity. Instructed in towel folding activity for B manipulation, pt able to complete activity with increased time. Pt continues to demo R side neglect, decreased insight into deficits and some mild word finding difficulties during tx today. After tx, pt left up in bed with all needs met, alarm on.  Therapy Documentation Precautions:  Precautions Precautions: Fall Precaution Comments: R hemi, R inattention, receptive > expressive aphasia Restrictions Weight Bearing Restrictions: No   Pain: Pain Assessment Pain Scale:  0-10 Pain Score: 0-No pain ADL: ADL Eating: Set up Where Assessed-Eating: Bed level,Wheelchair Grooming: Supervision/safety Where Assessed-Grooming: Standing at sink Upper Body Bathing: Minimal cueing,Contact guard Where Assessed-Upper Body Bathing: Shower Lower Body Bathing: Contact guard Where Assessed-Lower Body Bathing: Shower Upper Body Dressing: Setup Where Assessed-Upper Body Dressing: Edge of bed Lower Body Dressing: Supervision/safety Where Assessed-Lower Body Dressing: Edge of bed Toileting: Contact guard Where Assessed-Toileting: Glass blower/designer: Therapist, music Method: Counselling psychologist: Energy manager: Curator Method: Radiographer, therapeutic: Shower seat without back   Therapy/Group: Individual Therapy  Marlane Hirschmann 11/04/2020, 10:23 AM

## 2020-11-04 NOTE — Progress Notes (Signed)
Occupational Therapy Session Note  Patient Details  Name: Jane Martin MRN: [id number] Date of Birth: 01/09/1996  Today's Date: 11/04/2020 OT Individual Time: 1400-1430 OT Individual Time Calculation (min): 30 min    Short Term Goals: Week 1:  OT Short Term Goal 1 (Week 1): Pt will don shirt with min A. OT Short Term Goal 2 (Week 1): Pt will don pants min A OT Short Term Goal 3 (Week 1): Pt will complete toilet transfer close S. OT Short Term Goal 4 (Week 1): Pt will demonstrate improved attention/management of RUE during functional mobility with min VCs.  Skilled Therapeutic Interventions/Progress Updates:    Treatment session with focus on functional mobility, functional use of RUE, and challenging cognition and word finding with dual task.  Pt received supine in bed reporting "I didn't know you were coming".  Pt agreeable to therapy session.  Pt donned Crocs with supervision.  Pt ambulated >300' without AD with CGA due to decreased awareness and instability.  Engaged in ball toss with focus on R attention and functional use of RUE.  During ball toss, therapist challenged pt's cognition with naming food items alphabetically while tossing ball with therapist.  Pt demonstrating increased difficulty with word finding and unable to sequence alphabet without max-total assist.  Therapist asked pt to count by two's, with pt stating "I did that earlier", then was unable to count backwards by 1's from 100.  Pt unable to count forwards by 1's starting at 10.  Therapist questioned if tasks were challenging, with pt stating "I couldn't have done that before", demonstrating decreased awareness. Pt returned to room and reports need to toilet.  Pt bumped in to wall on R due to R inattention and then again on L when exiting bathroom with no awareness.  Therapist cueing pt to utilize RUE during clothing management and various hygiene tasks.  Pt returned to bed and left semi-reclined with all needs in  reach.  Therapy Documentation Precautions:  Precautions Precautions: Fall Precaution Comments: R hemi, R inattention, receptive > expressive aphasia Restrictions Weight Bearing Restrictions: No General:   Vital Signs: Therapy Vitals Temp: 98.5 F (36.9 C) Temp Source: Oral Pulse Rate: 79 Resp: 18 BP: 113/75 Patient Position (if appropriate): Lying Oxygen Therapy SpO2: 100 % O2 Device: Room Air Pain:  Pt with no c/o pain   Therapy/Group: Individual Therapy  Rosalio Loud 11/04/2020, 2:56 PM

## 2020-11-04 NOTE — Progress Notes (Signed)
PROGRESS NOTE   Subjective/Complaints: She is eager to go home.  Denies pain.  Sleeping well.  Voiding without difficulty  Spontaneous speech improving   ROS- denies CP, SOB, N/V/D  Objective:   No results found. Recent Labs    11/03/20 0627  WBC 4.4  HGB 9.5*  HCT 28.7*  PLT 508*   Recent Labs    11/03/20 0627  NA 138  K 3.7  CL 101  CO2 29  GLUCOSE 91  BUN 10  CREATININE 0.81  CALCIUM 9.7    Intake/Output Summary (Last 24 hours) at 11/04/2020 0849 Last data filed at 11/03/2020 1700 Gross per 24 hour  Intake 340 ml  Output --  Net 340 ml        Physical Exam: Vital Signs Blood pressure 100/68, pulse 81, temperature 98.1 F (36.7 C), temperature source Oral, resp. rate 17, SpO2 100 %. Gen: no distress, normal appearing HEENT: oral mucosa pink and moist, NCAT Cardio: Reg rate Chest: normal effort, normal rate of breathing Abd: soft, non-distended Ext: no edema Psych: pleasant, normal affect Skin: intact Neurologic: Cranial nerves II through XII intact, motor strength is 5/5 in left and 4/5 RIght  deltoid, bicep, tricep, grip, hip flexor, knee extensors, ankle dorsiflexor and plantar flexor Sensory exam normal difficult to assess due to aphasia- LT sensation apprears to be absent on RIght side  Cerebellar exam normal finger to nose to finger as well as heel to shin in bilateral upper and lower extremities Musculoskeletal: Full range of motion in all 4 extremities. No joint swelling    Assessment/Plan: 1. Functional deficits which require 3+ hours per day of interdisciplinary therapy in a comprehensive inpatient rehab setting.  Physiatrist is providing close team supervision and 24 hour management of active medical problems listed below.  Physiatrist and rehab team continue to assess barriers to discharge/monitor patient progress toward functional and medical goals  Care Tool:  Bathing     Body parts bathed by patient: Right arm,Left arm,Chest,Abdomen,Front perineal area,Buttocks,Right upper leg,Left upper leg,Right lower leg,Left lower leg,Face         Bathing assist Assist Level: Contact Guard/Touching assist     Upper Body Dressing/Undressing Upper body dressing   What is the patient wearing?: Pull over shirt    Upper body assist Assist Level: Minimal Assistance - Patient > 75%    Lower Body Dressing/Undressing Lower body dressing      What is the patient wearing?: Pants     Lower body assist Assist for lower body dressing: Minimal Assistance - Patient > 75%     Toileting Toileting    Toileting assist Assist for toileting: Contact Guard/Touching assist     Transfers Chair/bed transfer  Transfers assist     Chair/bed transfer assist level: Contact Guard/Touching assist     Locomotion Ambulation   Ambulation assist      Assist level: Minimal Assistance - Patient > 75% Assistive device: Walker-rolling Max distance: 200'   Walk 10 feet activity   Assist     Assist level: Minimal Assistance - Patient > 75% Assistive device: Walker-rolling   Walk 50 feet activity   Assist    Assist level: Minimal  Assistance - Patient > 75% Assistive device: Walker-rolling    Walk 150 feet activity   Assist    Assist level: Minimal Assistance - Patient > 75% Assistive device: Walker-rolling    Walk 10 feet on uneven surface  activity   Assist Walk 10 feet on uneven surfaces activity did not occur: Safety/medical concerns         Wheelchair     Assist Will patient use wheelchair at discharge?: Yes Type of Wheelchair: Manual    Wheelchair assist level: Total Assistance - Patient < 25%      Wheelchair 50 feet with 2 turns activity    Assist        Assist Level: Total Assistance - Patient < 25%   Wheelchair 150 feet activity     Assist      Assist Level: Total Assistance - Patient < 25%   Blood pressure  100/68, pulse 81, temperature 98.1 F (36.7 C), temperature source Oral, resp. rate 17, SpO2 100 %.  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 Impaired mobility -DVT/anticoagulation: Pharmaceutical: d/c Lovenox as ambulating >450 feet -antiplatelet therapy: N/a 3. Pain Management: N/A 4. Mood: LCSW to follow for evaluation and support.  -antipsychotic agents: N/A 5. Neuropsych: This patientis?capable of making decisions on herown behalf. 6. Skin/Wound Care: Routine pressure relief measures.  7. Fluids/Electrolytes/Nutrition: Monitor I/O. Lytes stable, repeat monday.Cortrak removed, eating 50-100% meals - dysphagia resolved low po fluid intake  8. Seizure prophylaxis: d/c Keppra since >7 days from injury and has been seizure free.  9. Urinary retention: d/c urecholine since voiding well.  10. Acute blood loss anemia: hgb 9.0, repeat monday 11. Tachycardia:improved will monitor     LOS: 5 days A FACE TO FACE EVALUATION WAS PERFORMED  Drema Pry Zafar Debrosse 11/04/2020, 8:49 AM

## 2020-11-04 NOTE — Progress Notes (Signed)
Patient ID: Jane Martin, female   DOB: 1995-11-08, 25 y.o.   MRN: [id number] Team Conference Report to Patient/Family  Team Conference discussion was reviewed with the patient and caregiver, including goals, any changes in plan of care and target discharge date.  Patient and caregiver express understanding and are in agreement.  The patient has a target discharge date of 11/06/20.  SW met with patient. Made attempt to call parents, pts reports they are currently working. Pt beleives she is progressing fast and goals can be upgraded. Pt happy about upcoming d/c date of Thursday. Pt mother will be here today and tomorrow to join therapy sessions. No additional questions or concerns.  Dyanne Iha   11/04/2020, 12:13 PM

## 2020-11-04 NOTE — Plan of Care (Signed)
  Problem: RH Wheelchair Mobility Goal: LTG Patient will propel w/c in controlled environment (PT) Description: LTG: Patient will propel wheelchair in controlled environment, # of feet with assist (PT) Outcome: Not Applicable Flowsheets (Taken 11/04/2020 1256) LTG: Pt will propel w/c in controlled environ  assist needed:: (D/C) -- Note: D/C Goal: LTG Patient will propel w/c in home environment (PT) Description: LTG: Patient will propel wheelchair in home environment, # of feet with assistance (PT). Outcome: Not Applicable Flowsheets (Taken 11/04/2020 1256) LTG: Pt will propel w/c in home environ  assist needed:: (D/C) -- Note: D/C

## 2020-11-04 NOTE — Progress Notes (Signed)
Patient ID: Jane Martin, female   DOB: 08-Jan-1996, 25 y.o.   MRN: [id number]   Pt referral faxed to [facility]  [place], [place] [phone]

## 2020-11-04 NOTE — Progress Notes (Signed)
Patient ID: Jane Martin, female   DOB: 1995/10/11, 25 y.o.   MRN: [id number]   Pt shower chair ordered through [employer].   [place], [place] [phone]

## 2020-11-05 NOTE — Progress Notes (Signed)
Occupational Therapy Discharge Summary  Patient Details  Name: Jane Martin MRN: [id number] Date of Birth: [birth year]     Patient has met 10 of 13 long term goals due to improved activity tolerance, improved balance and functional use of  RIGHT upper extremity.  Patient to discharge at overall Supervision level.  Patient's care partner is independent to provide the necessary physical assistance at discharge. While pt's use of RUE had improved, she continues to require cuing to incorporate it into her ADLs as she "forgets it is there" and also continues to demo decreased gross and fine motor control deficits with apraxic movements during functional tasks. Formal family education not completed as mother not present for grad day OT session. Issued unilateral neglect caregiver education handout. Recommend 24/7 supervision for safety purposes. Pt will continue to benefit from skilled services to address R side neglect, RUE gross and fine motor control, and improve safety awareness for maximal safety and independence.   Reasons goals not met: Short length of stay, pt continues to demonstrate cognitive deficits which include R side neglect, poor short term memory, comprehension and safety awareness deficits.   Recommendation:  Patient will benefit from ongoing skilled OT services in outpatient setting to continue to advance functional skills in the area of iADL and Reduce care partner burden.  Equipment: No equipment provided  Reasons for discharge: discharge from hospital  Patient/family agrees with progress made and goals achieved: Yes, pt reports she is ready to go home, however it is questionable she is able to make decisions about her care.   OT Discharge Precautions/Restrictions   fall  Vital Signs Therapy Vitals Temp: 98.5 F (36.9 C) Temp Source: Oral Pulse Rate: 80 Resp: 14 BP: 112/73 Patient Position (if appropriate): Sitting Oxygen Therapy SpO2: 100 % O2 Device: Room  Air Pain Pain Assessment Pain Scale: 0-10 Pain Score: 2  Pain Type: Acute pain Pain Location: Abdomen Pain Descriptors / Indicators: Cramping Pain Onset: Gradual Patients Stated Pain Goal: 0 Pain Intervention(s): Medication (See eMAR) ADL ADL Eating: Set up Where Assessed-Eating: Chair Grooming: Supervision/safety Where Assessed-Grooming: Standing at sink Upper Body Bathing: Supervision/safety Where Assessed-Upper Body Bathing: Shower Lower Body Bathing: Supervision/safety Where Assessed-Lower Body Bathing: Shower Upper Body Dressing: Setup Where Assessed-Upper Body Dressing: Edge of bed Lower Body Dressing: Supervision/safety Where Assessed-Lower Body Dressing: Edge of bed Toileting: Supervision/safety Where Assessed-Toileting: Glass blower/designer: Close supervision Toilet Transfer Method: Counselling psychologist: Energy manager: Close supervision Social research officer, government Method: Heritage manager: Civil engineer, contracting without back Vision Baseline Vision/History: No visual deficits Patient Visual Report: No change from baseline Perception  Perception: Impaired Inattention/Neglect: Does not attend to right side of body Body Part Identification: pt rarely attends to R side of body Praxis Praxis: Impaired Praxis Impairment Details: Motor planning;Initiation;Ideomotor Cognition Overall Cognitive Status: Impaired/Different from baseline Arousal/Alertness: Awake/alert Orientation Level: Oriented X4 Attention: Selective Sustained Attention: Appears intact Selective Attention: Impaired Selective Attention Impairment: Verbal complex;Functional complex Memory: Impaired Memory Impairment: Decreased recall of new information;Storage deficit;Decreased short term memory;Retrieval deficit Decreased Short Term Memory: Verbal complex;Functional complex Awareness: Impaired Awareness Impairment: Emergent impairment Problem Solving:  Impaired Problem Solving Impairment: Verbal complex;Functional complex Behaviors: Impulsive Safety/Judgment: Impaired Sensation Sensation Light Touch: Impaired by gross assessment Hot/Cold: Impaired by gross assessment Proprioception: Impaired by gross assessment Stereognosis: Impaired by gross assessment Additional Comments: absent sensation along all dermatomes on R side Coordination Gross Motor Movements are Fluid and Coordinated: No Fine Motor Movements are Fluid and Coordinated: No  Coordination and Movement Description: uncoordinated due to R hemi, R inattention, and generalized deconditioning Finger Nose Finger Test: WFL on LUE, significant dysmetria on RUE Heel Shin Test: able to cross legs but unable to follow directions to move up/down leg Motor  Motor Motor: Hemiplegia Motor - Skilled Clinical Observations: uncoordinated due to R hemi, R inattention, and generalized deconditioning Mobility  Bed Mobility Bed Mobility: Rolling Left;Rolling Right;Sit to Supine;Supine to Sit Rolling Right: Independent with assistive device Rolling Left: Independent with assistive device Supine to Sit: Independent with assistive device Sit to Supine: Independent with assistive device Transfers Sit to Stand: Supervision/Verbal cueing   Balance Balance Balance Assessed: Yes Static Sitting Balance Static Sitting - Balance Support: Feet supported;No upper extremity supported Static Sitting - Level of Assistance: 7: Independent Dynamic Sitting Balance Dynamic Sitting - Balance Support: Feet supported;No upper extremity supported;During functional activity Dynamic Sitting - Level of Assistance: 6: Modified independent (Device/Increase time) Static Standing Balance Static Standing - Balance Support: No upper extremity supported Static Standing - Level of Assistance: 7: Independent Extremity/Trunk Assessment RUE Assessment RUE Assessment: Exceptions to [facility] Passive Range of Motion (PROM)  Comments: WFL General Strength Comments: poor gross grasp, poor [place] RUE Body System: Neuro LUE Assessment LUE Assessment: Within Functional Limits   Josuel Koeppen 11/05/2020, 4:11 PM

## 2020-11-05 NOTE — Progress Notes (Addendum)
Speech Language Pathology Discharge Summary  Patient Details  Name: SHY GUALLPA MRN: [id number] Date of Birth: 07-31-1995  Today's Date: 11/05/2020 SLP Individual Time: 0148-4039 SLP Individual Time Calculation (min): 48 min   Skilled Therapeutic Interventions:  Skilled SLP intervention focused on education. Educated pt on word finding strategies and to inform others of aphasia. Pt stated she was not in a good mood this session but participated well. She completed picture description task with min-mod A phonemic cues and sentences for patient to complete to increase word finding. DC home tomorrow.     Patient has met 4 of 4 long term goals.  Patient to discharge at overall Supervision;Min level.  Reasons goals not met:     Clinical Impression/Discharge Summary:   Pt has made good progress with goals and has met 4/4 long term goals due to improvement in verbal expression of wants/needs and thoughts, comprehension of basic functional information and commands, and awareness of deficits. 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 Discharge education completed with father via phone conversation and pt will discharge home with 24 hour supervision from family. Pt will benefit from follow up SLP services to increase functional communication and to maximize her independence.  Care Partner:  Caregiver Able to Provide Assistance: Yes;No  Type of Caregiver Assistance: Physical;Cognitive  Recommendation:  Home Health SLP;Outpatient SLP;24 hour supervision/assistance  Rationale for SLP Follow Up: Maximize functional communication;Maximize cognitive function and independence;Reduce caregiver burden   Equipment: none at this time   Reasons for discharge: Refusal of 3 consecutive treatment sessions without medical reason   Patient/Family Agrees with Progress Made and Goals Achieved:  Dub Mikes A Jadis Mika 11/05/2020, 12:37 PM
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
## 2020-11-05 NOTE — Progress Notes (Signed)
Physical Therapy Session Note  Patient Details  Name: Jane Martin MRN: [id number] Date of Birth: 07/14/1995  Today's Date: 11/05/2020 PT Individual Time: 1000-1023 and 1300-1338  PT Individual Time Calculation (min): 23 min and 38 min PT Missed Time: 22 minutes PT Missed Time Reason: abdominal pain  Short Term Goals: Week 1:  PT Short Term Goal 1 (Week 1): Pt will initiate w/c [employer]el and management training. PT Short Term Goal 2 (Week 1): Pt will ambulate 200 feet with improved foot clearance with AFO. PT Short Term Goal 3 (Week 1): pt will perform safe functional standing transfers including use of armrest with CGA/ supervision and reduced need for vc.  Skilled Therapeutic Interventions/Progress Updates:   Treatment Session 1: 1000-1023 23 min Received pt sitting EOB finishing OT session, pt very eager to leave ASAP, and denied any pain but reported discomfort and irritability from menstrual cramps. Per therapist request, pt called mother to see if she was going to be present for family education but pt's mother had not left home yet but stated she was on the way. Session with focus on discharge planning, functional mobility/transfers, generalized strengthening, dynamic standing balance/coordination, gait training, and improved activity tolerance. Pt ambulated >575ft without AD and CGA/close supervision to [employer]. Pt demonstrated improvements in reciprocal arm swing and overall less steppage gait pattern on RLE. Pt continues to demonstrate narrow BOS when turning coming close to loosing her balance but no LOB noted today. Pt continues to have difficulty with dual tasking and carrying conversation while walking frequently stating "um, I don't know" when therapist asked questions. Pt requested to return to room and be "left alone" due to cramps but was apologetic. Concluded session with pt sitting EOB, needs within reach, and bed alarm on. Provided fresh drinks to pt.   Treatment Session  2: 1300-1338 38 min Received pt sidelying in bed, tearful from pain from menstrual cramps but declined any pain interventions. Pt ultimately agreeable to therapy and verbalized eagerness to D/C home. Pt's mother still not arrived for family education training; called mom but no answer. Session with focus on functional mobility/transfers, generalized strengthening, dynamic standing balance/coordination, gait training, and improved activity tolerance. Pt performed bed mobility mod I and ambulated >331ft without AD and CGA/supervision to 4W dayroom with same gait pattern mentioned above. Min cues for R attention and sequencing for arm swing. Worked on BLE strengthening on Nustep at workload 7 for 8 minutes for a total of 253 steps for improved cardiovascular endurance and reciprocal movement training. Pt became tearful and requested to return to room due to cramps. Pt ambulated >375ft without AD and CGA/supervision back to room. Pt's mother present; provided her with handout from OT regarding caregiver tips and educated on need to provide close supervision/CGA upon D/C (especially when showering, ambulating, and navigating stairs) due to cognitive deficits. Educated pt's mother on R inattention, difficulty with problem solving and dual tasking, word finding difficulties, and balance deficits and pt's mother verbalized understanding. RN present to administer Tylenol and provided pt with heat packs for cramps. Pt reported urge to toilet and ambulated to/from bathroom with supervision without AD and able to toilet and wash hands with distant supervision overall. Concluded session with pt sitting EOB eating lunch, needs within reach, and bed alarm on. Mother present at bedside. 22 minutes missed of skilled physical therapy due to abdominal pain from menstrual cramps.   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

## 2020-11-05 NOTE — Progress Notes (Signed)
PROGRESS NOTE   Subjective/Complaints: She asks for how long she will have numbness in her foot. Has no other questions or complaints  Spontaneous speech improving   ROS- denies CP, SOB, N/V/D  Objective:   No results found. Recent Labs    11/03/20 0627  WBC 4.4  HGB 9.5*  HCT 28.7*  PLT 508*   Recent Labs    11/03/20 0627  NA 138  K 3.7  CL 101  CO2 29  GLUCOSE 91  BUN 10  CREATININE 0.81  CALCIUM 9.7   No intake or output data in the 24 hours ending 11/05/20 0918      Physical Exam: Vital Signs Blood pressure 121/69, pulse (!) 103, temperature 98 F (36.7 C), temperature source Oral, resp. rate 20, SpO2 99 %. Gen: no distress, normal appearing HEENT: oral mucosa pink and moist, NCAT Cardio: Reg rate Chest: normal effort, normal rate of breathing Abd: soft, non-distended Ext: no edema Psych: pleasant, normal affect Skin: intact Neurologic: Cranial nerves II through XII intact, motor strength is 5/5 in left and 4/5 RIght  deltoid, bicep, tricep, grip, hip flexor, knee extensors, ankle dorsiflexor and plantar flexor  LT sensation apprears to be absent on RIght side  Cerebellar exam normal finger to nose to finger as well as heel to shin in bilateral upper and lower extremities Receptive>expressive aphasia Right sided neglect Musculoskeletal: Full range of motion in all 4 extremities. No joint swelling    Assessment/Plan: 1. Functional deficits which require 3+ hours per day of interdisciplinary therapy in a comprehensive inpatient rehab setting.  Physiatrist is providing close team supervision and 24 hour management of active medical problems listed below.  Physiatrist and rehab team continue to assess barriers to discharge/monitor patient progress toward functional and medical goals  Care Tool:  Bathing    Body parts bathed by patient: Right arm,Left arm,Chest,Abdomen,Front perineal  area,Buttocks,Right upper leg,Left upper leg,Right lower leg,Left lower leg,Face         Bathing assist Assist Level: Contact Guard/Touching assist     Upper Body Dressing/Undressing Upper body dressing   What is the patient wearing?: Pull over shirt    Upper body assist Assist Level: Set up assist    Lower Body Dressing/Undressing Lower body dressing      What is the patient wearing?: Pants     Lower body assist Assist for lower body dressing: Supervision/Verbal cueing     Toileting Toileting    Toileting assist Assist for toileting: Contact Guard/Touching assist     Transfers Chair/bed transfer  Transfers assist     Chair/bed transfer assist level: Supervision/Verbal cueing     Locomotion Ambulation   Ambulation assist      Assist level: Contact Guard/Touching assist Assistive device: No Device Max distance: >185ft   Walk 10 feet activity   Assist     Assist level: Contact Guard/Touching assist Assistive device: No Device   Walk 50 feet activity   Assist    Assist level: Contact Guard/Touching assist Assistive device: No Device    Walk 150 feet activity   Assist    Assist level: Contact Guard/Touching assist Assistive device: No Device    Walk  10 feet on uneven surface  activity   Assist Walk 10 feet on uneven surfaces activity did not occur: Safety/medical concerns   Assist level: Supervision/Verbal cueing Assistive device: Photographer Will patient use wheelchair at discharge?: Yes Type of Wheelchair: Manual    Wheelchair assist level: Total Assistance - Patient < 25%      Wheelchair 50 feet with 2 turns activity    Assist        Assist Level: Total Assistance - Patient < 25%   Wheelchair 150 feet activity     Assist      Assist Level: Total Assistance - Patient < 25%   Blood pressure 121/69, pulse (!) 103, temperature 98 F (36.7 C), temperature source Oral, resp.  rate 20, SpO2 99 %.  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 Impaired mobility -DVT/anticoagulation: Pharmaceutical: d/c Lovenox as ambulating >450 feet -antiplatelet therapy: N/a 3. Pain Management: N/A 4. Mood: LCSW to follow for evaluation and support.  -antipsychotic agents: N/A 5. Neuropsych: This patientis?capable of making decisions on herown behalf. 6. Skin/Wound Care: Routine pressure relief measures. D/c IV 7. Fluids/Electrolytes/Nutrition: Monitor I/O. Lytes stable, repeat monday.Cortrak removed, eating 50-100% meals - dysphagia resolved low po fluid intake. D/c Ensure.  8. Seizure prophylaxis: d/c Keppra since >7 days from injury and has been seizure free.  9. Urinary retention: d/c urecholine since voiding well. D/c flomax.  10. Acute blood loss anemia: hgb 9.0, repeat monday 11. Tachycardia:improved will monitor     LOS: 6 days A FACE TO FACE EVALUATION WAS PERFORMED  Clint Bolder P Deneane Stifter 11/05/2020, 9:18 AM

## 2020-11-05 NOTE — Progress Notes (Addendum)
Physical Therapy Discharge Summary  Patient Details  Name: Jane Martin MRN: [id number] Date of Birth: 07-24-1995  Patient has met 9 of 9 long term goals due to improved activity tolerance, improved balance and improved coordination. Patient to discharge at an ambulatory level Supervision/CGA. Patient's care partner is independent to provide the necessary physical and cognitive assistance at discharge. Pt's mother briefly present on 5/4 and verbalized understanding with all tasks to ensure safe discharge home. Emphasized pt's decreased safety awareness, balance deficits, R inattention, and difficulty problem solving with recommendation for 24/7 supervision/CGA.  All goals met   Recommendation:  Patient will benefit from ongoing skilled PT services in outpatient setting to continue to advance safe functional mobility, address ongoing impairments in generalized strengthening, dynamic standing balance/coordination, gait training, NMR, endurance, and to minimize fall risk.  Equipment: No equipment provided  Reasons for discharge: treatment goals met and discharge from hospital  Patient/family agrees with progress made and goals achieved: Yes  PT Discharge Precautions/Restrictions Precautions Precautions: Fall Precaution Comments: R hemi, R inattention, cognitive deficits Restrictions Weight Bearing Restrictions: No Cognition Overall Cognitive Status: Impaired/Different from baseline Arousal/Alertness: Awake/alert Orientation Level: Oriented X4 Memory: Impaired Awareness: Impaired Problem Solving: Impaired Behaviors: Impulsive Safety/Judgment: Impaired Sensation Sensation Light Touch: Impaired by gross assessment Proprioception: Impaired by gross assessment Additional Comments: absent sensation along all dermatomes on R side Coordination Gross Motor Movements are Fluid and Coordinated: No Fine Motor Movements are Fluid and Coordinated: No Coordination and Movement  Description: uncoordinated due to R hemi, R inattention, and generalized deconditioning Finger Nose Finger Test: WFL on LUE, significant dysmetria on RUE Heel Shin Test: able to cross legs but unable to follow directions to move up/down leg Motor  Motor Motor: Hemiplegia Motor - Skilled Clinical Observations: uncoordinated due to R hemi, R inattention, and generalized deconditioning  Mobility Bed Mobility Bed Mobility: Rolling Left;Rolling Right;Sit to Supine;Supine to Sit Rolling Right: Independent with assistive device Rolling Left: Independent with assistive device Supine to Sit: Independent with assistive device Sit to Supine: Independent with assistive device Transfers Transfers: Sit to Stand;Stand to Sit;Stand Pivot Transfers Sit to Stand: Supervision/Verbal cueing Stand to Sit: Supervision/Verbal cueing Stand Pivot Transfers: Supervision/Verbal cueing Stand Pivot Transfer Details: Verbal cues for precautions/safety;Verbal cues for technique Stand Pivot Transfer Details (indicate cue type and reason): verbal cues for R attention Transfer (Assistive device): None Locomotion  Gait Ambulation: Yes Gait Assistance: Contact Guard/Touching assist Gait Distance (Feet): 200 Feet Assistive device: None Gait Assistance Details: Verbal cues for technique;Verbal cues for precautions/safety;Verbal cues for gait pattern Gait Assistance Details: verbal cues for R attention, R foot clearance, arm swing. Gait Gait: Yes Gait Pattern: Impaired Gait Pattern: Decreased step length - right;Decreased stride length;Decreased stance time - right;Right steppage;Poor foot clearance - right;Decreased dorsiflexion - right;Decreased weight shift to right Gait velocity: decreased Stairs / Additional Locomotion Stairs: Yes Stairs Assistance: Contact Guard/Touching assist Stair Management Technique: One rail Left Number of Stairs: 16 Height of Stairs: 6 Ramp: Contact Guard/touching assist (no  AD) Wheelchair Mobility Wheelchair Mobility: No  Trunk/Postural Assessment  Cervical Assessment Cervical Assessment: Within Functional Limits Thoracic Assessment Thoracic Assessment: Within Functional Limits Lumbar Assessment Lumbar Assessment: Within Functional Limits Postural Control Postural Control: Deficits on evaluation  Balance Balance Balance Assessed: Yes Static Sitting Balance Static Sitting - Balance Support: Feet supported;No upper extremity supported Static Sitting - Level of Assistance: 7: Independent Dynamic Sitting Balance Dynamic Sitting - Balance Support: Feet supported;No upper extremity supported Dynamic Sitting - Level of Assistance: 6: Modified  independent (Device/Increase time) Static Standing Balance Static Standing - Balance Support: No upper extremity supported Static Standing - Level of Assistance: 5: Stand by assistance (supervision) Dynamic Standing Balance Dynamic Standing - Balance Support: No upper extremity supported Dynamic Standing - Level of Assistance: 5: Stand by assistance (supervision) Dynamic Standing - Comments: with transfers Extremity Assessment  RLE Assessment RLE Assessment: Exceptions to [facility] General Strength Comments: grossly generalized to 3+/5 but difficulty understanding directions LLE Assessment LLE Assessment: Exceptions to [facility] General Strength Comments: grossly generalized to 4/5 but difficulty understanding directions  Alfonse Alpers PT, DPT  11/05/2020, 7:41 AM

## 2020-11-05 NOTE — Progress Notes (Signed)
Occupational Therapy Session Note  Patient Details  Name: Jane Martin MRN: [id number] Date of Birth: 1996/03/21  Today's Date: 11/05/2020 OT Individual Time: 0906-1003 OT Individual Time Calculation (min): 57 min    Short Term Goals: Week 1:  OT Short Term Goal 1 (Week 1): Pt will don shirt with min A. OT Short Term Goal 2 (Week 1): Pt will don pants min A OT Short Term Goal 3 (Week 1): Pt will complete toilet transfer close S. OT Short Term Goal 4 (Week 1): Pt will demonstrate improved attention/management of RUE during functional mobility with min VCs.  Skilled Therapeutic Interventions/Progress Updates:    Pt received in room and consented to OT tx. Pt seen for ADL discharge analysis including bathing, dressing, grooming, oral care, and functional transfers and mobility. Pt continues to require supervision for safety during most ADLs, is able to dress with setup. Mother was not present for session, pt called and reported she was on her way. Pt required cuing throughout session to use both hands as pt still demo's R side neglect. When walking out of bathroom, pt bumps into door frame on R side. Pt demo's some mild word-finding deficits during session. During oral care, pt asks for help to dispense toothpaste onto toothbrush, cues to use both hands, pt is able to complete with increased time. Requires cuing to wash both hands as pt is unaware R hand is covered in toothpaste at end of task. Pt instructed in clothespin search/ visual scanning in room environment and to retrieve certain colors at a time using R hand only. Pt dropped clothespins often due to decreased strength and coordination. Pt instructed to put L hand in pocket in order to attend/use RUE during task. Instructed in towel folding exercises for BUE coordination skills. Pt will continue to benefit from skilled services to improve R side attention and RUE function as pt has apraxic movements with R hand during all functional tasks.  At conclusion of session, mother still not present, communication with PT, gave PT unilateral neglect handout to give to mom for caregiver education. Handed pt of to PT.  Therapy Documentation Precautions:  Precautions Precautions: Fall Precaution Comments: R hemi, R inattention, cognitive deficits Restrictions Weight Bearing Restrictions: No  Pain: Pain Assessment Pain Scale: 0-10 Pain Score: 0-No pain   Therapy/Group: Individual Therapy  Selene Peltzer 11/05/2020, 10:22 AM

## 2020-11-06 DIAGNOSIS — I612 Nontraumatic intracerebral hemorrhage in hemisphere, unspecified: Secondary | ICD-10-CM

## 2020-11-06 NOTE — Discharge Instructions (Signed)
   Inpatient Rehab Discharge Instructions  LALISA KIEHN Discharge date and time: 11/06/20   Activities/Precautions/ Functional Status: Activity: no lifting, driving, or strenuous exercise till cleared by MD Diet: low fat, low cholesterol diet Wound Care: keep wound clean and dry    Functional status:  ___ No restrictions     ___ Walk up steps independently _X__ 24/7 supervision/assistance   ___ Walk up steps with assistance ___ Intermittent supervision/assistance  _X__ Bathe/dress independently ___ Walk with walker     ___ Bathe/dress with assistance ___ Walk Independently    ___ Shower independently ___ Walk with assistance    ___ Shower with assistance _X__ No alcohol     ___ Return to work/school ________  Special Instructions: 1. No alcohol, aspirin/aspirin containing products or NSAIDs.  2. Family to assist with cognitive tasks.      My questions have been answered and I understand these instructions. I will adhere to these goals and the provided educational materials after my discharge from the hospital.  Patient/Caregiver Signature _______________________________ Date __________  Clinician Signature _______________________________________ Date __________  Please bring this form and your medication list with you to all your follow-up doctor's appointments. COMMUNITY REFERRALS UPON DISCHARGE:    Outpatient: PT     OT    ST                 Agency: [facility]  Phone: [phone]              Appointment Date/Time: TBD  Medical Equipment/Items Ordered: Shower Chair                                                 Agency/Supplier: [employer]

## 2020-11-06 NOTE — Progress Notes (Signed)
Inpatient Rehabilitation Care Coordinator Discharge Note  The overall goal for the admission was met for:   Discharge location: Yes, home  Length of Stay: Yes, 7 days  Discharge activity level: Yes, supervision  Home/community participation: Yes  Services provided included: MD, RD, PT, OT, SLP, RN, CM, TR, Pharmacy, Neuropsych and SW  Financial Services: Private Insurance: [facility]  Choices offered to/list presented to:pt  Follow-up services arranged: Outpatient: [facility]  Comments (or additional information): PT OT ST Shower Seat  Patient/Family verbalized understanding of follow-up arrangements: Yes  Individual responsible for coordination of the follow-up plan: Phineas Semen, [phone]  Confirmed correct DME delivered: Dyanne Iha 11/06/2020    Dyanne Iha

## 2020-11-06 NOTE — Progress Notes (Signed)
PROGRESS NOTE   Subjective/Complaints: No complaints this morning Ready for d/c home Therapy notes reviewed No issues overnight  Spontaneous speech improving   ROS- denies CP, SOB, N/V/D  Objective:   No results found. No results for input(s): WBC, HGB, HCT, PLT in the last 72 hours. No results for input(s): NA, K, CL, CO2, GLUCOSE, BUN, CREATININE, CALCIUM in the last 72 hours. No intake or output data in the 24 hours ending 11/06/20 0851      Physical Exam: Vital Signs Blood pressure 108/76, pulse 68, temperature 97.8 F (36.6 C), temperature source Oral, resp. rate 14, SpO2 99 %. Gen: no distress, normal appearing HEENT: oral mucosa pink and moist, NCAT Cardio: Reg rate Chest: normal effort, normal rate of breathing Abd: soft, non-distended Ext: no edema Psych: pleasant, normal affect Skin: intact Neurologic: Cranial nerves II through XII intact, motor strength is 5/5 in left and 4/5 RIght  deltoid, bicep, tricep, grip, hip flexor, knee extensors, ankle dorsiflexor and plantar flexor  LT sensation apprears to be absent on RIght side  Cerebellar exam normal finger to nose to finger as well as heel to shin in bilateral upper and lower extremities Receptive>expressive aphasia Right sided neglect Musculoskeletal: Full range of motion in all 4 extremities. No joint swelling    Assessment/Plan: 1. Functional deficits which require 3+ hours per day of interdisciplinary therapy in a comprehensive inpatient rehab setting.  Physiatrist is providing close team supervision and 24 hour management of active medical problems listed below.  Physiatrist and rehab team continue to assess barriers to discharge/monitor patient progress toward functional and medical goals  Care Tool:  Bathing    Body parts bathed by patient: Right arm,Left arm,Chest,Abdomen,Front perineal area,Buttocks,Right upper leg,Left upper leg,Right  lower leg,Left lower leg,Face         Bathing assist Assist Level: Supervision/Verbal cueing     Upper Body Dressing/Undressing Upper body dressing   What is the patient wearing?: Pull over shirt    Upper body assist Assist Level: Set up assist    Lower Body Dressing/Undressing Lower body dressing      What is the patient wearing?: Pants     Lower body assist Assist for lower body dressing: Set up assist     Toileting Toileting    Toileting assist Assist for toileting: Supervision/Verbal cueing     Transfers Chair/bed transfer  Transfers assist     Chair/bed transfer assist level: Supervision/Verbal cueing     Locomotion Ambulation   Ambulation assist      Assist level: Contact Guard/Touching assist Assistive device: No Device Max distance: >236ft   Walk 10 feet activity   Assist     Assist level: Contact Guard/Touching assist Assistive device: No Device   Walk 50 feet activity   Assist    Assist level: Contact Guard/Touching assist Assistive device: No Device    Walk 150 feet activity   Assist    Assist level: Contact Guard/Touching assist Assistive device: No Device    Walk 10 feet on uneven surface  activity   Assist Walk 10 feet on uneven surfaces activity did not occur: Safety/medical concerns   Assist level: Supervision/Verbal cueing Assistive device:  Walker-rolling   Wheelchair     Assist Will patient use wheelchair at discharge?: No Type of Wheelchair: Manual    Wheelchair assist level: Total Assistance - Patient < 25%      Wheelchair 50 feet with 2 turns activity    Assist        Assist Level: Total Assistance - Patient < 25%   Wheelchair 150 feet activity     Assist      Assist Level: Total Assistance - Patient < 25%   Blood pressure 108/76, pulse 68, temperature 97.8 F (36.6 C), temperature source Oral, resp. rate 14, SpO2 99 %.  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 Impaired mobility -DVT/anticoagulation: Pharmaceutical: d/c Lovenox as ambulating >450 feet -antiplatelet therapy: N/a 3. Pain Management: N/A 4. Mood: LCSW to follow for evaluation and support.  -antipsychotic agents: N/A 5. Neuropsych: This patientis?capable of making decisions on herown behalf. 6. Skin/Wound Care: Routine pressure relief measures. D/c IV 7. Fluids/Electrolytes/Nutrition: Monitor I/O. Lytes stable, repeat outpatient.Cortrak removed, eating 50-100% meals - dysphagia resolved low po fluid intake. D/c Ensure.  8. Seizure prophylaxis: d/c Keppra since >7 days from injury and has been seizure free.  9. Urinary retention: d/c urecholine since voiding well. D/c flomax.  10. Acute blood loss anemia: hgb 9.0, repeat outpatient.  11. Tachycardia:improved will monitor  12. Right lower extremity numbness: discussed etiology and prognosis 13. Constipation: resolved. D/c laxatives. 14. Insomnia: resolved, d/c trazodone    LOS: 7 days A FACE TO FACE EVALUATION WAS PERFORMED  Drema Pry Dianely Krehbiel 11/06/2020, 8:51 AM

## 2020-11-06 NOTE — Discharge Summary (Signed)
Physician Discharge Summary  Patient ID: Jane Martin MRN: [id number] DOB/AGE: 07-Feb-1996 25 y.o.  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 BMP Latest Ref Rng & Units 11/03/2020 10/31/2020 10/30/2020  Glucose 70 - 99 mg/dL 91 85 85  BUN 6 - 20 mg/dL '10 10 11  ' Creatinine 0.44 - 1.00 mg/dL 0.81 0.67 0.69  Sodium 135 - 145 mmol/L 138 136 137  Potassium 3.5 - 5.1 mmol/L 3.7 4.1 3.9  Chloride 98 - 111 mmol/L 101 99 101  CO2 22 - 32 mmol/L '29 27 27  ' Calcium 8.9 - 10.3 mg/dL 9.7 9.8 9.5    Hepatic Functional Panel:  Hepatic Function Latest Ref Rng & Units 10/31/2020 10/30/2020 10/29/2020  Total Protein 6.5 - 8.1 g/dL 7.1 6.9 7.3  Albumin 3.5 - 5.0 g/dL 3.4(L) 3.4(L) 3.6  AST 15 - 41 U/L 36 41 47(H)  ALT 0 - 44 U/L 83(H) 97(H) 113(H)  Alk Phosphatase 38 - 126 U/L 87 86 91  Total Bilirubin 0.3 - 1.2 mg/dL 0.6 0.4 0.4  Bilirubin, Direct 0.0 - 0.2 mg/dL - - -    CBC: CBC Latest Ref Rng & Units 11/03/2020 10/31/2020 10/30/2020  WBC 4.0 - 10.5 K/uL 4.4 6.9 8.6  Hemoglobin 12.0 - 15.0 g/dL 9.5(L) 9.0(L) 9.1(L)  Hematocrit 36.0 - 46.0 % 28.7(L) 27.5(L) 27.8(L)  Platelets 150 - 400 K/uL 508(H) 446(H) 399    CBG: No results for input(s): GLUCAP in the last 168 hours.  Brief HPI:   Jane Martin is a 25 y.o. female who was admitted on 10/17/2020 after collapsing at a gas station and was unresponsive with fixed disconjugate gaze, right hemiplegia and agonal breathing.  She was intubated in ED and found to have 5.6 cm left frontoparietal IPH decompressing the left lateral ventricle and 1 cm left to right  midline shift with effacement of basilar cisterns.  CTA head was negative for AVM, aneurysm or high-grade stenosis.  UDS positive for THC.  She was found to be COVID-positive and was placed on isolation.  She was taken to the OR emergently for left parietal craniotomy for evacuation of hematoma and placement of IVC by Dr. Ellene Route.   Hypertonic saline was used to manage edema and serial CT head showed stability.  Pathology showed benign brain parenchyma.  BLE Dopplers were negative for DVT.  CTA done for further work-up on 04/27 and was negative for aneurysm, shunt, AVM, dissection, DAVF and did show some irregularity of annular branch of left-MCA M4 region question due to mass defect or spasms.  Dr. Estanislado Pandy recommended repeat angiogram in 6 weeks and to follow-up with patient.  Neurology felt that bleed was due to unknown etiology and recommended SBP less than 160.  Hospital course also significant for issues with urinary retention, poor p.o. intake as well as dysphagia.  Mentation was improving however patient continued to have bouts of lethargy, expressive greater than receptive aphasia, right upper extremity greater than right lower extremity weakness with right foot drop.  CIR was recommended due to functional decline.   Hospital Course: Jane Martin was admitted to rehab 10/30/2020 for inpatient therapies to  consist of PT, ST and OT at least three hours five days a week. Past admission physiatrist, therapy team and rehab RN have worked together to provide customized collaborative inpatient rehab.  Her blood pressures were monitored on TID basis and and have been well controlled.  P.o. intake has been good and she is continent of bowel and bladder.  Voiding was monitored with PVR checks and she was noted to be voiding without difficulty therefore Urecholine discontinued.  Follow-up labs showed acute blood loss anemia is stable.  Check of electrolytes showed renal status and lites to be within normal  limits.  Abnormal LFTs are resolving.  She has been seizure-free during his stay and Keppra was discontinued on 05/03.   She has had intermittent reports of some numbness of right foot and this has been monitored.  Dr. Sima Matas has followed up for evaluation and input.  She was noted to have improvement in engagement when family was present and mother was educated on need for cognitive assistance.  She has been good gains during her rehab stay but continues to be limited by cognitive deficits with right neglect and poor safety awareness. Family was present briefly briefly for education and has been educated regarding cognitive assistance needed. Supervision is recommended and she will continue to receive follow up PT/ST at [facility] after discharge.  Patient with PCP after discharge.   Rehab course: During patient's stay in rehab weekly team conference was held to monitor patient's progress, set goals and discuss barriers to discharge. At admission, patient required CGA - mod assist with ADL tasks and min assist with mobility. She  exhibited mild to moderate receptive greater than expressive aphasia with 60% accuracy to follow 1 to two-step command and had moderate awareness of phonetic or  semantic errors with mild to moderate perseveration.  She  has had improvement in activity tolerance, balance, postural control as well as ability to compensate for deficits.  She requires supervision to [facility] with mobility for safety due to right inattention. She requires supervision to min assist with cognitive tasks and is showing improvement in attempts to correct paraphasias.   Disposition: Home  Diet: Low fat/Low cholesterol.   Special Instructions: 1.  No driving or strenuous activity till cleared by MD.    Allergies as of 11/06/2020      Reactions   Flagyl [metronidazole]       Medication List    STOP taking these medications   docusate sodium 100 MG capsule Commonly known as: COLACE    fluticasone 50 MCG/ACT nasal spray Commonly known as: FLONASE   levETIRAcetam 500 MG tablet Commonly known as: KEPPRA   tamsulosin 0.4 MG Caps capsule Commonly known as: FLOMAX     TAKE these medications   acetaminophen 325 MG tablet Commonly known as: TYLENOL Take 2 tablets (650 mg total) by mouth every 6 (six) hours as needed for mild pain (or temp > 37.5 C (99.5 F)).   artificial tears Oint ophthalmic ointment Commonly known as: LACRILUBE Place 1 application into both eyes as needed (apply to protect eyes from lash extensions).       Follow-up Information    Raulkar, Clide Deutscher, MD Follow up.   Specialty: Physical Medicine and Rehabilitation Why: 01/20/21 please arrive at 9:00 for 9:20am appointment Contact information: [address] [place] [address] [phone]        Obgyn, Erling Conte. Call.   Why: for post hospital follow up Contact information: [address]  [place] [address] [phone]        [facility]. Call.   Why: for post stroke follow up Contact information: [address]     [address] [place] [address] [phone]       Kristeen Miss, MD. Call.   Specialty: Neurosurgery Why: for post op check Contact information: [address] [place] [address] [place] [address] [phone]               Signed: [name] 11/10/2020, 12:32 PM

## 2020-11-06 NOTE — Progress Notes (Signed)
Patient ID: Jane Martin, female   DOB: Dec 01, 1995, 25 y.o.   MRN: [id number] Follow up with the patient regarding pending discharge. Patient was hopeful that she would had more return in her right leg however understands that she will have follow up therapy and continue to improve. No questions noted regarding information reviewed earlier for dietary modifications or medications. Pamelia Hoit

## 2020-11-07 ENCOUNTER — Other Ambulatory Visit: Payer: Self-pay | Admitting: *Deleted

## 2020-11-07 NOTE — Patient Outreach (Signed)
[facility]) Care Management  11/07/2020  STERL[place] UCCI 03-16-1996 [id number]   Transition of care call   Referral received: 10/17/20 Initial outreach:11/07/20 Insurance: [place] [employer]    Subjective: Initial successful telephone call to patient's preferred number in order to complete transition of care assessment She requested that I return call and speak with her mother in the next hour. ; 2 HIPAA identifiers verified. As requested returned  call to [name] patient mother, no answer unable to leave a voicemail message as mailbox is full.    Objective:  Per electronic medical record , Kamariya Blevens  was hospitalized at [facility] 4/15-4/28 , transferred to inpatient rehab 4/28-5/5 for Intracerebral hemorrhage, s/p craniotomy for hematoma, aphasia, right hemiparesis   Comorbidities include:  She was discharged to home on 11/06/20  without the need for home health services, referral to neuro rehab outpatient services for PT/OT/ST services, patient provided DME of shower seat at discharge. .     Plan:   Will  route unsuccessful outreach letter with [facility] Care Management pamphlet and 24 Hour Nurse Line Magnet to [employer] Care Management clinical pool to be mailed to patient's home address.  Will plan return call in the next 3 to 4 business days.    Egbert Garibaldi, RN, BSN  [facility] Care Management,Care Management Coordinator  [phone]- Mobile [phone]- Toll Free Main Office

## 2020-11-10 DIAGNOSIS — R945 Abnormal results of liver function studies: Secondary | ICD-10-CM

## 2020-11-10 DIAGNOSIS — R7989 Other specified abnormal findings of blood chemistry: Secondary | ICD-10-CM

## 2020-11-10 DIAGNOSIS — D62 Acute posthemorrhagic anemia: Secondary | ICD-10-CM

## 2020-11-11 ENCOUNTER — Other Ambulatory Visit: Payer: Self-pay | Admitting: *Deleted

## 2020-11-11 ENCOUNTER — Encounter: Payer: Self-pay | Admitting: *Deleted

## 2020-11-11 NOTE — Patient Outreach (Signed)
[facility]) Care Management  11/11/2020  Jane Martin [birth year] [id number]   Transition of care call  Referral received: 10/18/20 Initial outreach:11/07/20 Insurance: [place] [employer]MR    Subjective: Initial successful telephone call to patient's preferred number in order to complete transition of care assessment; Patient requested also that I speak with her Mother Jane Martin on speaker phone.  2 HIPAA identifiers verified. Explained purpose of call and completed transition of care assessment.  Jane Martin states that she is doing okay she reports still have what she describes a numbness on right side she denies increase in weakness . She denies complaint of headache, dizziness, tolerating mobility in the home, she report some progress with memory.  She denies post-operative problems, says surgical incisions are unremarkable. She report having a good appetite and  tolerating diet, denies bowel or bladder problems.  Patient Mother is available to assist in patient  recovery.   Reviewed accessing the following [place] Benefits : She denies any ongoing health issues and says she does not need a referral to one of the [place] chronic disease management programs.  Patient Mother reports that she filed for [facility].  Provider patient Mother contact information on [place] [employer].     Objective:  Jane Martin was hospitalized [facility] 4/15-4/28 , transferred to inpatient rehab 4/28-5/5 for Intracerebral hemorrhage, s/p craniotomy for hematoma, aphasia, right hemiparesis   Comorbidities include:  She was discharged to home on 11/06/20  without the need for home health services, referral to neuro rehab outpatient services for PT/OT/ST services, patient provided DME of shower seat at discharge. .    Assessment:  Patient voices good understanding of all discharge instructions.  See transition of care flowsheet for assessment details.   Plan:   Reviewed hospital discharge diagnosis of Intracerebral hemorrhage, s/p craniotomy for hematoma, aphasia, right hemiparesis      and discharge treatment plan using hospital discharge instructions, assessing medication adherence, reviewing problems requiring provider notification, and discussing the importance of follow up with surgeon, primary care provider and/or specialists as directed.  Reinforced worsening neuro symptoms seek medical attention for change in weakness, dizziness,headache, speech difficutly, vision changes.   Care Coordination  3 way call to Outpatient rehab to assist with scheduling outpatient appointments with patient Mother on the line, also call to [facility] Neurology appointment scheduled. Placed call to Dr.Elsner officer regarding scheduling post op visit, able to leave a message for return call to patient mother phone contact number regarding scheduling appointment. Patient mother agreeable to scheduling post discharge appointment with medical provider as states only goes to OB/GYN office for female concerns.      Reviewed [place] healthy lifestyle program information to receive discounted premium for  2023   Step 1: Get  your annual physical  Step 2: Complete your health assessment  Step 3:Identify your current health status and complete the corresponding action step between July 05, 2020 and March 05, 2021.    Patient and patient Mother  agreeable to call in the next to follow up on coordination of discharge appointments and assess for further transition of care needs.   Egbert Garibaldi, RN, BSN  [facility] Care Management,Care Management Coordinator  [phone]- Mobile [phone]- Toll Free Main Office

## 2020-11-13 ENCOUNTER — Other Ambulatory Visit: Payer: Self-pay | Admitting: *Deleted

## 2020-11-13 NOTE — Patient Outreach (Signed)
[facility]) Care Management  11/13/2020  Jane Martin June 08, 1996 [id number]     Monthly telephonic UMR case management review with UMR RNCMs and Dr Elpidio Galea. Update provided to include:  Medical Conditions Update: [facility] transferred to inpatient rehab 4/28-5/5 for Intracerebral hemorrhage, s/p craniotomy for hematoma, aphasia, right hemiparesisComorbidities include:  Shewas discharged to home on5/5/22without the need for home health services, referral to neuro rehab outpatient services for PT/OT/ST services, patient provided DME of shower seat at discharge..  Assisted with coordinating scheduling  outpatient therapy, post discharge neurology and neurosurgeon appointments during transition of care call.     Follow up: Will plan follow up transition of care appointment in the next week assess for further care coordination needs.   Egbert Garibaldi, RN, BSN  [facility] Care Management,Care Management Coordinator  [phone]- Mobile [phone]- Toll Free Main Office

## 2020-11-19 ENCOUNTER — Other Ambulatory Visit: Payer: Self-pay | Admitting: *Deleted

## 2020-11-19 NOTE — Patient Outreach (Signed)
[facility]) Care Management  11/19/2020  Jane Martin 10/04/1995 [id number]   Transition of care follow up call    Referral received: 10/18/20 Initial outreach:11/07/20 Insurance: [place] [employer]    Subjective Outreach call to patient mother Jane Martin, unsuccessful no answer able to leave a HIPAA compliant voicemail message for return call. Placed outreach call to patient successful ,Jane Martin states that she is doing fine. She denies complaint of headache, dizziness change in weakness. She report tolerating mobility in home.  She reports that her mom is at work now. Discussed with patient if her mother has been able to schedule appointment with neurosurgeon she states no but she will call and remind her. Reinforced importance of follow up with surgeon.  Discussed outpatient therapy sessions to begin on next week, patient states that her mother will be available to provide transportation.   [facility]: [facility] transferred to inpatient rehab 4/28-5/5 for Intracerebral hemorrhage, s/p craniotomy for hematoma, aphasia, right hemiparesisComorbidities include:  Shewas discharged to home on5/5/22without the need for home health services, referral to neuro rehab outpatient services for PT/OT/ST services, patient provided DME of shower seat at discharge.   Plan Patient agreeable to call in the next week for follow up on transition of care post discharge appointment coordination.    Egbert Garibaldi, RN, BSN  [facility] Care Management,Care Management Coordinator  [phone]- Mobile [phone]- Toll Free Main Office

## 2020-11-24 ENCOUNTER — Encounter: Payer: Self-pay | Admitting: Physical Therapy

## 2020-11-24 ENCOUNTER — Encounter: Payer: Self-pay | Admitting: Occupational Therapy

## 2020-11-24 ENCOUNTER — Ambulatory Visit: Payer: 59 | Attending: Physician Assistant | Admitting: Physical Therapy

## 2020-11-24 ENCOUNTER — Other Ambulatory Visit: Payer: Self-pay

## 2020-11-24 ENCOUNTER — Ambulatory Visit: Payer: 59 | Admitting: Speech Pathology

## 2020-11-24 ENCOUNTER — Ambulatory Visit: Payer: 59 | Admitting: Occupational Therapy

## 2020-11-24 DIAGNOSIS — M6281 Muscle weakness (generalized): Secondary | ICD-10-CM | POA: Diagnosis not present

## 2020-11-24 DIAGNOSIS — R2681 Unsteadiness on feet: Secondary | ICD-10-CM | POA: Diagnosis not present

## 2020-11-24 DIAGNOSIS — I69151 Hemiplegia and hemiparesis following nontraumatic intracerebral hemorrhage affecting right dominant side: Secondary | ICD-10-CM | POA: Diagnosis not present

## 2020-11-24 DIAGNOSIS — R278 Other lack of coordination: Secondary | ICD-10-CM | POA: Insufficient documentation

## 2020-11-24 DIAGNOSIS — R4701 Aphasia: Secondary | ICD-10-CM

## 2020-11-24 DIAGNOSIS — R41840 Attention and concentration deficit: Secondary | ICD-10-CM

## 2020-11-24 DIAGNOSIS — R208 Other disturbances of skin sensation: Secondary | ICD-10-CM

## 2020-11-24 DIAGNOSIS — R2689 Other abnormalities of gait and mobility: Secondary | ICD-10-CM | POA: Insufficient documentation

## 2020-11-24 DIAGNOSIS — R41844 Frontal lobe and executive function deficit: Secondary | ICD-10-CM | POA: Insufficient documentation

## 2020-11-24 NOTE — Therapy (Signed)
[facility] [address] [place], [place], [address] Phone: [phone]   Fax:  [phone]  Occupational Therapy Evaluation  Patient Details  Name: Jane Martin MRN: [id number] Date of Birth: Apr 25, 1996 Referring Provider (OT): f/u w Ihor Austin, NP   Encounter Date: 11/24/2020   OT End of Session - 11/24/20 1320    Visit Number 1    Number of Visits 25    Date for OT Re-Evaluation 02/16/21    Authorization Type [facility]    Authorization Time Period VL:MN    OT Start Time 1318    OT Stop Time 1400    OT Time Calculation (min) 42 min    Activity Tolerance Patient tolerated treatment well    Behavior During Therapy [facility] for tasks assessed/performed           History reviewed. No pertinent past medical history.  Past Surgical History:  Procedure Laterality Date  . CRANIOTOMY Left 10/17/2020   Procedure: CRANIOTOMY HEMATOMA Evacuation;  Surgeon: Barnett Abu, MD;  Location: [facility] OR;  Service: Neurosurgery;  Laterality: Left;  . IR ANGIO INTRA EXTRACRAN SEL COM CAROTID INNOMINATE BILAT MOD SED  10/29/2020  . IR ANGIO VERTEBRAL SEL VERTEBRAL BILAT MOD SED  10/29/2020  . IR US GUIDE VASC ACCESS RIGHT  10/29/2020    There were no vitals filed for this visit.   Subjective Assessment - 11/24/20 1353    Patient is accompanied by: Family member   Thelma, mom   Pertinent History None    Limitations Aphasia (expressive and receptive), Absent sensation on R hemi    Patient Stated Goals "to be able to feel myself, do for myself, drive"    Currently in Pain? No/denies             [facility] OT Assessment - 11/24/20 1320      Assessment   Medical Diagnosis L ICH    Referring Provider (OT) f/u w Ihor Austin, NP    Hand Dominance Right    Prior Therapy Inpatient Rehab      Precautions   Precautions Fall    Precaution Comments Absent Sensation R      Balance Screen   Has the patient fallen in the past 6 months No       Home  Environment   Family/patient expects to be discharged to: Private residence    Living Arrangements Other relatives    Available Help at Discharge Family    Type of Home House   townhome   Home Access Stairs    Home Layout Two level   bed and bath upstairs   Bathroom Shower/Tub Tub/Shower unit    Home Equipment Shower seat   but does not use anymore     Prior Function   Level of Independence Independent    Vocation Full time employment    Multimedia programmer at [employer]    Leisure life - going out with friends, drink with friends      ADL   Eating/Feeding Needs assist with cutting food    Grooming Minimal assistance   needs asistance with styling hair and has some difficulty with brushing teeth   Upper Body Bathing Modified independent    Lower Body Bathing Modified independent    Upper Body Dressing Independent    Lower Body Dressing Needs assist for fasteners   needs help with tying shoes   Toilet Transfer Modified independent    Toileting - Clothing Manipulation Modified independent  Toileting -  Art therapist Modified independent      IADL   Prior Level of Function Shopping independent    Shopping Needs to be accompanied on any shopping trip    Prior Level of Function Light Housekeeping independent    Light Housekeeping Maintains house alone or with occasional assistance   lives with mom   Prior Level of Function Meal Prep didn't do much prior    Meal Prep Able to complete simple cold meal and snack prep   mom fixes her stuff   Prior Level of Function Community Mobility driving prior    Community Mobility Relies on family or friends for transportation    Medication Management Takes responsibility if medication is prepared in advance in seperate dosage    Financial Management Manages financial matters independently (budgets, writes checks, pays rent, bills goes to bank), collects and keeps track of income   bills  were on auto before     Written Expression   Dominant Hand Right    Handwriting Not legible      Vision - History   Baseline Vision No visual deficits   suposed to wear them but does not     Vision Assessment   Comment pt denies any blurriness or double vision      Cognition   Overall Cognitive Status Impaired/Different from baseline    Cognition Comments Expressive and Receptive Aphasia      Observation/Other Assessments   Focus on Therapeutic Outcomes (FOTO)  38      Sensation   Light Touch Impaired by gross assessment    Stereognosis Impaired by gross assessment    Hot/Cold Impaired by gross assessment    Proprioception Impaired by gross assessment    Additional Comments pt reports not being able to feel anything on the R side      Coordination   Gross Motor Movements are Fluid and Coordinated No    Fine Motor Movements are Fluid and Coordinated No    Finger Nose Finger Test dysmetria in R    9 Hole Peg Test Right;Left    Right 9 Hole Peg Test 8 pegs in 2 minute, 30 seconds    Left 9 Hole Peg Test 24.63s    Box and Blocks R - 21 , L - 51      Perception   Perception Impaired    Body Scheme impaired    Spatial Orientation impaired      Praxis   Praxis Impaired    Praxis Impairment Details Motor planning      ROM / Strength   AROM / PROM / Strength AROM;Strength      AROM   Overall AROM  Within functional limits for tasks performed    Overall AROM Comments continue to assess      Strength   Overall Strength Within functional limits for tasks performed    Overall Strength Comments continue to assess in functional context      Hand Function   Right Hand Gross Grasp Functional   absent sensation impeding overall gross grasp   Right Hand Grip (lbs) 24.6    Left Hand Gross Grasp Functional    Left Hand Grip (lbs) 42.1                           OT Education - 11/24/20 1433    Education Details Education provided on role and purpose of OT  Person(s) Educated Patient;Parent(s)    Methods Explanation    Comprehension Verbalized understanding            OT Short Term Goals - 11/24/20 1450      OT SHORT TERM GOAL #1   Title Pt will be independent with HEP for RUE coordination and grip strength    Time 4    Period Weeks    Status New    Target Date 12/22/20      OT SHORT TERM GOAL #2   Title Pt will verbalize understanding of sensory strategies for increased safety awareness    Time 4    Period Weeks    Status New      OT SHORT TERM GOAL #3   Title Pt will perform environmental scanning in moderately distracting environment with 90% accuracy or greater.    Time 4    Period Weeks    Status New      OT SHORT TERM GOAL #4   Title Pt will improve box and blocks score by 4 blocks or more with RUE for increase in overall functional use of RUE.    Baseline R 21 L 51    Time 4    Period Weeks    Status New      OT SHORT TERM GOAL #5   Title Pt will perform simple warm meal prep with supervision and good safety awareness    Time 4    Period Weeks    Status New             OT Long Term Goals - 11/24/20 1710      OT LONG TERM GOAL #1   Title Pt will be independent with updated HEPs    Time 12    Period Weeks    Status New    Target Date 02/16/21      OT LONG TERM GOAL #2   Title Pt will increase grip strength in RUE by 8 lbs or greater for increasein functional use of RUE, dominant, hand.    Baseline R 24.6, L 42.1    Time 12    Period Weeks    Status New      OT LONG TERM GOAL #3   Title Pt will complete 9 hole peg test in 2 minutes or less with RUE for increase in fine motor coordination    Baseline R 8 pegs in 2 min, 30 seconds L 24.63s    Time 12    Period Weeks    Status New      OT LONG TERM GOAL #4   Title Pt will complete physical and cognitive task simultaneously with 90% accuracy or greater    Time 12    Period Weeks    Status New      OT LONG TERM GOAL #5   Title Pt will  demonstrate or report increased ease with styling her own hair with bilateral UE.    Time 12    Period Weeks    Status New      Long Term Additional Goals   Additional Long Term Goals Yes      OT LONG TERM GOAL #6   Title Pt and caregiver will verbalize undersatnding for return to driving and return to work recommendations.    Time 12    Period Weeks    Status New      OT LONG TERM GOAL #7   Title Pt will score a 57 or greater  on FOTO at discharge.    Baseline 38    Time 12    Period Weeks    Status New                 Plan - 11/24/20 1407    Clinical Impression Statement Pt is a 25 year old that presents to Neuro OPOT s/p collapse at gas station, followed by unresponsiveness. CT showed 5.6 cm in long axis acute left frontoparietal intraparenchymal hematoma, decompression into the left lateral ventricle which contains a substantial amount of acute blood products which extend in the third ventricle. 1.0 cm left to right midline shift with effacement of the basilar cisterns. CTA showed no aneurysm, LVO or AVM, s/p L parietal crani with evacutaion of intracerebral hemmorhage. Pt presents to OPOT with cognitive deficits for understanding and expressing, RUE absent sensation with decreased fine motor coordination, strength and perception. Pt would benefit from skilled occupational therapy to target listed areas of deficit and increase independence with ADLs and IADLs and decrease caregiver burden.    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 ADL;Decreased knowledge of use of DME;Strength;Dexterity;GMC;Proprioception;UE functional use;ROM;IADL;Vision;Decreased knowledge of precautions;FMC;Sensation;Coordination    Cognitive Skills Problem  Solve;Understand;Perception;Thought;Sequencing    Rehab Potential Good    Clinical Decision Making Limited treatment options, no task modification necessary    Comorbidities Affecting Occupational Performance: None    Modification or Assistance to Complete Evaluation  No modification of tasks or assist necessary to complete eval    OT Frequency 2x / week    OT Duration 12 weeks    OT Treatment/Interventions Self-care/ADL training;Fluidtherapy;DME and/or AE instruction;Therapeutic activities;Cognitive remediation/compensation;Visual/perceptual remediation/compensation;Patient/family education;Passive range of motion;Manual Therapy;Energy conservation;Electrical Stimulation;Neuromuscular education;Building services engineer;Therapeutic exercise    Plan sensory strategies, HEP coordination with RUE    Consulted and Agree with Plan of Care Patient;Family member/caregiver    Family Member Consulted mom, Thelma           Patient will benefit from skilled therapeutic intervention in order to improve the following deficits and impairments:   Body Structure / Function / Physical Skills: ADL,Decreased knowledge of use of DME,Strength,Dexterity,GMC,Proprioception,UE functional use,ROM,IADL,Vision,Decreased knowledge of precautions,FMC,Sensation,Coordination Cognitive Skills: Problem Solve,Understand,Perception,Thought,Sequencing     Visit Diagnosis: 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 Acute blood loss anemia 11/10/2020  . Abnormal LFTs 11/10/2020  . Aphasia 10/30/2020  . 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 Encephalopathy acute   . ICH (intracerebral hemorrhage) (HCC) 10/17/2020    Junious Dresser MOT, OTR/L  11/24/2020, 5:17 PM  [facility] [address] [place], [place], [address] Phone: [phone]   Fax:  [phone]  Name: Jane Martin MRN: [id number] Date of  Birth: [birth year]

## 2020-11-24 NOTE — Therapy (Signed)
[facility] [address] [place], [place], [address] Phone: [phone]   Fax:  [phone]  Speech Language Pathology Evaluation  Patient Details  Name: Jane MooresJahayla M Nary MRN: [id number] Date of Birth: 03-09-1996 Referring Provider (SLP): Dr. Sula SodaKrutika Raulkar   Encounter Date: 11/24/2020   End of Session - 11/24/20 1436    Visit Number 1    Number of Visits 25    Date for SLP Re-Evaluation 02/16/21    Authorization Type no auth; 130 VL PT/OT/ST combined    SLP Start Time 1224    SLP Stop Time  1311    SLP Time Calculation (min) 47 min    Activity Tolerance Patient tolerated treatment well           No past medical history on file.  Past Surgical History:  Procedure Laterality Date  . CRANIOTOMY Left 10/17/2020   Procedure: CRANIOTOMY HEMATOMA Evacuation;  Surgeon: Barnett AbuElsner, Henry, MD;  [facility];  Service: Neurosurgery;  Laterality: Left;  . IR ANGIO INTRA EXTRACRAN SEL COM CAROTID INNOMINATE BILAT MOD SED  10/29/2020  . IR ANGIO VERTEBRAL SEL VERTEBRAL BILAT MOD SED  10/29/2020  . IR US GUIDE VASC ACCESS RIGHT  10/29/2020    There were no vitals filed for this visit.   Subjective Assessment - 11/24/20 1227    Subjective "I can't talk too much, When I read I can't understand it"    Currently in Pain? Yes    Pain Score 5     Pain Location Head    Pain Orientation Left    Pain Descriptors / Indicators Sharp;Aching;Constant    Pain Type Surgical pain    Pain Onset 1 to 4 weeks ago    Pain Frequency Constant    Aggravating Factors  "IDK"    Pain Relieving Factors Tylenol              SLP Evaluation [facility] - 11/24/20 1227      SLP Visit Information   SLP Received On 11/24/20    Referring Provider (SLP) Dr. Sula SodaKrutika Raulkar    Onset Date 10/30/20    Medical Diagnosis Left ICH      Subjective   Patient/Family Stated Goal "So I can talk better how I used to be and read to understand the words"       General Information   HPI Jane MooresJahayla M. Martin is a 25 year old female who was admitted 10/17/20 after collapsing at a gas station and was unresponsive with fixed dysconjugate gaze, right hemiplegia and agonal breathing. 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 She was d/c'd to CIR, then d/c'd home. She was hopitalized 10/17/20 to 11/06/20 including CIR.    Mobility Status walks independently - PT eval today      Balance Screen   Has the patient fallen in the past 6 months No    Has the patient had a decrease in activity level because of a fear of falling?  No    Is the patient reluctant to leave their home because of a fear of falling?  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   Overall Cognitive Status Within Functional Limits for tasks assessed      Auditory Comprehension   Overall Auditory Comprehension Impaired    Yes/No Questions Impaired    Basic Biographical Questions 76-100% accurate    Complex Questions 75-100% accurate    Paragraph Comprehension (via yes/no questions) Not tested    Commands Impaired    One Step Basic Commands 75-100% accurate    Two Step Basic Commands 75-100% accurate    Multistep Basic Commands 50-74% accurate    Complex Commands 50-74% accurate    Conversation Simple    Interfering Components Processing speed    EffectiveTechniques Slowed speech;Stressing words;Pausing;Extra processing time;Repetition      Visual Recognition/Discrimination   Discrimination Not tested      Reading Comprehension   Reading Status Impaired    Word level 76-100% accurate    Sentence Level 76-100%  accurate    Paragraph Level 51-75% accurate    Functional Environmental (signs, name badge) Not tested      Expression   Primary Mode of Expression Verbal      Verbal Expression   Overall Verbal Expression Impaired    Initiation No impairment    Level of Generative/Spontaneous Verbalization Conversation    Repetition Impaired    Level of Impairment Sentence level    Naming Impairment    Responsive 76-100% accurate    Confrontation 75-100% accurate    Common Objects --   able in F: 6   Convergent Not tested    Divergent 0-24% accurate    Verbal Errors Semantic paraphasias;Phonemic paraphasias;Aware of errors    Pragmatics No impairment    Effective Techniques Phonemic cues;Semantic cues;Sentence completion      Written Expression   Dominant Hand Right    Written Expression Exceptions to [facility]    Copy Ability Sentence    Dictation Ability Phrase    Overall Writen Expression --   used non dominant Left hand     Oral Motor/Sensory Function   Overall Oral Motor/Sensory Function Impaired    Labial ROM Within Functional Limits    Labial Symmetry Within Functional Limits    Labial Strength Reduced Right    Labial Sensation Reduced Right    Labial Coordination WFL    Lingual ROM Within Functional Limits    Lingual Symmetry Within Functional Limits    Lingual Strength Within Functional Limits    Lingual Sensation Within Functional Limits    Lingual Coordination WFL    Facial ROM Within Functional Limits      Motor Speech   Overall Motor Speech Appears within functional limits for tasks assessed      Standardized Assessments   Standardized Assessments  Western Aphasia Battery revised    Western Aphasia Battery revised  74.7/100 (anomic aphasia)                           SLP Education - 11/24/20 1436    Education Details goals for ST, frequency of ST            SLP Short Term Goals - 11/24/20 1456      SLP SHORT TERM GOAL #1   Title Pt will name 10  items in persoanlly relevant category with rare min A over 2 sessions    Time 6    Period Weeks    Status New  SLP SHORT TERM GOAL #2   Title Pt will utilize 2 compensatory strategies for aphasia in structured language tasks 8/10 opportunities with occasional min A over 2 sessions    Time 6    Period Weeks    Status New      SLP SHORT TERM GOAL #3   Title Pt will comprehend 3 sentence paragraph with rare min A over 2 sessions    Time 6    Period Weeks    Status New      SLP SHORT TERM GOAL #4   Title Pt will write at sentence leve, IDing errors with occasional min 8/10 errors    Time 6    Period Weeks    Status New      SLP SHORT TERM GOAL #5   Title Pt will carryover compensations for aphasia during 12 minute simple conversation as needed with occasional min A    Time 6    Period Weeks    Status New            SLP Long Term Goals - 11/24/20 1500      SLP LONG TERM GOAL #1   Title Pt will complete complex naming tasks with 90% accuracy and occasional min A over 2 sessions    Time 12    Period Weeks    Status New      SLP LONG TERM GOAL #2   Title Pt will comprehend 3 paragraph passages of interest to her, restating 2 salient details from each paragraph with occasional min A    Time 12    Period Weeks    Status New      SLP LONG TERM GOAL #3   Title Pt will ID and correct errors on texts, e mails and other 4 sentence written communications 9/10 with occasional min A    Time 12    Period Weeks    Status New      SLP LONG TERM GOAL #4   Title Pt will carryover compensations for aphasia as needed over 20 minute moderately complex conversation with occasional min A over 2 sessions    Time 12    Period Weeks    Status New      SLP LONG TERM GOAL #5   Title Pt will improve score on The Communicative Participation Item Bank by 4 points    Time 12    Period Weeks    Status New            Plan - 11/24/20 1438    Clinical Impression Statement Aslyn Cottman is referred to outpt ST s/p Left ICH due to aphasia. She is accompanied by her mom, Wilnette Kales. Shaka  reports difficulty getting her words out. She states she" can't understand" when she watches TV and has difficulty understanding when she is reading. She is able to read short texts and can read a menu enough to find what she wants to order. Prior to CVA, Ariadna independently managed her finanaces, IADL's, and worked full time as a Naval architect. In her free time, she enjoys shopping and meeting up with friends to go out to eat. At this time, mom is helping her with her finances. Eulogia reports she experiences communication breakdowns throughout the day. She is getting together with friends and eating out. 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 Auditory comprehension is intact to simple 2 step commands and mildly complex yes/no questions. Naming tasks and conversation reveal both semantic and phonemic  paraphasias (dar/dog; beer/deer and red/white etc) Spontaneous speech and picture description and fluent with moderate word finding difficulty and paraphasias. The Communicative Participation Item Bank was administered with ST reading aloud and explaining to [facility] due to reading comprehension difficulties. Mom also assisted as needed to cue [facility] and gave her opinion. She scored 14, with noting a score of 1 (Quite a bit of difficulty) talking to strangers, communicating in the community, having a long conversation, giving detailed information, participating in a fast moving conversation and trying to persuade a family member to see a different point of view. All other quesions/scenarios had a score of 2 (A little bit of difficulty) including talking with people she knows, asking questions and communicating in a small group. At this time, Kloi can not return to her previous job due to aphasia. I recommend skilled ST to maximize communication for safety, effective and efficient participation  in conversations and for possible return to the workforce.    Speech Therapy Frequency 2x / week    Duration 12 weeks   or 25 visits   Treatment/Interventions SLP instruction and feedback;Environmental controls;Language facilitation;Cueing hierarchy;Compensatory strategies;Functional tasks;Cognitive reorganization;Compensatory techniques;Patient/family education;Multimodal communcation approach;Internal/external aids    Potential to Achieve Goals Good           Patient will benefit from skilled therapeutic intervention in order to improve the following deficits and impairments:   Aphasia    Problem List Patient Active Problem List   Diagnosis Date Noted  . Acute blood loss anemia 11/10/2020  . Abnormal LFTs 11/10/2020  . Aphasia 10/30/2020  . 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 Encephalopathy acute   . ICH (intracerebral hemorrhage) (HCC) 10/17/2020    Zalen Sequeira, Radene Journey MS, CCC-SLP 11/24/2020, 3:05 PM  [facility] [address] [place], [place], [address] Phone: [phone]   Fax:  [phone]  Name: ASIYAH PINEAU MRN: [id number] Date of Birth: [birth year]

## 2020-11-24 NOTE — Therapy (Signed)
[facility] [address] [place], [place], [address] Phone: [phone]   Fax:  [phone]  Physical Therapy Evaluation  Patient Details  Name: Jane Martin MRN: [id number] Date of Birth: 06/12/1996 Referring Provider (PT): Deatra Ina, PA-C (to see Raulkar)   Encounter Date: 11/24/2020   PT End of Session - 11/24/20 1505    Visit Number 1    Number of Visits 18    Authorization Type UMR/Medcost    PT Start Time 1402    PT Stop Time 1454    PT Time Calculation (min) 52 min    Activity Tolerance Patient tolerated treatment well    Behavior During Therapy [facility] for tasks assessed/performed           History reviewed. No pertinent past medical history.  Past Surgical History:  Procedure Laterality Date  . CRANIOTOMY Left 10/17/2020   Procedure: CRANIOTOMY HEMATOMA Evacuation;  Surgeon: Barnett Abu, MD;  Location: [facility] OR;  Service: Neurosurgery;  Laterality: Left;  . IR ANGIO INTRA EXTRACRAN SEL COM CAROTID INNOMINATE BILAT MOD SED  10/29/2020  . IR ANGIO VERTEBRAL SEL VERTEBRAL BILAT MOD SED  10/29/2020  . IR US GUIDE VASC ACCESS RIGHT  10/29/2020    There were no vitals filed for this visit.    Subjective Assessment - 11/24/20 1403    Subjective Pt reports she had a stroke.  Numb on her R side; at first had a hard time walking.  Now I'm starting to do better with walking.  Using no device.  Reports being discharged from hospital a month ago (was d/c 11/06/2020).  No falls.    Patient is accompained by: Family member   Mom   Pertinent History No significant PMH    Patient Stated Goals Pt's goal for therapy are for RLE to stop being numb and get back to normal.    Currently in Pain? Yes    Pain Score 5     Pain Location Head    Pain Orientation Left    Pain Descriptors / Indicators Aching;Constant    Pain Type Surgical pain    Pain Onset 1 to 4 weeks ago    Pain Frequency Constant    Aggravating Factors   unsure    Pain Relieving Factors tylenol              [facility] PT Assessment - 11/24/20 1407      Assessment   Medical Diagnosis L ICH    Referring Provider (PT) Deatra Ina, PA-C (to see Raulkar)    Onset Date/Surgical Date 10/18/20    Hand Dominance Right    Prior Therapy Inpatient Rehab      Precautions   Precautions Fall    Precaution Comments Avoid cooking, driving currently      Balance Screen   Has the patient fallen in the past 6 months No    Has the patient had a decrease in activity level because of a fear of falling?  No    Is the patient reluctant to leave their home because of a fear of falling?  No      Home Nurse, mental health Private residence    Living Arrangements Parent    Available Help at Discharge Family    Type of Home House    Home Access Stairs to enter    Entrance Stairs-Number of Steps 2    Entrance Stairs-Rails None    Home Layout Two level  Alternate Level Stairs-Number of Steps 12    Alternate Level Stairs-Rails Right    Home Equipment Shower seat   doesn't use     Prior Function   Level of Independence Independent    Vocation Full time employment    Multimedia programmer at [employer]    Leisure life - going out with friends, drink with friends      Cognition   Overall Cognitive Status Within Functional Limits for tasks assessed      Observation/Other Assessments   Focus on Therapeutic Outcomes (FOTO)  Functional Intake Status measure:  68 (reports unable to run short distances/unable to run)      Sensation   Light Touch Impaired by gross assessment   RLE   Proprioception Appears Intact   with shoes on for proprioception test     ROM / Strength   AROM / PROM / Strength Strength;AROM      AROM   Overall AROM  Within functional limits for tasks performed      Strength   Overall Strength Deficits    Right Hip Flexion 3+/5    Left Hip Flexion 4/5    Right Knee Flexion 3+/5    Right Knee Extension 4/5     Left Knee Flexion 4/5    Left Knee Extension 4/5    Right Ankle Dorsiflexion 3+/5    Right Ankle Plantar Flexion 3/5    Left Ankle Dorsiflexion 5/5    Left Ankle Plantar Flexion 5/5      Transfers   Transfers Sit to Stand;Stand to Sit    Sit to Stand 5: Supervision;Without upper extremity assist;From chair/3-in-1    Five time sit to stand comments  17.09    Stand to Sit 5: Supervision;Without upper extremity assist;To chair/3-in-1      Ambulation/Gait   Ambulation/Gait Yes    Ambulation/Gait Assistance 5: Supervision    Ambulation Distance (Feet) 200 Feet    Assistive device None    Gait Pattern Decreased arm swing - right;Decreased arm swing - left;Decreased stride length;Right steppage   occasional R steppage   Ambulation Surface Level;Indoor    Gait velocity 10 sec = 3.28 ft/sec    Stairs Yes    Stairs Assistance 6: Modified independent (Device/Increase time);5: Supervision    Stair Management Technique No rails;Alternating pattern;Forwards    Number of Stairs 4    Height of Stairs 6      High Level Balance   High Level Balance Comments Stands EO and EC on solid and foam 30 second, increased sway EC on foam.  SLS LLE 10 sec, RLE 3.5 sec      Functional Gait  Assessment   Gait assessed  Yes    Gait Level Surface Walks 20 ft in less than 7 sec but greater than 5.5 sec, uses assistive device, slower speed, mild gait deviations, or deviates 6-10 in outside of the 12 in walkway width.   6.56   Change in Gait Speed Able to change speed, demonstrates mild gait deviations, deviates 6-10 in outside of the 12 in walkway width, or no gait deviations, unable to achieve a major change in velocity, or uses a change in velocity, or uses an assistive device.    Gait with Horizontal Head Turns Performs head turns smoothly with slight change in gait velocity (eg, minor disruption to smooth gait path), deviates 6-10 in outside 12 in walkway width, or uses an assistive device.    Gait with Vertical  Head Turns  Performs task with slight change in gait velocity (eg, minor disruption to smooth gait path), deviates 6 - 10 in outside 12 in walkway width or uses assistive device    Gait and Pivot Turn Pivot turns safely within 3 sec and stops quickly with no loss of balance.    Step Over Obstacle Is able to step over one shoe box (4.5 in total height) without changing gait speed. No evidence of imbalance.    Gait with Narrow Base of Support Ambulates less than 4 steps heel to toe or cannot perform without assistance.    Gait with Eyes Closed Walks 20 ft, slow speed, abnormal gait pattern, evidence for imbalance, deviates 10-15 in outside 12 in walkway width. Requires more than 9 sec to ambulate 20 ft.   10.5   Ambulating Backwards Walks 20 ft, uses assistive device, slower speed, mild gait deviations, deviates 6-10 in outside 12 in walkway width.    Steps Alternating feet, no rail.    Total Score 19    FGA comment: Scores <22/30 indicate increased fall risk.                      Objective measurements completed on examination: See above findings.               PT Education - 11/24/20 1502    Education Details PT eval results, POC    Person(s) Educated Patient;Parent(s)    Methods Explanation    Comprehension Verbalized understanding            PT Short Term Goals - 11/24/20 1513      PT SHORT TERM GOAL #1   Title Pt will be independent with HEP for improved strength, balance, and gait.  TARGET 12/26/2020    Time 5    Period Weeks    Status New      PT SHORT TERM GOAL #2   Title Pt will perform 5x sit<>stand in less than or equal to 14 sec for improved transfer efficiency and functional strength.    Baseline 17.09    Time 5    Period Weeks    Status New      PT SHORT TERM GOAL #3   Title Pt will improve single limb stance on RLE to at least 10 sec for improved balance, functional strength.    Baseline 3.5 sec    Time 5    Period Weeks    Status New       PT SHORT TERM GOAL #4   Title Pt/family will verbalize understanding of fall prevention in home environment.    Time 5    Period Weeks    Status New      PT SHORT TERM GOAL #5   Title 6 minute walk test to be assessed, with goal to be written as appropriate.    Time 5    Period Weeks             PT Long Term Goals - 11/24/20 1518      PT LONG TERM GOAL #1   Title Pt will be independent with progression of HEP for improved strength, balance, and gait.  TARGET 01/23/2021    Time 9    Period Weeks    Status New      PT LONG TERM GOAL #2   Title Pt will improve 5x sit<>stand to less than or equal to 12 sec to demonstrate improved functional strength and transfer efficiency.  Time 9    Period Weeks    Status New      PT LONG TERM GOAL #3   Title Pt will improve FGA score to at least 24/30 to decrease fall risk.    Baseline 19/30    Time 9    Period Weeks    Status New      PT LONG TERM GOAL #4   Title Pt will improve gait velocity to at least 4 ft/sec for improved gait efficiency and safety.    Baseline 3.28 ft/sec (pt reports always being a fast walker)    Time 9    Period Weeks    Status New      PT LONG TERM GOAL #5   Title Pt will ambulate at least 1000 ft, indoor and outdoor surfaces, no LOB, independently, for improved community gait.    Time 9    Period Weeks    Status New      Additional Long Term Goals   Additional Long Term Goals Yes      PT LONG TERM GOAL #6   Title Pt will improve FOTO to at least 81% (predicted FOTO at d/c) to demo improved overall functional mobility.    Baseline 68%    Time 9    Period Weeks    Status New                  Plan - 11/24/20 1507    Clinical Impression Statement Pt is a 25 yo female presents to [facility] on 4/15 with collapse at gas station, followed by unresponsiveness. CTH show 5.6 cm in long axis acute left frontoparietal intraparenchymal hematoma, decompressing into the left lateral ventricle which  contains a substantial amount of acute blood products which extend  into the third ventricle; 1.0 cm of left-to-right midline shift with  effacement of the basilar cisterns. CTA head shows no aneurysm, LVO, or AVM. s/p L parietal crani with evacuation of intracerebral hemorrhage and placement of intraventricular catheter on 4/15.  Pt participated in therapies on inpatient rehab and was discharged home 11/06/2020.  Prior to hospitalization, pt was independent and was a Production designer, theatre/television/film at [employer].  She presents to OPPT with decreased RLE sensation, RLE weakness, decreased balance, decreased gait velocity and high level gait as compared to prior to CVA.  She would benefit from skilled PT to address the above stated deficits to improve overall functional mobility/independence and decreased fall risk.    Personal Factors and Comorbidities Other   No pertient PMH; severity of deficits, including aphasia   Examination-Activity Limitations Locomotion Level;Transfers;Stairs    Examination-Participation Restrictions Occupation;Community Activity;Driving    Stability/Clinical Decision Making Evolving/Moderate complexity    Clinical Decision Making Moderate    Rehab Potential Good    PT Frequency 2x / week   2x/wk x 8 weeks   PT Duration Other (comment)   plus 1x/wk on eval week; 9 week POC   PT Treatment/Interventions ADLs/Self Care Home Management;Gait training;Stair training;Functional mobility training;Therapeutic activities;Therapeutic exercise;Balance training;Neuromuscular re-education;Electrical Stimulation;Patient/family education;Manual techniques;Passive range of motion    PT Next Visit Plan Perform 6 MWT and write goal as appropriate.  Initiate HEP to address RLE strength, weightbearing, NMR; gait training, step ups, core/hip stability; work with eyes closed for balance, compliant surfaces    Consulted and Agree with Plan of Care Patient;Family member/caregiver    Family Member Consulted mom            Patient will benefit from skilled therapeutic intervention  in order to improve the following deficits and impairments:  Abnormal gait,Difficulty walking,Decreased balance,Impaired sensation,Decreased strength,Decreased mobility  Visit Diagnosis: Other abnormalities of gait and mobility  Unsteadiness on feet  Muscle weakness (generalized)     Problem List Patient Active Problem List   Diagnosis Date Noted  . Acute blood loss anemia 11/10/2020  . Abnormal LFTs 11/10/2020  . Aphasia 10/30/2020  . 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 Encephalopathy acute   . ICH (intracerebral hemorrhage) (HCC) 10/17/2020    Mykaela Arena W. 11/24/2020, 3:39 PM Gean Maidens., PT  [facility] [address] [place], [place], [address] Phone: [phone]   Fax:  [phone]  Name: MARIADELOSANG WYNNS MRN: [id number] Date of Birth: May 16, 1996

## 2020-11-25 ENCOUNTER — Other Ambulatory Visit: Payer: Self-pay | Admitting: *Deleted

## 2020-11-25 NOTE — Patient Outreach (Signed)
[facility]) Care Management  11/25/2020  Jane Martin 1995-07-22 [id number]   Transition of care follow up call    Referral received:10/18/20 Initial outreach:11/07/20 Insurance: [employer]   Subjective: Outreach call to Jane Martin she reports is doing okay. She discussed therapy sessions on yesterday and everything went really good.  Discussed follow up appointment with neurosurgeon patient states she will get her mother on the phone.  Spoke with Jane Martin, she reports receiving calls from Dr. Danielle Dess office, but unable to connect with assistant, she states they have been playing phone tag. She requested to connect with office during this call. 3 way call to Dr. Danielle Dess office only able to leave a message on his assistant Jane Martin voicemail leaving a message  regarding return call to Jane Martin, patient mother to schedule post discharge visit with Dr.Elsner, explained by discharged from inpatient rehab on 5/5 and recent craniotomy .  Patient mother reports being well pleased with patient therapy session on yesterday and hopefull for continued progress during planned 12 week follow up.    Plan Patient mother agreeable to follow up call in the next week regarding coordination of neurosurgeon follow up appointment.    Egbert Garibaldi, RN, BSN  [facility] Care Management,Care Management Coordinator  [phone]- Mobile [phone]- Toll Free Main Office

## 2020-11-26 ENCOUNTER — Encounter: Payer: Self-pay | Admitting: Physical Therapy

## 2020-11-26 ENCOUNTER — Ambulatory Visit: Payer: 59

## 2020-11-26 ENCOUNTER — Other Ambulatory Visit: Payer: Self-pay

## 2020-11-26 ENCOUNTER — Ambulatory Visit: Payer: 59 | Admitting: Physical Therapy

## 2020-11-26 ENCOUNTER — Ambulatory Visit: Payer: 59 | Admitting: Occupational Therapy

## 2020-11-26 DIAGNOSIS — R2681 Unsteadiness on feet: Secondary | ICD-10-CM | POA: Diagnosis not present

## 2020-11-26 DIAGNOSIS — I69151 Hemiplegia and hemiparesis following nontraumatic intracerebral hemorrhage affecting right dominant side: Secondary | ICD-10-CM | POA: Diagnosis not present

## 2020-11-26 DIAGNOSIS — R278 Other lack of coordination: Secondary | ICD-10-CM | POA: Diagnosis not present

## 2020-11-26 DIAGNOSIS — R41844 Frontal lobe and executive function deficit: Secondary | ICD-10-CM | POA: Diagnosis not present

## 2020-11-26 DIAGNOSIS — R4701 Aphasia: Secondary | ICD-10-CM | POA: Diagnosis not present

## 2020-11-26 DIAGNOSIS — R41840 Attention and concentration deficit: Secondary | ICD-10-CM | POA: Diagnosis not present

## 2020-11-26 DIAGNOSIS — R208 Other disturbances of skin sensation: Secondary | ICD-10-CM | POA: Diagnosis not present

## 2020-11-26 DIAGNOSIS — M6281 Muscle weakness (generalized): Secondary | ICD-10-CM

## 2020-11-26 DIAGNOSIS — R2689 Other abnormalities of gait and mobility: Secondary | ICD-10-CM

## 2020-11-26 NOTE — Therapy (Signed)
[facility] [address] [place], [place], [address] Phone: [phone]   Fax:  [phone]  Physical Therapy Treatment  Patient Details  Name: Jane Martin MRN: [id number] Date of Birth: 09/23/1995 Referring Provider (PT): Deatra Ina, PA-C (to see Raulkar)   Encounter Date: 11/26/2020   PT End of Session - 11/26/20 1624    Visit Number 2    Number of Visits 18    Authorization Type UMR/Medcost    PT Start Time 1622    PT Stop Time 1702    PT Time Calculation (min) 40 min    Activity Tolerance Patient tolerated treatment well    Behavior During Therapy [facility] for tasks assessed/performed           History reviewed. No pertinent past medical history.  Past Surgical History:  Procedure Laterality Date  . CRANIOTOMY Left 10/17/2020   Procedure: CRANIOTOMY HEMATOMA Evacuation;  Surgeon: Barnett Abu, MD;  Location: [facility] OR;  Service: Neurosurgery;  Laterality: Left;  . IR ANGIO INTRA EXTRACRAN SEL COM CAROTID INNOMINATE BILAT MOD SED  10/29/2020  . IR ANGIO VERTEBRAL SEL VERTEBRAL BILAT MOD SED  10/29/2020  . IR US GUIDE VASC ACCESS RIGHT  10/29/2020    There were no vitals filed for this visit.   Subjective Assessment - 11/26/20 1624    Subjective No changes, just still numb.    Patient is accompained by: --    Pertinent History No significant PMH    Patient Stated Goals Pt's goal for therapy are for RLE to stop being numb and get back to normal.    Currently in Pain? No/denies    Pain Onset 1 to 4 weeks ago              [facility] PT Assessment - 11/26/20 0001      6 Minute Walk- Baseline   6 Minute Walk- Baseline yes    BP (mmHg) 98/72    HR (bpm) 79    02 Sat (%RA) 100 %      6 Minute walk- Post Test   6 Minute Walk Post Test yes    BP (mmHg) 106/78    HR (bpm) 78    02 Sat (%RA) 100 %    Modified Borg Scale for Dyspnea 0- Nothing at all    Perceived Rate of Exertion (Borg) 10-      6 minute  walk test results    Aerobic Endurance Distance Walked 1589    Endurance additional comments Normal 1765 (20-69 yo)                  Access Code: [email or web address] URL: [email or web address] Date: 11/26/2020 Prepared by: Lonia Blood  Exercises-Performed in session today 10 reps each, then added to HEP Sit to stand in stride stance - 1 x daily - 5 x weekly - 2-3 sets - 10 reps Wall Quarter Squat - 1 x daily - 5 x weekly - 2-3 sets - 10 reps Standing Forward Step Taps with Counter Support - 1 x daily - 5 x weekly - 2-3 sets - 10 reps Forward Step Up - 1 x daily - 5 x weekly - 2-3 sets - 10 reps (R leg leading)        [facility] Adult PT Treatment/Exercise - 11/26/20 0001      Exercises   Exercises Ankle;Knee/Hip      Knee/Hip Exercises: Aerobic   Stepper Seated SciFit Stepper, Level 1.5>2, lower extremities only, x 8 minutes,  80-108 steps/minute, cues to attend to R side, with pt verbalizing she is paying attention to keeping R foot fully on foot plate.      Ankle Exercises: Standing   Heel Raises Right;Both;10 reps;Limitations    Heel Raises Limitations 10 reps bilateral; then 10 reps RLE only    Toe Raise 10 reps;Limitations    Toe Raise Limitations Bilateral                  PT Education - 11/26/20 1709    Education Details HEP initiated-see instructions    Person(s) Educated Patient    Methods Explanation;Demonstration;Verbal cues;Handout    Comprehension Verbalized understanding;Returned demonstration;Verbal cues required            PT Short Term Goals - 11/26/20 1713      PT SHORT TERM GOAL #1   Title Pt will be independent with HEP for improved strength, balance, and gait.  TARGET 12/26/2020    Time 5    Period Weeks    Status New      PT SHORT TERM GOAL #2   Title Pt will perform 5x sit<>stand in less than or equal to 14 sec for improved transfer efficiency and functional strength.    Baseline 17.09    Time 5    Period Weeks    Status  New      PT SHORT TERM GOAL #3   Title Pt will improve single limb stance on RLE to at least 10 sec for improved balance, functional strength.    Baseline 3.5 sec    Time 5    Period Weeks    Status New      PT SHORT TERM GOAL #4   Title Pt/family will verbalize understanding of fall prevention in home environment.    Time 5    Period Weeks    Status New      PT SHORT TERM GOAL #5   Title 6 minute walk test to improve to at least 1700 ft for improved gait efficiency over longer distances.    Baseline 1589 ft    Time 5    Period Weeks             PT Long Term Goals - 11/24/20 1518      PT LONG TERM GOAL #1   Title Pt will be independent with progression of HEP for improved strength, balance, and gait.  TARGET 01/23/2021    Time 9    Period Weeks    Status New      PT LONG TERM GOAL #2   Title Pt will improve 5x sit<>stand to less than or equal to 12 sec to demonstrate improved functional strength and transfer efficiency.    Time 9    Period Weeks    Status New      PT LONG TERM GOAL #3   Title Pt will improve FGA score to at least 24/30 to decrease fall risk.    Baseline 19/30    Time 9    Period Weeks    Status New      PT LONG TERM GOAL #4   Title Pt will improve gait velocity to at least 4 ft/sec for improved gait efficiency and safety.    Baseline 3.28 ft/sec (pt reports always being a fast walker)    Time 9    Period Weeks    Status New      PT LONG TERM GOAL #5   Title Pt will ambulate  at least 1000 ft, indoor and outdoor surfaces, no LOB, independently, for improved community gait.    Time 9    Period Weeks    Status New      Additional Long Term Goals   Additional Long Term Goals Yes      PT LONG TERM GOAL #6   Title Pt will improve FOTO to at least 81% (predicted FOTO at d/c) to demo improved overall functional mobility.    Baseline 68%    Time 9    Period Weeks    Status New                 Plan - 11/26/20 1710    Clinical  Impression Statement Initiated HEP, primarily focused on RLE weightbearing for NMR.  Occasional cues for RLE foot placement, and on SciFit stepper, lower extremities only, pt able to demo attending to RLE keeping it fully on footplate throughout.  6 MWT completed with 1589 ft total less than normal for healthy adult (1765 ft).  Pt will continue to benefit from skilled PT address strength, NMR to RLE for improved balance and gait.    Personal Factors and Comorbidities Other   No pertient PMH; severity of deficits, including aphasia   Examination-Activity Limitations Locomotion Level;Transfers;Stairs    Examination-Participation Restrictions Occupation;Community Activity;Driving    Stability/Clinical Decision Making Evolving/Moderate complexity    Rehab Potential Good    PT Frequency 2x / week   2x/wk x 8 weeks   PT Duration Other (comment)   plus 1x/wk on eval week; 9 week POC   PT Treatment/Interventions ADLs/Self Care Home Management;Gait training;Stair training;Functional mobility training;Therapeutic activities;Therapeutic exercise;Balance training;Neuromuscular re-education;Electrical Stimulation;Patient/family education;Manual techniques;Passive range of motion    PT Next Visit Plan REview HEP to address RLE strength, weightbearing, NMR; gait training, step ups, core/hip stability; work with eyes closed for balance, compliant surfaces    Consulted and Agree with Plan of Care Patient           Patient will benefit from skilled therapeutic intervention in order to improve the following deficits and impairments:  Abnormal gait,Difficulty walking,Decreased balance,Impaired sensation,Decreased strength,Decreased mobility  Visit Diagnosis: Muscle weakness (generalized)  Unsteadiness on feet  Other abnormalities of gait and mobility     Problem List Patient Active Problem List   Diagnosis Date Noted  . Acute blood loss anemia 11/10/2020  . Abnormal LFTs 11/10/2020  . Aphasia 10/30/2020   . 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 Encephalopathy acute   . ICH (intracerebral hemorrhage) (HCC) 10/17/2020    Sumner Kirchman W. 11/26/2020, 5:15 PM  Gean Maidens., PT   [facility] [address] [place], [place], [address] Phone: [phone]   Fax:  [phone]  Name: Jane Martin MRN: [id number] Date of Birth: 1995/08/13

## 2020-11-26 NOTE — Patient Instructions (Signed)

## 2020-11-26 NOTE — Therapy (Signed)
[facility] [address] [place], [place], [address] Phone: [phone]   Fax:  [phone]  Speech Language Pathology Treatment  Patient Details  Name: Jane Martin MRN: [id number] Date of Birth: 27-Nov-1995 Referring Provider (SLP): Dr. Sula Soda   Encounter Date: 11/26/2020   End of Session - 11/26/20 1523    Visit Number 2    Number of Visits 25    Date for SLP Re-Evaluation 02/16/21    Authorization Type no auth; 130 VL PT/OT/ST combined    SLP Start Time 1530    SLP Stop Time  1615    SLP Time Calculation (min) 45 min    Activity Tolerance Patient tolerated treatment well           History reviewed. No pertinent past medical history.  Past Surgical History:  Procedure Laterality Date  . CRANIOTOMY Left 10/17/2020   Procedure: CRANIOTOMY HEMATOMA Evacuation;  Surgeon: Barnett Abu, MD;  Location: [facility] OR;  Service: Neurosurgery;  Laterality: Left;  . IR ANGIO INTRA EXTRACRAN SEL COM CAROTID INNOMINATE BILAT MOD SED  10/29/2020  . IR ANGIO VERTEBRAL SEL VERTEBRAL BILAT MOD SED  10/29/2020  . IR US GUIDE VASC ACCESS RIGHT  10/29/2020    There were no vitals filed for this visit.   Subjective Assessment - 11/26/20 1532    Subjective "I'm having trouble reading and texting"    Currently in Pain? No/denies                 ADULT SLP TREATMENT - 11/26/20 1522      General Information   Behavior/Cognition Alert;Cooperative;Pleasant mood      Treatment Provided   Treatment provided Cognitive-Linquistic      Cognitive-Linquistic Treatment   Treatment focused on Aphasia;Patient/family/caregiver education    Skilled Treatment In opening conversation, pt exhibited occasional empty speech as well as intermittent semantic and phonemic paraphasias (ex: cravel for travel). Pt able to self-correct errors occasionally with additional processing time. Pt reports confusion when reading/texting related to  both word finding and comprehension. SLP targeted naming items in personally relevant categories. Pt able to name 10 items with occasional additional processing time. Occasional self-correction for paraphasias exhibited. SLP had patient write items for 1 of 2 categories with usual cues required for spelling errors. SLP introduced VNeST this session, in which usual min cues required to name "who" and rare min cues required for "why/when/where" to clarify targeted response.      Assessment / Recommendations / Plan   Plan Continue with current plan of care      Progression Toward Goals   Progression toward goals Progressing toward goals            SLP Education - 11/26/20 1630    Education Details word finding compensations, VNeST    Person(s) Educated Patient    Methods Explanation;Demonstration;Handout    Comprehension Verbalized understanding;Returned demonstration;Need further instruction            SLP Short Term Goals - 11/26/20 1523      SLP SHORT TERM GOAL #1   Title Pt will name 10 items in persoanlly relevant category with rare min A over 2 sessions    Time 6    Period Weeks    Status On-going      SLP SHORT TERM GOAL #2   Title Pt will utilize 2 compensatory strategies for aphasia in structured language tasks 8/10 opportunities with occasional min A over 2 sessions  Time 6    Period Weeks    Status On-going      SLP SHORT TERM GOAL #3   Title Pt will comprehend 3 sentence paragraph with rare min A over 2 sessions    Time 6    Period Weeks    Status On-going      SLP SHORT TERM GOAL #4   Title Pt will write at sentence level, IDing errors with occasional min 8/10 errors    Time 6    Period Weeks    Status On-going      SLP SHORT TERM GOAL #5   Title Pt will carryover compensations for aphasia during 12 minute simple conversation as needed with occasional min A    Time 6    Period Weeks    Status On-going            SLP Long Term Goals - 11/26/20 1524       SLP LONG TERM GOAL #1   Title Pt will complete complex naming tasks with 90% accuracy and occasional min A over 2 sessions    Time 12    Period Weeks    Status On-going      SLP LONG TERM GOAL #2   Title Pt will comprehend 3 paragraph passages of interest to her, restating 2 salient details from each paragraph with occasional min A    Time 12    Period Weeks    Status On-going      SLP LONG TERM GOAL #3   Title Pt will ID and correct errors on texts, e mails and other 4 sentence written communications 9/10 with occasional min A    Time 12    Period Weeks    Status On-going      SLP LONG TERM GOAL #4   Title Pt will carryover compensations for aphasia as needed over 20 minute moderately complex conversation with occasional min A over 2 sessions    Time 12    Period Weeks    Status On-going      SLP LONG TERM GOAL #5   Title Pt will improve score on The Communicative Participation Item Bank by 4 points    Time 12    Period Weeks    Status On-going            Plan - 11/26/20 1630    Clinical Impression Statement Paityn Balsam is referred to outpt ST s/p Left ICH due to aphasia. SLP introduced word finding strategies and VNeST this session. Occasional paraphasias and intermittent reduced comprehension exhibited this session. Pt intermittently benefits from additional processing time to correct errors. At this time, Marlaina can not return to her previous job due to aphasia. I recommend skilled ST to maximize communication for safety, effective and efficient participation in conversations and for possible return to the workforce.    Speech Therapy Frequency 2x / week    Duration 12 weeks   or 25 visits   Treatment/Interventions SLP instruction and feedback;Environmental controls;Language facilitation;Cueing hierarchy;Compensatory strategies;Functional tasks;Cognitive reorganization;Compensatory techniques;Patient/family education;Multimodal communcation  approach;Internal/external aids    Potential to Achieve Goals Good    SLP Home Exercise Plan provided    Consulted and Agree with Plan of Care Patient           Patient will benefit from skilled therapeutic intervention in order to improve the following deficits and impairments:   Aphasia    Problem List Patient Active Problem List   Diagnosis Date Noted  . Acute  blood loss anemia 11/10/2020  . Abnormal LFTs 11/10/2020  . Aphasia 10/30/2020  . 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 Encephalopathy acute   . ICH (intracerebral hemorrhage) (HCC) 10/17/2020    Janann Colonel, MA CCC-SLP 11/26/2020, 4:32 PM  [facility] [address] [place], [place], [address] Phone: [phone]   Fax:  [phone]   Name: CHERESA SIERS MRN: [id number] Date of Birth: [birth year]

## 2020-11-26 NOTE — Patient Instructions (Signed)
Access Code: [email or web address] URL: [email or web address] Date: 11/26/2020 Prepared by: Lonia Blood  Exercises Sit to stand in stride stance - 1 x daily - 5 x weekly - 2-3 sets - 10 reps Wall Quarter Squat - 1 x daily - 5 x weekly - 2-3 sets - 10 reps Standing Forward Step Taps with Counter Support - 1 x daily - 5 x weekly - 2-3 sets - 10 reps Forward Step Up - 1 x daily - 5 x weekly - 2-3 sets - 10 reps

## 2020-12-02 ENCOUNTER — Other Ambulatory Visit: Payer: Self-pay | Admitting: *Deleted

## 2020-12-02 NOTE — Patient Outreach (Signed)
[facility]) Care Management  12/02/2020  Jane Martin 1996-05-04 [id number]   Transition of care follow up call   Referral received:10/18/20 Initial outreach:11/07/20 Insurance: [employer]  Subjective: Unsuccessful outreach call to patient mother to follow up on scheduling post discharge follow up with neurosurgeon, voicemail box full unable to leave a message.  Placed call to patient contact number on answer able to leave a HIPAA compliant voicemail message for return call.   [phone]  Placed return call to patient ,explained reason for follow up call, she was able to place her mother Jane Martin on the phone.  Jane Martin again explains that she has received phone calls/voicemail messages  from Dr. Danielle Dess office attempting to arrange post discharge appointment. Assisted patient mother with 3 way call to Dr. Danielle Dess office able to leave a message on Nurse Aram Beecham voicemail regarding need to arrange post discharge visit, provided patient contact number and explains that she will be able to get her mother on the phone to arrange appointment.  Patient mother discussed that she may possibly go by office during one of patient therapy sessions to arrange appointment.  Again discussed arranging PCP follow up provider for [facility], patient mother has been  provided [facility] find a doctor contact information   [facility]: [facility] transferred to inpatient rehab 4/28-5/5 for Intracerebral hemorrhage, s/p craniotomy for hematoma, aphasia, right hemiparesisComorbidities include:  Shewas discharged to home on5/5/22without the need for home health services, referral to neuro rehab outpatient services for PT/OT/ST services, patient provided DME of shower seat at discharge..   Plan Will plan return call in the next week.   Egbert Garibaldi, RN, BSN  [facility] Care Management,Care Management Coordinator   [phone]- Mobile [phone]- Toll Free Main Office

## 2020-12-03 ENCOUNTER — Other Ambulatory Visit: Payer: Self-pay

## 2020-12-03 ENCOUNTER — Ambulatory Visit: Payer: PRIVATE HEALTH INSURANCE | Admitting: Speech Pathology

## 2020-12-03 ENCOUNTER — Other Ambulatory Visit: Payer: Self-pay | Admitting: *Deleted

## 2020-12-03 ENCOUNTER — Telehealth: Payer: Self-pay | Admitting: Psychology

## 2020-12-03 ENCOUNTER — Encounter: Payer: Self-pay | Admitting: Speech Pathology

## 2020-12-03 ENCOUNTER — Ambulatory Visit: Payer: PRIVATE HEALTH INSURANCE

## 2020-12-03 ENCOUNTER — Ambulatory Visit: Payer: PRIVATE HEALTH INSURANCE | Admitting: Occupational Therapy

## 2020-12-03 DIAGNOSIS — R278 Other lack of coordination: Secondary | ICD-10-CM

## 2020-12-03 DIAGNOSIS — R4701 Aphasia: Secondary | ICD-10-CM | POA: Insufficient documentation

## 2020-12-03 DIAGNOSIS — R208 Other disturbances of skin sensation: Secondary | ICD-10-CM

## 2020-12-03 DIAGNOSIS — R2689 Other abnormalities of gait and mobility: Secondary | ICD-10-CM | POA: Insufficient documentation

## 2020-12-03 DIAGNOSIS — R41844 Frontal lobe and executive function deficit: Secondary | ICD-10-CM

## 2020-12-03 DIAGNOSIS — I69151 Hemiplegia and hemiparesis following nontraumatic intracerebral hemorrhage affecting right dominant side: Secondary | ICD-10-CM

## 2020-12-03 DIAGNOSIS — M6281 Muscle weakness (generalized): Secondary | ICD-10-CM

## 2020-12-03 DIAGNOSIS — I69390 Apraxia following cerebral infarction: Secondary | ICD-10-CM | POA: Diagnosis not present

## 2020-12-03 DIAGNOSIS — R41840 Attention and concentration deficit: Secondary | ICD-10-CM | POA: Insufficient documentation

## 2020-12-03 DIAGNOSIS — R2681 Unsteadiness on feet: Secondary | ICD-10-CM | POA: Insufficient documentation

## 2020-12-03 DIAGNOSIS — I612 Nontraumatic intracerebral hemorrhage in hemisphere, unspecified: Secondary | ICD-10-CM | POA: Diagnosis not present

## 2020-12-03 NOTE — Therapy (Signed)
[facility] [address] [place], [place], [address] Phone: [phone]   Fax:  [phone]  Occupational Therapy Treatment  Patient Details  Name: Jane Martin MRN: [id number] Date of Birth: 1995-11-13 Referring Provider (OT): f/u w Ihor Austin, NP   Encounter Date: 12/03/2020   OT End of Session - 12/03/20 1535    Visit Number 2    Number of Visits 25    Date for OT Re-Evaluation 02/16/21    Authorization Type [facility]    Authorization Time Period VL:MN    OT Start Time 0718    OT Stop Time 0800    OT Time Calculation (min) 42 min    Activity Tolerance Patient tolerated treatment well    Behavior During Therapy [facility] for tasks assessed/performed           No past medical history on file.  Past Surgical History:  Procedure Laterality Date  . CRANIOTOMY Left 10/17/2020   Procedure: CRANIOTOMY HEMATOMA Evacuation;  Surgeon: Barnett Abu, MD;  Location: [facility] OR;  Service: Neurosurgery;  Laterality: Left;  . IR ANGIO INTRA EXTRACRAN SEL COM CAROTID INNOMINATE BILAT MOD SED  10/29/2020  . IR ANGIO VERTEBRAL SEL VERTEBRAL BILAT MOD SED  10/29/2020  . IR US GUIDE VASC ACCESS RIGHT  10/29/2020    There were no vitals filed for this visit.   Subjective Assessment - 12/03/20 1531    Subjective  Pt reports being discouraged about lack of sensation    Limitations Aphasia (expressive and receptive), Absent sensation on R hemi    Patient Stated Goals "to be able to feel myself, do for myself, drive"    Currently in Pain? Yes    Pain Score 4     Pain Location Head    Pain Orientation Left    Pain Descriptors / Indicators Aching    Pain Type Acute pain    Pain Onset 1 to 4 weeks ago    Pain Frequency Intermittent    Aggravating Factors  unknown    Pain Relieving Factors tylenol                 Treatment: Pt appeared frustrated and down at end of session regarding sensory deficits.               OT Education - 12/03/20 1534    Education Details coordination HEP-see pt instructions, tracing activity perfromed for shapes and capital letters, mod v.c for hand postion with highlighter.    Person(s) Educated Patient    Methods Explanation;Verbal cues;Handout;Demonstration    Comprehension Verbalized understanding;Returned demonstration;Verbal cues required            OT Short Term Goals - 11/24/20 1450      OT SHORT TERM GOAL #1   Title Pt will be independent with HEP for RUE coordination and grip strength    Time 4    Period Weeks    Status New    Target Date 12/22/20      OT SHORT TERM GOAL #2   Title Pt will verbalize understanding of sensory strategies for increased safety awareness    Time 4    Period Weeks    Status New      OT SHORT TERM GOAL #3   Title Pt will perform environmental scanning in moderately distracting environment with 90% accuracy or greater.    Time 4    Period Weeks    Status New      OT  SHORT TERM GOAL #4   Title Pt will improve box and blocks score by 4 blocks or more with RUE for increase in overall functional use of RUE.    Baseline R 21 L 51    Time 4    Period Weeks    Status New      OT SHORT TERM GOAL #5   Title Pt will perform simple warm meal prep with supervision and good safety awareness    Time 4    Period Weeks    Status New             OT Long Term Goals - 11/24/20 1710      OT LONG TERM GOAL #1   Title Pt will be independent with updated HEPs    Time 12    Period Weeks    Status New    Target Date 02/16/21      OT LONG TERM GOAL #2   Title Pt will increase grip strength in RUE by 8 lbs or greater for increasein functional use of RUE, dominant, hand.    Baseline R 24.6, L 42.1    Time 12    Period Weeks    Status New      OT LONG TERM GOAL #3   Title Pt will complete 9 hole peg test in 2 minutes or less with RUE for increase in fine motor coordination    Baseline R 8 pegs in 2 min, 30 seconds L 24.63s     Time 12    Period Weeks    Status New      OT LONG TERM GOAL #4   Title Pt will complete physical and cognitive task simultaneously with 90% accuracy or greater    Time 12    Period Weeks    Status New      OT LONG TERM GOAL #5   Title Pt will demonstrate or report increased ease with styling her own hair with bilateral UE.    Time 12    Period Weeks    Status New      Long Term Additional Goals   Additional Long Term Goals Yes      OT LONG TERM GOAL #6   Title Pt and caregiver will verbalize undersatnding for return to driving and return to work recommendations.    Time 12    Period Weeks    Status New      OT LONG TERM GOAL #7   Title Pt will score a 57 or greater on FOTO at discharge.    Baseline 38    Time 12    Period Weeks    Status New                 Plan - 12/03/20 1533    Clinical Impression Statement Pt is progressing towards goals. she demonstrates understanding of inital coordination HEP. Pt is discouraged regarding lack of sensation.    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 ADL;Decreased knowledge of use of DME;Strength;Dexterity;GMC;Proprioception;UE functional use;ROM;IADL;Vision;Decreased knowledge of precautions;FMC;Sensation;Coordination    Cognitive Skills Problem Solve;Understand;Perception;Thought;Sequencing    Rehab Potential Good    Clinical Decision Making Limited treatment options, no task modification necessary    Comorbidities Affecting Occupational Performance: None    Modification or Assistance to Complete  Evaluation  No modification of tasks or assist necessary to complete eval    OT Frequency 2x / week    OT Duration 12 weeks    OT Treatment/Interventions Self-care/ADL training;Fluidtherapy;DME  and/or AE instruction;Therapeutic activities;Cognitive remediation/compensation;Visual/perceptual remediation/compensation;Patient/family education;Passive range of motion;Manual Therapy;Energy conservation;Electrical Stimulation;Neuromuscular education;Building services engineer;Therapeutic exercise    Plan functional use of RUE, provide positive reinforcement - pt easily discouraged    Consulted and Agree with Plan of Care Patient    Family Member Consulted mom, Thelma           Patient will benefit from skilled therapeutic intervention in order to improve the following deficits and impairments:   Body Structure / Function / Physical Skills: ADL,Decreased knowledge of use of DME,Strength,Dexterity,GMC,Proprioception,UE functional use,ROM,IADL,Vision,Decreased knowledge of precautions,FMC,Sensation,Coordination Cognitive Skills: Problem Solve,Understand,Perception,Thought,Sequencing     Visit Diagnosis: 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 Acute blood loss anemia 11/10/2020  . Abnormal LFTs 11/10/2020  . Aphasia 10/30/2020  . 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 Encephalopathy acute   . ICH (intracerebral hemorrhage) (HCC) 10/17/2020    Cheria Sadiq 12/03/2020, 3:39 PM  [facility] [address] [place], [place], [address] Phone: [phone]   Fax:  [phone]  Name: ZHAVIA CUNANAN MRN: [id number] Date of Birth: [birth year]

## 2020-12-03 NOTE — Patient Outreach (Signed)
[facility] Care Management  12/03/2020  FERRELL FLAM [birth year] [id number]   Care Coordination    Referral received:10/18/20 Initial outreach:11/07/20 [facility]: [facility]   Subjective; Received the following inbasket message from, Clinton Sawyer, SLP, patient at outpatient therapy.   Jethro Poling,  I'm Jane Martin's speech therapist. She came in today tearful, stating she wanted to die. She reports feeling helpless and depressed. She feels "nothing is working." I am wondering if she could have a referral to neurospysch for coping and consider medication for depression. She saw Dr. Kieth Brightly in the hospital. She did deny any potential for self harm. She doesn't see her rehab doctor until 01/20/21.   Thank you.   Placed call to Rehab doctor office spoke with representative to share above communication regarding patient, discussed referral to neuropsych   Clinton Sawyer SLP.  Received return call from Robin at Rehab MD office she states that they have contacted patient at numbers listed in Epic.and able to leave a message.  regarding arranging appointment with Dr. Kieth Brightly on today as well as sooner rehab MD appointment.   Outreach call to patient Mother Jane Martin to communicate above, she states patient was a little upset going into therapy visit on today. Encouraged to call Rehab MD office number provided to arrange office visit , she is agreeable. Stressed the importance of follow with providers.  Patient mother also states they have been able to make an appointment with Dr. Danielle Dess for 12/05/20.   Plan Will keep scheduled follow up call in the next week, patient mother is agreeable.   Egbert Garibaldi, RN, BSN  [facility] Care Management,Care Management Coordinator  [phone]- Mobile [phone]- Toll Free Main Office

## 2020-12-03 NOTE — Patient Instructions (Signed)
   Instagram sites:  Stroke support  Aphasia Recovery Connection  Stroke Survivors never quit  Aphasia support groups on FB  Patience Listening and Communicating with Aphasia Patients on YouTube [email or web address].rivera.net/  [facility] [facility] [place]  You look good - people won't understand what you are going through and may forget

## 2020-12-03 NOTE — Patient Instructions (Signed)
  Coordination Activities  Perform the following activities for 20 minutes 1 times per day with right hand(s).   Rotate ball in fingertips (clockwise and counter-clockwise).  Flip cards 1 at a time as fast as you can.  Deal cards with your thumb (Hold deck in hand and push card off top with thumb).  Pick up coins and place in container or coin bank.  Pick up coins and stack.  Practice writing and/or typing.

## 2020-12-03 NOTE — Telephone Encounter (Signed)
lvm to contact our office

## 2020-12-03 NOTE — Therapy (Signed)
[facility] [address] [place], [place], [address] Phone: [phone]   Fax:  [phone]  Speech Language Pathology Treatment  Patient Details  Name: Jane Martin MRN: [id number] Date of Birth: 10-31-1995 Referring Provider (SLP): Dr. Sula Soda   Encounter Date: 12/03/2020   End of Session - 12/03/20 1004    Visit Number 3    Number of Visits 25    Date for SLP Re-Evaluation 02/16/21    Authorization Type no auth; 130 VL PT/OT/ST combined    SLP Start Time 0846    SLP Stop Time  0932    SLP Time Calculation (min) 46 min    Activity Tolerance Other (comment)   tearful          History reviewed. No pertinent past medical history.  Past Surgical History:  Procedure Laterality Date  . CRANIOTOMY Left 10/17/2020   Procedure: CRANIOTOMY HEMATOMA Evacuation;  Surgeon: Barnett Abu, MD;  Location: [facility] OR;  Service: Neurosurgery;  Laterality: Left;  . IR ANGIO INTRA EXTRACRAN SEL COM CAROTID INNOMINATE BILAT MOD SED  10/29/2020  . IR ANGIO VERTEBRAL SEL VERTEBRAL BILAT MOD SED  10/29/2020  . IR US GUIDE VASC ACCESS RIGHT  10/29/2020    There were no vitals filed for this visit.   Subjective Assessment - 12/03/20 0845    Subjective "I did not"    Pain Score 4     Pain Location Head    Pain Orientation Left    Pain Descriptors / Indicators Aching    Pain Type Chronic pain    Pain Onset 1 to 4 weeks ago    Pain Frequency Intermittent    Pain Relieving Factors tylenol                 ADULT SLP TREATMENT - 12/03/20 0849      General Information   Behavior/Cognition Alert;Cooperative;Pleasant mood      Treatment Provided   Treatment provided Cognitive-Linquistic      Cognitive-Linquistic Treatment   Treatment focused on Aphasia;Patient/family/caregiver education    Skilled Treatment Provided Jane Martin aphasia ID cards and information re: [employer] aphasia group and aphasia research opportunity.  Initiated VNeST with 3 verbs. Jane Martin reqiured extended time and occasional min to mod verbal cues and questioning cues and to generate 3 sematnically different subject and objects for each verb (measure, weigh, deliver) and she required extended time and occasional min verbal cues to answer "wh" questions to generate more complex sentence for each verb. Jane Martin with flat affected. She endorsed feeling helpless and depressed. Jane Martin tearful, focusing on numbness of right side and feeling like "nothing helps." Acknowleded her feelings. She denies any potential for self harm.      Assessment / Recommendations / Plan   Plan Continue with current plan of care      Progression Toward Goals   Progression toward goals Progressing toward goals            SLP Education - 12/03/20 1002    Education Details aphasia groups, stroke and aphasia sites on social medial for support, stroke rehab ed    Person(s) Educated Patient    Methods Explanation;Demonstration;Handout    Comprehension Verbalized understanding;Returned demonstration;Verbal cues required            SLP Short Term Goals - 12/03/20 1003      SLP SHORT TERM GOAL #1   Title Pt will name 10 items in persoanlly relevant category with rare min A over  2 sessions    Time 5    Period Weeks    Status On-going      SLP SHORT TERM GOAL #2   Title Pt will utilize 2 compensatory strategies for aphasia in structured language tasks 8/10 opportunities with occasional min A over 2 sessions    Time 5    Period Weeks    Status On-going      SLP SHORT TERM GOAL #3   Title Pt will comprehend 3 sentence paragraph with rare min A over 2 sessions    Time 5    Period Weeks    Status On-going      SLP SHORT TERM GOAL #4   Title Pt will write at sentence level, IDing errors with occasional min 8/10 errors    Time 5    Period Weeks    Status On-going      SLP SHORT TERM GOAL #5   Title Pt will carryover compensations for aphasia during  12 minute simple conversation as needed with occasional min A    Time 5    Period Weeks    Status On-going            SLP Long Term Goals - 12/03/20 1003      SLP LONG TERM GOAL #1   Title Pt will complete complex naming tasks with 90% accuracy and occasional min A over 2 sessions    Time 11    Period Weeks    Status On-going      SLP LONG TERM GOAL #2   Title Pt will comprehend 3 paragraph passages of interest to her, restating 2 salient details from each paragraph with occasional min A    Time 11    Period Weeks    Status On-going      SLP LONG TERM GOAL #3   Title Pt will ID and correct errors on texts, e mails and other 4 sentence written communications 9/10 with occasional min A    Time 11    Period Weeks    Status On-going      SLP LONG TERM GOAL #4   Title Pt will carryover compensations for aphasia as needed over 20 minute moderately complex conversation with occasional min A over 2 sessions    Time 11    Period Weeks    Status On-going      SLP LONG TERM GOAL #5   Title Pt will improve score on The Communicative Participation Item Bank by 4 points    Time 11    Period Weeks    Status On-going            Plan - 12/03/20 1002    Clinical Impression Statement Jane Martin is referred to outpt ST s/p Left ICH due to aphasia. SLP introduced word finding strategies and VNeST this session. Occasional paraphasias and intermittent reduced comprehension exhibited this session. Pt intermittently benefits from additional processing time to correct errors. At this time, Jane Martin can not return to her previous job due to aphasia. I recommend skilled ST to maximize communication for safety, effective and efficient participation in conversations and for possible return to the workforce.    Speech Therapy Frequency 2x / week    Duration 12 weeks   25 visits   Treatment/Interventions SLP instruction and feedback;Environmental controls;Language facilitation;Cueing  hierarchy;Compensatory strategies;Functional tasks;Cognitive reorganization;Compensatory techniques;Patient/family education;Multimodal communcation approach;Internal/external aids    Potential to Achieve Goals Good           Patient will  benefit from skilled therapeutic intervention in order to improve the following deficits and impairments:   Aphasia    Problem List Patient Active Problem List   Diagnosis Date Noted  . Acute blood loss anemia 11/10/2020  . Abnormal LFTs 11/10/2020  . Aphasia 10/30/2020  . 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 Encephalopathy acute   . ICH (intracerebral hemorrhage) (HCC) 10/17/2020    Jane Martin, Jane Journey  MS, CCC-SLP 12/03/2020, 10:05 AM  [facility] [address] [place], [place], [address] Phone: [phone]   Fax:  [phone]   Name: JUDY POLLMAN MRN: [id number] Date of Birth: Oct 29, 1995

## 2020-12-04 ENCOUNTER — Other Ambulatory Visit: Payer: Self-pay | Admitting: Physical Medicine and Rehabilitation

## 2020-12-04 DIAGNOSIS — I612 Nontraumatic intracerebral hemorrhage in hemisphere, unspecified: Secondary | ICD-10-CM

## 2020-12-08 ENCOUNTER — Encounter: Payer: Self-pay | Admitting: Physical Therapy

## 2020-12-08 ENCOUNTER — Encounter: Payer: Self-pay | Admitting: Occupational Therapy

## 2020-12-08 ENCOUNTER — Other Ambulatory Visit: Payer: Self-pay

## 2020-12-08 ENCOUNTER — Encounter: Payer: Self-pay | Admitting: Speech Pathology

## 2020-12-08 ENCOUNTER — Ambulatory Visit: Payer: PRIVATE HEALTH INSURANCE | Admitting: Physical Therapy

## 2020-12-08 ENCOUNTER — Ambulatory Visit: Payer: PRIVATE HEALTH INSURANCE | Admitting: Speech Pathology

## 2020-12-08 ENCOUNTER — Ambulatory Visit: Payer: PRIVATE HEALTH INSURANCE | Admitting: Occupational Therapy

## 2020-12-08 DIAGNOSIS — M6281 Muscle weakness (generalized): Secondary | ICD-10-CM

## 2020-12-08 DIAGNOSIS — I69151 Hemiplegia and hemiparesis following nontraumatic intracerebral hemorrhage affecting right dominant side: Secondary | ICD-10-CM

## 2020-12-08 DIAGNOSIS — I612 Nontraumatic intracerebral hemorrhage in hemisphere, unspecified: Secondary | ICD-10-CM | POA: Diagnosis not present

## 2020-12-08 DIAGNOSIS — R4701 Aphasia: Secondary | ICD-10-CM | POA: Diagnosis not present

## 2020-12-08 DIAGNOSIS — R2689 Other abnormalities of gait and mobility: Secondary | ICD-10-CM

## 2020-12-08 DIAGNOSIS — R41840 Attention and concentration deficit: Secondary | ICD-10-CM

## 2020-12-08 DIAGNOSIS — R2681 Unsteadiness on feet: Secondary | ICD-10-CM

## 2020-12-08 DIAGNOSIS — R208 Other disturbances of skin sensation: Secondary | ICD-10-CM

## 2020-12-08 DIAGNOSIS — R278 Other lack of coordination: Secondary | ICD-10-CM

## 2020-12-08 DIAGNOSIS — R41844 Frontal lobe and executive function deficit: Secondary | ICD-10-CM

## 2020-12-08 DIAGNOSIS — I69390 Apraxia following cerebral infarction: Secondary | ICD-10-CM | POA: Diagnosis not present

## 2020-12-08 NOTE — Therapy (Signed)
[facility] [address] [place], [place], [address] Phone: [phone]   Fax:  [phone]  Physical Therapy Treatment  Patient Details  Name: Jane Martin MRN: [id number] Date of Birth: 15-Jan-1996 Referring Provider (PT): Deatra Inaan Angiulli, PA-C (to see Raulkar)   Encounter Date: 12/08/2020   PT End of Session - 12/08/20 1946    Visit Number 3    Number of Visits 18    Authorization Type UMR/Medcost    PT Start Time 1318    PT Stop Time 1400    PT Time Calculation (min) 42 min    Activity Tolerance Patient tolerated treatment well    Behavior During Therapy [facility] for tasks assessed/performed           History reviewed. No pertinent past medical history.  Past Surgical History:  Procedure Laterality Date  . CRANIOTOMY Left 10/17/2020   Procedure: CRANIOTOMY HEMATOMA Evacuation;  Surgeon: Barnett AbuElsner, Henry, MD;  [facility];  Service: Neurosurgery;  Laterality: Left;  . IR ANGIO INTRA EXTRACRAN SEL COM CAROTID INNOMINATE BILAT MOD SED  10/29/2020  . IR ANGIO VERTEBRAL SEL VERTEBRAL BILAT MOD SED  10/29/2020  . IR US GUIDE VASC ACCESS RIGHT  10/29/2020    There were no vitals filed for this visit.   Subjective Assessment - 12/08/20 1322    Subjective No changes, still numb.  No falls.  No pain.  Exercises going pretty good.    Pertinent History No significant PMH    Patient Stated Goals Pt's goal for therapy are for RLE to stop being numb and get back to normal.    Currently in Pain? No/denies    Pain Onset 1 to 4 weeks ago                       Access Code: [email or web address] URL: [email or web address] Date: 12/08/2020 Prepared by: Lonia BloodAmy Olie Scaffidi  Reviewed exercises given in HEP last visit:  Cues pt to increase to 2-3 sets of 10 reps at home, as pt is performing 10 reps only.  . Sit to stand in stride stance - 1 x daily - 5 x weekly - 2-3 sets - 10 reps (PT provides cues for R foot  placement) . Wall Quarter Squat - 1 x daily - 5 x weekly - 2-3 sets - 10 reps (PT provides cues for foot placement) . Standing Forward Step Taps with Counter Support - 1 x daily - 5 x weekly - 2-3 sets - 10 reps (performed 2nd set with 2# weight RLE) . Forward Step Up - 1 x daily - 5 x weekly - 2-3 sets - 10 reps        [facility] Adult PT Treatment/Exercise - 12/08/20 0001      High Level Balance   High Level Balance Activities Other (comment)    High Level Balance Comments Toe walking, 4 reps along counter, then squat walking, landing on heels, for improved timing/coordination, weightshifting through RLE, 2 reps.  Pt has difficulty, as she is wearing crocs shoes.  PT requests pt wear more supportive shoes next PT visit.      Neuro Re-ed    Neuro Re-ed Details  sit<>stand with RLE posterior, LLE on balance disk, 10 reps with increased weightbearing through RLE.  Forward/back monster walk along counter, 3 reps with squat. Resisted gait 2 laps around gym, with resistance at hips to increase proprioception and sensation to RLE, then 1 lap no resistance.  Knee/Hip Exercises: Standing   Knee Flexion Strengthening;Right;Left;1 set;10 reps    Knee Flexion Limitations RLE 2# weight    Hip Flexion Stengthening;Right;Left;10 reps;Knee straight;Limitations;Knee bent    Hip Flexion Limitations RLE 2# weight    Hip Abduction Stengthening;Both;1 set    Abduction Limitations RLE 2# weight    Hip Extension Stengthening;Both;1 set;Limitations    Extension Limitations RLE 2# weight    Other Standing Knee Exercises Resisted sidestepping at counter, with green band, 3 reps R and L, then resisted sidestep squats with green band, 2 reps R and L      Ankle Exercises: Standing   Heel Raises Right;Both;10 reps;Limitations    Heel Raises Limitations 10 reps bilateral; then 10 reps RLE only    Toe Raise 10 reps;Limitations    Toe Raise Limitations Bilateral    Other Standing Ankle Exercises --                Balance Exercises - 12/08/20 0001      Balance Exercises: Standing   SLS with Vectors Solid surface;Upper extremity assist 1;Limitations    SLS with Vectors Limitations RLE as stance:  6"/12",6" step taps with LLE x 10 reps    Marching Solid surface;Upper extremity assist 2;Upper extremity assist 1;Static;10 reps;Limitations   2# weight RLE              PT Short Term Goals - 11/26/20 1713      PT SHORT TERM GOAL #1   Title Pt will be independent with HEP for improved strength, balance, and gait.  TARGET 12/26/2020    Time 5    Period Weeks    Status New      PT SHORT TERM GOAL #2   Title Pt will perform 5x sit<>stand in less than or equal to 14 sec for improved transfer efficiency and functional strength.    Baseline 17.09    Time 5    Period Weeks    Status New      PT SHORT TERM GOAL #3   Title Pt will improve single limb stance on RLE to at least 10 sec for improved balance, functional strength.    Baseline 3.5 sec    Time 5    Period Weeks    Status New      PT SHORT TERM GOAL #4   Title Pt/family will verbalize understanding of fall prevention in home environment.    Time 5    Period Weeks    Status New      PT SHORT TERM GOAL #5   Title 6 minute walk test to improve to at least 1700 ft for improved gait efficiency over longer distances.    Baseline 1589 ft    Time 5    Period Weeks             PT Long Term Goals - 11/24/20 1518      PT LONG TERM GOAL #1   Title Pt will be independent with progression of HEP for improved strength, balance, and gait.  TARGET 01/23/2021    Time 9    Period Weeks    Status New      PT LONG TERM GOAL #2   Title Pt will improve 5x sit<>stand to less than or equal to 12 sec to demonstrate improved functional strength and transfer efficiency.    Time 9    Period Weeks    Status New      PT LONG TERM GOAL #3  Title Pt will improve FGA score to at least 24/30 to decrease fall risk.    Baseline 19/30     Time 9    Period Weeks    Status New      PT LONG TERM GOAL #4   Title Pt will improve gait velocity to at least 4 ft/sec for improved gait efficiency and safety.    Baseline 3.28 ft/sec (pt reports always being a fast walker)    Time 9    Period Weeks    Status New      PT LONG TERM GOAL #5   Title Pt will ambulate at least 1000 ft, indoor and outdoor surfaces, no LOB, independently, for improved community gait.    Time 9    Period Weeks    Status New      Additional Long Term Goals   Additional Long Term Goals Yes      PT LONG TERM GOAL #6   Title Pt will improve FOTO to at least 81% (predicted FOTO at d/c) to demo improved overall functional mobility.    Baseline 68%    Time 9    Period Weeks    Status New                 Plan - 12/08/20 1947    Clinical Impression Statement Pt only seen once last week, due to scheduled therapist out sick.  Skilled PT session today focused on review of HEP and strengthening/NMR to RLE, including weightbearing activities and weights on RLE for strenghtening/proprioceptive activities.  With standing exercises, pt needs visual cues of mirror and verbal cues to activate abdominals and prevent trunk sway.  Pt wearing crocs today, and PT notes that pt has excess inversion and supination in R foot with balance and standing exercises (requested pt wear more supportive footwear).  Pt will continue to benefit from skilled PT to further address strength, NMR, and gait activities for improved return to independent functional independence.    Personal Factors and Comorbidities Other   No pertient PMH; severity of deficits, including aphasia   Examination-Activity Limitations Locomotion Level;Transfers;Stairs    Examination-Participation Restrictions Occupation;Community Activity;Driving    Stability/Clinical Decision Making Evolving/Moderate complexity    Rehab Potential Good    PT Frequency 2x / week   2x/wk x 8 weeks   PT Duration Other (comment)    plus 1x/wk on eval week; 9 week POC   PT Treatment/Interventions ADLs/Self Care Home Management;Gait training;Stair training;Functional mobility training;Therapeutic activities;Therapeutic exercise;Balance training;Neuromuscular re-education;Electrical Stimulation;Patient/family education;Manual techniques;Passive range of motion    PT Next Visit Plan Try tall kneeling exercises to help pt with increased weightbearing through RLE; add to HEP-theraband/weights for RLE strengthening; core stability, balance on compliant surfaces vision removed    Consulted and Agree with Plan of Care Patient           Patient will benefit from skilled therapeutic intervention in order to improve the following deficits and impairments:  Abnormal gait,Difficulty walking,Decreased balance,Impaired sensation,Decreased strength,Decreased mobility  Visit Diagnosis: Muscle weakness (generalized)  Unsteadiness on feet  Other abnormalities of gait and mobility     Problem List Patient Active Problem List   Diagnosis Date Noted  . Acute blood loss anemia 11/10/2020  . Abnormal LFTs 11/10/2020  . Aphasia 10/30/2020  . 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 Encephalopathy acute   . ICH (intracerebral hemorrhage) (HCC) 10/17/2020    Koran Seabrook W. 12/08/2020, 7:52 PM  Lonia Blood W., PT  [facility] [address] [place], [place], [address] Phone: [phone]   Fax:  [phone]  Name: Jane Martin MRN: [id number] Date of Birth: Nov 12, 1995

## 2020-12-08 NOTE — Therapy (Signed)
[facility] [address] [place], [place], [address] Phone: [phone]   Fax:  [phone]  Occupational Therapy Treatment  Patient Details  Name: Jane Martin MRN: [id number] Date of Birth: Dec 09, 1995 Referring Provider (OT): f/u w Ihor Austin, NP   Encounter Date: 12/08/2020   OT End of Session - 12/08/20 1446    Visit Number 3    Number of Visits 25    Date for OT Re-Evaluation 02/16/21    Authorization Type [facility] UMR    Authorization Time Period VL:MN    OT Start Time 1450    OT Stop Time 1530    OT Time Calculation (min) 40 min    Activity Tolerance Patient tolerated treatment well    Behavior During Therapy [facility] for tasks assessed/performed           History reviewed. No pertinent past medical history.  Past Surgical History:  Procedure Laterality Date  . CRANIOTOMY Left 10/17/2020   Procedure: CRANIOTOMY HEMATOMA Evacuation;  Surgeon: Barnett Abu, MD;  Location: [facility] OR;  Service: Neurosurgery;  Laterality: Left;  . IR ANGIO INTRA EXTRACRAN SEL COM CAROTID INNOMINATE BILAT MOD SED  10/29/2020  . IR ANGIO VERTEBRAL SEL VERTEBRAL BILAT MOD SED  10/29/2020  . IR US GUIDE VASC ACCESS RIGHT  10/29/2020    There were no vitals filed for this visit.   Subjective Assessment - 12/08/20 1450    Subjective  "i'm doing good - still numb"    Pertinent History None    Limitations Aphasia (expressive and receptive), Absent sensation on R hemi    Patient Stated Goals "to be able to feel myself, do for myself, drive"    Currently in Pain? No/denies    Pain Onset --             Went over sensory safety strategies.  Medium Pegs with RUE and copying pattern. Mod difficulty and drops with RUE but did well with coordination and manipulating and rotating pegs despite loss of sensation.   Sensory Reeducation with myofascial red ball on RUE                    OT Education - 12/08/20 1502     Education Details sensory safety strategies via MedBridge.    Person(s) Educated Patient    Methods Explanation;Verbal cues;Handout;Demonstration    Comprehension Verbalized understanding;Returned demonstration;Verbal cues required            OT Short Term Goals - 12/08/20 1452      OT SHORT TERM GOAL #1   Title Pt will be independent with HEP for RUE coordination and grip strength    Time 4    Period Weeks    Status On-going    Target Date 12/22/20      OT SHORT TERM GOAL #2   Title Pt will verbalize understanding of sensory strategies for increased safety awareness    Time 4    Period Weeks    Status On-going   reviewed sensory strategies for safety     OT SHORT TERM GOAL #3   Title Pt will perform environmental scanning in moderately distracting environment with 90% accuracy or greater.    Time 4    Period Weeks    Status New      OT SHORT TERM GOAL #4   Title Pt will improve box and blocks score by 4 blocks or more with RUE for increase in overall functional use of RUE.  Baseline R 21 L 51    Time 4    Period Weeks    Status New      OT SHORT TERM GOAL #5   Title Pt will perform simple warm meal prep with supervision and good safety awareness    Time 4    Period Weeks    Status New             OT Long Term Goals - 11/24/20 1710      OT LONG TERM GOAL #1   Title Pt will be independent with updated HEPs    Time 12    Period Weeks    Status New    Target Date 02/16/21      OT LONG TERM GOAL #2   Title Pt will increase grip strength in RUE by 8 lbs or greater for increasein functional use of RUE, dominant, hand.    Baseline R 24.6, L 42.1    Time 12    Period Weeks    Status New      OT LONG TERM GOAL #3   Title Pt will complete 9 hole peg test in 2 minutes or less with RUE for increase in fine motor coordination    Baseline R 8 pegs in 2 min, 30 seconds L 24.63s    Time 12    Period Weeks    Status New      OT LONG TERM GOAL #4   Title Pt  will complete physical and cognitive task simultaneously with 90% accuracy or greater    Time 12    Period Weeks    Status New      OT LONG TERM GOAL #5   Title Pt will demonstrate or report increased ease with styling her own hair with bilateral UE.    Time 12    Period Weeks    Status New      Long Term Additional Goals   Additional Long Term Goals Yes      OT LONG TERM GOAL #6   Title Pt and caregiver will verbalize undersatnding for return to driving and return to work recommendations.    Time 12    Period Weeks    Status New      OT LONG TERM GOAL #7   Title Pt will score a 57 or greater on FOTO at discharge.    Baseline 38    Time 12    Period Weeks    Status New                 Plan - 12/08/20 1504    Clinical Impression Statement Pt with improved in hand manipulation and coordination with RUE even with loss of sensation. Continue to improve and progress towards goals.    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 ADL;Decreased knowledge of use of DME;Strength;Dexterity;GMC;Proprioception;UE functional use;ROM;IADL;Vision;Decreased knowledge of precautions;FMC;Sensation;Coordination    Cognitive Skills Problem Solve;Understand;Perception;Thought;Sequencing    Rehab Potential Good    Clinical Decision Making Limited treatment options, no task modification necessary    Comorbidities Affecting Occupational Performance: None    Modification or Assistance to Complete Evaluation  No modification of tasks or assist necessary to complete eval    OT Frequency 2x / week    OT Duration 12 weeks  OT Treatment/Interventions Self-care/ADL training;Fluidtherapy;DME and/or AE instruction;Therapeutic activities;Cognitive  remediation/compensation;Visual/perceptual remediation/compensation;Patient/family education;Passive range of motion;Manual Therapy;Energy conservation;Electrical Stimulation;Neuromuscular education;Building services engineer;Therapeutic exercise    Plan functional use of RUE, sensory reeducation/desensitization strategies    Consulted and Agree with Plan of Care Patient    Family Member Consulted mom, Thelma           Patient will benefit from skilled therapeutic intervention in order to improve the following deficits and impairments:   Body Structure / Function / Physical Skills: ADL,Decreased knowledge of use of DME,Strength,Dexterity,GMC,Proprioception,UE functional use,ROM,IADL,Vision,Decreased knowledge of precautions,FMC,Sensation,Coordination Cognitive Skills: Problem Solve,Understand,Perception,Thought,Sequencing     Visit Diagnosis: 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 Acute blood loss anemia 11/10/2020  . Abnormal LFTs 11/10/2020  . Aphasia 10/30/2020  . 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 Encephalopathy acute   . ICH (intracerebral hemorrhage) (HCC) 10/17/2020    Junious Dresser MOT, OTR/L  12/08/2020, 3:14 PM  [facility] [address] [place], [place], [address] Phone: [phone]   Fax:  [phone]  Name: Jane Martin MRN: [id number] Date of Birth: 1996-02-15

## 2020-12-09 ENCOUNTER — Other Ambulatory Visit: Payer: Self-pay | Admitting: *Deleted

## 2020-12-09 NOTE — Patient Outreach (Signed)
[facility]) Care Management  12/09/2020  Jane Martin 1996/04/04 [id number]    Transition of care follow up call  Referral received:10/18/20 Initial outreach:11/07/20 Insurance: [employer]   Subjective  Unsuccessful outreach call to patient, mother [name], able to leave a HIPAA compliant message for return call.  Successful outreach call to [facility] she states she is doing okay therapy making progress. She confirms attending neurosurgeon post discharge visit on 12/03/20. Patient states that he mother is currently at work, briefly discussed previous  Rehab office call to schedule sooner visit and and referral appointment with Psychology, she states she will have her mother return call to me.   Plan Will await return call from [name], if no return call will follow up in the next 4 business days to discuss rehab follow up  and case closure if no new care coordination needs to be addressed.    Egbert Garibaldi, RN, BSN  [facility] Care Management,Care Management Coordinator  [phone]- Mobile [phone]- Toll Free Main Office

## 2020-12-10 ENCOUNTER — Other Ambulatory Visit: Payer: Self-pay | Admitting: *Deleted

## 2020-12-10 NOTE — Therapy (Signed)
[facility] [address] [place], [place], [address] Phone: [phone]   Fax:  [phone]  Speech Language Pathology Treatment  Patient Details  Name: Jane Martin MRN: [id number] Date of Birth: 18-Apr-1996 Referring Provider (SLP): Dr. Sula Soda   Encounter Date: 12/08/2020   End of Session - 12/10/20 0956    Visit Number 4    Number of Visits 25    Date for SLP Re-Evaluation 02/16/21    Authorization Type no auth; 130 VL PT/OT/ST combined    SLP Start Time 1402    SLP Stop Time  1443    SLP Time Calculation (min) 41 min    Activity Tolerance Patient tolerated treatment well           History reviewed. No pertinent past medical history.  Past Surgical History:  Procedure Laterality Date  . CRANIOTOMY Left 10/17/2020   Procedure: CRANIOTOMY HEMATOMA Evacuation;  Surgeon: Barnett Abu, MD;  Location: [facility] OR;  Service: Neurosurgery;  Laterality: Left;  . IR ANGIO INTRA EXTRACRAN SEL COM CAROTID INNOMINATE BILAT MOD SED  10/29/2020  . IR ANGIO VERTEBRAL SEL VERTEBRAL BILAT MOD SED  10/29/2020  . IR US GUIDE VASC ACCESS RIGHT  10/29/2020    There were no vitals filed for this visit.            SLP Education - 12/10/20 0954    Education Details compensations for aphasia    Person(s) Educated Patient    Methods Explanation;Demonstration;Verbal cues    Comprehension Verbalized understanding;Returned demonstration;Verbal cues required;Need further instruction            SLP Short Term Goals - 12/10/20 0955      SLP SHORT TERM GOAL #1   Title Pt will name 10 items in persoanlly relevant category with rare min A over 2 sessions    Time 4    Period Weeks    Status On-going      SLP SHORT TERM GOAL #2   Title Pt will utilize 2 compensatory strategies for aphasia in structured language tasks 8/10 opportunities with occasional min A over 2 sessions    Time 4    Period Weeks    Status On-going       SLP SHORT TERM GOAL #3   Title Pt will comprehend 3 sentence paragraph with rare min A over 2 sessions    Time 4    Period Weeks    Status On-going      SLP SHORT TERM GOAL #4   Title Pt will write at sentence level, IDing errors with occasional min 8/10 errors    Time 4    Period Weeks    Status On-going      SLP SHORT TERM GOAL #5   Title Pt will carryover compensations for aphasia during 12 minute simple conversation as needed with occasional min A    Time 4    Period Weeks    Status On-going            SLP Long Term Goals - 12/10/20 0956      SLP LONG TERM GOAL #1   Title Pt will complete complex naming tasks with 90% accuracy and occasional min A over 2 sessions    Time 10    Period Weeks    Status On-going      SLP LONG TERM GOAL #2   Title Pt will comprehend 3 paragraph passages of interest to her, restating 2 salient details from each  paragraph with occasional min A    Time 10    Period Weeks    Status On-going      SLP LONG TERM GOAL #3   Title Pt will ID and correct errors on texts, e mails and other 4 sentence written communications 9/10 with occasional min A    Time 10    Period Weeks    Status On-going      SLP LONG TERM GOAL #4   Title Pt will carryover compensations for aphasia as needed over 20 minute moderately complex conversation with occasional min A over 2 sessions    Time 10    Period Weeks    Status On-going      SLP LONG TERM GOAL #5   Title Pt will improve score on The Communicative Participation Item Bank by 4 points    Time 10    Period Weeks    Status On-going            Plan - 12/10/20 0955    Clinical Impression Statement Jane Martin is referred to outpt ST s/p Left ICH due to aphasia. SLP introduced word finding strategies and VNeST this session. Occasional paraphasias and intermittent reduced comprehension exhibited this session. Pt intermittently benefits from additional processing time to correct errors. At this  time, Jane Martin can not return to her previous job due to aphasia. I recommend skilled ST to maximize communication for safety, effective and efficient participation in conversations and for possible return to the workforce.    Speech Therapy Frequency 2x / week    Duration 12 weeks   25 visits   Treatment/Interventions SLP instruction and feedback;Environmental controls;Language facilitation;Cueing hierarchy;Compensatory strategies;Functional tasks;Cognitive reorganization;Compensatory techniques;Patient/family education;Multimodal communcation approach;Internal/external aids    Potential to Achieve Goals Good           Patient will benefit from skilled therapeutic intervention in order to improve the following deficits and impairments:   Aphasia    Problem List Patient Active Problem List   Diagnosis Date Noted  . Acute blood loss anemia 11/10/2020  . Abnormal LFTs 11/10/2020  . Aphasia 10/30/2020  . 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 Encephalopathy acute   . ICH (intracerebral hemorrhage) (HCC) 10/17/2020    Jane Martin, Radene Journey MS, CCC-SLP 12/10/2020, 9:57 AM  [facility] [address] [place], [place], [address] Phone: [phone]   Fax:  [phone]   Name: Jane Martin MRN: [id number] Date of Birth: 1996/01/16

## 2020-12-10 NOTE — Patient Outreach (Signed)
[facility]) Care Management  12/10/2020  MEGHAN WARSHAWSKY 09/08/1995 [id number]   Transition of care follow up  Referral received: 10/18/20 Initial outreach:11/07/20 Insurance: [employer]    Subjective Incoming call from patient mother Oscar La from outreach call on 6/7. Rosey Bath reports patient attended appointment with Dr. Danielle Dess and scheduled next follow up, discussed patient is still in the healing process after recent event and it will just take time ,  emphasized no smoking , drinking, driving .  Patient mother states Jane Martin is working on exercises at home and trying to work with her weak right side.  Discussed with patient mother outreach attempts from Neuropsychology to arrange appointment with Dr. Kieth Brightly, she was agreeable to 3 way call to office to schedule appointment at this time.  Appointment scheduled for 6/13 0900.   Patient mother denies any new concerns, she discussed plans to assist with new establishing with a PCP for patient has contact information. She is aware of all upcoming appointments for [facility].    Plan Will close case to [facility] care management at this time no new care management needs identified. Encouraged to notify CM if new concerns .  Egbert Garibaldi, RN, BSN  [facility] Care Management,Care Management Coordinator  [phone]- Mobile [phone]- Toll Free Main Office    Egbert Garibaldi, [place], BSN  [facility] Care Management,Care Management Coordinator  [phone]- Mobile [phone]- Toll Free Main Office

## 2020-12-12 ENCOUNTER — Ambulatory Visit: Payer: PRIVATE HEALTH INSURANCE | Admitting: Physical Therapy

## 2020-12-12 ENCOUNTER — Ambulatory Visit: Payer: PRIVATE HEALTH INSURANCE | Admitting: Occupational Therapy

## 2020-12-12 ENCOUNTER — Ambulatory Visit: Payer: Self-pay | Admitting: *Deleted

## 2020-12-12 ENCOUNTER — Ambulatory Visit: Payer: PRIVATE HEALTH INSURANCE

## 2020-12-15 ENCOUNTER — Ambulatory Visit: Payer: PRIVATE HEALTH INSURANCE

## 2020-12-15 ENCOUNTER — Encounter: Payer: PRIVATE HEALTH INSURANCE | Attending: Psychology | Admitting: Psychology

## 2020-12-15 ENCOUNTER — Ambulatory Visit: Payer: PRIVATE HEALTH INSURANCE | Admitting: Physical Therapy

## 2020-12-15 ENCOUNTER — Encounter: Payer: Self-pay | Admitting: Psychology

## 2020-12-15 ENCOUNTER — Encounter: Payer: Self-pay | Admitting: Occupational Therapy

## 2020-12-15 ENCOUNTER — Encounter: Payer: PRIVATE HEALTH INSURANCE | Admitting: Psychology

## 2020-12-15 ENCOUNTER — Ambulatory Visit: Payer: PRIVATE HEALTH INSURANCE | Admitting: Occupational Therapy

## 2020-12-15 ENCOUNTER — Other Ambulatory Visit: Payer: Self-pay

## 2020-12-15 DIAGNOSIS — I69390 Apraxia following cerebral infarction: Secondary | ICD-10-CM | POA: Insufficient documentation

## 2020-12-15 DIAGNOSIS — I612 Nontraumatic intracerebral hemorrhage in hemisphere, unspecified: Secondary | ICD-10-CM | POA: Diagnosis not present

## 2020-12-15 DIAGNOSIS — R4701 Aphasia: Secondary | ICD-10-CM | POA: Insufficient documentation

## 2020-12-15 DIAGNOSIS — M6281 Muscle weakness (generalized): Secondary | ICD-10-CM

## 2020-12-15 DIAGNOSIS — R41844 Frontal lobe and executive function deficit: Secondary | ICD-10-CM

## 2020-12-15 DIAGNOSIS — R2681 Unsteadiness on feet: Secondary | ICD-10-CM

## 2020-12-15 DIAGNOSIS — R2689 Other abnormalities of gait and mobility: Secondary | ICD-10-CM

## 2020-12-15 DIAGNOSIS — I69151 Hemiplegia and hemiparesis following nontraumatic intracerebral hemorrhage affecting right dominant side: Secondary | ICD-10-CM

## 2020-12-15 DIAGNOSIS — R278 Other lack of coordination: Secondary | ICD-10-CM

## 2020-12-15 DIAGNOSIS — R41840 Attention and concentration deficit: Secondary | ICD-10-CM

## 2020-12-15 NOTE — Therapy (Signed)
[facility] [address] [place], [place], [address] Phone: [phone]   Fax:  [phone]  Speech Language Pathology Treatment  Patient Details  Name: Jane Martin MRN: [id number] Date of Birth: Mar 06, 1996 Referring Provider (SLP): Dr. Sula Soda   Encounter Date: 12/15/2020   End of Session - 12/15/20 1232     Visit Number 5    Number of Visits 25    Date for SLP Re-Evaluation 02/16/21    Authorization Type no auth; 130 VL PT/OT/ST combined    SLP Start Time 1232    SLP Stop Time  1315    SLP Time Calculation (min) 43 min    Activity Tolerance Patient tolerated treatment well             No past medical history on file.  Past Surgical History:  Procedure Laterality Date   CRANIOTOMY Left 10/17/2020   Procedure: CRANIOTOMY HEMATOMA Evacuation;  Surgeon: Barnett Abu, MD;  [facility] OR;  Service: Neurosurgery;  Laterality: Left;   IR ANGIO INTRA EXTRACRAN SEL COM CAROTID INNOMINATE BILAT MOD SED  10/29/2020   IR ANGIO VERTEBRAL SEL VERTEBRAL BILAT MOD SED  10/29/2020   IR US GUIDE VASC ACCESS RIGHT  10/29/2020    There were no vitals filed for this visit.          ADULT SLP TREATMENT - 12/15/20 1229       General Information   Behavior/Cognition Alert;Cooperative;Pleasant mood      Treatment Provided   Treatment provided Cognitive-Linquistic      Cognitive-Linquistic Treatment   Treatment focused on Aphasia;Patient/family/caregiver education    Skilled Treatment Pt perseverated on word "speech" in description of HWK. Pt stated "when I get excited or happy, I have trouble (getting it out)." With additional processing, pt stated "sentence" in description of VNeST. SLP introduced SFA with occasional min to mod questioning cues and prompting to verbalize specific features. SLP prompted pt to write sentence with use of targeted word and ability to use descriptors. Pt benefited from  verbalizing word or spelling prior to writing. Spellings errors x2 noted this session. Pt reports double check texting with pt reportedly able to ID and correct errors.      Assessment / Recommendations / Plan   Plan Continue with current plan of care      Progression Toward Goals   Progression toward goals Progressing toward goals              SLP Education - 12/15/20 1312     Education Details SFA, compensations for aphasia    Person(s) Educated Patient    Methods Explanation;Demonstration;Verbal cues    Comprehension Verbalized understanding;Returned demonstration;Need further instruction              SLP Short Term Goals - 12/15/20 1229       SLP SHORT TERM GOAL #1   Title Pt will name 10 items in persoanlly relevant category with rare min A over 2 sessions    Time 3    Period Weeks    Status On-going      SLP SHORT TERM GOAL #2   Title Pt will utilize 2 compensatory strategies for aphasia in structured language tasks 8/10 opportunities with occasional min A over 2 sessions    Time 3    Period Weeks    Status On-going      SLP SHORT TERM GOAL #3   Title Pt will comprehend 3 sentence paragraph with rare min  A over 2 sessions    Time 3    Period Weeks    Status On-going      SLP SHORT TERM GOAL #4   Title Pt will write at sentence level, IDing errors with occasional min 8/10 errors    Baseline 12-15-20    Time 3    Period Weeks    Status On-going      SLP SHORT TERM GOAL #5   Title Pt will carryover compensations for aphasia during 12 minute simple conversation as needed with occasional min A    Time 3    Period Weeks    Status On-going              SLP Long Term Goals - 12/15/20 1230       SLP LONG TERM GOAL #1   Title Pt will complete complex naming tasks with 90% accuracy and occasional min A over 2 sessions    Time 9    Period Weeks    Status On-going      SLP LONG TERM GOAL #2   Title Pt will comprehend 3 paragraph passages of  interest to her, restating 2 salient details from each paragraph with occasional min A    Time 9    Period Weeks    Status On-going      SLP LONG TERM GOAL #3   Title Pt will ID and correct errors on texts, e mails and other 4 sentence written communications 9/10 with occasional min A    Time 9    Period Weeks    Status On-going      SLP LONG TERM GOAL #4   Title Pt will carryover compensations for aphasia as needed over 20 minute moderately complex conversation with occasional min A over 2 sessions    Time 9    Period Weeks    Status On-going      SLP LONG TERM GOAL #5   Title Pt will improve score on The Communicative Participation Item Bank by 4 points    Time 9    Period Weeks    Status On-going              Plan - 12/15/20 1311     Clinical Impression Statement Jane Martin is referred to outpt ST s/p Left ICH due to aphasia. SLP introduced SFA this session to target use of description strategy for anomia. Occasional semantic and phonemic paraphasias and anomia exhibited in conversation this session. Pt intermittently benefits from additional processing time to correct errors. At this time, Jane Martin return to her previous job due to aphasia. I recommend skilled ST to maximize communication for safety, effective and efficient participation in conversations and for possible return to the workforce.    Speech Therapy Frequency 2x / week    Duration 12 weeks   25 visits   Treatment/Interventions SLP instruction and feedback;Environmental controls;Language facilitation;Cueing hierarchy;Compensatory strategies;Functional tasks;Cognitive reorganization;Compensatory techniques;Patient/family education;Multimodal communcation approach;Internal/external aids    Potential to Achieve Goals Good    SLP Home Exercise Plan provided    Consulted and Agree with Plan of Care Patient             Patient will benefit from skilled therapeutic intervention in order to improve the  following deficits and impairments:   Aphasia    Problem List Patient Active Problem List   Diagnosis Date Noted   Acute blood loss anemia 11/10/2020   Abnormal LFTs 11/10/2020   Aphasia 10/30/2020  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 [place], [place], [address] Phone: [phone]   Fax:  [phone]   Name: Jane Martin MRN: [id number] Date of Birth: 1996-03-25

## 2020-12-15 NOTE — Therapy (Signed)
[facility] [address] [place], [place], [address] Phone: [phone]   Fax:  [phone]  Speech Language Pathology Treatment  Patient Details  Name: Jane Martin MRN: [id number] Date of Birth: 1996-04-21 Referring Provider (SLP): Dr. Sula Soda   Encounter Date: 12/15/2020   End of Session - 12/15/20 1232     Visit Number 5    Number of Visits 25    Date for SLP Re-Evaluation 02/16/21    Authorization Type no auth; 130 VL PT/OT/ST combined    SLP Start Time 1232    SLP Stop Time  1315    SLP Time Calculation (min) 43 min    Activity Tolerance Patient tolerated treatment well             No past medical history on file.  Past Surgical History:  Procedure Laterality Date   CRANIOTOMY Left 10/17/2020   Procedure: CRANIOTOMY HEMATOMA Evacuation;  Surgeon: Barnett Abu, MD;  Location: [facility] OR;  Service: Neurosurgery;  Laterality: Left;   IR ANGIO INTRA EXTRACRAN SEL COM CAROTID INNOMINATE BILAT MOD SED  10/29/2020   IR ANGIO VERTEBRAL SEL VERTEBRAL BILAT MOD SED  10/29/2020   IR US GUIDE VASC ACCESS RIGHT  10/29/2020    There were no vitals filed for this visit.          ADULT SLP TREATMENT - 12/15/20 1229       General Information   Behavior/Cognition Alert;Cooperative;Pleasant mood      Treatment Provided   Treatment provided Cognitive-Linquistic      Cognitive-Linquistic Treatment   Treatment focused on Aphasia;Patient/family/caregiver education    Skilled Treatment Pt perseverated on word "speech" in description of HWK. Pt stated "when I get excited or happy, I have trouble (getting it out)." With additional processing, pt stated "sentence" in description of VNeST. SLP introduced SFA with occasional min to mod questioning cues and prompting to verbalize specific features. SLP targeting writing sentences with targeted words and visual cue of descriptors. Pt benefited from verbalizing  sentence and spelling prior to writing. Spellings errors x2 noted this session. Pt reports double check texting with pt reportedly able to ID and correct errors.      Assessment / Recommendations / Plan   Plan Continue with current plan of care      Progression Toward Goals   Progression toward goals Progressing toward goals              SLP Education - 12/15/20 1312     Education Details SFA, compensations for aphasia    Person(s) Educated Patient    Methods Explanation;Demonstration;Verbal cues    Comprehension Verbalized understanding;Returned demonstration;Need further instruction              SLP Short Term Goals - 12/15/20 1229       SLP SHORT TERM GOAL #1   Title Pt will name 10 items in persoanlly relevant category with rare min A over 2 sessions    Time 3    Period Weeks    Status On-going      SLP SHORT TERM GOAL #2   Title Pt will utilize 2 compensatory strategies for aphasia in structured language tasks 8/10 opportunities with occasional min A over 2 sessions    Time 3    Period Weeks    Status On-going      SLP SHORT TERM GOAL #3   Title Pt will comprehend 3 sentence paragraph with rare min A over 2 sessions  Time 3    Period Weeks    Status On-going      SLP SHORT TERM GOAL #4   Title Pt will write at sentence level, IDing errors with occasional min 8/10 errors    Baseline 12-15-20    Time 3    Period Weeks    Status On-going      SLP SHORT TERM GOAL #5   Title Pt will carryover compensations for aphasia during 12 minute simple conversation as needed with occasional min A    Time 3    Period Weeks    Status On-going              SLP Long Term Goals - 12/15/20 1230       SLP LONG TERM GOAL #1   Title Pt will complete complex naming tasks with 90% accuracy and occasional min A over 2 sessions    Time 9    Period Weeks    Status On-going      SLP LONG TERM GOAL #2   Title Pt will comprehend 3 paragraph passages of interest to  her, restating 2 salient details from each paragraph with occasional min A    Time 9    Period Weeks    Status On-going      SLP LONG TERM GOAL #3   Title Pt will ID and correct errors on texts, e mails and other 4 sentence written communications 9/10 with occasional min A    Time 9    Period Weeks    Status On-going      SLP LONG TERM GOAL #4   Title Pt will carryover compensations for aphasia as needed over 20 minute moderately complex conversation with occasional min A over 2 sessions    Time 9    Period Weeks    Status On-going      SLP LONG TERM GOAL #5   Title Pt will improve score on The Communicative Participation Item Bank by 4 points    Time 9    Period Weeks    Status On-going              Plan - 12/15/20 1311     Clinical Impression Statement Crystalyn Delia is referred to outpt ST s/p Left ICH due to aphasia. SLP introduced SFA this session to target use of description strategy for anomia. Occasional semantic and phonemic paraphasias and anomia exhibited in conversation this session. Pt intermittently benefits from additional processing time to correct errors. At this time, Tyliyah can not return to her previous job due to aphasia. I recommend skilled ST to maximize communication for safety, effective and efficient participation in conversations and for possible return to the workforce.    Speech Therapy Frequency 2x / week    Duration 12 weeks   25 visits   Treatment/Interventions SLP instruction and feedback;Environmental controls;Language facilitation;Cueing hierarchy;Compensatory strategies;Functional tasks;Cognitive reorganization;Compensatory techniques;Patient/family education;Multimodal communcation approach;Internal/external aids    Potential to Achieve Goals Good    SLP Home Exercise Plan provided    Consulted and Agree with Plan of Care Patient             Patient will benefit from skilled therapeutic intervention in order to improve the following  deficits and impairments:   Aphasia    Problem List Patient Active Problem List   Diagnosis Date Noted   Acute blood loss anemia 11/10/2020   Abnormal LFTs 11/10/2020   Aphasia 10/30/2020   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 [place], [place], [address] Phone: [phone]   Fax:  [phone]   Name: DANITA PROUD MRN: [id number] Date of Birth: Feb 07, 1996

## 2020-12-15 NOTE — Therapy (Signed)
[facility] [address] [place], [place], [address] Phone: [phone]   Fax:  [phone]  Occupational Therapy Treatment  Patient Details  Name: Jane Martin MRN: [id number] Date of Birth: Jun 28, 1996 Referring Provider (OT): f/u w Ihor Austin, NP   Encounter Date: 12/15/2020   OT End of Session - 12/15/20 1149     Visit Number 4    Number of Visits 25    Date for OT Re-Evaluation 02/16/21    Authorization Type [facility]    Authorization Time Period VL:MN    OT Start Time 1148    OT Stop Time 1230    OT Time Calculation (min) 42 min    Activity Tolerance Patient tolerated treatment well    Behavior During Therapy [facility] for tasks assessed/performed             History reviewed. No pertinent past medical history.  Past Surgical History:  Procedure Laterality Date   CRANIOTOMY Left 10/17/2020   Procedure: CRANIOTOMY HEMATOMA Evacuation;  Surgeon: Barnett Abu, MD;  Location: [facility] OR;  Service: Neurosurgery;  Laterality: Left;   IR ANGIO INTRA EXTRACRAN SEL COM CAROTID INNOMINATE BILAT MOD SED  10/29/2020   IR ANGIO VERTEBRAL SEL VERTEBRAL BILAT MOD SED  10/29/2020   IR US GUIDE VASC ACCESS RIGHT  10/29/2020    There were no vitals filed for this visit.   Subjective Assessment - 12/15/20 1150     Subjective  For some reason by head, oops I mean feet, feel heavy.    Pertinent History None    Limitations Aphasia (expressive and receptive), Absent sensation on R hemi    Patient Stated Goals "to be able to feel myself, do for myself, drive"    Currently in Pain? No/denies               Small Pegs with RUE for increase in coordination. Pt did well with min difficulty and min drops.   Environmental Scanning with 12/16 accuracy = 75% on first pass. Pt required mod cues for locating remaining items on second pass.  Pennies picking up one at a time and placing in bank with min drops, stacking in stacks  of 5 and placing in bank with in hand manipulation. Max difficulty and drops with in hand manipulation.                  OT Short Term Goals - 12/08/20 1452       OT SHORT TERM GOAL #1   Title Pt will be independent with HEP for RUE coordination and grip strength    Time 4    Period Weeks    Status On-going    Target Date 12/22/20      OT SHORT TERM GOAL #2   Title Pt will verbalize understanding of sensory strategies for increased safety awareness    Time 4    Period Weeks    Status On-going   reviewed sensory strategies for safety     OT SHORT TERM GOAL #3   Title Pt will perform environmental scanning in moderately distracting environment with 90% accuracy or greater.    Time 4    Period Weeks    Status New      OT SHORT TERM GOAL #4   Title Pt will improve box and blocks score by 4 blocks or more with RUE for increase in overall functional use of RUE.    Baseline R 21 L 51  Time 4    Period Weeks    Status New      OT SHORT TERM GOAL #5   Title Pt will perform simple warm meal prep with supervision and good safety awareness    Time 4    Period Weeks    Status New               OT Long Term Goals - 11/24/20 1710       OT LONG TERM GOAL #1   Title Pt will be independent with updated HEPs    Time 12    Period Weeks    Status New    Target Date 02/16/21      OT LONG TERM GOAL #2   Title Pt will increase grip strength in RUE by 8 lbs or greater for increasein functional use of RUE, dominant, hand.    Baseline R 24.6, L 42.1    Time 12    Period Weeks    Status New      OT LONG TERM GOAL #3   Title Pt will complete 9 hole peg test in 2 minutes or less with RUE for increase in fine motor coordination    Baseline R 8 pegs in 2 min, 30 seconds L 24.63s    Time 12    Period Weeks    Status New      OT LONG TERM GOAL #4   Title Pt will complete physical and cognitive task simultaneously with 90% accuracy or greater    Time 12    Period  Weeks    Status New      OT LONG TERM GOAL #5   Title Pt will demonstrate or report increased ease with styling her own hair with bilateral UE.    Time 12    Period Weeks    Status New      Long Term Additional Goals   Additional Long Term Goals Yes      OT LONG TERM GOAL #6   Title Pt and caregiver will verbalize undersatnding for return to driving and return to work recommendations.    Time 12    Period Weeks    Status New      OT LONG TERM GOAL #7   Title Pt will score a 57 or greater on FOTO at discharge.    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 [place], [place], [address] Phone: [phone]   Fax:  [phone]  Name: Jane Martin MRN: [id number] Date of Birth: 1996/03/14

## 2020-12-15 NOTE — Progress Notes (Signed)
Neuropsychological Consultation   Patient:   Jane Martin   DOB:   07-12-1995  MR Number:  [id number]  Location:  [facility] [address] [place], [address] [id number] [facility] [place] [address] Dept: [phone]           Date of Service:   01/14/2021  Start Time:   9 AM End Time:   11 AM  Today's visit was an in person visit that was conducted in my outpatient clinic office with the patient and her mother present.  1 hour and 15 minutes were spent in clinical interview and the other 45 minutes were spent with record review and report writing.  Provider/Observer:  Arley Phenix, Psy.D.       Clinical Neuropsychologist       Billing Code/Service: [phone]  Chief Complaint:    Jane Martin is a 25 year old female who was admitted to the hospital on 10/17/2020 after being observed collapsing at a gas station and was unresponsive with fixed gaze, right hemiplegia and agonal breathing.  The patient had neurosurgical interventions by Dr. Danielle Dess and upon recovering and stabilization medically was seen on the comprehensive rehabilitation unit.  I saw the patient while she was on the inpatient unit as well.  The patient has recovered significantly but continues to have changes in somatosensory functioning on the right side of her body and some continued issues with reading and reading comprehension and some expressive language changes/aphasia as well as attention issues.  This is an outpatient follow-up for the patient's hospitalization and cerebrovascular accident.  Reason for Service:  Jane Martin is a 25 year old female who was admitted to the hospital on 10/17/2020 after being observed collapsing at a gas station and was unresponsive with fixed gaze, right hemiplegia and agonal breathing.  The patient had neurosurgical interventions by Dr. Danielle Dess and upon recovering and stabilization medically  was seen on the comprehensive rehabilitation unit.  I saw the patient while she was on the inpatient unit as well.  The patient has recovered significantly but continues to have changes in somatosensory functioning on the right side of her body and some continued issues with reading and reading comprehension and some expressive language changes/aphasia as well as attention issues.  This is an outpatient follow-up for the patient's hospitalization and cerebrovascular accident.  Jane Martin is a 25 year old female who was admitted on 10/17/2020 after observed collapsing at a gas station and was unresponsive with fixed gaze, right hemiplegia and agonal breathing.  The patient's mother reports that the patient has been feeling very weird while driving down the road and pulled off at a gas station.  She alerted another driver that she was not feeling well and to call 911.  When the driver called and turned back to look she was collapsed on the ground.  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  CTA was negative for AVM, aneurysm or high-grade stenosis.  Patient was taken emergently to the OR for left parietal [place] for evacuation of hemorrhage and placement of IVC by Dr. Danielle Dess.  Patient was found to be COVID-positive and placed in isolation.  Patient was found to be negative for mass or vascular lesion or venous thrombosis.  Patient had  left parietal hematoma with resolution of midline shift after intervention.  Patient was extubated without difficulty on 4/18 and IVC removed on 4/19.  Patient had recurrent bouts of lethargy, expressive aphasia and right upper extremity greater than right lower extremity weakness and right facial droop.   Dr. Danielle Dess did provide a note that included quotes a small  cluster tissue was found that was cauterized and removed in a singular piece.  After this it seemed that the hemorrhage subsided considerably.  Several biopsies of the surrounding brain area were obtained."  Pathology identified benign brain parenchyma and no specific causative factors were identified or opined regarding the development of these abnormalities.  The patient displayed some significant improvements during her hospitalization and CIR care but continued to show some limited awareness of the degree of both cognitive changes and indications of inattention and neglect type symptoms during her hospitalization.  The patient primarily focused on the numbness on the right side of her body and significant improvements were obtained in her expressive language functioning.  The symptoms were consistent with her ICH and brain injury consistent with left frontoparietal brain region involvement.  The patient describes what was going on prior to her CVA.  She reports that she was having her hair done and began feeling rather hot.  She was driving away from this hair appointment and she pulled over to get out of the car and "fell out."  Another individual solve is happening called 911.  The patient was hospitalized for 2 or 3 weeks and does not have a lot of memory for large sections or timeframe during this hospitalization.  The patient was found to be COVID-positive and is unsure as to whether or not COVID played a role in her CVA.  During the clinical interview today the patient reports that she still has significant somatosensory disturbance on the right side of her body and describes it is having no tactile sensation on the right side.  The patient denies any significant memory changes other than difficulty reading and comprehending and remembering what she is reading.  The patient reports that she is remembering episodic information.  The patient's mother reports that the patient continues to have  difficulty in conversation and getting the words right that she wants to say but the mother reports that she feels like the patient knows what she wants to say just has trouble with word recall.  There have been disturbance with walking that are associated with these sensorimotor changes and some balance but all of these are improving.  Patient's mother reports that the patient's reasoning and problem-solving appear to be okay and that overall memory is generally good with exception of language-based skills.  The patient is able to stay on task with adequate attention and concentration.  The patient and her mother both report that she is having to use her left hand for most activities that she cannot feel on the right side of her body.  The patient reports that her sleep has been disturbed and that she tends to sleep during the day and has trouble going to sleep at night.  She reports that she will not be able to go to sleep until sometimes after 3 AM and sleeps during the daytime.  She reports that her appetite is good.  There are some issues with certain types of memory functions.  Behavioral Observation: Jane Martin  presents as a 25 y.o.-year-old Right African American Female who appeared her stated age. her dress was  Appropriate and she was Well Groomed and her manners were Appropriate to the situation.  her participation was indicative of Appropriate and Redirectable behaviors.  There were physical disabilities noted.  she displayed an appropriate level of cooperation and motivation.     Interactions:    Active Appropriate and Redirectable  Attention:   abnormal and attention span appeared shorter than expected for age  Memory:   within normal limits; recent and remote memory intact  Visuo-spatial:  not examined  Speech (Volume):  low  Speech:   non-fluent aphasia; while there have been significant improvements in her expressive language abilities the patient continues to have word  finding issues.  Thought Process:  Coherent and Relevant  Though Content:  WNL; not suicidal and not homicidal  Orientation:   person, place, time/date, and situation  Judgment:   Fair  Planning:   Fair  Affect:    Appropriate  Mood:    Dysphoric  Insight:   Good  Intelligence:   normal  Marital Status/Living: [place] with 1 sibling.  She is single and has no children.  She currently lives with her mother and has been living with her mother since birth.  Current Employment: The patient continues to work at [employer] although she has not returned to active work.  Substance Use:  No concerns of substance abuse are reported.    Education:   HS Graduate  Medical History:  History reviewed. No pertinent past medical history.       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  The patient had neurosurgical interventions by Dr. Danielle DessElsner and upon recovering and stabilization medically was seen on the comprehensive rehabilitation unit.  I saw the patient while she was on the inpatient unit as well.  The patient has recovered significantly but continues to have changes in somatosensory functioning on the right side of her body and  some continued issues with reading and reading comprehension and some expressive language changes/aphasia as well as attention issues.  This is an outpatient follow-up for the patient's hospitalization and cerebrovascular accident.  Disposition/Plan:  We have set the patient up for formal neuropsychological assessment.  She will complete a foundational battery including the Wechsler Adult Intelligence Scale and the Wechsler Memory Scale's.  We will also include measures of motor functioning and sensation including the grooved pegboard test, the hand dynamometer test and the finger tapping test as well as measures of verbal fluency including animal naming and other verbal fluency measures.  Once this is completed I will sit down with the patient and her family and go over the results and work on specific recommendations both for the patient and family  as well as for ongoing rehabilitative efforts with speech, OT and PT that are currently being conducted.  The patient is also being followed by Dr. Carlis Abbott through our outpatient office for physiatry and I will coordinate care with her physician as well.  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 Clinical Neuropsychologist

## 2020-12-16 ENCOUNTER — Other Ambulatory Visit: Payer: Self-pay

## 2020-12-16 ENCOUNTER — Ambulatory Visit
Admission: EM | Admit: 2020-12-16 | Discharge: 2020-12-16 | Disposition: A | Discharge status: Home / Self Care | Payer: 59 | Attending: Emergency Medicine | Admitting: Emergency Medicine

## 2020-12-16 ENCOUNTER — Encounter: Payer: Self-pay | Admitting: Emergency Medicine

## 2020-12-16 DIAGNOSIS — N898 Other specified noninflammatory disorders of vagina: Secondary | ICD-10-CM | POA: Diagnosis not present

## 2020-12-16 LAB — POCT URINALYSIS DIP (MANUAL ENTRY)
Bilirubin, UA: NEGATIVE
Glucose, UA: NEGATIVE mg/dL
Ketones, POC UA: NEGATIVE mg/dL
Nitrite, UA: NEGATIVE
Protein Ur, POC: NEGATIVE mg/dL
Spec Grav, UA: >=1.03 — AB (ref 1.010–1.025)
Urobilinogen, UA: 0.2 E.U./dL
pH, UA: 5.5 (ref 5.0–8.0)

## 2020-12-16 LAB — POCT URINE PREGNANCY: Preg Test, Ur: NEGATIVE

## 2020-12-16 MED ORDER — MONISTAT 1 COMBO PACK 1200 & 2 MG & % VA KIT: 1.0000 | PACK | Freq: Once | VAGINAL | 0 refills | Status: AC

## 2020-12-16 NOTE — ED Triage Notes (Signed)
Pt presents today with c/o of vaginal burning and itching x 5 days. Denies dysuria and/or vaginal discharge.

## 2020-12-16 NOTE — Discharge Instructions (Addendum)
Use the Monistat combo pack as directed.    Your vaginal tests are pending.  If your test results are positive, we will call you.  You and your sexual partner(s) may require treatment at that time.  Do not have sexual activity for at least 7 days.    Follow up with your primary care provider if your symptoms are not improving.

## 2020-12-16 NOTE — ED Provider Notes (Signed)
[name]    CSN: [id number] Arrival date & time: 12/16/20  5681      History   Chief Complaint Chief Complaint  Patient presents with   Vaginal Itching   vaginal burning     HPI Jane Martin is a 25 y.o. female.  Accompanied by her mother, patient presents with 5-day history of vaginal itching and irritation.  She denies vaginal discharge, pelvic pain, dysuria, hematuria, abdominal pain, back pain, rash, lesions, or other symptoms.  No treatments attempted at home.  Her medical history includes candidal vaginitis, bacterial vaginitis, chlamydia, gonorrhea, intracranial hemorrhage in April 2022, CVA, hemiplegia and hemiparesis following nontraumatic intracerebral hemorrhage affecting right dominant side, attention and concentration deficit, frontal lobe and executive function deficit, muscle weakness, abnormal LFTs.  The history is provided by the patient and a parent.   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;  Service: Neurosurgery;  Laterality: Left;   IR ANGIO INTRA EXTRACRAN SEL COM CAROTID INNOMINATE BILAT MOD SED  10/29/2020   IR ANGIO VERTEBRAL SEL VERTEBRAL BILAT MOD SED  10/29/2020   IR US GUIDE VASC ACCESS RIGHT  10/29/2020    OB History   No obstetric history on file.      Home Medications    Prior to Admission medications   Medication Sig Start Date End Date Taking? Authorizing Provider  miconazole (MONISTAT 1 COMBO PACK) kit Place 1 each vaginally once for 1 dose.  12/16/20 12/16/20 Yes Sharion Balloon, NP  acetaminophen (TYLENOL) 325 MG tablet Take 2 tablets (650 mg total) by mouth every 6 (six) hours as needed for mild pain (or temp > 37.5 C (99.5 F)). 10/30/20   Thurnell Lose, MD  artificial tears (LACRILUBE) OINT ophthalmic ointment Place 1 application into both eyes as needed (apply to protect eyes from lash extensions). 10/30/20   Thurnell Lose, MD    Family History Family History  Problem Relation Age of Onset   Hypertension Mother    Intracerebral hemorrhage Mother        due to HTN   Healthy Father     Social History Social History   Tobacco Use   Smoking status: Never   Smokeless tobacco: Never  Vaping Use   Vaping Use: Never used  Substance Use Topics   Alcohol use: Yes   Drug use: Yes    Types: Marijuana     Allergies   Flagyl [metronidazole]   Review of Systems Review of Systems  Constitutional:  Negative for chills and fever.  Respiratory:  Negative for cough and shortness of breath.   Cardiovascular:  Negative for chest pain and palpitations.  Gastrointestinal:  Negative for abdominal pain and vomiting.  Genitourinary:  Negative for dysuria, flank pain, hematuria, pelvic pain and vaginal discharge.  Skin:  Negative for color change and rash.  All other systems reviewed and are negative.   Physical Exam Triage Vital Signs ED Triage Vitals  Enc Vitals Group     BP  Pulse      Resp      Temp      Temp src      SpO2      Weight      Height      Head Circumference      Peak Flow      Pain Score      Pain Loc      Pain Edu?      Excl. in [place]?    No data found.  Updated Vital Signs BP 110/76 (BP Location: Right Arm)   Pulse 83   Temp 98.3 F (36.8 C) (Oral)   Resp 18   LMP 11/21/2020 (Exact Date)   SpO2 96%   Visual Acuity Right Eye Distance:   Left Eye Distance:   Bilateral Distance:    Right Eye Near:   Left Eye Near:    Bilateral Near:     Physical Exam Vitals and nursing note  reviewed.  Constitutional:      General: She is not in acute distress.    Appearance: She is well-developed. She is not ill-appearing.  HENT:     Head: Normocephalic and atraumatic.     Mouth/Throat:     Mouth: Mucous membranes are moist.  Eyes:     Conjunctiva/sclera: Conjunctivae normal.  Cardiovascular:     Rate and Rhythm: Normal rate and regular rhythm.     Heart sounds: Normal heart sounds.  Pulmonary:     Effort: Pulmonary effort is normal. No respiratory distress.     Breath sounds: Normal breath sounds.  Abdominal:     Palpations: Abdomen is soft.     Tenderness: There is no abdominal tenderness. There is no right CVA tenderness, left CVA tenderness, guarding or rebound.  Musculoskeletal:     Cervical back: Neck supple.  Skin:    General: Skin is warm and dry.  Neurological:     Mental Status: She is alert. Mental status is at baseline.  Psychiatric:        Mood and Affect: Mood normal.        Behavior: Behavior normal.     UC Treatments / Results  Labs (all labs ordered are listed, but only abnormal results are displayed) Labs Reviewed  POCT URINALYSIS DIP (MANUAL ENTRY) - Abnormal; Notable for the following components:      Result Value   Clarity, UA cloudy (*)    Spec Grav, UA >=1.030 (*)    Blood, UA trace-intact (*)    Leukocytes, UA Small (1+) (*)    All other components within normal limits  URINE CULTURE  POCT URINE PREGNANCY  CERVICOVAGINAL ANCILLARY ONLY    EKG   Radiology No results found.  Procedures Procedures (including critical care time)  Medications Ordered in UC Medications - No data to display  Initial Impression / Assessment and Plan / UC Course  I have reviewed the triage vital signs and the nursing notes.  Pertinent labs & imaging results that were available during my care of the patient were reviewed by me and considered in my medical decision making (see chart for details).  Vaginal itching, vaginal irritation.  Urine  culture pending.  Patient obtained vaginal self swab for testing.  Due to her recent history of abnormal LFTs, will not treat with Diflucan today; treating with Monistat combo pack instead.  Instructed patient to abstain from sexual activity for at least the next 7 days.  Discussed that we will call her if her test  shows the need for additional treatment.  Discussed that she should follow-up with her PCP or OB/GYN if her symptoms are not improving.  She agrees to plan of care.   Final Clinical Impressions(s) / UC Diagnoses   Final diagnoses:  Vaginal itching  Vaginal irritation     Discharge Instructions      Use the Monistat combo pack as directed.    Your vaginal tests are pending.  If your test results are positive, we will call you.  You and your sexual partner(s) may require treatment at that time.  Do not have sexual activity for at least 7 days.    Follow up with your primary care provider if your symptoms are not improving.         ED Prescriptions     Medication Sig Dispense Auth. Provider   miconazole (MONISTAT 1 COMBO PACK) kit Place 1 each vaginally once for 1 dose. 1 each Sharion Balloon, NP      PDMP not reviewed this encounter.   Sharion Balloon, NP 12/16/20 [phone]

## 2020-12-17 LAB — CERVICOVAGINAL ANCILLARY ONLY
Bacterial Vaginitis (gardnerella): POSITIVE — AB
Candida Glabrata: NEGATIVE
Candida Vaginitis: POSITIVE — AB
Chlamydia: NEGATIVE
Comment: NEGATIVE
Comment: NEGATIVE
Comment: NEGATIVE
Comment: NEGATIVE
Comment: NEGATIVE
Comment: NORMAL
Neisseria Gonorrhea: NEGATIVE
Trichomonas: NEGATIVE

## 2020-12-17 LAB — URINE CULTURE

## 2020-12-18 ENCOUNTER — Ambulatory Visit: Payer: PRIVATE HEALTH INSURANCE

## 2020-12-18 ENCOUNTER — Ambulatory Visit: Payer: PRIVATE HEALTH INSURANCE | Admitting: Occupational Therapy

## 2020-12-18 ENCOUNTER — Telehealth (HOSPITAL_COMMUNITY): Payer: Self-pay | Admitting: Emergency Medicine

## 2020-12-18 ENCOUNTER — Encounter: Payer: Self-pay | Admitting: Occupational Therapy

## 2020-12-18 ENCOUNTER — Other Ambulatory Visit: Payer: Self-pay

## 2020-12-18 DIAGNOSIS — R2689 Other abnormalities of gait and mobility: Secondary | ICD-10-CM

## 2020-12-18 DIAGNOSIS — R41840 Attention and concentration deficit: Secondary | ICD-10-CM

## 2020-12-18 DIAGNOSIS — R2681 Unsteadiness on feet: Secondary | ICD-10-CM

## 2020-12-18 DIAGNOSIS — M6281 Muscle weakness (generalized): Secondary | ICD-10-CM

## 2020-12-18 DIAGNOSIS — I69390 Apraxia following cerebral infarction: Secondary | ICD-10-CM | POA: Diagnosis not present

## 2020-12-18 DIAGNOSIS — R41844 Frontal lobe and executive function deficit: Secondary | ICD-10-CM

## 2020-12-18 DIAGNOSIS — R4701 Aphasia: Secondary | ICD-10-CM | POA: Diagnosis not present

## 2020-12-18 DIAGNOSIS — I69151 Hemiplegia and hemiparesis following nontraumatic intracerebral hemorrhage affecting right dominant side: Secondary | ICD-10-CM

## 2020-12-18 DIAGNOSIS — R278 Other lack of coordination: Secondary | ICD-10-CM

## 2020-12-18 DIAGNOSIS — I612 Nontraumatic intracerebral hemorrhage in hemisphere, unspecified: Secondary | ICD-10-CM | POA: Diagnosis not present

## 2020-12-18 MED ORDER — METRONIDAZOLE 0.75 % VA GEL: 1.0000 | Freq: Every day | VAGINAL | 0 refills | Status: AC

## 2020-12-18 MED ORDER — FLUCONAZOLE 150 MG PO TABS: 150.0000 mg | ORAL_TABLET | Freq: Once | ORAL | 0 refills | Status: AC

## 2020-12-18 NOTE — Therapy (Signed)
[facility] [address] [place], [place], [address] Phone: [phone]   Fax:  [phone]  Speech Language Pathology Treatment  Patient Details  Name: Jane Martin MRN: [id number] Date of Birth: 1995/07/26 Referring Provider (SLP): Dr. Sula Soda   Encounter Date: 12/18/2020   End of Session - 12/18/20 1113     Visit Number 6    Number of Visits 25    Date for SLP Re-Evaluation 02/16/21    Authorization Type no auth; 130 VL PT/OT/ST combined    SLP Start Time 1105    SLP Stop Time  1145    SLP Time Calculation (min) 40 min    Activity Tolerance Patient tolerated treatment well             Past Medical History:  Diagnosis Date   Stroke ([facility]) 10/07/2020    Past Surgical History:  Procedure Laterality Date   CRANIOTOMY Left 10/17/2020   Procedure: CRANIOTOMY HEMATOMA Evacuation;  Surgeon: Barnett Abu, MD;  Location: [facility] OR;  Service: Neurosurgery;  Laterality: Left;   IR ANGIO INTRA EXTRACRAN SEL COM CAROTID INNOMINATE BILAT MOD SED  10/29/2020   IR ANGIO VERTEBRAL SEL VERTEBRAL BILAT MOD SED  10/29/2020   IR US GUIDE VASC ACCESS RIGHT  10/29/2020    There were no vitals filed for this visit.   Subjective Assessment - 12/18/20 1107     Subjective "it's going better"    Currently in Pain? No/denies                   ADULT SLP TREATMENT - 12/18/20 1107       General Information   Behavior/Cognition Alert;Cooperative;Pleasant mood      Treatment Provided   Treatment provided Cognitive-Linquistic      Cognitive-Linquistic Treatment   Treatment focused on Aphasia;Patient/family/caregiver education    Skilled Treatment SLP trialed use of synonym strategy this session to aid word finding. Pt verbalized 1-3 synonyms with occasional additional processing time and rare min A for comprehension of word. Occasional min A for spelling, with SLP educating pronunication of syllables to sound  out. Written and verbal perseverations noted on letters. Occasional dysnomia noted in conversation, with usual self-corrections noted. Pt reports improved functioning at home versus reduced performance at therapy, with SLP providing education re: novel challenges during therapy and importance of neuroplasticity. Pt verbalized understanding.      Assessment / Recommendations / Plan   Plan Continue with current plan of care      Progression Toward Goals   Progression toward goals Progressing toward goals              SLP Education - 12/18/20 1113     Education Details synonym strategy    Person(s) Educated Patient    Methods Explanation;Demonstration;Handout    Comprehension Verbalized understanding;Returned demonstration;Need further instruction              SLP Short Term Goals - 12/18/20 1114       SLP SHORT TERM GOAL #1   Title Pt will name 10 items in persoanlly relevant category with rare min A over 2 sessions    Time 3    Period Weeks    Status On-going      SLP SHORT TERM GOAL #2   Title Pt will utilize 2 compensatory strategies for aphasia in structured language tasks 8/10 opportunities with occasional min A over 2 sessions    Time 3    Period Weeks  Status On-going      SLP SHORT TERM GOAL #3   Title Pt will comprehend 3 sentence paragraph with rare min A over 2 sessions    Time 3    Period Weeks    Status On-going      SLP SHORT TERM GOAL #4   Title Pt will write at sentence level, IDing errors with occasional min 8/10 errors    Baseline 12-15-20    Time 3    Period Weeks    Status On-going      SLP SHORT TERM GOAL #5   Title Pt will carryover compensations for aphasia during 12 minute simple conversation as needed with occasional min A    Time 3    Period Weeks    Status On-going              SLP Long Term Goals - 12/18/20 1114       SLP LONG TERM GOAL #1   Title Pt will complete complex naming tasks with 90% accuracy and occasional  min A over 2 sessions    Time 9    Period Weeks    Status On-going      SLP LONG TERM GOAL #2   Title Pt will comprehend 3 paragraph passages of interest to her, restating 2 salient details from each paragraph with occasional min A    Time 9    Period Weeks    Status On-going      SLP LONG TERM GOAL #3   Title Pt will ID and correct errors on texts, e mails and other 4 sentence written communications 9/10 with occasional min A    Time 9    Period Weeks    Status On-going      SLP LONG TERM GOAL #4   Title Pt will carryover compensations for aphasia as needed over 20 minute moderately complex conversation with occasional min A over 2 sessions    Time 9    Period Weeks    Status On-going      SLP LONG TERM GOAL #5   Title Pt will improve score on The Communicative Participation Item Bank by 4 points    Time 9    Period Weeks    Status On-going              Plan - 12/18/20 1415     Clinical Impression Statement Jane Martin is referred to outpt ST s/p Left ICH due to aphasia. SLP targeted use of synonym strategey for anomia, with occasional additional processing time and min to mod cues required to verbalize 1-3 synonyms. Education provided re: various therapeutic tasks targeting word finding. At this time, Jane Martin can not return to her previous job due to aphasia. I recommend skilled ST to maximize communication for safety, effective and efficient participation in conversations and for possible return to the workforce.    Speech Therapy Frequency 2x / week    Duration 12 weeks   25 visits   Treatment/Interventions SLP instruction and feedback;Environmental controls;Language facilitation;Cueing hierarchy;Compensatory strategies;Functional tasks;Cognitive reorganization;Compensatory techniques;Patient/family education;Multimodal communcation approach;Internal/external aids    Potential to Achieve Goals Good    SLP Home Exercise Plan provided    Consulted and Agree with Plan  of Care Patient             Patient will benefit from skilled therapeutic intervention in order to improve the following deficits and impairments:   Aphasia    Problem List Patient Active Problem List  Diagnosis Date Noted   Acute blood loss anemia 11/10/2020   Abnormal LFTs 11/10/2020   Aphasia 10/30/2020   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 [place], [place], [address] Phone: [phone]   Fax:  [phone]   Name: Jane Martin MRN: [id number] Date of Birth: [birth year]

## 2020-12-18 NOTE — Therapy (Signed)
[facility] [address] [place], [place], [address] Phone: [phone]   Fax:  [phone]  Occupational Therapy Treatment  Patient Details  Name: Jane Martin MRN: [id number] Date of Birth: 1995-11-11 Referring Provider (OT): f/u w Ihor Austin, NP   Encounter Date: 12/18/2020   OT End of Session - 12/18/20 1238     Visit Number 5    Number of Visits 25    Date for OT Re-Evaluation 02/16/21    Authorization Type [facility]    Authorization Time Period VL:MN    OT Start Time 1237    OT Stop Time 1315    OT Time Calculation (min) 38 min    Activity Tolerance Patient tolerated treatment well    Behavior During Therapy [facility] for tasks assessed/performed             Past Medical History:  Diagnosis Date   Stroke ([facility]) 10/07/2020    Past Surgical History:  Procedure Laterality Date   CRANIOTOMY Left 10/17/2020   Procedure: CRANIOTOMY HEMATOMA Evacuation;  Surgeon: Barnett Abu, MD;  Location: [facility] OR;  Service: Neurosurgery;  Laterality: Left;   IR ANGIO INTRA EXTRACRAN SEL COM CAROTID INNOMINATE BILAT MOD SED  10/29/2020   IR ANGIO VERTEBRAL SEL VERTEBRAL BILAT MOD SED  10/29/2020   IR US GUIDE VASC ACCESS RIGHT  10/29/2020    There were no vitals filed for this visit.   Subjective Assessment - 12/18/20 1239     Subjective  PT reports sometimes she feels like it's coming back when she wakes up and then it's back to the way it was.    Pertinent History None    Limitations Aphasia (expressive and receptive), Absent sensation on R hemi    Patient Stated Goals "to be able to feel myself, do for myself, drive"    Currently in Pain? No/denies               Tracing shapes and letters with wide barrel highligher and rubber band for added grip/traction. Pt completed with RUE with moderate- max deviation from lines. Pt managed coordinating holding/grip of highlighter with rubberband with no drops. Pt wrote  first and last name with tool with 80% legibility.  Golf [place] with max difficulty. Pt unable to get full rotation with 1 ball and with 2 balls d/t lack of sensation.   Constant Therapy Alternating Symbols level 5 with 91% accuracy and 60.76s response time. Read a Map level 1 with 100% accuracy and 55.31s response time. Pt required mod assistance for reading and understanding questions d/t aphasia.                      OT Short Term Goals - 12/08/20 1452       OT SHORT TERM GOAL #1   Title Pt will be independent with HEP for RUE coordination and grip strength    Time 4    Period Weeks    Status On-going    Target Date 12/22/20      OT SHORT TERM GOAL #2   Title Pt will verbalize understanding of sensory strategies for increased safety awareness    Time 4    Period Weeks    Status On-going   reviewed sensory strategies for safety     OT SHORT TERM GOAL #3   Title Pt will perform environmental scanning in moderately distracting environment with 90% accuracy or greater.    Time 4  Period Weeks    Status New      OT SHORT TERM GOAL #4   Title Pt will improve box and blocks score by 4 blocks or more with RUE for increase in overall functional use of RUE.    Baseline R 21 L 51    Time 4    Period Weeks    Status New      OT SHORT TERM GOAL #5   Title Pt will perform simple warm meal prep with supervision and good safety awareness    Time 4    Period Weeks    Status New               OT Long Term Goals - 11/24/20 1710       OT LONG TERM GOAL #1   Title Pt will be independent with updated HEPs    Time 12    Period Weeks    Status New    Target Date 02/16/21      OT LONG TERM GOAL #2   Title Pt will increase grip strength in RUE by 8 lbs or greater for increasein functional use of RUE, dominant, hand.    Baseline R 24.6, L 42.1    Time 12    Period Weeks    Status New      OT LONG TERM GOAL #3   Title Pt will complete 9 hole peg test in 2  minutes or less with RUE for increase in fine motor coordination    Baseline R 8 pegs in 2 min, 30 seconds L 24.63s    Time 12    Period Weeks    Status New      OT LONG TERM GOAL #4   Title Pt will complete physical and cognitive task simultaneously with 90% accuracy or greater    Time 12    Period Weeks    Status New      OT LONG TERM GOAL #5   Title Pt will demonstrate or report increased ease with styling her own hair with bilateral UE.    Time 12    Period Weeks    Status New      Long Term Additional Goals   Additional Long Term Goals Yes      OT LONG TERM GOAL #6   Title Pt and caregiver will verbalize undersatnding for return to driving and return to work recommendations.    Time 12    Period Weeks    Status New      OT LONG TERM GOAL #7   Title Pt will score a 57 or greater on FOTO at discharge.    Baseline 38    Time 12    Period Weeks    Status New                   Plan - 12/18/20 1307     Clinical Impression Statement Pt with increased legibility with handwriting this day.    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 ADL;Decreased knowledge of use of DME;Strength;Dexterity;GMC;Proprioception;UE functional use;ROM;IADL;Vision;Decreased knowledge of precautions;FMC;Sensation;Coordination    Cognitive Skills Problem Solve;Understand;Perception;Thought;Sequencing    Rehab Potential Good    Clinical Decision Making Limited treatment options, no task modification necessary    Comorbidities Affecting Occupational Performance: None  Modification or Assistance to Complete Evaluation  No modification of tasks or assist necessary to complete eval    OT Frequency 2x / week    OT Duration 12 weeks    OT Treatment/Interventions  Self-care/ADL training;Fluidtherapy;DME and/or AE instruction;Therapeutic activities;Cognitive remediation/compensation;Visual/perceptual remediation/compensation;Patient/family education;Passive range of motion;Manual Therapy;Energy conservation;Electrical Stimulation;Neuromuscular education;Building services engineer;Therapeutic exercise    Plan functional use of RUE, sensory reeducation/desensitization strategies    Consulted and Agree with Plan of Care Patient    Family Member Consulted mom, Thelma             Patient will benefit from skilled therapeutic intervention in order to improve the following deficits and impairments:   Body Structure / Function / Physical Skills: ADL, Decreased knowledge of use of DME, Strength, Dexterity, GMC, Proprioception, UE functional use, ROM, IADL, Vision, Decreased knowledge of precautions, FMC, Sensation, Coordination Cognitive Skills: Problem Solve, Understand, Perception, Thought, Sequencing     Visit Diagnosis: Attention and concentration deficit  Muscle weakness (generalized)  Other abnormalities of gait and mobility  Other lack of coordination  Frontal lobe and executive function deficit  Unsteadiness on feet  Hemiplegia and hemiparesis following nontraumatic intracerebral hemorrhage affecting right dominant side [facility])    Problem List Patient Active Problem List   Diagnosis Date Noted   Acute blood loss anemia 11/10/2020   Abnormal LFTs 11/10/2020   Aphasia 10/30/2020   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 [place], [place], [address] Phone: [phone]   Fax:  [phone]  Name: Jane Martin MRN: [id number] Date of Birth: 07-05-1996

## 2020-12-22 ENCOUNTER — Ambulatory Visit: Payer: PRIVATE HEALTH INSURANCE | Admitting: Physical Therapy

## 2020-12-22 ENCOUNTER — Encounter: Payer: Self-pay | Admitting: Occupational Therapy

## 2020-12-22 ENCOUNTER — Ambulatory Visit: Payer: PRIVATE HEALTH INSURANCE | Admitting: Occupational Therapy

## 2020-12-22 ENCOUNTER — Ambulatory Visit: Payer: PRIVATE HEALTH INSURANCE

## 2020-12-22 ENCOUNTER — Other Ambulatory Visit: Payer: Self-pay

## 2020-12-22 DIAGNOSIS — R4701 Aphasia: Secondary | ICD-10-CM

## 2020-12-22 DIAGNOSIS — R41844 Frontal lobe and executive function deficit: Secondary | ICD-10-CM

## 2020-12-22 DIAGNOSIS — R278 Other lack of coordination: Secondary | ICD-10-CM

## 2020-12-22 DIAGNOSIS — R2681 Unsteadiness on feet: Secondary | ICD-10-CM

## 2020-12-22 DIAGNOSIS — M6281 Muscle weakness (generalized): Secondary | ICD-10-CM

## 2020-12-22 DIAGNOSIS — I69390 Apraxia following cerebral infarction: Secondary | ICD-10-CM | POA: Diagnosis not present

## 2020-12-22 DIAGNOSIS — I69151 Hemiplegia and hemiparesis following nontraumatic intracerebral hemorrhage affecting right dominant side: Secondary | ICD-10-CM

## 2020-12-22 DIAGNOSIS — I612 Nontraumatic intracerebral hemorrhage in hemisphere, unspecified: Secondary | ICD-10-CM | POA: Diagnosis not present

## 2020-12-22 DIAGNOSIS — R208 Other disturbances of skin sensation: Secondary | ICD-10-CM

## 2020-12-22 DIAGNOSIS — R2689 Other abnormalities of gait and mobility: Secondary | ICD-10-CM

## 2020-12-22 DIAGNOSIS — R41840 Attention and concentration deficit: Secondary | ICD-10-CM

## 2020-12-22 NOTE — Therapy (Signed)
[facility] [address] [place], [place], [address] Phone: [phone]   Fax:  [phone]  Occupational Therapy Treatment/Goal Check 3/5 STGs Met  Patient Details  Name: Jane Martin MRN: [id number] Date of Birth: 12-06-1995 Referring Provider (OT): f/u w Frann Rider, NP   Encounter Date: 12/22/2020   OT End of Session - 12/22/20 1231     Visit Number 6    Number of Visits 25    Date for OT Re-Evaluation 02/16/21    Authorization Type [facility] UMR    Authorization Time Period VL:MN    OT Start Time 1230    OT Stop Time 1309    OT Time Calculation (min) 39 min    Activity Tolerance Patient tolerated treatment well    Behavior During Therapy [facility] for tasks assessed/performed             Past Medical History:  Diagnosis Date   Stroke ([facility]) 10/07/2020    Past Surgical History:  Procedure Laterality Date   CRANIOTOMY Left 10/17/2020   Procedure: CRANIOTOMY HEMATOMA Evacuation;  Surgeon: Kristeen Miss, MD;  Location: [facility];  Service: Neurosurgery;  Laterality: Left;   IR ANGIO INTRA EXTRACRAN SEL COM CAROTID INNOMINATE BILAT MOD SED  10/29/2020   IR ANGIO VERTEBRAL SEL VERTEBRAL BILAT MOD SED  10/29/2020   IR US GUIDE VASC ACCESS RIGHT  10/29/2020    There were no vitals filed for this visit.   Subjective Assessment - 12/22/20 1231     Subjective  Pt reports no changes and no pain.    Pertinent History None    Limitations Aphasia (expressive and receptive), Absent sensation on R hemi    Patient Stated Goals "to be able to feel myself, do for myself, drive"    Currently in Pain? No/denies             ADLs scrambled egg with supervision and good safety awareness. Pt req'd min cues for using RUE for bimanual tasks of stirring, etc.   Grooved Pegs with RUE with increased time and min to mod difficulty and drops.   Environmental Scanning with 14/16 accuracy = 88% on first pass. Pt required max cues  for locating remaining items on second pass. Both errors in inferior right quadrant.  Red Theraputty - see pt instructions                  OT Education - 12/22/20 1314     Education Details Red Theraputty - see pt instructions    Person(s) Educated Patient    Methods Explanation;Verbal cues;Handout;Demonstration    Comprehension Verbalized understanding;Returned demonstration;Verbal cues required              OT Short Term Goals - 12/22/20 1232       OT SHORT TERM GOAL #1   Title Pt will be independent with HEP for RUE coordination and grip strength    Time 4    Period Weeks    Status Achieved   coordination HEP, theraputty red 12/22/20   Target Date 12/22/20      OT SHORT TERM GOAL #2   Title Pt will verbalize understanding of sensory strategies for increased safety awareness    Time 4    Period Weeks    Status On-going   reviewed sensory strategies for safety, still needing cues 12/22/20     OT SHORT TERM GOAL #3   Title Pt will perform environmental scanning in moderately distracting environment with 90%  accuracy or greater.    Time 4    Period Weeks    Status On-going   88% accuracy 12/22/20     OT SHORT TERM GOAL #4   Title Pt will improve box and blocks score by 4 blocks or more with RUE for increase in overall functional use of RUE.    Baseline R 21 L 51    Time 4    Period Weeks    Status Achieved   RUE 30 blocks 12/22/20     OT SHORT TERM GOAL #5   Title Pt will perform simple warm meal prep with supervision and good safety awareness    Time 4    Period Weeks    Status Achieved   supervision 12/22/20              OT Long Term Goals - 11/24/20 1710       OT LONG TERM GOAL #1   Title Pt will be independent with updated HEPs    Time 12    Period Weeks    Status New    Target Date 02/16/21      OT LONG TERM GOAL #2   Title Pt will increase grip strength in RUE by 8 lbs or greater for increasein functional use of RUE, dominant,  hand.    Baseline R 24.6, L 42.1    Time 12    Period Weeks    Status New      OT LONG TERM GOAL #3   Title Pt will complete 9 hole peg test in 2 minutes or less with RUE for increase in fine motor coordination    Baseline R 8 pegs in 2 min, 30 seconds L 24.63s    Time 12    Period Weeks    Status New      OT LONG TERM GOAL #4   Title Pt will complete physical and cognitive task simultaneously with 90% accuracy or greater    Time 12    Period Weeks    Status New      OT LONG TERM GOAL #5   Title Pt will demonstrate or report increased ease with styling her own hair with bilateral UE.    Time 12    Period Weeks    Status New      Long Term Additional Goals   Additional Long Term Goals Yes      OT LONG TERM GOAL #6   Title Pt and caregiver will verbalize undersatnding for return to driving and return to work recommendations.    Time 12    Period Weeks    Status New      OT LONG TERM GOAL #7   Title Pt will score a 57 or greater on FOTO at discharge.    Baseline 38    Time 12    Period Weeks    Status New                   Plan - 12/22/20 1305     Clinical Impression Statement Pt with good coordination this day but continues with significant lack of sensation in RUE.    OT Occupational Profile and History Problem Focused Assessment - Including review of records relating to presenting problem    Occupational performance deficits (Please refer to evaluation for details): IADL's;ADL's;Leisure;Work    Marketing executive / Function / Physical Skills ADL;Decreased knowledge of use of DME;Strength;Dexterity;GMC;Proprioception;UE functional use;ROM;IADL;Vision;Decreased knowledge of precautions;FMC;Sensation;Coordination    Cognitive  Skills Problem Solve;Understand;Perception;Thought;Sequencing    Rehab Potential Good    Clinical Decision Making Limited treatment options, no task modification necessary    Comorbidities Affecting Occupational Performance: None     Modification or Assistance to Complete Evaluation  No modification of tasks or assist necessary to complete eval    OT Frequency 2x / week    OT Duration 12 weeks    OT Treatment/Interventions Self-care/ADL training;Fluidtherapy;DME and/or AE instruction;Therapeutic activities;Cognitive remediation/compensation;Visual/perceptual remediation/compensation;Patient/family education;Passive range of motion;Manual Therapy;Energy conservation;Electrical Stimulation;Neuromuscular education;Therapist, nutritional;Therapeutic exercise    Plan braiding?, environmental scanning    Consulted and Agree with Plan of Care Patient    Family Member Consulted mom, Thelma             Patient will benefit from skilled therapeutic intervention in order to improve the following deficits and impairments:   Body Structure / Function / Physical Skills: ADL, Decreased knowledge of use of DME, Strength, Dexterity, GMC, Proprioception, UE functional use, ROM, IADL, Vision, Decreased knowledge of precautions, FMC, Sensation, Coordination Cognitive Skills: Problem Solve, Understand, Perception, Thought, Sequencing     Visit Diagnosis: 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)  Attention and concentration deficit  Other abnormalities of gait and mobility  Muscle weakness (generalized)  Other lack of coordination  Frontal lobe and executive function deficit  Unsteadiness on feet    Problem List Patient Active Problem List   Diagnosis Date Noted   Acute blood loss anemia 11/10/2020   Abnormal LFTs 11/10/2020   Aphasia 10/30/2020   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 [place], [place], [address] Phone: [phone]   Fax:  [phone]  Name: Jane Martin MRN: [id number] Date of Birth: [birth year]

## 2020-12-22 NOTE — Patient Instructions (Signed)
1. Grip Strengthening (Resistive Putty)   Squeeze putty using thumb and all fingers. Repeat _20___ times. Do __2__ sessions per day.   2. Roll putty into tube on table and pinch between each finger and thumb x 10 reps each. (can do ring and small finger together)     Copyright  VHI. All rights reserved.   

## 2020-12-22 NOTE — Therapy (Signed)
[facility] [address] [place], [place], [address] Phone: [phone]   Fax:  [phone]  Physical Therapy Treatment  Patient Details  Name: Jane Martin MRN: [id number] Date of Birth: 07/05/1996 Referring Provider (PT): Deatra Ina, PA-C (to see Raulkar)   Encounter Date: 12/22/2020   PT End of Session - 12/22/20 1122     Visit Number 4    Number of Visits 18    Authorization Type UMR/[employer]    PT Start Time 1145    PT Stop Time 1230    PT Time Calculation (min) 45 min    Activity Tolerance Patient tolerated treatment well    Behavior During Therapy [facility] for tasks assessed/performed             Past Medical History:  Diagnosis Date   Stroke ([facility]) 10/07/2020    Past Surgical History:  Procedure Laterality Date   CRANIOTOMY Left 10/17/2020   Procedure: CRANIOTOMY HEMATOMA Evacuation;  Surgeon: Barnett Abu, MD;  Location: [facility] OR;  Service: Neurosurgery;  Laterality: Left;   IR ANGIO INTRA EXTRACRAN SEL COM CAROTID INNOMINATE BILAT MOD SED  10/29/2020   IR ANGIO VERTEBRAL SEL VERTEBRAL BILAT MOD SED  10/29/2020   IR US GUIDE VASC ACCESS RIGHT  10/29/2020    There were no vitals filed for this visit.   Subjective Assessment - 12/22/20 1146     Subjective Pt reports her R side continues to be numb. Able to do exercises at home. Pt reports doing steps at home and walking outside (~5 min). She reports no issues going around grocery store with her mom.    Pertinent History No significant PMH    Patient Stated Goals Pt's goal for therapy are for RLE to stop being numb and get back to normal.    Currently in Pain? No/denies                               [facility] Adult PT Treatment/Exercise - 12/22/20 0001       Ambulation/Gait   Ambulation/Gait Assistance 7: Independent    Assistive device None    Gait Pattern Step-through pattern    Ambulation Surface Level;Indoor      Neuro  Re-ed    Neuro Re-ed Details  Single leg tapping on step 1, 2, and 3 2x10 each standing on foam      Knee/Hip Exercises: Aerobic   Stepper Elliptical level 2; forward x2 min, backward x 2 min      Knee/Hip Exercises: Machines for Strengthening   Total Gym Leg Press Double leg 100# 2x10; 50# single leg 3x10   cues to keep from hyperextension     Knee/Hip Exercises: Standing   Forward Lunges Both;2 sets;10 reps    Other Standing Knee Exercises Palloff press 2x10 in tandem stance green tband (R LE back)    Other Standing Knee Exercises lateral band walk green tband 3x10; backward/forward monster walk green tband 3x10                    PT Education - 12/22/20 1240     Education Details Discussed sensitization techniques at home to try to improve sensation if possible. Discussed HEP updates    Person(s) Educated Patient    Methods Explanation;Demonstration;Tactile cues;Verbal cues;Handout    Comprehension Verbalized understanding;Returned demonstration;Verbal cues required;Tactile cues required  PT Short Term Goals - 11/26/20 1713       PT SHORT TERM GOAL #1   Title Pt will be independent with HEP for improved strength, balance, and gait.  TARGET 12/26/2020    Time 5    Period Weeks    Status New      PT SHORT TERM GOAL #2   Title Pt will perform 5x sit<>stand in less than or equal to 14 sec for improved transfer efficiency and functional strength.    Baseline 17.09    Time 5    Period Weeks    Status New      PT SHORT TERM GOAL #3   Title Pt will improve single limb stance on RLE to at least 10 sec for improved balance, functional strength.    Baseline 3.5 sec    Time 5    Period Weeks    Status New      PT SHORT TERM GOAL #4   Title Pt/family will verbalize understanding of fall prevention in home environment.    Time 5    Period Weeks    Status New      PT SHORT TERM GOAL #5   Title 6 minute walk test to improve to at least 1700 ft for  improved gait efficiency over longer distances.    Baseline 1589 ft    Time 5    Period Weeks               PT Long Term Goals - 11/24/20 1518       PT LONG TERM GOAL #1   Title Pt will be independent with progression of HEP for improved strength, balance, and gait.  TARGET 01/23/2021    Time 9    Period Weeks    Status New      PT LONG TERM GOAL #2   Title Pt will improve 5x sit<>stand to less than or equal to 12 sec to demonstrate improved functional strength and transfer efficiency.    Time 9    Period Weeks    Status New      PT LONG TERM GOAL #3   Title Pt will improve FGA score to at least 24/30 to decrease fall risk.    Baseline 19/30    Time 9    Period Weeks    Status New      PT LONG TERM GOAL #4   Title Pt will improve gait velocity to at least 4 ft/sec for improved gait efficiency and safety.    Baseline 3.28 ft/sec (pt reports always being a fast walker)    Time 9    Period Weeks    Status New      PT LONG TERM GOAL #5   Title Pt will ambulate at least 1000 ft, indoor and outdoor surfaces, no LOB, independently, for improved community gait.    Time 9    Period Weeks    Status New      Additional Long Term Goals   Additional Long Term Goals Yes      PT LONG TERM GOAL #6   Title Pt will improve FOTO to at least 81% (predicted FOTO at d/c) to demo improved overall functional mobility.    Baseline 68%    Time 9    Period Weeks    Status New                   Plan - 12/22/20 1152  Clinical Impression Statement Treatment focused on R LE strength, endurance, and stability. Initiated desensitization techniques to attempt to assist with her decreased sensation. Updated and progressed pt's HEP accordingly.    Personal Factors and Comorbidities Other   No pertient PMH; severity of deficits, including aphasia   Examination-Activity Limitations Locomotion Level;Transfers;Stairs    Examination-Participation Restrictions Occupation;Community  Activity;Driving    Stability/Clinical Decision Making Evolving/Moderate complexity    Rehab Potential Good    PT Frequency 2x / week   2x/wk x 8 weeks   PT Duration Other (comment)   plus 1x/wk on eval week; 9 week POC   PT Treatment/Interventions ADLs/Self Care Home Management;Gait training;Stair training;Functional mobility training;Therapeutic activities;Therapeutic exercise;Balance training;Neuromuscular re-education;Electrical Stimulation;Patient/family education;Manual techniques;Passive range of motion    PT Next Visit Plan Check STGs. 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 [place], [place], [address] Phone: [phone]   Fax:  [phone]  Name: Jane Martin MRN: [id number] Date of Birth: May 05, 1996

## 2020-12-22 NOTE — Therapy (Signed)
[facility] [address] [place], [place], [address] Phone: [phone]   Fax:  [phone]  Speech Language Pathology Treatment  Patient Details  Name: Jane Martin MRN: [id number] Date of Birth: 05/11/1996 Referring Provider (SLP): Dr. Sula Soda   Encounter Date: 12/22/2020   End of Session - 12/22/20 1115     Visit Number 7    Number of Visits 25    Date for SLP Re-Evaluation 02/16/21    Authorization Type no auth; 130 VL PT/OT/ST combined    SLP Start Time 1102    SLP Stop Time  1145    SLP Time Calculation (min) 43 min    Activity Tolerance Patient tolerated treatment well             Past Medical History:  Diagnosis Date   Stroke (H[facility]) 10/07/2020    Past Surgical History:  Procedure Laterality Date   CRANIOTOMY Left 10/17/2020   Procedure: CRANIOTOMY HEMATOMA Evacuation;  Surgeon: Barnett Abu, MD;  [facility];  Service: Neurosurgery;  Laterality: Left;   IR ANGIO INTRA EXTRACRAN SEL COM CAROTID INNOMINATE BILAT MOD SED  10/29/2020   IR ANGIO VERTEBRAL SEL VERTEBRAL BILAT MOD SED  10/29/2020   IR US GUIDE VASC ACCESS RIGHT  10/29/2020    There were no vitals filed for this visit.   Subjective Assessment - 12/22/20 1104     Subjective "I forgot my homework at home"    Currently in Pain? No/denies                   ADULT SLP TREATMENT - 12/22/20 1105       General Information   Behavior/Cognition Alert;Cooperative;Pleasant mood      Treatment Provided   Treatment provided Cognitive-Linquistic      Cognitive-Linquistic Treatment   Treatment focused on Aphasia;Patient/family/caregiver education    Skilled Treatment Pt reports snonym hwk is challenging but effective. SLP targeted verbal descriptions given targeted word, with usual min to mod questioning cues required to elaborate on response to specify details. Occasional anomia noted, in which pt able to correct with  additional processing time and occasional semantic and phonemic cues. Pt generated 10 words in personally relevant categores with rare min A and additional processing time. Good use of description strategy noted to cue SLP to targeted word.      Assessment / Recommendations / Plan   Plan Continue with current plan of care      Progression Toward Goals   Progression toward goals Progressing toward goals              SLP Education - 12/22/20 1304     Education Details synonym and description strategy    Person(s) Educated Patient    Methods Explanation;Demonstration    Comprehension Verbalized understanding;Returned demonstration;Need further instruction              SLP Short Term Goals - 12/22/20 1115       SLP SHORT TERM GOAL #1   Title Pt will name 10 items in persoanlly relevant category with rare min A over 2 sessions    Baseline 12-22-20    Time 2    Period Weeks    Status On-going      SLP SHORT TERM GOAL #2   Title Pt will utilize 2 compensatory strategies for aphasia in structured language tasks 8/10 opportunities with occasional min A over 2 sessions    Baseline 12-22-20    Time 2  Period Weeks    Status On-going      SLP SHORT TERM GOAL #3   Title Pt will comprehend 3 sentence paragraph with rare min A over 2 sessions    Time 2    Period Weeks    Status On-going      SLP SHORT TERM GOAL #4   Title Pt will write at sentence level, IDing errors with occasional min 8/10 errors    Baseline 12-15-20    Time 2    Period Weeks    Status On-going      SLP SHORT TERM GOAL #5   Title Pt will carryover compensations for aphasia during 12 minute simple conversation as needed with occasional min A    Time 2    Period Weeks    Status On-going              SLP Long Term Goals - 12/22/20 1116       SLP LONG TERM GOAL #1   Title Pt will complete complex naming tasks with 90% accuracy and occasional min A over 2 sessions    Time 8    Period Weeks     Status On-going      SLP LONG TERM GOAL #2   Title Pt will comprehend 3 paragraph passages of interest to her, restating 2 salient details from each paragraph with occasional min A    Time 8    Period Weeks    Status On-going      SLP LONG TERM GOAL #3   Title Pt will ID and correct errors on texts, e mails and other 4 sentence written communications 9/10 with occasional min A    Time 8    Period Weeks    Status On-going      SLP LONG TERM GOAL #4   Title Pt will carryover compensations for aphasia as needed over 20 minute moderately complex conversation with occasional min A over 2 sessions    Time 8    Period Weeks    Status On-going      SLP LONG TERM GOAL #5   Title Pt will improve score on The Communicative Participation Item Bank by 4 points    Time 8    Period Weeks    Status On-going              Plan - 12/22/20 1136     Clinical Impression Statement Jane Martin is referred to outpt ST s/p Left ICH due to aphasia. SLP targeted use of description strategey for anomia, with occasional additional processing time and min to mod cues required to verbalize sufficient description. Education provided re: various therapeutic tasks targeting word finding. At this time, Jane Martin can not return to her previous job due to aphasia. I recommend skilled ST to maximize communication for safety, effective and efficient participation in conversations and for possible return to the workforce.    Speech Therapy Frequency 2x / week    Duration 12 weeks   25 visits   Treatment/Interventions SLP instruction and feedback;Environmental controls;Language facilitation;Cueing hierarchy;Compensatory strategies;Functional tasks;Cognitive reorganization;Compensatory techniques;Patient/family education;Multimodal communcation approach;Internal/external aids    Potential to Achieve Goals Good    SLP Home Exercise Plan provided    Consulted and Agree with Plan of Care Patient              Patient will benefit from skilled therapeutic intervention in order to improve the following deficits and impairments:   Aphasia    Problem List Patient  Active Problem List   Diagnosis Date Noted   Acute blood loss anemia 11/10/2020   Abnormal LFTs 11/10/2020   Aphasia 10/30/2020   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 [place], [place], [address] Phone: [phone]   Fax:  [phone]   Name: Jane Martin MRN: [id number] Date of Birth: [birth year]

## 2020-12-22 NOTE — Patient Instructions (Signed)
[email or web address]://www.miota.org/[email or web address]_Desensitization_Program.pdf 

## 2020-12-23 ENCOUNTER — Encounter: Payer: Self-pay | Admitting: Adult Health

## 2020-12-23 ENCOUNTER — Ambulatory Visit (INDEPENDENT_AMBULATORY_CARE_PROVIDER_SITE_OTHER): Payer: 59 | Admitting: Adult Health

## 2020-12-23 VITALS — BP 108/76 | HR 80 | Ht 65.0 in | Wt 145.0 lb

## 2020-12-23 DIAGNOSIS — I619 Nontraumatic intracerebral hemorrhage, unspecified: Secondary | ICD-10-CM | POA: Diagnosis not present

## 2020-12-23 DIAGNOSIS — R4701 Aphasia: Secondary | ICD-10-CM

## 2020-12-23 DIAGNOSIS — R448 Other symptoms and signs involving general sensations and perceptions: Secondary | ICD-10-CM | POA: Diagnosis not present

## 2020-12-23 NOTE — Progress Notes (Signed)
[facility] [address] [place]. [place] [address] [phone]       HOSPITAL FOLLOW UP NOTE  [name] Date of Birth:  [birth year] Medical Record Number:  [id number]   Reason for Referral:  hospital stroke follow up    SUBJECTIVE:   CHIEF COMPLAINT:  Chief Complaint  Patient presents with   Follow-up    Corner rm with mother Jane Martin  Pt is well, has some R sided numbness and occasional speech difficulty     HPI:   [name] is a 25 y.o. female with no history, presented to ED on 10/17/2020 by EMS after collapsing at a gas station. It was told that she yelled out for help and for someone to call 911, then collapsed to the ground where she was found to be unresponsive with fixed gaze, right hemiplegia and agonal breathing. When EMS arrived, she was unresponsive which worsened to comatose requiring intubation in the ED.  Personally reviewed hospitalization pertinent progress notes, lab work and imaging with summary provided.  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  Placed on Keppra s/p craniotomy for seizure prevention and eventually discontinued on 5/3.  Cerebral angiogram showed no evidence of AVM, aneurysm or stenosis did show some irregularity of annular branch of left-MCA M4 region question due to mass defect or spasms.  Dr. Danielle Martin noted a "small cluster tissue found that was cauterized and removed in a singular piece. After this it seemed that the hemorrhage subsided considerably".  Pathology showed benign brain parenchyma.  Plan to follow with Dr. Corliss Martin outpatient 6 weeks postprocedure.  Other stroke risk factors include use of birth control, questionable hypertension, THC, COVID-positive, and family history of stroke.  Hospital course complicated by acute blood loss anemia, asymptomatic bradycardia, agitation and urinary retention.  Residual deficits of expressive > receptive  aphasia, right-sided weakness and dysphagia.  Discharged to CIR on 4/28 - 5/5.   Today, 12/23/2020, Jane Martin is being seen for hospital follow-up accompanied by her mother.  She reports residual right-sided numbness/tingling, mild right-sided weakness, speech difficulty and reading difficulty and processing.  Denies residual dysphagia.  She continues to work with neuro rehab PT/OT/SLP with continued recovery.  She is able to maintain ADLs independently and has been working with OT relearning IADLs.  She is currently living with her mother which was her living situation prior.  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  Denies new or worsening stroke/TIA symptoms.  She has been seen by Dr. Danielle Martin since discharge and has follow-up appointment in October with plans on repeating MRI per mother (unable to personally view via epic).  Follows with Dr. Kieth Martin with plans on neurocognitive evaluation in July.  Has not yet had follow-up with Dr. Corliss Martin.  She has since completely stopped vaping.  Denies any EtOH or drug use.  Denies use of birth control even prior to recent ICH.  No further concerns at this time.     ROS:   14 system review of systems performed and negative with exception of those listed in HPI  PMH:  Past Medical History:  Diagnosis Date   Stroke (HCC) 10/07/2020    PSH:  Past Surgical History:  Procedure Laterality Date   CRANIOTOMY Left 10/17/2020   Procedure: CRANIOTOMY HEMATOMA Evacuation;  Surgeon: Jane Abu, MD;  Location: [facility] OR;  Service: Neurosurgery;  Laterality: Left;   IR ANGIO INTRA EXTRACRAN  SEL COM CAROTID INNOMINATE BILAT MOD SED  10/29/2020   IR ANGIO VERTEBRAL SEL VERTEBRAL BILAT MOD SED  10/29/2020   IR US GUIDE VASC ACCESS RIGHT  10/29/2020    Social History:  Social History   Socioeconomic History   Marital status: Single    Spouse name: Not on file   Number of children: Not on file   Years of education: Not on file   Highest  education level: Not on file  Occupational History   Not on file  Tobacco Use   Smoking status: Never   Smokeless tobacco: Never  Vaping Use   Vaping Use: Never used  Substance and Sexual Activity   Alcohol use: Yes   Drug use: Yes    Types: Marijuana   Sexual activity: Not on file  Other Topics Concern   Not on file  Social History Narrative   Not on file   Social Determinants of Health   Financial Resource Strain: Not on file  Food Insecurity: Not on file  Transportation Needs: Not on file  Physical Activity: Not on file  Stress: Not on file  Social Connections: Not on file  Intimate Partner Violence: Not on file    Family History:  Family History  Problem Relation Age of Onset   Hypertension Mother    Intracerebral hemorrhage Mother        due to HTN   Healthy Father     Medications:   Current Outpatient Medications on File Prior to Visit  Medication Sig Dispense Refill   acetaminophen (TYLENOL) 325 MG tablet Take 2 tablets (650 mg total) by mouth every 6 (six) hours as needed for mild pain (or temp > 37.5 C (99.5 F)).     artificial tears (LACRILUBE) OINT ophthalmic ointment Place 1 application into both eyes as needed (apply to protect eyes from lash extensions).     metroNIDAZOLE (METROGEL VAGINAL) 0.75 % vaginal gel Place 1 Applicatorful vaginally at bedtime for 5 days. 50 g 0   No current facility-administered medications on file prior to visit.    Allergies:   Allergies  Allergen Reactions   Flagyl [Metronidazole]       OBJECTIVE:  Physical Exam  Vitals:   12/23/20 0910  BP: 108/76  Pulse: 80  Weight: 145 lb (65.8 kg)  Height: 5\' 5"  (1.651 m)   Body mass index is 24.13 kg/m. No results found.  Post stroke PHQ 2/9 Depression screen PHQ 2/9 12/23/2020  Decreased Interest 0  Down, Depressed, Hopeless 0  PHQ - 2 Score 0     General: well developed, well nourished, very pleasant young African-American female, seated, in no evident  distress Head: head normocephalic and atraumatic.   Neck: supple with no carotid or supraclavicular bruits Cardiovascular: regular rate and rhythm, no murmurs Musculoskeletal: no deformity Skin:  no rash/petichiae Vascular:  Normal pulses all extremities   Neurologic Exam Mental Status: Awake and fully alert.  Mild to moderate expressive aphasia.  Some difficulty following simple step commands.  Oriented to place and time. Recent and remote memory intact. Attention span, concentration and fund of knowledge mostly appropriate during visit with mother providing some history. Mood and affect appropriate.  Cranial Nerves: Fundoscopic exam reveals sharp disc margins. Pupils equal, briskly reactive to light. Extraocular movements full without nystagmus. Visual fields full to confrontation. Hearing intact. Facial sensation intact. Face, tongue, palate moves normally and symmetrically.  Motor: Normal bulk and tone. Normal strength in all tested extremity muscles Sensory.:  Minimal  sensation right upper and lower extremity compared to left side Coordination: Rapid alternating movements normal in all extremities. Finger-to-nose and heel-to-shin right-sided apraxia Gait and Station: Arises from chair without difficulty. Stance is normal. Gait demonstrates normal stride length and balance without use of assistive device. Tandem walk and heel toe without difficulty.  Romberg negative Reflexes: 1+ and symmetric. Toes downgoing.     NIHSS  2 Modified Rankin  2      ASSESSMENT: MACKYNZIE WOOLFORD is a 25 y.o. 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 Vascular risk factors include birth control use, questionable HTN, COVID-positive, vaping and family history of stroke.      PLAN:  Left parietal ICH:  Residual deficit: Right hemisensory impairment, right-sided apraxia, expressive> receptive aphasia  and processing difficulties.  Compared to hospitalization, she has been making excellent improvement and encouraged continued participation with neuro rehab for likely further recovery.  Unfortunately, she did not have short-term disability coverage through her current employer. No prior history of ischemic stroke or extensive vascular risk factors -no indication of initiating aspirin and discussed importance of avoiding aspirin containing products and NSAIDs F/u with Dr. Danielle Martin in 04/2021 for repeat MRI brain Will reach out to IR schedulers to further assist with scheduling follow-up visit with Dr. Corliss Martin for repeat CTA head/neck as 6-week follow-up recommended Discussed secondary stroke prevention measures and importance of close PCP follow up for aggressive stroke risk factor management  Will assist with completion of insurance paperwork requested by mother     Follow up in 3 months or call earlier if needed   CC:  [facility] provider: Dr. Pearlean Brownie PCP: Obgyn, Ma Hillock    I spent 58 minutes of face-to-face and non-face-to-face time with patient and mother.  This included previsit chart review including extensive review of hospitalization, lab review, study review, electronic health record documentation, and extensive patient and mother discussion and education regarding recent stroke of unknown etiology, secondary stroke prevention measures and importance of routine follow-up with other providers, residual deficits and typical recovery time and answered all other questions to patient and mother satisfaction  Jane Martin, AGNP-BC  [facility] [address] [place], [place] [address]  Phone [phone] Fax [phone] Note: This document was prepared with digital dictation and possible smart phrase technology. Any transcriptional errors that result from this process are unintentional.

## 2020-12-23 NOTE — Patient Instructions (Signed)
Continue working with therapies for likely ongoing recovery -- keep up the great work!! You are making excellent progress!  Follow-up with Dr. Danielle Dess in October for repeat MRI  You should be contacted by Dr. Fatima Sanger office to schedule follow-up CTA head/neck     Followup in the future with me in 3 months or call earlier if needed     Thank you for coming to see Jane Martin at [facility]. I hope we have been able to provide you high quality care today.  You may receive a patient satisfaction survey over the next few weeks. We would appreciate your feedback and comments so that we may continue to improve ourselves and the health of our patients.    Hemorrhagic Stroke  A hemorrhagic stroke happens when a blood vessel in the brain leaks or bursts (ruptures). This causes bleeding in or around the brain (hemorrhage) and leads to the sudden death of brain tissue. A hemorrhagic stroke is amedical emergency. It can cause brain damage and death. There are two major types of hemorrhagic stroke: Intracerebral hemorrhage. This happens when bleeding occurs within the brain tissue. Subarachnoid hemorrhage. This happens when bleeding occurs in the area between the brain and the membrane that covers the brain (subarachnoid space). What are the causes? This condition may be caused by: Head injury (trauma). Part of a weakened blood vessel wall bulging or ballooning out (cerebral aneurysm). Thin and hardened blood vessels due to plaque buildup. Tangled blood vessels in the brain (arteriovenous malformation). Protein buildup on the artery walls of the brain (amyloid angiopathy). Inflamed blood vessels (vasculitis). A brain tumor. Sometimes, the cause of this condition is not known. What increases the risk? The following factors may make you more likely to develop this condition: Hypertension. Having abnormal blood vessels present since birth (congenital abnormality). Bleeding  disorders, such as hemophilia, sickle cell disease, or liver disease. Blood thinners (anticoagulants) that make the blood too thin. Being an older adult. Moderate or heavy alcohol use. Using drugs, such as cocaine or methamphetamines. What are the signs or symptoms? Symptoms of this condition include: The sudden onset of: Weakness or numbness of the face, arm, or leg, especially on one side of the body. Confusion. Trouble speaking or understanding speech. Difficulty seeing in one or both eyes. Difficulty walking or moving the arms or legs. Dizziness, or loss of balance or coordination. Nausea and vomiting. A severe headache with no known cause. This headache may feel like the worst one a person has ever had. Seizures. How is this diagnosed? This condition may be diagnosed based on: Your symptoms, medical history, and a physical exam. Tests, including: Blood tests. CT scan. MRI. CT angiography (CTA) or magnetic resonance angiography (MRA). Catheter angiogram. In this procedure, dye is injected through a long, thin tube (catheter) into one of your arteries. X-rays are taken and will show whether a blockage or a problem in a blood vessel exists. How is this treated? The goals of treatment are to stop the bleeding, reduce pressure on the brain, relieve symptoms, and prevent complications. Treatment may include: Medicines that do the following: Lower blood pressure (antihypertensives). Relieve pain (analgesics), fever, nausea, or vomiting. Stop or prevent seizures (anticonvulsants). Prevent blood vessels in the brain from spasming in response to bleeding. Control bleeding in the brain. Use of a machine to help you breathe (ventilator). A blood transfusion to help your blood clot. Placement of a tube (shunt) in the brain to relieve pressure. Physical, speech, or occupational therapy. Surgery  to stop the bleeding, remove a blood clot or tumor, or reduce pressure. Treatment depends on  the cause, severity, and duration of symptoms. Talk withyour health care provider about what to expect during your recovery. Follow these instructions at home: Activity Use a walker or a cane as told by your health care provider. Return to your normal activities as told by your health care provider. Ask your health care provider what activities are safe for you. Rest to help your brain heal. Make sure you: Get plenty of sleep. Avoid staying up late at night. Keep to a sleep schedule. Go to sleep and wake up at about the same time every day. Avoid activities that cause physical or mental stress. Lifestyle Do not drink alcohol if: Your health care provider tells you not to drink. You are pregnant, may be pregnant, or are planning to become pregnant. If you drink alcohol: Limit how much you use to: 0-1 drink a day for women. 0-2 drinks a day for men. Know how much alcohol is in your drink. In the [place] one drink equals one 12 oz bottle of beer (355 mL), one 5 oz glass of wine (148 mL), or one 1 oz glass of hard liquor (44 mL). General instructions Do not use any products that contain nicotine or tobacco. These include cigarettes, chewing tobacco, and vaping devices, such as e-cigarettes. If you need help quitting, ask your health care provider. Do not drive or use heavy machinery until your health care provider approves. Take over-the-counter and prescription medicines only as told by your health care provider. Keep all follow-up visits, including those with therapists. This is important. How is this prevented? You can reduce your risk of stroke by managing conditions, such as: High blood pressure. High cholesterol. Diabetes. Heart disease. Obesity. Other factors and lifestyle changes that can lower your risk include: Quitting smoking, limiting alcohol, and staying physically active. Having your bloodwork monitored frequently if you take anticoagulants (blood thinners). Contact a  health care provider if: You develop any of the following symptoms: Headaches that keep coming back (that are chronic). Nausea. Vision problems. Increased sensitivity to noise or light. Depression, mood swings, anxiety, or irritability. Memory problems. Difficulty concentrating or paying attention. Sleep problems. Feeling tired all of the time. Get help right away if: You have a partial or total loss of consciousness. You are taking blood thinners and you fall or have a minor injury to the head. You have a bleeding disorder and you fall, or you have a minor trauma to the head. You have any symptoms of a stroke. "BE FAST" is an easy way to remember the main warning signs of a stroke: B - Balance. Signs are dizziness, sudden trouble walking, or loss of balance. E - Eyes. Signs are trouble seeing or a sudden change in vision. F - Face. Signs are sudden weakness or numbness of the face, or the face or eyelid drooping on one side. A - Arms. Signs are weakness or numbness in an arm. This happens suddenly and usually on one side of the body. S - Speech. Signs are sudden trouble speaking, slurred speech, or trouble understanding what people say. T - Time. Time to call emergency services. Write down what time symptoms started. You have other signs of a stroke, such as: A sudden, severe headache with no known cause. Nausea or vomiting. Seizure. These symptoms may represent a serious problem that is an emergency. Do not wait to see if the symptoms will go away.  Get medical help right away. Call your local emergency services (911 in the [place] Do not drive yourself to the hospital. Summary Hemorrhagic stroke is caused by bleeding in or around the brain. Hemorrhagic stroke is a medical emergency. Know the signs and symptoms of stroke. You can reduce your risk of stroke by managing conditions, quitting smoking, and making other lifestyle changes. This information is not intended to replace advice  given to you by your health care provider. Make sure you discuss any questions you have with your healthcare provider. Document Revised: 03/25/2020 Document Reviewed: 03/25/2020 Elsevier Patient Education  2022 [employer].

## 2020-12-25 ENCOUNTER — Ambulatory Visit: Payer: PRIVATE HEALTH INSURANCE | Admitting: Occupational Therapy

## 2020-12-25 ENCOUNTER — Ambulatory Visit: Payer: PRIVATE HEALTH INSURANCE

## 2020-12-25 ENCOUNTER — Other Ambulatory Visit: Payer: Self-pay

## 2020-12-25 ENCOUNTER — Ambulatory Visit: Payer: PRIVATE HEALTH INSURANCE | Admitting: Physical Therapy

## 2020-12-25 VITALS — BP 110/77 | HR 87

## 2020-12-25 DIAGNOSIS — I69151 Hemiplegia and hemiparesis following nontraumatic intracerebral hemorrhage affecting right dominant side: Secondary | ICD-10-CM

## 2020-12-25 DIAGNOSIS — R4701 Aphasia: Secondary | ICD-10-CM

## 2020-12-25 DIAGNOSIS — R2681 Unsteadiness on feet: Secondary | ICD-10-CM

## 2020-12-25 DIAGNOSIS — R41844 Frontal lobe and executive function deficit: Secondary | ICD-10-CM

## 2020-12-25 DIAGNOSIS — I612 Nontraumatic intracerebral hemorrhage in hemisphere, unspecified: Secondary | ICD-10-CM | POA: Diagnosis not present

## 2020-12-25 DIAGNOSIS — R41840 Attention and concentration deficit: Secondary | ICD-10-CM

## 2020-12-25 DIAGNOSIS — M6281 Muscle weakness (generalized): Secondary | ICD-10-CM

## 2020-12-25 DIAGNOSIS — R208 Other disturbances of skin sensation: Secondary | ICD-10-CM

## 2020-12-25 DIAGNOSIS — R278 Other lack of coordination: Secondary | ICD-10-CM

## 2020-12-25 DIAGNOSIS — R2689 Other abnormalities of gait and mobility: Secondary | ICD-10-CM

## 2020-12-25 DIAGNOSIS — I69390 Apraxia following cerebral infarction: Secondary | ICD-10-CM | POA: Diagnosis not present

## 2020-12-25 NOTE — Therapy (Signed)
[facility] [address] [place], [place], [address] Phone: [phone]   Fax:  [phone]  Speech Language Pathology Treatment  Patient Details  Name: Jane Martin MRN: [id number] Date of Birth: 1996/01/16 Referring Provider (SLP): Dr. Sula Soda   Encounter Date: 12/25/2020   End of Session - 12/25/20 1013     Visit Number 8    Number of Visits 25    Date for SLP Re-Evaluation 02/16/21    Authorization Type no auth; 130 VL PT/OT/ST combined    SLP Start Time 1015    SLP Stop Time  1100    SLP Time Calculation (min) 45 min    Activity Tolerance Patient tolerated treatment well             Past Medical History:  Diagnosis Date   Stroke (HCC) 10/07/2020    Past Surgical History:  Procedure Laterality Date   CRANIOTOMY Left 10/17/2020   Procedure: CRANIOTOMY HEMATOMA Evacuation;  Surgeon: Barnett Abu, MD;  Location: [facility] OR;  Service: Neurosurgery;  Laterality: Left;   IR ANGIO INTRA EXTRACRAN SEL COM CAROTID INNOMINATE BILAT MOD SED  10/29/2020   IR ANGIO VERTEBRAL SEL VERTEBRAL BILAT MOD SED  10/29/2020   IR US GUIDE VASC ACCESS RIGHT  10/29/2020    There were no vitals filed for this visit.   Subjective Assessment - 12/25/20 1015     Subjective "I had trouble reading out loud"    Currently in Pain? No/denies                   ADULT SLP TREATMENT - 12/25/20 1013       General Information   Behavior/Cognition Alert;Cooperative;Pleasant mood      Treatment Provided   Treatment provided Cognitive-Linquistic      Cognitive-Linquistic Treatment   Treatment focused on Aphasia;Patient/family/caregiver education    Skilled Treatment Pt completed HEP for naming fill in blanks within paragraph. Pt endorsed good ability to name targeted words but increased difficulty with naming while reading aloud reported. SLP targeted reading aloud paragraphs, in which pt noted with occasional semantic  and phonemic paraphasias. SLP assessed comprehension of paragraph information, with good verbal retell and comprehension exhibited with rare cues required for dysnomia. Occasional fading to rare phonemic cues required to correct errors. Inconsistent awareness of errors/substitutions noted, with pt benefiting from min verbal prompt to aid awareness. SLP recommended pt read aloud 5-10 mins twice a day with niece to help with error awareness and corrections as needed.      Assessment / Recommendations / Plan   Plan Continue with current plan of care      Progression Toward Goals   Progression toward goals Progressing toward goals              SLP Education - 12/25/20 1039     Education Details reading aloud practice, bigger font to cue each word    Person(s) Educated Patient    Methods Explanation;Demonstration;Handout    Comprehension Verbalized understanding;Returned demonstration;Need further instruction              SLP Short Term Goals - 12/25/20 1014       SLP SHORT TERM GOAL #1   Title Pt will name 10 items in persoanlly relevant category with rare min A over 2 sessions    Baseline 12-22-20    Time 2    Period Weeks    Status On-going      SLP SHORT TERM GOAL #  2   Title Pt will utilize 2 compensatory strategies for aphasia in structured language tasks 8/10 opportunities with occasional min A over 2 sessions    Baseline 12-22-20    Time 2    Period Weeks    Status On-going      SLP SHORT TERM GOAL #3   Title Pt will comprehend 3 sentence paragraph with rare min A over 2 sessions    Baseline 12-25-20    Time 2    Period Weeks    Status On-going      SLP SHORT TERM GOAL #4   Title Pt will write at sentence level, IDing errors with occasional min 8/10 errors    Baseline 12-15-20    Time 2    Period Weeks    Status On-going      SLP SHORT TERM GOAL #5   Title Pt will carryover compensations for aphasia during 12 minute simple conversation as needed with  occasional min A    Time 2    Period Weeks    Status On-going              SLP Long Term Goals - 12/25/20 1014       SLP LONG TERM GOAL #1   Title Pt will complete complex naming tasks with 90% accuracy and occasional min A over 2 sessions    Time 8    Period Weeks    Status On-going      SLP LONG TERM GOAL #2   Title Pt will comprehend 3 paragraph passages of interest to her, restating 2 salient details from each paragraph with occasional min A    Time 8    Period Weeks    Status On-going      SLP LONG TERM GOAL #3   Title Pt will ID and correct errors on texts, e mails and other 4 sentence written communications 9/10 with occasional min A    Time 8    Period Weeks    Status On-going      SLP LONG TERM GOAL #4   Title Pt will carryover compensations for aphasia as needed over 20 minute moderately complex conversation with occasional min A over 2 sessions    Time 8    Period Weeks    Status On-going      SLP LONG TERM GOAL #5   Title Pt will improve score on The Communicative Participation Item Bank by 4 points    Time 8    Period Weeks    Status On-going              Plan - 12/25/20 1059     Clinical Impression Statement Jane Martin is referred to outpt ST s/p Left ICH due to aphasia. Pt indicated increased difficulty naming while reading aloud. SLP targeted paragraph level tasks. Inconsistent awareness of errors (semantic and phonemic) noted, which improved with min verbal promting and occasional phonemic cues to correct errors. At this time, Jane Martin can not return to her previous job due to aphasia. I recommend skilled ST to maximize communication for safety, effective and efficient participation in conversations and for possible return to the workforce.    Speech Therapy Frequency 2x / week    Duration 12 weeks   25 visits   Treatment/Interventions SLP instruction and feedback;Environmental controls;Language facilitation;Cueing hierarchy;Compensatory  strategies;Functional tasks;Cognitive reorganization;Compensatory techniques;Patient/family education;Multimodal communcation approach;Internal/external aids    Potential to Achieve Goals Good    SLP Home Exercise Plan provided  Consulted and Agree with Plan of Care Patient             Patient will benefit from skilled therapeutic intervention in order to improve the following deficits and impairments:   Aphasia    Problem List Patient Active Problem List   Diagnosis Date Noted   Acute blood loss anemia 11/10/2020   Abnormal LFTs 11/10/2020   Aphasia 10/30/2020   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 [address] [place], [place], [address] Phone: [phone]   Fax:  [phone]   Name: Jane Martin MRN: [id number] Date of Birth: 1996/03/18

## 2020-12-25 NOTE — Therapy (Addendum)
[facility] [address] [place], [place], [address] Phone: [phone]   Fax:  [phone]  Occupational Therapy Treatment  Patient Details  Name: Jane Martin MRN: [id number] Date of Birth: 1996-04-23 Referring Provider (OT): f/u w Ihor Austin, NP   Encounter Date: 12/25/2020   OT End of Session - 12/25/20 1012     Visit Number 7    Number of Visits 25    Date for OT Re-Evaluation 02/16/21    Authorization Type [facility] UMR    OT Start Time 0934    OT Stop Time 1014    OT Time Calculation (min) 40 min             Past Medical History:  Diagnosis Date   Stroke (HCC) 10/07/2020    Past Surgical History:  Procedure Laterality Date   CRANIOTOMY Left 10/17/2020   Procedure: CRANIOTOMY HEMATOMA Evacuation;  Surgeon: Barnett Abu, MD;  Location: [facility] OR;  Service: Neurosurgery;  Laterality: Left;   IR ANGIO INTRA EXTRACRAN SEL COM CAROTID INNOMINATE BILAT MOD SED  10/29/2020   IR ANGIO VERTEBRAL SEL VERTEBRAL BILAT MOD SED  10/29/2020   IR US GUIDE VASC ACCESS RIGHT  10/29/2020    There were no vitals filed for this visit.   Subjective Assessment - 12/25/20 0935     Subjective  Denies pain    Limitations Aphasia (expressive and receptive), Absent sensation on R hemi    Patient Stated Goals "to be able to feel myself, do for myself, drive"    Currently in Pain? No/denies                Treatment: simulated eating task,using RUE min difficulty, v.c foam grip.  Tracing activity with RUE, min difficulty/ v.c Completing mod complex puzzle for visual scanning, organization. Pt required mod v.c to use picture as a guide for puzzle and to incorporate RUE. Pt was easily frustrated.  Constant therapy: alternating symbols  level 3, 94% accuracy. Placing coins in bank with RUE, min difficulty due to lack of sensation.                  OT Education - 12/25/20 1309     Education Details  discussed importance of incorporating RUE into light daily activities, attempting to eat with RUE 50% of the time, and attempting to write for ST HW as pt reports her family member wrote all answers for her    Person(s) Educated Patient    Methods Explanation    Comprehension Verbalized understanding              OT Short Term Goals - 12/22/20 1232       OT SHORT TERM GOAL #1   Title Pt will be independent with HEP for RUE coordination and grip strength    Time 4    Period Weeks    Status Achieved   coordination HEP, theraputty red 12/22/20   Target Date 12/22/20      OT SHORT TERM GOAL #2   Title Pt will verbalize understanding of sensory strategies for increased safety awareness    Time 4    Period Weeks    Status On-going   reviewed sensory strategies for safety, still needing cues 12/22/20     OT SHORT TERM GOAL #3   Title Pt will perform environmental scanning in moderately distracting environment with 90% accuracy or greater.    Time 4    Period Weeks    Status  On-going   88% accuracy 12/22/20     OT SHORT TERM GOAL #4   Title Pt will improve box and blocks score by 4 blocks or more with RUE for increase in overall functional use of RUE.    Baseline R 21 L 51    Time 4    Period Weeks    Status Achieved   RUE 30 blocks 12/22/20     OT SHORT TERM GOAL #5   Title Pt will perform simple warm meal prep with supervision and good safety awareness    Time 4    Period Weeks    Status Achieved   supervision 12/22/20              OT Long Term Goals - 11/24/20 1710       OT LONG TERM GOAL #1   Title Pt will be independent with updated HEPs    Time 12    Period Weeks    Status New    Target Date 02/16/21      OT LONG TERM GOAL #2   Title Pt will increase grip strength in RUE by 8 lbs or greater for increasein functional use of RUE, dominant, hand.    Baseline R 24.6, L 42.1    Time 12    Period Weeks    Status New      OT LONG TERM GOAL #3   Title Pt will  complete 9 hole peg test in 2 minutes or less with RUE for increase in fine motor coordination    Baseline R 8 pegs in 2 min, 30 seconds L 24.63s    Time 12    Period Weeks    Status New      OT LONG TERM GOAL #4   Title Pt will complete physical and cognitive task simultaneously with 90% accuracy or greater    Time 12    Period Weeks    Status New      OT LONG TERM GOAL #5   Title Pt will demonstrate or report increased ease with styling her own hair with bilateral UE.    Time 12    Period Weeks    Status New      Long Term Additional Goals   Additional Long Term Goals Yes      OT LONG TERM GOAL #6   Title Pt and caregiver will verbalize undersatnding for return to driving and return to work recommendations.    Time 12    Period Weeks    Status New      OT LONG TERM GOAL #7   Title Pt will score a 57 or greater on FOTO at discharge.    Baseline 38    Time 12    Period Weeks    Status New                   Plan - 12/25/20 0951     Clinical Impression Statement Pt demonstrates progress towards goals, yet she requires encouragement to use her RUE for functional tasks such as eating. Pt demonstrates flat/ depressed affect. Therapist mentioned couseling / neuropsych to pt, she said she is not seeing them and is doing better.    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 ADL;Decreased knowledge of use of  DME;Strength;Dexterity;GMC;Proprioception;UE functional use;ROM;IADL;Vision;Decreased knowledge of precautions;FMC;Sensation;Coordination    Cognitive Skills Problem Solve;Understand;Perception;Thought;Sequencing    Rehab Potential Good    Clinical Decision Making Limited treatment options, no task modification necessary    Comorbidities  Affecting Occupational Performance: None    Modification or Assistance to Complete Evaluation  No modification of tasks or assist necessary to complete eval    OT Frequency 2x / week    OT Duration 12 weeks    OT Treatment/Interventions Self-care/ADL training;Fluidtherapy;DME and/or AE instruction;Therapeutic activities;Cognitive remediation/compensation;Visual/perceptual remediation/compensation;Patient/family education;Passive range of motion;Manual Therapy;Energy conservation;Electrical Stimulation;Neuromuscular education;Building services engineer;Therapeutic exercise    Plan environmental scanning,  continue to work towards goals    Consulted and Agree with Plan of Care Patient    Family Member Consulted mom, Thelma             Patient will benefit from skilled therapeutic intervention in order to improve the following deficits and impairments:   Body Structure / Function / Physical Skills: ADL, Decreased knowledge of use of DME, Strength, Dexterity, GMC, Proprioception, UE functional use, ROM, IADL, Vision, Decreased knowledge of precautions, FMC, Sensation, Coordination Cognitive Skills: Problem Solve, Understand, Perception, Thought, Sequencing     Visit Diagnosis: 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)  Other lack of coordination  Muscle weakness (generalized)  Unsteadiness on feet  Other disturbances of skin sensation  Attention and concentration deficit  Frontal lobe and executive function deficit    Problem List Patient Active Problem List   Diagnosis Date Noted   Acute blood loss anemia 11/10/2020   Abnormal LFTs 11/10/2020   Aphasia 10/30/2020   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 [place], [place], [address] Phone: [phone]   Fax:  [phone]  Name: VEEDA VIRGO MRN: [id number] Date of Birth: June 18, 1996

## 2020-12-25 NOTE — Therapy (Signed)
[facility] [address] [place], [place], [address] Phone: [phone]   Fax:  [phone]  Physical Therapy Treatment  Patient Details  Name: Jane Martin MRN: [id number] Date of Birth: [birth year] Referring Provider (PT): Marlowe Shores, PA-C (to see Raulkar)   Encounter Date: 12/25/2020   PT End of Session - 12/25/20 0854     Visit Number 5    Number of Visits 18    Authorization Type [employer]MR/[employer]    PT Start Time 0852    PT Stop Time 0930    PT Time Calculation (min) 38 min    Activity Tolerance Patient tolerated treatment well    Behavior During Therapy [facility] for tasks assessed/performed             Past Medical History:  Diagnosis Date   Stroke ([place]) 10/07/2020    Past Surgical History:  Procedure Laterality Date   CRANIOTOMY Left 10/17/2020   Procedure: CRANIOTOMY HEMATOMA Evacuation;  Surgeon: Kristeen Miss, MD;  Location: [facility];  Service: Neurosurgery;  Laterality: Left;   IR ANGIO INTRA EXTRACRAN SEL COM CAROTID INNOMINATE BILAT MOD SED  10/29/2020   IR ANGIO VERTEBRAL SEL VERTEBRAL BILAT MOD SED  10/29/2020   IR US GUIDE VASC ACCESS RIGHT  10/29/2020    Vitals:   12/25/20 0917  BP: 110/77  Pulse: 87     Subjective Assessment - 12/25/20 0854     Subjective No falls, nothing new.  Just still numbness.    Pertinent History No significant PMH    Patient Stated Goals Pt's goal for therapy are for RLE to stop being numb and get back to normal.    Currently in Pain? No/denies                [facility] PT Assessment - 12/25/20 0001       6 Minute Walk- Baseline   6 Minute Walk- Baseline yes    BP (mmHg) 99/70    HR (bpm) 85    02 Sat (%RA) 98 %      6 Minute walk- Post Test   6 Minute Walk Post Test yes    BP (mmHg) 127/105    HR (bpm) 95    02 Sat (%RA) 97 %    Modified Borg Scale for Dyspnea 0- Nothing at all    Perceived Rate of Exertion (Borg) 9- very light      6 minute walk  test results    Aerobic Endurance Distance Walked 1684    Endurance additional comments Normal 1765 (32-69 yo)                Access Code: [email or web address] URL: [email or web address] Date: 12/25/2020 Prepared by: Mady Haagensen  Exercises-Reviewed updates to HEP given last visit.  Pt needs min cues for correct performance of several exercises.  Standing Forward Step Taps with Counter Support - 1 x daily - 5 x weekly - 2-3 sets - 10 reps Standard Lunge - 1 x daily - 7 x weekly - 2 sets - 10 reps Side Stepping with Resistance at Ankles - 1 x daily - 7 x weekly - 3 sets - 10 reps Forward and Backward Monster Walk with Resistance at Ankles and Counter Support - 1 x daily - 7 x weekly - 3 sets - 10 reps Standing Anti-Rotation Press with Anchored Resistance - 1 x daily - 7 x weekly - 3 sets - 10 reps  [facility] Adult PT Treatment/Exercise - 12/25/20 0001       Transfers   Transfers Sit to Stand;Stand to Sit    Sit to Stand 6: Modified independent (Device/Increase time);Without upper extremity assist;From chair/3-in-1    Five time sit to stand comments  17.69   14.09 sec   Stand to Sit 6: Modified independent (Device/Increase time);Without upper extremity assist;To chair/3-in-1      Ambulation/Gait   Ambulation/Gait Yes    Ambulation Distance (Feet) 1750 Feet   total; see 6 MWT distance   Assistive device None    Gait Pattern Step-through pattern    Ambulation Surface Level;Indoor    Gait Comments Rechecked BP after several minutes of rest after gait, with BP measures more WNL to proceed with activity.      High Level Balance   High Level Balance Comments SLS:  RLE 10 sec, 3 reps, LLE 10 sec, 3 reps      Therapeutic Activites    Therapeutic Activities Other Therapeutic Activities    Other Therapeutic Activities Discussed fall prevention education in home environment.  Discussed progress towards goals, POC.                    PT Education -  12/25/20 1205     Education Details Fall prevention, progress towards goals    Person(s) Educated Patient    Methods Explanation    Comprehension Verbalized understanding              PT Short Term Goals - 12/25/20 1202       PT SHORT TERM GOAL #1   Title Pt will be independent with HEP for improved strength, balance, and gait.  TARGET 12/26/2020    Baseline Performing, but needs min reminder cues    Time 5    Period Weeks    Status Partially Met      PT SHORT TERM GOAL #2   Title Pt will perform 5x sit<>stand in less than or equal to 14 sec for improved transfer efficiency and functional strength.    Baseline 17.09> 14.09 sec 12/25/20    Time 5    Period Weeks    Status Achieved      PT SHORT TERM GOAL #3   Title Pt will improve single limb stance on RLE to at least 10 sec for improved balance, functional strength.    Baseline 3.5 sec; 10 sec, 3 reps 12/25/20    Time 5    Period Weeks    Status Achieved      PT SHORT TERM GOAL #4   Title Pt/family will verbalize understanding of fall prevention in home environment.    Time 5    Period Weeks    Status Achieved      PT SHORT TERM GOAL #5   Title 6 minute walk test to improve to at least 1700 ft for improved gait efficiency over longer distances.    Baseline 1589 ft> 1684 ft 12/25/20    Time 5    Period Weeks    Status Not Met               PT Long Term Goals - 11/24/20 1518       PT LONG TERM GOAL #1   Title Pt will be independent with progression of HEP for improved strength, balance, and gait.  TARGET 01/23/2021    Time 9    Period Weeks    Status New      PT  LONG TERM GOAL #2   Title Pt will improve 5x sit<>stand to less than or equal to 12 sec to demonstrate improved functional strength and transfer efficiency.    Time 9    Period Weeks    Status New      PT LONG TERM GOAL #3   Title Pt will improve FGA score to at least 24/30 to decrease fall risk.    Baseline 19/30    Time 9    Period Weeks     Status New      PT LONG TERM GOAL #4   Title Pt will improve gait velocity to at least 4 ft/sec for improved gait efficiency and safety.    Baseline 3.28 ft/sec (pt reports always being a fast walker)    Time 9    Period Weeks    Status New      PT LONG TERM GOAL #5   Title Pt will ambulate at least 1000 ft, indoor and outdoor surfaces, no LOB, independently, for improved community gait.    Time 9    Period Weeks    Status New      Additional Long Term Goals   Additional Long Term Goals Yes      PT LONG TERM GOAL #6   Title Pt will improve FOTO to at least 81% (predicted FOTO at d/c) to demo improved overall functional mobility.    Baseline 68%    Time 9    Period Weeks    Status New                   Plan - 12/25/20 1205     Clinical Impression Statement Assessed STGs this visit, with pt meeting 3 of 5 STGs.  STG 1 partially  met for HEP; STG 2-4 met for improved functional strength, SLS balance, fall prevention; STG 5 not yet met.  Pt has progressed with 6MWT score by nearly 100 ft, just not to goal.  Pt has only been seen for 4 visits since evaluation, due to several cancellations because treating PT out for illness.  Pt continues to make progress with functional mobility, strength, balance, but will continue to benefit from skilled PT for further work on NMR and strength to RLE.    Personal Factors and Comorbidities Other   No pertient PMH; severity of deficits, including aphasia   Examination-Activity Limitations Locomotion Level;Transfers;Stairs    Examination-Participation Restrictions Occupation;Community Activity;Driving    Stability/Clinical Decision Making Evolving/Moderate complexity    Rehab Potential Good    PT Frequency 2x / week   2x/wk x 8 weeks   PT Duration Other (comment)   plus 1x/wk on eval week; 9 week POC   PT Treatment/Interventions ADLs/Self Care Home Management;Gait training;Stair training;Functional mobility training;Therapeutic  activities;Therapeutic exercise;Balance training;Neuromuscular re-education;Electrical Stimulation;Patient/family education;Manual techniques;Passive range of motion    PT Next Visit Plan Continue to progress HEP-theraband/weights for RLE strengthening; core stability, balance on compliant surfaces vision removed.  Tall kneeling activities for RLE NMR.    PT Home Exercise Plan Access Code: [email or web address]    Consulted and Agree with Plan of Care Patient             Patient will benefit from skilled therapeutic intervention in order to improve the following deficits and impairments:  Abnormal gait, Difficulty walking, Decreased balance, Impaired sensation, Decreased strength, Decreased mobility  Visit Diagnosis: Unsteadiness on feet  Muscle weakness (generalized)  Other abnormalities of gait and mobility     Problem List  Patient Active Problem List   Diagnosis Date Noted   Acute blood loss anemia 11/10/2020   Abnormal LFTs 11/10/2020   Aphasia 10/30/2020   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 12/25/2020, 12:09 PM Frazier Butt., PT   [facility] [address] [place], [place], [address] Phone: [phone]   Fax:  [phone]  Name: Jane Martin MRN: [id number] Date of Birth: [birth year]

## 2020-12-25 NOTE — Patient Instructions (Addendum)
  Read aloud for 5-10 minutes, twice a day. Consider reading aloud with someone there to catch any errors.    Practice reading anything you see aloud (menu, shampoo bottle, food boxes)  If you read on your phone, make sure font is bigger to slow you down/point to each word to help with accuracy

## 2020-12-29 ENCOUNTER — Ambulatory Visit: Payer: PRIVATE HEALTH INSURANCE | Admitting: Physical Therapy

## 2020-12-29 ENCOUNTER — Ambulatory Visit: Payer: PRIVATE HEALTH INSURANCE | Admitting: Occupational Therapy

## 2020-12-29 ENCOUNTER — Ambulatory Visit: Payer: PRIVATE HEALTH INSURANCE | Admitting: Speech Pathology

## 2020-12-29 ENCOUNTER — Other Ambulatory Visit (HOSPITAL_COMMUNITY): Payer: Self-pay | Admitting: Interventional Radiology

## 2020-12-29 ENCOUNTER — Encounter: Payer: Self-pay | Admitting: Speech Pathology

## 2020-12-29 ENCOUNTER — Other Ambulatory Visit: Payer: Self-pay

## 2020-12-29 DIAGNOSIS — R4701 Aphasia: Secondary | ICD-10-CM

## 2020-12-29 DIAGNOSIS — M6281 Muscle weakness (generalized): Secondary | ICD-10-CM

## 2020-12-29 DIAGNOSIS — I69390 Apraxia following cerebral infarction: Secondary | ICD-10-CM | POA: Diagnosis not present

## 2020-12-29 DIAGNOSIS — R41840 Attention and concentration deficit: Secondary | ICD-10-CM

## 2020-12-29 DIAGNOSIS — I612 Nontraumatic intracerebral hemorrhage in hemisphere, unspecified: Secondary | ICD-10-CM | POA: Diagnosis not present

## 2020-12-29 DIAGNOSIS — I771 Stricture of artery: Secondary | ICD-10-CM

## 2020-12-29 DIAGNOSIS — R208 Other disturbances of skin sensation: Secondary | ICD-10-CM

## 2020-12-29 DIAGNOSIS — R41844 Frontal lobe and executive function deficit: Secondary | ICD-10-CM

## 2020-12-29 DIAGNOSIS — R278 Other lack of coordination: Secondary | ICD-10-CM

## 2020-12-29 DIAGNOSIS — I69151 Hemiplegia and hemiparesis following nontraumatic intracerebral hemorrhage affecting right dominant side: Secondary | ICD-10-CM

## 2020-12-29 NOTE — Therapy (Signed)
[facility] [address] [place], [place], [address] Phone: [phone]   Fax:  [phone]  Occupational Therapy Treatment  Patient Details  Name: Jane Martin MRN: [id number] Date of Birth: 10-13-1995 Referring Provider (OT): f/u w Ihor Austin, NP   Encounter Date: 12/29/2020   OT End of Session - 12/29/20 0804     Visit Number 9    Number of Visits 25    Date for OT Re-Evaluation 02/16/21    Authorization Type [facility]    OT Start Time 0803    OT Stop Time 0843    OT Time Calculation (min) 40 min             Past Medical History:  Diagnosis Date   Stroke (HCC) 10/07/2020    Past Surgical History:  Procedure Laterality Date   CRANIOTOMY Left 10/17/2020   Procedure: CRANIOTOMY HEMATOMA Evacuation;  Surgeon: Barnett Abu, MD;  Location: [facility] OR;  Service: Neurosurgery;  Laterality: Left;   IR ANGIO INTRA EXTRACRAN SEL COM CAROTID INNOMINATE BILAT MOD SED  10/29/2020   IR ANGIO VERTEBRAL SEL VERTEBRAL BILAT MOD SED  10/29/2020   IR US GUIDE VASC ACCESS RIGHT  10/29/2020    There were no vitals filed for this visit.   Pt denies pain  Treatment:Constant therapy for alternating attention 95% alternating symbols, level 5  Red theraband exercises for RUE: biceps curls, triceps extension,  shoulder abduction- low range using mirror for improved postioning,  then low range shoulder flexion and shoulder extension, pt required min-mod v.c for last 2 exercises due to sensory deficits, pt will need reinforcement before issuing. Environmental scanning to locate items in sequential order, 2/10 missed on first pass, pt moved very quickly and impulsively, requiring v.c to slow down to avoid bumping into people, discussed how this would impact safety for driving and importance of slowing down. Gripper set at level 1 to pick up blocks for sustained grip using vision to compensate, min-mod drops,  Stacking blocks with  bilateral UE's simultaneously. Placing grooved pegs into pegboard with RUE for increased coordination, min difficulty/ v.c                       OT Education - 12/29/20 0838     Education Details discussion regarding safety for driving, pt reports neurologist cleared her for driving. Pt reports driving with a family member this weekend and she states it went well.Therapist recommends that pt only drives with someone initally and does not drive on highway. Pt was cautioned due to sensory deficits on right side, including RLE and how it may impact ability to brake quickly.    Person(s) Educated Patient    Methods Explanation, Verbal cues              OT Short Term Goals - 12/29/20 0800       OT SHORT TERM GOAL #1   Title Pt will be independent with HEP for RUE coordination and grip strength    Time 4    Period Weeks    Status Achieved   coordination HEP, theraputty red 12/22/20   Target Date 12/22/20      OT SHORT TERM GOAL #2   Title Pt will verbalize understanding of sensory strategies for increased safety awareness    Time 4    Period Weeks    Status On-going   reviewed sensory strategies for safety, still needing cues 12/22/20     OT  SHORT TERM GOAL #3   Title Pt will perform environmental scanning in moderately distracting environment with 90% accuracy or greater.    Time 4    Period Weeks    Status On-going   88% accuracy 12/22/20     OT SHORT TERM GOAL #4   Title Pt will improve box and blocks score by 4 blocks or more with RUE for increase in overall functional use of RUE.    Baseline R 21 L 51    Time 4    Period Weeks    Status Achieved   RUE 30 blocks 12/22/20     OT SHORT TERM GOAL #5   Title Pt will perform simple warm meal prep with supervision and good safety awareness    Time 4    Period Weeks    Status Achieved   supervision 12/22/20              OT Long Term Goals - 11/24/20 1710       OT LONG TERM GOAL #1   Title Pt will be  independent with updated HEPs    Time 12    Period Weeks    Status New    Target Date 02/16/21      OT LONG TERM GOAL #2   Title Pt will increase grip strength in RUE by 8 lbs or greater for increasein functional use of RUE, dominant, hand.    Baseline R 24.6, L 42.1    Time 12    Period Weeks    Status New      OT LONG TERM GOAL #3   Title Pt will complete 9 hole peg test in 2 minutes or less with RUE for increase in fine motor coordination    Baseline R 8 pegs in 2 min, 30 seconds L 24.63s    Time 12    Period Weeks    Status New      OT LONG TERM GOAL #4   Title Pt will complete physical and cognitive task simultaneously with 90% accuracy or greater    Time 12    Period Weeks    Status New      OT LONG TERM GOAL #5   Title Pt will demonstrate or report increased ease with styling her own hair with bilateral UE.    Time 12    Period Weeks    Status New      Long Term Additional Goals   Additional Long Term Goals Yes      OT LONG TERM GOAL #6   Title Pt and caregiver will verbalize undersatnding for return to driving and return to work recommendations.    Time 12    Period Weeks    Status New      OT LONG TERM GOAL #7   Title Pt will score a 57 or greater on FOTO at discharge.    Baseline 38    Time 12    Period Weeks    Status New                   Plan - 12/29/20 1201     Clinical Impression Statement Pt demonstrates brighter affect today. Pt reports she was cleared by MD for driving. Therapist cationed pt due to sensory deficits and recommends she drives with someone initally.    OT Occupational Profile and History Problem Focused Assessment - Including review of records relating to presenting problem    Occupational performance deficits (Please  refer to evaluation for details): IADL's;ADL's;Leisure;Work    Games developer / Function / Physical Skills ADL;Decreased knowledge of use of DME;Strength;Dexterity;GMC;Proprioception;UE functional  use;ROM;IADL;Vision;Decreased knowledge of precautions;FMC;Sensation;Coordination    Cognitive Skills Problem Solve;Understand;Perception;Thought;Sequencing    Rehab Potential Good    Clinical Decision Making Limited treatment options, no task modification necessary    Comorbidities Affecting Occupational Performance: None    Modification or Assistance to Complete Evaluation  No modification of tasks or assist necessary to complete eval    OT Frequency 2x / week    OT Duration 12 weeks    OT Treatment/Interventions Self-care/ADL training;Fluidtherapy;DME and/or AE instruction;Therapeutic activities;Cognitive remediation/compensation;Visual/perceptual remediation/compensation;Patient/family education;Passive range of motion;Manual Therapy;Energy conservation;Electrical Stimulation;Neuromuscular education;Building services engineer;Therapeutic exercise    Plan review theraband exercises, if pt demonstrates good contol then issue with red band, reinforce safety for driving    Consulted and Agree with Plan of Care Patient             Patient will benefit from skilled therapeutic intervention in order to improve the following deficits and impairments:   Body Structure / Function / Physical Skills: ADL, Decreased knowledge of use of DME, Strength, Dexterity, GMC, Proprioception, UE functional use, ROM, IADL, Vision, Decreased knowledge of precautions, FMC, Sensation, Coordination Cognitive Skills: Problem Solve, Understand, Perception, Thought, Sequencing     Visit Diagnosis: Muscle weakness (generalized)  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 [place], [place], [address] Phone: [phone]   Fax:  [phone]  Name: Jane Martin MRN: [id number] Date of Birth: 07-09-1995

## 2020-12-29 NOTE — Therapy (Signed)
[facility] [address] [place], [place], [address] Phone: [phone]   Fax:  [phone]  Speech Language Pathology Treatment  Patient Details  Name: Jane Martin MRN: [id number] Date of Birth: 1996-03-27 Referring Provider (SLP): Dr. Sula Soda   Encounter Date: 12/29/2020   End of Session - 12/29/20 0938     Visit Number 9    Number of Visits 25    Date for SLP Re-Evaluation 02/16/21    Authorization Type no auth; 130 VL PT/OT/ST combined    SLP Start Time 0847    SLP Stop Time  0931    SLP Time Calculation (min) 44 min    Activity Tolerance Patient tolerated treatment well             Past Medical History:  Diagnosis Date   Stroke (HCC) 10/07/2020    Past Surgical History:  Procedure Laterality Date   CRANIOTOMY Left 10/17/2020   Procedure: CRANIOTOMY HEMATOMA Evacuation;  Surgeon: Barnett Abu, MD;  Location: [facility];  Service: Neurosurgery;  Laterality: Left;   IR ANGIO INTRA EXTRACRAN SEL COM CAROTID INNOMINATE BILAT MOD SED  10/29/2020   IR ANGIO VERTEBRAL SEL VERTEBRAL BILAT MOD SED  10/29/2020   IR US GUIDE VASC ACCESS RIGHT  10/29/2020    There were no vitals filed for this visit.   Subjective Assessment - 12/29/20 0847     Subjective "Reading"    Currently in Pain? No/denies                   ADULT SLP TREATMENT - 12/29/20 0848       General Information   Behavior/Cognition Alert;Cooperative;Pleasant mood      Treatment Provided   Treatment provided Cognitive-Linquistic      Cognitive-Linquistic Treatment   Treatment focused on Aphasia;Patient/family/caregiver education    Skilled Treatment Rosielee reports reading a loud on instagram. We downloaded the [employer] app on her phone. Reading comprehension and awareness of errors in reading aloud targeted with app. She ID'd 80% of errors, however she reqiured phonemic cues or repepetition to self correct frequently.  Targeted strategy of quiety saying all days, months or numbers (1-10) until she gets the correct word rather than trying to guess or sound out incorrectly. Written expression targeted typing sentences with 2 given words with extended time. She ID'd 80% accuracy, requiring usual mod A tyo correct. .      Assessment / Recommendations / Plan   Plan Continue with current plan of care      Progression Toward Goals   Progression toward goals Progressing toward goals              SLP Education - 12/29/20 0933     Education Details Talk path news, typing sentences, increase  font, strategies to say days, months, numbers more accurately    Person(s) Educated Patient    Methods Explanation;Demonstration;Verbal cues;Handout    Comprehension Verbalized understanding;Returned demonstration;Verbal cues required;Need further instruction              SLP Short Term Goals - 12/29/20 0936       SLP SHORT TERM GOAL #1   Title Pt will name 10 items in persoanlly relevant category with rare min A over 2 sessions    Baseline 12-22-20    Time 1    Period Weeks    Status On-going      SLP SHORT TERM GOAL #2   Title Pt will utilize 2  compensatory strategies for aphasia in structured language tasks 8/10 opportunities with occasional min A over 2 sessions    Baseline 12-22-20; 12/29/20    Time 1    Period Weeks    Status On-going      SLP SHORT TERM GOAL #3   Title Pt will comprehend 3 sentence paragraph with rare min A over 2 sessions    Baseline 12-25-20, 12/29/20    Time 1    Period Weeks    Status On-going      SLP SHORT TERM GOAL #4   Title Pt will write at sentence level, IDing errors with occasional min 8/10 errors    Baseline 12-15-20    Time 1    Period Weeks    Status On-going      SLP SHORT TERM GOAL #5   Title Pt will carryover compensations for aphasia during 12 minute simple conversation as needed with occasional min A    Time 1    Period Weeks    Status On-going               SLP Long Term Goals - 12/29/20 2671       SLP LONG TERM GOAL #1   Title Pt will complete complex naming tasks with 90% accuracy and occasional min A over 2 sessions    Time 7    Period Weeks    Status On-going      SLP LONG TERM GOAL #2   Title Pt will comprehend 3 paragraph passages of interest to her, restating 2 salient details from each paragraph with occasional min A    Time 7    Period Weeks    Status On-going      SLP LONG TERM GOAL #3   Title Pt will ID and correct errors on texts, e mails and other 4 sentence written communications 9/10 with occasional min A    Time 7    Period Weeks    Status On-going      SLP LONG TERM GOAL #4   Title Pt will carryover compensations for aphasia as needed over 20 minute moderately complex conversation with occasional min A over 2 sessions    Time 7    Period Weeks    Status On-going      SLP LONG TERM GOAL #5   Title Pt will improve score on The Communicative Participation Item Bank by 4 points    Time 7    Period Weeks    Status On-going              Plan - 12/29/20 0934     Clinical Impression Statement Veleria Barnhardt is referred to outpt ST s/p Left ICH due to aphasia. Pt indicated increased difficulty naming while reading aloud. SLP targeted paragraph level tasks. Inconsistent awareness of errors (semantic and phonemic) noted, which improved with min verbal promting and occasional phonemic cues. At this time, Sharetta can not return to her previous job due to aphasia. I recommend skilled ST to maximize communication for safety, effective and efficient participation in conversations and for possible return to the workforce.    Speech Therapy Frequency 2x / week    Duration 12 weeks   25 visits   Treatment/Interventions SLP instruction and feedback;Environmental controls;Language facilitation;Cueing hierarchy;Compensatory strategies;Functional tasks;Cognitive reorganization;Compensatory techniques;Patient/family  education;Multimodal communcation approach;Internal/external aids    Potential to Achieve Goals Good             Patient will benefit from skilled therapeutic intervention in  order to improve the following deficits and impairments:   Aphasia    Problem List Patient Active Problem List   Diagnosis Date Noted   Acute blood loss anemia 11/10/2020   Abnormal LFTs 11/10/2020   Aphasia 10/30/2020   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, [place], [address] Phone: [phone]   Fax:  [phone]   Name: DANEL STUDZINSKI MRN: [id number] Date of Birth: 03/19/1996

## 2020-12-29 NOTE — Therapy (Signed)
[facility] [address] [place], [place], [address] Phone: [phone]   Fax:  [phone]  Physical Therapy Treatment  Patient Details  Name: Jane Martin MRN: [id number] Date of Birth: 1996-07-04 Referring Provider (PT): Marlowe Shores, PA-C (to see Raulkar)   Encounter Date: 12/29/2020   PT End of Session - 12/29/20 0720     Visit Number 6    Number of Visits 18    Authorization Type [employer]    PT Start Time 0718    PT Stop Time 0759    PT Time Calculation (min) 41 min    Activity Tolerance Patient tolerated treatment well    Behavior During Therapy [facility] for tasks assessed/performed             Past Medical History:  Diagnosis Date   Stroke ([place]) 10/07/2020    Past Surgical History:  Procedure Laterality Date   CRANIOTOMY Left 10/17/2020   Procedure: CRANIOTOMY HEMATOMA Evacuation;  Surgeon: Kristeen Miss, MD;  Location: [facility];  Service: Neurosurgery;  Laterality: Left;   IR ANGIO INTRA EXTRACRAN SEL COM CAROTID INNOMINATE BILAT MOD SED  10/29/2020   IR ANGIO VERTEBRAL SEL VERTEBRAL BILAT MOD SED  10/29/2020   IR US GUIDE VASC ACCESS RIGHT  10/29/2020    There were no vitals filed for this visit.   Subjective Assessment - 12/29/20 0719     Subjective No changes, didn't do too much over the weekend.    Pertinent History No significant PMH    Patient Stated Goals Pt's goal for therapy are for RLE to stop being numb and get back to normal.    Currently in Pain? No/denies                               [facility] Adult PT Treatment/Exercise - 12/29/20 0720       Transfers   Transfers Sit to Stand;Stand to Sit    Sit to Stand 6: Modified independent (Device/Increase time);Without upper extremity assist;From chair/3-in-1    Stand to Sit 6: Modified independent (Device/Increase time);Without upper extremity assist;To chair/3-in-1    Number of Reps 10 reps;2 sets    Comments LLE on  balance disk for increased weightbearing through RLE      Neuro Re-ed    Neuro Re-ed Details  Tall kneeling position:  tall kneel to sit on heels x 10 reps; tall kneel hip abduction out to side, 10 reps each side, then sidestep together x 10 R and L, Sidestep along mat R and L, 3 reps, 8-10 ft.  Tall kneel>1/2 kneel x 10 reps each side, cues for abdominal activation for steady trunk.      Knee/Hip Exercises: Stretches   Pension scheme manager reps;20 seconds    Sports administrator Limitations PT assist in standing to hold quad in stretch position    Gastroc Stretch Right;3 reps;20 seconds    Gastroc Stretch Limitations Standing, foot propped on block      Knee/Hip Exercises: Aerobic   Stepper Elliptical level 2; forward x2 min, backward x 2 min.  Verbal and tactile Cues provided to prevent R knee recurvatum      Knee/Hip Exercises: Machines for Strengthening   Total Gym Leg Press Double leg 100# 2x10; 50# single leg 2x10.  Cues to look at leg to avoid R knee recurvatum and improve control.      Knee/Hip Exercises: Standing   Lateral Step Up Right;1  set;10 reps;Hand Hold: 0;Step Height: 6"    Lateral Step Up Limitations RLE 2# weight    Forward Step Up Both;2 sets;10 reps;Hand Hold: 0;Step Height: 6"    Forward Step Up Limitations RLE 2# weight; step up/up, down/down, then single limb step up    Step Down Both;1 set;10 reps;Hand Hold: 0;Step Height: 6"    Step Down Limitations RLE 2# weight    Functional Squat 2 sets;10 reps;Limitations    Functional Squat Limitations LLE on blaance disk for increased RLE weightbearing    Other Standing Knee Exercises RLE as stance with LLE step taps to 12" step, then LLE back into runner's stretch position, working on RLE stance leg control    Other Standing Knee Exercises WAve squats:  squats to up on toes, 10 reps                      PT Short Term Goals - 12/25/20 1202       PT SHORT TERM GOAL #1   Title Pt will be independent with HEP for  improved strength, balance, and gait.  TARGET 12/26/2020    Baseline Performing, but needs min reminder cues    Time 5    Period Weeks    Status Partially Met      PT SHORT TERM GOAL #2   Title Pt will perform 5x sit<>stand in less than or equal to 14 sec for improved transfer efficiency and functional strength.    Baseline 17.09> 14.09 sec 12/25/20    Time 5    Period Weeks    Status Achieved      PT SHORT TERM GOAL #3   Title Pt will improve single limb stance on RLE to at least 10 sec for improved balance, functional strength.    Baseline 3.5 sec; 10 sec, 3 reps 12/25/20    Time 5    Period Weeks    Status Achieved      PT SHORT TERM GOAL #4   Title Pt/family will verbalize understanding of fall prevention in home environment.    Time 5    Period Weeks    Status Achieved      PT SHORT TERM GOAL #5   Title 6 minute walk test to improve to at least 1700 ft for improved gait efficiency over longer distances.    Baseline 1589 ft> 1684 ft 12/25/20    Time 5    Period Weeks    Status Not Met               PT Long Term Goals - 11/24/20 1518       PT LONG TERM GOAL #1   Title Pt will be independent with progression of HEP for improved strength, balance, and gait.  TARGET 01/23/2021    Time 9    Period Weeks    Status New      PT LONG TERM GOAL #2   Title Pt will improve 5x sit<>stand to less than or equal to 12 sec to demonstrate improved functional strength and transfer efficiency.    Time 9    Period Weeks    Status New      PT LONG TERM GOAL #3   Title Pt will improve FGA score to at least 24/30 to decrease fall risk.    Baseline 19/30    Time 9    Period Weeks    Status New      PT LONG TERM GOAL #4  Title Pt will improve gait velocity to at least 4 ft/sec for improved gait efficiency and safety.    Baseline 3.28 ft/sec (pt reports always being a fast walker)    Time 9    Period Weeks    Status New      PT LONG TERM GOAL #5   Title Pt will ambulate at  least 1000 ft, indoor and outdoor surfaces, no LOB, independently, for improved community gait.    Time 9    Period Weeks    Status New      Additional Long Term Goals   Additional Long Term Goals Yes      PT LONG TERM GOAL #6   Title Pt will improve FOTO to at least 81% (predicted FOTO at d/c) to demo improved overall functional mobility.    Baseline 68%    Time 9    Period Weeks    Status New                   Plan - 12/29/20 0755     Clinical Impression Statement Skilled PT session today focused on RLE strengthening through mostly weightbearing and weighted activities.  Cues with elliptical and leg press to visualize leg in mirror or with eyes to avoid R knee recurvatum.  Pt continues to have decreased sensation through RLE, which limits pt's proprioception with gait and functional activities.  Pt will continue to benefit from skilled PT to further address strength and functional mobility towards LTGs.    Personal Factors and Comorbidities Other   No pertient PMH; severity of deficits, including aphasia   Examination-Activity Limitations Locomotion Level;Transfers;Stairs    Examination-Participation Restrictions Occupation;Community Activity;Driving    Stability/Clinical Decision Making Evolving/Moderate complexity    Rehab Potential Good    PT Frequency 2x / week   2x/wk x 8 weeks   PT Duration Other (comment)   plus 1x/wk on eval week; 9 week POC   PT Treatment/Interventions ADLs/Self Care Home Management;Gait training;Stair training;Functional mobility training;Therapeutic activities;Therapeutic exercise;Balance training;Neuromuscular re-education;Electrical Stimulation;Patient/family education;Manual techniques;Passive range of motion    PT Next Visit Plan Continue to progress HEP-theraband/weights for RLE strengthening; core stability, balance on compliant surfaces vision removed.  Tall kneeling activities for RLE NMR.  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 12/29/2020, 8:00 AM Frazier Butt., PT  [facility] [address] [place], [place], [address] Phone: [phone]   Fax:  [phone]  Name: Jane Martin MRN: [id number] Date of Birth: Nov 09, 1995

## 2020-12-29 NOTE — Patient Instructions (Addendum)
  Sign into [employer] App and practice reading articles aloud. You will have to hit pause to stop the app from reading for you  If you need to hear the word, you can hit play or you can have it read to you first  Next session, have short  oral summaries (1-2 sentences) of the articles you have read  Science  and History topics may be more challenging   Proper nouns and low frequency words (words we don't use often) may be harder for you  - that's OK  When you encounter difficulty saying months, start at January and quietly name all of the months until you get to the right one  Same with days of the week, start at Sunday and say them all

## 2020-12-31 ENCOUNTER — Ambulatory Visit: Payer: PRIVATE HEALTH INSURANCE

## 2020-12-31 ENCOUNTER — Telehealth: Payer: Self-pay | Admitting: *Deleted

## 2020-12-31 ENCOUNTER — Encounter: Payer: Self-pay | Admitting: Occupational Therapy

## 2020-12-31 ENCOUNTER — Other Ambulatory Visit: Payer: Self-pay

## 2020-12-31 ENCOUNTER — Ambulatory Visit: Payer: PRIVATE HEALTH INSURANCE | Admitting: Physical Therapy

## 2020-12-31 ENCOUNTER — Encounter: Payer: Self-pay | Admitting: Physical Therapy

## 2020-12-31 ENCOUNTER — Ambulatory Visit: Payer: PRIVATE HEALTH INSURANCE | Admitting: Occupational Therapy

## 2020-12-31 DIAGNOSIS — I69151 Hemiplegia and hemiparesis following nontraumatic intracerebral hemorrhage affecting right dominant side: Secondary | ICD-10-CM

## 2020-12-31 DIAGNOSIS — I612 Nontraumatic intracerebral hemorrhage in hemisphere, unspecified: Secondary | ICD-10-CM | POA: Diagnosis not present

## 2020-12-31 DIAGNOSIS — M6281 Muscle weakness (generalized): Secondary | ICD-10-CM

## 2020-12-31 DIAGNOSIS — R2681 Unsteadiness on feet: Secondary | ICD-10-CM

## 2020-12-31 DIAGNOSIS — R2689 Other abnormalities of gait and mobility: Secondary | ICD-10-CM

## 2020-12-31 DIAGNOSIS — R41840 Attention and concentration deficit: Secondary | ICD-10-CM

## 2020-12-31 DIAGNOSIS — R278 Other lack of coordination: Secondary | ICD-10-CM

## 2020-12-31 DIAGNOSIS — R208 Other disturbances of skin sensation: Secondary | ICD-10-CM

## 2020-12-31 DIAGNOSIS — R41844 Frontal lobe and executive function deficit: Secondary | ICD-10-CM

## 2020-12-31 DIAGNOSIS — I69390 Apraxia following cerebral infarction: Secondary | ICD-10-CM | POA: Diagnosis not present

## 2020-12-31 DIAGNOSIS — R4701 Aphasia: Secondary | ICD-10-CM

## 2020-12-31 NOTE — Therapy (Signed)
[facility] [address] [place], [place], [address] Phone: [phone]   Fax:  [phone]  Occupational Therapy Treatment  Patient Details  Name: Jane Martin MRN: [id number] Date of Birth: 10-12-1995 Referring Provider (OT): f/u w Ihor Austin, NP   Encounter Date: 12/31/2020   OT End of Session - 12/31/20 1100     Visit Number 10    Number of Visits 25    Date for OT Re-Evaluation 02/16/21    Authorization Type [facility] [employer]    OT Start Time 1100    OT Stop Time 1145    OT Time Calculation (min) 45 min    Activity Tolerance Patient tolerated treatment well    Behavior During Therapy [facility] for tasks assessed/performed             Past Medical History:  Diagnosis Date   Stroke (HCC) 10/07/2020    Past Surgical History:  Procedure Laterality Date   CRANIOTOMY Left 10/17/2020   Procedure: CRANIOTOMY HEMATOMA Evacuation;  Surgeon: Barnett Abu, MD;  [facility]: [facility] OR;  Service: Neurosurgery;  Laterality: Left;   IR ANGIO INTRA EXTRACRAN SEL COM CAROTID INNOMINATE BILAT MOD SED  10/29/2020   IR ANGIO VERTEBRAL SEL VERTEBRAL BILAT MOD SED  10/29/2020   IR US GUIDE VASC ACCESS RIGHT  10/29/2020    There were no vitals filed for this visit.   Subjective Assessment - 12/31/20 1133     Subjective  Pt denies any pain. PR reports, "still just numb"    Pertinent History None    Limitations Aphasia (expressive and receptive), Absent sensation on R hemi    Patient Stated Goals "to be able to feel myself, do for myself, drive"    Currently in Pain? No/denies            Pt arrived from PT and seated at table for OT session. Concluded session and escorted patient to ST appt.   Myofascial Ball with incorporating more input into RUE on dorsal and volar aspect.  Small Peg Board working on in hand manipulation with use of visual sense for lack of sensation tactile in RUE. PT with mod to max drops and max difficulty.    ADLs pt reported not doing everything herself because she can't feel things with the right side. (I.e. cooking, fixing hair, etc) OT educated on starting to do things more now, at present sensation status, and if sensation starts to come back, than things will get easier, but that the best way to gain more independence is to start doing. Pt verbalized understanding. Reviewed sensory strategies for safety with cooking, etc.    Hand Gripper: with RUE on level 2 with black spring. Pt picked up 1 inch blocks with gripper with min drops and mod difficulty. Pt lost grip of gripper a few times.  Environmental Scanning with 13/15 accuracy = 87% on first pass. Pt required mod/max cues for locating remaining items on second pass.                     OT Short Term Goals - 12/31/20 1142       OT SHORT TERM GOAL #1   Title Pt will be independent with HEP for RUE coordination and grip strength    Time 4    Period Weeks    Status Achieved   coordination HEP, theraputty red 12/22/20   Target Date 12/22/20      OT SHORT TERM GOAL #2  Title Pt will verbalize understanding of sensory strategies for increased safety awareness    Time 4    Period Weeks    Status On-going   reviewed sensory strategies for safety, still needing cues 12/22/20     OT SHORT TERM GOAL #3   Title Pt will perform environmental scanning in moderately distracting environment with 90% accuracy or greater.    Time 4    Period Weeks    Status On-going   88% accuracy 12/22/20, 87% 12/31/20     OT SHORT TERM GOAL #4   Title Pt will improve box and blocks score by 4 blocks or more with RUE for increase in overall functional use of RUE.    Baseline R 21 L 51    Time 4    Period Weeks    Status Achieved   RUE 30 blocks 12/22/20     OT SHORT TERM GOAL #5   Title Pt will perform simple warm meal prep with supervision and good safety awareness    Time 4    Period Weeks    Status Achieved   supervision 12/22/20               OT Long Term Goals - 11/24/20 1710       OT LONG TERM GOAL #1   Title Pt will be independent with updated HEPs    Time 12    Period Weeks    Status New    Target Date 02/16/21      OT LONG TERM GOAL #2   Title Pt will increase grip strength in RUE by 8 lbs or greater for increasein functional use of RUE, dominant, hand.    Baseline R 24.6, L 42.1    Time 12    Period Weeks    Status New      OT LONG TERM GOAL #3   Title Pt will complete 9 hole peg test in 2 minutes or less with RUE for increase in fine motor coordination    Baseline R 8 pegs in 2 min, 30 seconds L 24.63s    Time 12    Period Weeks    Status New      OT LONG TERM GOAL #4   Title Pt will complete physical and cognitive task simultaneously with 90% accuracy or greater    Time 12    Period Weeks    Status New      OT LONG TERM GOAL #5   Title Pt will demonstrate or report increased ease with styling her own hair with bilateral UE.    Time 12    Period Weeks    Status New      Long Term Additional Goals   Additional Long Term Goals Yes      OT LONG TERM GOAL #6   Title Pt and caregiver will verbalize undersatnding for return to driving and return to work recommendations.    Time 12    Period Weeks    Status New      OT LONG TERM GOAL #7   Title Pt will score a 57 or greater on FOTO at discharge.    Baseline 38    Time 12    Period Weeks    Status New                   Plan - 12/31/20 1125     Clinical Impression Statement Pt reports driving only with licensed driver right now. Reviewed  concerns re: sensation deficits in RUE and RLE with driving and managing multiple distractions and stimuli. Pt encouraged to increase participation in daily activities to increase independence with present sensation level to increased overall independence.    OT Occupational Profile and History Problem Focused Assessment - Including review of records relating to presenting problem     Occupational performance deficits (Please refer to evaluation for details): IADL's;ADL's;Leisure;Work    Games developer / Function / Physical Skills ADL;Decreased knowledge of use of DME;Strength;Dexterity;GMC;Proprioception;UE functional use;ROM;IADL;Vision;Decreased knowledge of precautions;FMC;Sensation;Coordination    Cognitive Skills Problem Solve;Understand;Perception;Thought;Sequencing    Rehab Potential Good    Clinical Decision Making Limited treatment options, no task modification necessary    Comorbidities Affecting Occupational Performance: None    Modification or Assistance to Complete Evaluation  No modification of tasks or assist necessary to complete eval    OT Frequency 2x / week    OT Duration 12 weeks    OT Treatment/Interventions Self-care/ADL training;Fluidtherapy;DME and/or AE instruction;Therapeutic activities;Cognitive remediation/compensation;Visual/perceptual remediation/compensation;Patient/family education;Passive range of motion;Manual Therapy;Energy conservation;Electrical Stimulation;Neuromuscular education;Building services engineer;Therapeutic exercise    Plan review theraband exercises, if pt demonstrates good contol then issue with red band    Consulted and Agree with Plan of Care Patient             Patient will benefit from skilled therapeutic intervention in order to improve the following deficits and impairments:   Body Structure / Function / Physical Skills: ADL, Decreased knowledge of use of DME, Strength, Dexterity, GMC, Proprioception, UE functional use, ROM, IADL, Vision, Decreased knowledge of precautions, FMC, Sensation, Coordination Cognitive Skills: Problem Solve, Understand, Perception, Thought, Sequencing     Visit Diagnosis: Muscle weakness (generalized)  Other lack of coordination  Other disturbances of skin sensation  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)  Attention and concentration deficit  Frontal lobe and executive function deficit    Problem List Patient Active Problem List   Diagnosis Date Noted   Acute blood loss anemia 11/10/2020   Abnormal LFTs 11/10/2020   Aphasia 10/30/2020   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

## 2020-12-31 NOTE — Telephone Encounter (Signed)
Received at desk signed metlife and unum forms,given to medical records.

## 2020-12-31 NOTE — Telephone Encounter (Signed)
Pt unum form faxed on 12/31/20

## 2020-12-31 NOTE — Therapy (Signed)
[facility] [address] [place], [place], [address] Phone: [phone]   Fax:  [phone]  Speech Language Pathology Treatment  Patient Details  Name: Jane Martin MRN: [id number] Date of Birth: [birth year] Referring Provider (SLP): Dr. Sula Soda   Encounter Date: 12/31/2020   End of Session - 12/31/20 1120     Visit Number 10    Number of Visits 25    Date for SLP Re-Evaluation 02/16/21    Authorization Type no auth; 130 VL PT/OT/ST combined    SLP Start Time 1145    SLP Stop Time  1230    SLP Time Calculation (min) 45 min    Activity Tolerance Patient tolerated treatment well             Past Medical History:  Diagnosis Date   Stroke (HCC) 10/07/2020    Past Surgical History:  Procedure Laterality Date   CRANIOTOMY Left 10/17/2020   Procedure: CRANIOTOMY HEMATOMA Evacuation;  Surgeon: Barnett Abu, MD;  Location: [facility] OR;  Service: Neurosurgery;  Laterality: Left;   IR ANGIO INTRA EXTRACRAN SEL COM CAROTID INNOMINATE BILAT MOD SED  10/29/2020   IR ANGIO VERTEBRAL SEL VERTEBRAL BILAT MOD SED  10/29/2020   IR US GUIDE VASC ACCESS RIGHT  10/29/2020    There were no vitals filed for this visit.   Subjective Assessment - 12/31/20 1145     Subjective "I've been reading"    Currently in Pain? No/denies                   ADULT SLP TREATMENT - 12/31/20 1118       General Information   Behavior/Cognition Alert;Cooperative;Pleasant mood      Treatment Provided   Treatment provided Cognitive-Linquistic      Cognitive-Linquistic Treatment   Treatment focused on Aphasia;Patient/family/caregiver education    Skilled Treatment Pt reports she has been reading aloud on [employer]. Pt read aloud story, with intermittent errors requiring mod verbal cues x9 (phonemic paraphasias, omitting endings, multisyllabic words). Occasional omissions and substitutions of articles (the, a, of, etc)  noted. Self-corrections of words x10 exhibited while reading. SLP suggested patient read each sentence aloud, have device read aloud, then read it aloud again to aid correction of errors. SLP targeted writing for divergent naming task, in which pt wrote 10 items related to work, with additional processing time needed. Spelling errors for words x1 noted and pt required occasional min verbal cues to correct. Pt wrote 10 corresponding sentences, with occasional mod cues for spelling and rare min A for word finding. Pt endorsed frustration related to writing with dominant hand as it is still numb. OT recommends pt continue to utilize hand.      Assessment / Recommendations / Plan   Plan Continue with current plan of care      Progression Toward Goals   Progression toward goals Progressing toward goals                SLP Short Term Goals - 12/31/20 1120       SLP SHORT TERM GOAL #1   Title Pt will name 10 items in [place] relevant category with rare min A over 2 sessions    Baseline 12-22-20, 12-31-20    Time --    Period --    Status Achieved      SLP SHORT TERM GOAL #2   Title Pt will utilize 2 compensatory strategies for aphasia in structured language tasks 8/10  opportunities with occasional min A over 2 sessions    Baseline 12-22-20; 12/29/20    Status Achieved      SLP SHORT TERM GOAL #3   Title Pt will comprehend 3 sentence paragraph with rare min A over 2 sessions    Baseline 12-25-20, 12/29/20    Status Achieved      SLP SHORT TERM GOAL #4   Title Pt will write at sentence level, IDing errors with occasional min 8/10 errors    Baseline 12-15-20, 12-31-20    Time --    Period --    Status Achieved      SLP SHORT TERM GOAL #5   Title Pt will carryover compensations for aphasia during 12 minute simple conversation as needed with occasional min A    Baseline 12-31-20    Time --    Period --    Status Achieved              SLP Long Term Goals - 12/31/20 1121        SLP LONG TERM GOAL #1   Title Pt will complete complex naming tasks with 90% accuracy and occasional min A over 2 sessions    Time 7    Period Weeks    Status On-going      SLP LONG TERM GOAL #2   Title Pt will comprehend 3 paragraph passages of interest to her, restating 2 salient details from each paragraph with occasional min A    Time 7    Period Weeks    Status On-going      SLP LONG TERM GOAL #3   Title Pt will ID and correct errors on texts, e mails and other 4 sentence written communications 9/10 with occasional min A    Time 7    Period Weeks    Status On-going      SLP LONG TERM GOAL #4   Title Pt will carryover compensations for aphasia as needed over 20 minute moderately complex conversation with occasional min A over 2 sessions    Time 7    Period Weeks    Status On-going      SLP LONG TERM GOAL #5   Title Pt will improve score on The Communicative Participation Item Bank by 4 points    Time 7    Period Weeks    Status On-going              Plan - 12/31/20 1211     Clinical Impression Statement Jane Martin is referred to outpt ST s/p Left ICH due to aphasia. SLP targeted divergent and convergent naming while writing and reading this session. Occasional cues required to correct phonemic paraphasias and spelling errors at word and sentence levels. At this time, Jane Martin can not return to her previous job due to aphasia. I recommend skilled ST to maximize communication for safety, effective and efficient participation in conversations and for possible return to the workforce.    Speech Therapy Frequency 2x / week    Duration 12 weeks   25 visits   Treatment/Interventions SLP instruction and feedback;Environmental controls;Language facilitation;Cueing hierarchy;Compensatory strategies;Functional tasks;Cognitive reorganization;Compensatory techniques;Patient/family education;Multimodal communcation approach;Internal/external aids    Potential to Achieve Goals Good     SLP Home Exercise Plan provided    Consulted and Agree with Plan of Care Patient             Patient will benefit from skilled therapeutic intervention in order to improve the following deficits and impairments:  Aphasia    Problem List Patient Active Problem List   Diagnosis Date Noted   Acute blood loss anemia 11/10/2020   Abnormal LFTs 11/10/2020   Aphasia 10/30/2020   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

## 2021-01-01 NOTE — Therapy (Signed)
[facility] [address] [place], [place], [address] Phone: [phone]   Fax:  [phone]  Physical Therapy Treatment  Patient Details  Name: Jane Martin MRN: [id number] Date of Birth: 1995-08-13 Referring Provider (PT): Marlowe Shores, PA-C (to see Raulkar)   Encounter Date: 12/31/2020   PT End of Session - 12/31/20 1022     Visit Number 7    Number of Visits 18    Authorization Type [employer]MR/[employer]    PT Start Time 1019    PT Stop Time 1100    PT Time Calculation (min) 41 min    Equipment Utilized During Treatment Gait belt    Activity Tolerance Patient tolerated treatment well    Behavior During Therapy [facility] for tasks assessed/performed             Past Medical History:  Diagnosis Date   Stroke ([place]) 10/07/2020    Past Surgical History:  Procedure Laterality Date   CRANIOTOMY Left 10/17/2020   Procedure: CRANIOTOMY HEMATOMA Evacuation;  Surgeon: Kristeen Miss, MD;  Location: [place];  Service: Neurosurgery;  Laterality: Left;   IR ANGIO INTRA EXTRACRAN SEL COM CAROTID INNOMINATE BILAT MOD SED  10/29/2020   IR ANGIO VERTEBRAL SEL VERTEBRAL BILAT MOD SED  10/29/2020   IR US GUIDE VASC ACCESS RIGHT  10/29/2020    There were no vitals filed for this visit.   Subjective Assessment - 12/31/20 1021     Subjective No new complaints. No falls    Pertinent History No significant PMH    Patient Stated Goals Pt's goal for therapy are for RLE to stop being numb and get back to normal.    Currently in Pain? No/denies    Pain Score 0-No pain                    [facility] Adult PT Treatment/Exercise - 12/31/20 1023       Transfers   Transfers Sit to Stand;Stand to Sit    Sit to Stand 6: Modified independent (Device/Increase time);Without upper extremity assist;From chair/3-in-1    Stand to Sit 6: Modified independent (Device/Increase time);Without upper extremity assist;To chair/3-in-1      Ambulation/Gait    Ambulation/Gait Yes    Assistive device None    Gait Pattern Step-through pattern    Ambulation Surface Level;Indoor      High Level Balance   High Level Balance Comments in parallel bars with light UE support with red band around LE's above knees: side stepping in squat position, with feet/knee apart stretching band with knees bent for forward/backward walking, then with feet together with knees bent for diagonal steppping forward/backwards for 4 laps each      Neuro Re-ed    Neuro Re-ed Details  for strengthening/NMR: tall kneeling with UE support on Kaye bench: mini squats in centered position, then with alteranating lateral bias for 10 reps each with no UE support. then with single UE support and red band around knees- alternating lateral knee moving out/back in, then alternating moving knees backwards/forwards to starting position for ~10 reps each side.      Knee/Hip Exercises: Aerobic   Stepper Elliptical level 2; forward x2 min, backward x 2 min.  Verbal and tactile Cues provided to prevent R knee recurvatum                 Balance Exercises - 12/31/20 1048       Balance Exercises: Standing   Standing Eyes Opened Narrow  base of support (BOS);Foam/compliant surface;3 reps;30 secs;Limitations    Standing Eyes Opened Limitations on airex with no UE support: min guard  assist with mild postural sway noted with EC, no significant balance loss noted. no ankle instablity noted with bil stance on airex.    SLS with Vectors Foam/compliant surface;Upper extremity assist 1;Other reps (comment);Limitations    SLS with Vectors Limitations right stance on 1 inch foam with 4 color dots in semi circl pattern on floor, left UE support for right single leg squat to tap left heel to each circle for 5 reps with circles close, then 4 more reps with circles moved further away. assist for stability at knee and ankle on right.                 PT Short Term Goals - 12/25/20 1202        PT SHORT TERM GOAL #1   Title Pt will be independent with HEP for improved strength, balance, and gait.  TARGET 12/26/2020    Baseline Performing, but needs min reminder cues    Time 5    Period Weeks    Status Partially Met      PT SHORT TERM GOAL #2   Title Pt will perform 5x sit<>stand in less than or equal to 14 sec for improved transfer efficiency and functional strength.    Baseline 17.09> 14.09 sec 12/25/20    Time 5    Period Weeks    Status Achieved      PT SHORT TERM GOAL #3   Title Pt will improve single limb stance on RLE to at least 10 sec for improved balance, functional strength.    Baseline 3.5 sec; 10 sec, 3 reps 12/25/20    Time 5    Period Weeks    Status Achieved      PT SHORT TERM GOAL #4   Title Pt/family will verbalize understanding of fall prevention in home environment.    Time 5    Period Weeks    Status Achieved      PT SHORT TERM GOAL #5   Title 6 minute walk test to improve to at least 1700 ft for improved gait efficiency over longer distances.    Baseline 1589 ft> 1684 ft 12/25/20    Time 5    Period Weeks    Status Not Met               PT Long Term Goals - 11/24/20 1518       PT LONG TERM GOAL #1   Title Pt will be independent with progression of HEP for improved strength, balance, and gait.  TARGET 01/23/2021    Time 9    Period Weeks    Status New      PT LONG TERM GOAL #2   Title Pt will improve 5x sit<>stand to less than or equal to 12 sec to demonstrate improved functional strength and transfer efficiency.    Time 9    Period Weeks    Status New      PT LONG TERM GOAL #3   Title Pt will improve FGA score to at least 24/30 to decrease fall risk.    Baseline 19/30    Time 9    Period Weeks    Status New      PT LONG TERM GOAL #4   Title Pt will improve gait velocity to at least 4 ft/sec for improved gait efficiency and safety.  Baseline 3.28 ft/sec (pt reports always being a fast walker)    Time 9    Period Weeks     Status New      PT LONG TERM GOAL #5   Title Pt will ambulate at least 1000 ft, indoor and outdoor surfaces, no LOB, independently, for improved community gait.    Time 9    Period Weeks    Status New      Additional Long Term Goals   Additional Long Term Goals Yes      PT LONG TERM GOAL #6   Title Pt will improve FOTO to at least 81% (predicted FOTO at d/c) to demo improved overall functional mobility.    Baseline 68%    Time 9    Period Weeks    Status New                   Plan - 12/31/20 1023     Clinical Impression Statement Today's skilled session continued to focus on strengthening with no issues noted or reported in session. Pt continues to have mild genu recurvatum, increases when fatigued. Will plan to trial a heel wedge at next session. Also discussed with primary PT trial of Bioness both upper and lower cuffs concurrent with ex'st to see if it will help with strengthening and sensation in the right LE. The pt should benefit from continued PT to progress toward unmet goals.    Personal Factors and Comorbidities Other   No pertient PMH; severity of deficits, including aphasia   Examination-Activity Limitations Locomotion Level;Transfers;Stairs    Examination-Participation Restrictions Occupation;Community Activity;Driving    Stability/Clinical Decision Making Evolving/Moderate complexity    Rehab Potential Good    PT Frequency 2x / week   2x/wk x 8 weeks   PT Duration Other (comment)   plus 1x/wk on eval week; 9 week POC   PT Treatment/Interventions ADLs/Self Care Home Management;Gait training;Stair training;Functional mobility training;Therapeutic activities;Therapeutic exercise;Balance training;Neuromuscular re-education;Electrical Stimulation;Patient/family education;Manual techniques;Passive range of motion    PT Next Visit Plan Continue to progress HEP-theraband/weights for RLE strengthening; core stability, balance on compliant surfaces vision removed.  Tall  kneeling activities for RLE NMR.  Cues to avoid recurvatum with R knee= trail a heel wedge. Also set up Bioness to right LE once confirmed with pt she does not have any of the contraindications for use of FES.    PT Home Exercise Plan Access Code: [email or web address]    Consulted and Agree with Plan of Care Patient             Patient will benefit from skilled therapeutic intervention in order to improve the following deficits and impairments:  Abnormal gait, Difficulty walking, Decreased balance, Impaired sensation, Decreased strength, Decreased mobility  Visit Diagnosis: Muscle weakness (generalized)  Other abnormalities of gait and mobility  Unsteadiness on feet     Problem List Patient Active Problem List   Diagnosis Date Noted   Acute blood loss anemia 11/10/2020   Abnormal LFTs 11/10/2020   Aphasia 10/30/2020   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

## 2021-01-02 NOTE — Progress Notes (Signed)
I agree with the above plan 

## 2021-01-07 ENCOUNTER — Ambulatory Visit: Payer: No Typology Code available for payment source | Admitting: Occupational Therapy

## 2021-01-07 ENCOUNTER — Other Ambulatory Visit: Payer: Self-pay

## 2021-01-07 ENCOUNTER — Encounter: Payer: Self-pay | Admitting: Occupational Therapy

## 2021-01-07 ENCOUNTER — Ambulatory Visit: Payer: No Typology Code available for payment source | Attending: Physician Assistant

## 2021-01-07 ENCOUNTER — Ambulatory Visit: Payer: No Typology Code available for payment source | Admitting: Physical Therapy

## 2021-01-07 ENCOUNTER — Encounter: Payer: Self-pay | Admitting: Physical Therapy

## 2021-01-07 DIAGNOSIS — R2689 Other abnormalities of gait and mobility: Secondary | ICD-10-CM | POA: Insufficient documentation

## 2021-01-07 DIAGNOSIS — I69151 Hemiplegia and hemiparesis following nontraumatic intracerebral hemorrhage affecting right dominant side: Secondary | ICD-10-CM

## 2021-01-07 DIAGNOSIS — R278 Other lack of coordination: Secondary | ICD-10-CM | POA: Diagnosis not present

## 2021-01-07 DIAGNOSIS — R41840 Attention and concentration deficit: Secondary | ICD-10-CM

## 2021-01-07 DIAGNOSIS — R2681 Unsteadiness on feet: Secondary | ICD-10-CM | POA: Diagnosis not present

## 2021-01-07 DIAGNOSIS — M6281 Muscle weakness (generalized): Secondary | ICD-10-CM

## 2021-01-07 DIAGNOSIS — R4701 Aphasia: Secondary | ICD-10-CM | POA: Insufficient documentation

## 2021-01-07 DIAGNOSIS — R208 Other disturbances of skin sensation: Secondary | ICD-10-CM

## 2021-01-07 DIAGNOSIS — R41844 Frontal lobe and executive function deficit: Secondary | ICD-10-CM | POA: Diagnosis not present

## 2021-01-07 NOTE — Patient Instructions (Signed)
Sensory Retraining Exercises to Try at Home Sensory retraining exercises can help restore your brain's ability to interpret your senses.  All of the exercises involve your sense of touch. Each time you touch something, you send sensory stimulation to your brain and encourage your brain to rewire itself.  Repeat each exercise at least 10 times and practice for about 10-15 minutes a day. Remember, repetition and consistency are most important for a speedy recovery from stroke.  Here are several effective sensory stimulation activities for stroke patients:  1. Tabletop Touch Therapy Gather together objects with different textures and place them onto a table in front of you. Some examples of objects to grab are soft scarves, rough sandpaper, fluffy cotton balls, rough Velcro, and cool silverware.  Without looking at the objects, pick them up and feel them. Try to distinguish the difference between textures.  2. Texture Hunting Fill a bowl with uncooked rice and bury different textured objects in it, like marbles, coins, Velcro strips, and cotton balls. Then reach your hand into the bowl and try to find the objects without looking.  If you can't do this at first, keep trying. Repeated exposure to sensory retraining exercises helps keep the brain stimulated and promote recovery.  3. Texture Handling person holding a soft towel to stimulate the brain with sensory reeducation exercises Have someone place different objects in your hand with your eyes open. Sense how these objects feel.  Once you've gone through all the objects and observed how they feel, perform the exercise again with your eyes closed.  Put your focus into feeling each object to imprint the connection in your mind. Note any difference between how the objects feel with your eyes open, then closed.  4. Temperature Differentiation This sensory reeducation exercise is particularly beneficial to stroke survivors who have trouble  feeling heat or cold.  Soak a cloth in cold water and soak another cloth in warm (but not hot) water. Then, have someone place the cold cloth on your arm. Notice the sensation and if it is different than before the stroke.  After 30 seconds, have them switch the cold cloth with the warm cloth. Try to sense the difference in temperature.  Now, close your eyes. Have someone place one cloth on your arm and try to determine if you're feeling heat or cold.  Repeat this exercise back and forth alternating from hot to cold.  If you don't have someone to help you, then you can perform this exercise using your unaffected hand to place the cloths on your arm.  5. Sensory Locating Close your eyes and have someone place their hand somewhere on your arm. Then, point to the area that you think they touched.  If you don't point to the correct area, have them move your hand. Then, open your eyes to visually absorb the information.  Feedback like this helps retrain your brain. It's like telling your brain, "I was not touched here, I was touched there."  Repeat this exercise at least 10 times, preferably more!  Once you master this sensory retraining exercise, switch it up by having your assistant touch you with different textured objects, like a Q-tip or metal spoon.  Always keep your eyes closed during the exercise, and if you perform the exercise incorrectly, open your eyes once someone moves your finger to absorb the feedback.    Improving Your Ability to Feel Sensory reeducation provides the brain with the stimulation it needs to recover sensation after stroke.  It emphasizes an important concept of stroke recovery: with enough repetition, there is increased potential for healing, including the return of sensation and feeling in the affected side.  Results may not come as fast as you want or expect, but when you perform the exercises, the brain will respond. Good luck on the road to  recovery!   Source: [email or web address]-stevens.com/

## 2021-01-07 NOTE — Therapy (Signed)
[facility] [address] [place], [place], [address] Phone: [phone]   Fax:  [phone]  Occupational Therapy Treatment  Patient Details  Name: Jane Martin MRN: [id number] Date of Birth: 23-Feb-1996 Referring Provider (OT): f/u w Frann Rider, NP   Encounter Date: 01/07/2021   OT End of Session - 01/07/21 1016     Visit Number 11    Number of Visits 25    Date for OT Re-Evaluation 02/16/21    Authorization Type [facility]    OT Start Time 1014    OT Stop Time 1057    OT Time Calculation (min) 43 min    Activity Tolerance Patient tolerated treatment well    Behavior During Therapy [facility] for tasks assessed/performed             Past Medical History:  Diagnosis Date   Stroke ([place]) 10/07/2020    Past Surgical History:  Procedure Laterality Date   CRANIOTOMY Left 10/17/2020   Procedure: CRANIOTOMY HEMATOMA Evacuation;  Surgeon: Kristeen Miss, MD;  Location: [facility];  Service: Neurosurgery;  Laterality: Left;   IR ANGIO INTRA EXTRACRAN SEL COM CAROTID INNOMINATE BILAT MOD SED  10/29/2020   IR ANGIO VERTEBRAL SEL VERTEBRAL BILAT MOD SED  10/29/2020   IR US GUIDE VASC ACCESS RIGHT  10/29/2020    There were no vitals filed for this visit.   Subjective Assessment - 01/07/21 1016     Subjective  "i am doing alright"    Pertinent History None    Limitations Aphasia (expressive and receptive), Absent sensation on R hemi    Patient Stated Goals "to be able to feel myself, do for myself, drive"    Currently in Pain? No/denies              Pt arrived from PT and escorted to table in therapy gym. At conclusion of session, patient was escorted to [place] educated patient on continuing to do everyday and functional tasks for increasing independence and increasing functional use of RUE. Pt continues to report absent sensation and that things are "different" so she has not been doing some tasks (i.e.  cooking).  Reviewed Sensory Stimulation activities for reeducation. See pt instructions. Gave handout to patient.  Environmental Scanning with 14/15 accuracy = 93% on first pass. Pt required no additional cues for locating remaining item on second pass. Goal Met.  Constant Therapy Alternating Symbols level 6 with 97% accuracy and 42.84s response time. Alphabetizing Alternating Words level 1 with 88% accuracy and 69.83s response time.  Hand Gripper: with RUE on level 2 with black spring. Pt picked up 1 inch blocks with gripper with min drops and min difficulty.                      OT Education - 01/07/21 1039     Education Details sensory reeducation activities for stimulation reviewed    Person(s) Educated Patient    Methods Explanation;Verbal cues;Handout;Demonstration    Comprehension Verbalized understanding;Need further instruction              OT Short Term Goals - 01/07/21 1033       OT SHORT TERM GOAL #1   Title Pt will be independent with HEP for RUE coordination and grip strength    Time 4    Period Weeks    Status Achieved   coordination HEP, theraputty red 12/22/20   Target Date 12/22/20  OT SHORT TERM GOAL #2   Title Pt will verbalize understanding of sensory strategies for increased safety awareness    Time 4    Period Weeks    Status On-going   reviewed sensory strategies for safety, still needing cues 12/22/20     OT SHORT TERM GOAL #3   Title Pt will perform environmental scanning in moderately distracting environment with 90% accuracy or greater.    Time 4    Period Weeks    Status Achieved   93% 01/07/21     OT SHORT TERM GOAL #4   Title Pt will improve box and blocks score by 4 blocks or more with RUE for increase in overall functional use of RUE.    Baseline R 21 L 51    Time 4    Period Weeks    Status Achieved   RUE 30 blocks 12/22/20     OT SHORT TERM GOAL #5   Title Pt will perform simple warm meal prep with supervision  and good safety awareness    Time 4    Period Weeks    Status Achieved   supervision 12/22/20              OT Long Term Goals - 01/07/21 1053       OT LONG TERM GOAL #1   Title Pt will be independent with updated HEPs    Time 12    Period Weeks    Status New      OT LONG TERM GOAL #2   Title Pt will increase grip strength in RUE by 8 lbs or greater for increasein functional use of RUE, dominant, hand.    Baseline R 24.6, L 42.1    Time 12    Period Weeks    Status New      OT LONG TERM GOAL #3   Title Pt will complete 9 hole peg test in 2 minutes or less with RUE for increase in fine motor coordination    Baseline R 8 pegs in 2 min, 30 seconds L 24.63s    Time 12    Period Weeks    Status New      OT LONG TERM GOAL #4   Title Pt will complete physical and cognitive task simultaneously with 90% accuracy or greater    Time 12    Period Weeks    Status New      OT LONG TERM GOAL #5   Title Pt will demonstrate or report increased ease with styling her own hair with bilateral UE.    Time 12    Period Weeks    Status New      OT LONG TERM GOAL #6   Title Pt and caregiver will verbalize undersatnding for return to driving and return to work recommendations.    Time 12    Period Weeks    Status On-going      OT LONG TERM GOAL #7   Title Pt will score a 57 or greater on FOTO at discharge.    Baseline 38    Time 12    Period Weeks    Status New                   Plan - 01/07/21 1038     Clinical Impression Statement Pt continues avoiding using RUE and inattention to RUE d/t absent sensation. Encouraged to continue using in functional tasks.    OT Occupational Profile and History Problem  Focused Assessment - Including review of records relating to presenting problem    Occupational performance deficits (Please refer to evaluation for details): IADL's;ADL's;Leisure;Work    Marketing executive / Function / Physical Skills ADL;Decreased knowledge of use of  DME;Strength;Dexterity;GMC;Proprioception;UE functional use;ROM;IADL;Vision;Decreased knowledge of precautions;FMC;Sensation;Coordination    Cognitive Skills Problem Solve;Understand;Perception;Thought;Sequencing    Rehab Potential Good    Clinical Decision Making Limited treatment options, no task modification necessary    Comorbidities Affecting Occupational Performance: None    Modification or Assistance to Complete Evaluation  No modification of tasks or assist necessary to complete eval    OT Frequency 2x / week    OT Duration 12 weeks    OT Treatment/Interventions Self-care/ADL training;Fluidtherapy;DME and/or AE instruction;Therapeutic activities;Cognitive remediation/compensation;Visual/perceptual remediation/compensation;Patient/family education;Passive range of motion;Manual Therapy;Energy conservation;Electrical Stimulation;Neuromuscular education;Therapist, nutritional;Therapeutic exercise    Plan review theraband exercises, if pt demonstrates good contol then issue with red band, quadruped for weight bearing/proprioception RUE    Consulted and Agree with Plan of Care Patient             Patient will benefit from skilled therapeutic intervention in order to improve the following deficits and impairments:   Body Structure / Function / Physical Skills: ADL, Decreased knowledge of use of DME, Strength, Dexterity, GMC, Proprioception, UE functional use, ROM, IADL, Vision, Decreased knowledge of precautions, FMC, Sensation, Coordination Cognitive Skills: Problem Solve, Understand, Perception, Thought, Sequencing     Visit Diagnosis: Muscle weakness (generalized)  Unsteadiness on feet  Other disturbances of skin sensation  Other lack of coordination  Attention and concentration deficit  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

## 2021-01-07 NOTE — Therapy (Signed)
[facility] [address] [place], [place], [address] Phone: [phone]   Fax:  [phone]  Speech Language Pathology Treatment  Patient Details  Name: Jane Martin MRN: [id number] Date of Birth: [birth year] Referring Provider (SLP): Dr. Sula Soda   Encounter Date: 01/07/2021   End of Session - 01/07/21 1114     Visit Number 11    Number of Visits 25    Date for SLP Re-Evaluation 02/16/21    Authorization Type no auth; 130 VL PT/OT/ST combined    SLP Start Time 1057    SLP Stop Time  1142    SLP Time Calculation (min) 45 min    Activity Tolerance Patient tolerated treatment well             Past Medical History:  Diagnosis Date   Stroke ([facility]) 10/07/2020    Past Surgical History:  Procedure Laterality Date   CRANIOTOMY Left 10/17/2020   Procedure: CRANIOTOMY HEMATOMA Evacuation;  Surgeon: Barnett Abu, MD;  Location: [facility] OR;  Service: Neurosurgery;  Laterality: Left;   IR ANGIO INTRA EXTRACRAN SEL COM CAROTID INNOMINATE BILAT MOD SED  10/29/2020   IR ANGIO VERTEBRAL SEL VERTEBRAL BILAT MOD SED  10/29/2020   IR US GUIDE VASC ACCESS RIGHT  10/29/2020    There were no vitals filed for this visit.   Subjective Assessment - 01/07/21 1058     Subjective "I've been reading"    Currently in Pain? No/denies                   ADULT SLP TREATMENT - 01/07/21 1057       General Information   Behavior/Cognition Alert;Cooperative;Pleasant mood      Treatment Provided   Treatment provided Cognitive-Linquistic      Cognitive-Linquistic Treatment   Treatment focused on Aphasia;Patient/family/caregiver education    Skilled Treatment Pt continues to read [employer] as instructed, which is mildy improving per patient report. SLP targeted response to open-ended questions within conversation, in which pt able to generate responses with dysnomia x8 in 30+ minute conversation. Usual self-corrections  exhibited for dysnomia. SLP provided rare min semantic and phonemic cues to correct dysnomia and articulation errors if pt unable to self-correct independently.      Assessment / Recommendations / Plan   Plan Continue with current plan of care      Progression Toward Goals   Progression toward goals Progressing toward goals              SLP Education - 01/07/21 1427     Education Details awareness and corrections for dysnomia, anomia compensations    Person(s) Educated Patient    Methods Explanation;Demonstration    Comprehension Verbalized understanding;Returned demonstration;Need further instruction              SLP Short Term Goals - 12/31/20 1120       SLP SHORT TERM GOAL #1   Title Pt will name 10 items in [place] relevant category with rare min A over 2 sessions    Baseline 12-22-20, 12-31-20    Time --    Period --    Status Achieved      SLP SHORT TERM GOAL #2   Title Pt will utilize 2 compensatory strategies for aphasia in structured language tasks 8/10 opportunities with occasional min A over 2 sessions    Baseline 12-22-20; 12/29/20    Status Achieved      SLP SHORT TERM GOAL #3   Title  Pt will comprehend 3 sentence paragraph with rare min A over 2 sessions    Baseline 12-25-20, 12/29/20    Status Achieved      SLP SHORT TERM GOAL #4   Title Pt will write at sentence level, IDing errors with occasional min 8/10 errors    Baseline 12-15-20, 12-31-20    Time --    Period --    Status Achieved      SLP SHORT TERM GOAL #5   Title Pt will carryover compensations for aphasia during 12 minute simple conversation as needed with occasional min A    Baseline 12-31-20    Time --    Period --    Status Achieved              SLP Long Term Goals - 01/07/21 1115       SLP LONG TERM GOAL #1   Title Pt will complete complex naming tasks with 90% accuracy and occasional min A over 2 sessions    Time 6    Period Weeks    Status On-going      SLP LONG TERM  GOAL #2   Title Pt will comprehend 3 paragraph passages of interest to her, restating 2 salient details from each paragraph with occasional min A    Time 6    Period Weeks    Status On-going      SLP LONG TERM GOAL #3   Title Pt will ID and correct errors on texts, e mails and other 4 sentence written communications 9/10 with occasional min A    Time 6    Period Weeks    Status On-going      SLP LONG TERM GOAL #4   Title Pt will carryover compensations for aphasia as needed over 20 minute moderately complex conversation with occasional min A over 2 sessions    Baseline 01-07-21    Time 6    Period Weeks    Status On-going      SLP LONG TERM GOAL #5   Title Pt will improve score on The Communicative Participation Item Bank by 4 points    Time 6    Period Weeks    Status On-going              Plan - 01/07/21 1115     Clinical Impression Statement Jane Martin is referred to outpt ST s/p Left ICH due to aphasia. SLP targeted mod complex open-ended questions and conversation, in which pt exhibited occasional dysnomia with usual self-corrections. Rare semantic and phonemic cues provided to aid dysnomia. At this time, Jane Martin can not return to her previous job due to aphasia. I recommend skilled ST to maximize communication for safety, effective and efficient participation in conversations and for possible return to the workforce.    Speech Therapy Frequency 2x / week    Duration 12 weeks   25 visits   Treatment/Interventions SLP instruction and feedback;Environmental controls;Language facilitation;Cueing hierarchy;Compensatory strategies;Functional tasks;Cognitive reorganization;Compensatory techniques;Patient/family education;Multimodal communcation approach;Internal/external aids    Potential to Achieve Goals Good    SLP Home Exercise Plan provided    Consulted and Agree with Plan of Care Patient             Patient will benefit from skilled therapeutic intervention in  order to improve the following deficits and impairments:   Aphasia    Problem List Patient Active Problem List   Diagnosis Date Noted   Acute blood loss anemia 11/10/2020   Abnormal LFTs  11/10/2020   Aphasia 10/30/2020   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 [place], [place], [address] Phone: [phone]   Fax:  [phone]   Name: Jane Martin MRN: [id number] Date of Birth: Mar 18, 1996

## 2021-01-08 ENCOUNTER — Encounter (HOSPITAL_COMMUNITY): Payer: Self-pay

## 2021-01-08 ENCOUNTER — Ambulatory Visit (HOSPITAL_COMMUNITY): Payer: No Typology Code available for payment source

## 2021-01-08 NOTE — Therapy (Signed)
[place] [address] [place], [place], [address] Phone: [phone]   Fax:  [phone]  Physical Therapy Treatment  Patient Details  Name: Jane Martin MRN: [id number] Date of Birth: [birth year] Referring Provider (PT): Marlowe Shores, PA-C (to see Raulkar)   Encounter Date: 01/07/2021   PT End of Session - 01/07/21 0936     Visit Number 8    Number of Visits 18    Authorization Type UMR/[employer]    PT Start Time 0932    PT Stop Time 1015    PT Time Calculation (min) 43 min    Equipment Utilized During Treatment Gait belt    Activity Tolerance Patient tolerated treatment well    Behavior During Therapy [facility] for tasks assessed/performed             Past Medical History:  Diagnosis Date   Stroke ([place]) 10/07/2020    Past Surgical History:  Procedure Laterality Date   CRANIOTOMY Left 10/17/2020   Procedure: CRANIOTOMY HEMATOMA Evacuation;  Surgeon: Kristeen Miss, MD;  Location: [place];  Service: Neurosurgery;  Laterality: Left;   IR ANGIO INTRA EXTRACRAN SEL COM CAROTID INNOMINATE BILAT MOD SED  10/29/2020   IR ANGIO VERTEBRAL SEL VERTEBRAL BILAT MOD SED  10/29/2020   IR US GUIDE VASC ACCESS RIGHT  10/29/2020    There were no vitals filed for this visit.   Subjective Assessment - 01/07/21 0935     Subjective No new complaints. No falls or pain to report. "still numb"    Pertinent History No significant PMH    Patient Stated Goals Pt's goal for therapy are for RLE to stop being numb and get back to normal.    Currently in Pain? No/denies    Pain Score 0-No pain                     [facility] Adult PT Treatment/Exercise - 01/07/21 0936       Transfers   Transfers Sit to Stand;Stand to Sit    Sit to Stand 6: Modified independent (Device/Increase time);Without upper extremity assist;From chair/3-in-1    Stand to Sit 6: Modified independent (Device/Increase time);Without upper extremity assist;To  chair/3-in-1      Ambulation/Gait   Ambulation/Gait Yes    Ambulation/Gait Assistance 7: Independent    Assistive device None    Gait Pattern Step-through pattern    Ambulation Surface Level;Indoor      High Level Balance   High Level Balance Activities Side stepping;Marching forwards;Marching backwards;Tandem walking   tandem/heel/toe walking forward/backward   High Level Balance Comments blue/red mats next to counter top: 3 laps each/each way with cues on form and technique. occasional support on counter for balance with min guard to min assist.      Neuro Re-ed    Neuro Re-ed Details  for strengthening/NMR: tall kneeling with intermittent touch to red pball for mini squats in neurtral x 10 reps, then alternating lateral bias for 10 reps each way. min guard assist for safety.      Knee/Hip Exercises: Aerobic   Stepper Elliptical level 2; forward x2 min, backward x 2 min.  Verbal and tactile Cues provided to prevent R knee recurvatum                 Balance Exercises - 01/07/21 0956       Balance Exercises: Standing   Other Standing Exercises BOSU- alternating forward step ups with contralateral march for 10 reps each side  with light UE support; then laterally stepping floor<>both feet on BOSU<>off other side for 5 reps toward each side, light UE support; on inverted BOSU- rocking anterior/posterior direction, then laterally for 10 reps each with emphasis on knee control and posture with no UE support, min guard assist; holdingthe BOSU steady for EC 30 sec's x 3 reps, progressing to EC with head movements left<>right and up<>down for ~10 reps each. min guard to min assist for balance.                 PT Short Term Goals - 12/25/20 1202       PT SHORT TERM GOAL #1   Title Pt will be independent with HEP for improved strength, balance, and gait.  TARGET 12/26/2020    Baseline Performing, but needs min reminder cues    Time 5    Period Weeks    Status Partially Met       PT SHORT TERM GOAL #2   Title Pt will perform 5x sit<>stand in less than or equal to 14 sec for improved transfer efficiency and functional strength.    Baseline 17.09> 14.09 sec 12/25/20    Time 5    Period Weeks    Status Achieved      PT SHORT TERM GOAL #3   Title Pt will improve single limb stance on RLE to at least 10 sec for improved balance, functional strength.    Baseline 3.5 sec; 10 sec, 3 reps 12/25/20    Time 5    Period Weeks    Status Achieved      PT SHORT TERM GOAL #4   Title Pt/family will verbalize understanding of fall prevention in home environment.    Time 5    Period Weeks    Status Achieved      PT SHORT TERM GOAL #5   Title 6 minute walk test to improve to at least 1700 ft for improved gait efficiency over longer distances.    Baseline 1589 ft> 1684 ft 12/25/20    Time 5    Period Weeks    Status Not Met               PT Long Term Goals - 11/24/20 1518       PT LONG TERM GOAL #1   Title Pt will be independent with progression of HEP for improved strength, balance, and gait.  TARGET 01/23/2021    Time 9    Period Weeks    Status New      PT LONG TERM GOAL #2   Title Pt will improve 5x sit<>stand to less than or equal to 12 sec to demonstrate improved functional strength and transfer efficiency.    Time 9    Period Weeks    Status New      PT LONG TERM GOAL #3   Title Pt will improve FGA score to at least 24/30 to decrease fall risk.    Baseline 19/30    Time 9    Period Weeks    Status New      PT LONG TERM GOAL #4   Title Pt will improve gait velocity to at least 4 ft/sec for improved gait efficiency and safety.    Baseline 3.28 ft/sec (pt reports always being a fast walker)    Time 9    Period Weeks    Status New      PT LONG TERM GOAL #5   Title Pt will ambulate at  least 1000 ft, indoor and outdoor surfaces, no LOB, independently, for improved community gait.    Time 9    Period Weeks    Status New      Additional Long Term  Goals   Additional Long Term Goals Yes      PT LONG TERM GOAL #6   Title Pt will improve FOTO to at least 81% (predicted FOTO at d/c) to demo improved overall functional mobility.    Baseline 68%    Time 9    Period Weeks    Status New                   Plan - 01/07/21 0936     Clinical Impression Statement Today's skilled session continued to focus on strengthening and balance training with emphasis on knee control to prevent recurvatum. No issues noted or reported in session. The pt is progressing toward goals and should benefit from continued PT to progress toward unmet goals.    Personal Factors and Comorbidities Other   No pertient PMH; severity of deficits, including aphasia   Examination-Activity Limitations Locomotion Level;Transfers;Stairs    Examination-Participation Restrictions Occupation;Community Activity;Driving    Stability/Clinical Decision Making Evolving/Moderate complexity    Rehab Potential Good    PT Frequency 2x / week   2x/wk x 8 weeks   PT Duration Other (comment)   plus 1x/wk on eval week; 9 week POC   PT Treatment/Interventions ADLs/Self Care Home Management;Gait training;Stair training;Functional mobility training;Therapeutic activities;Therapeutic exercise;Balance training;Neuromuscular re-education;Electrical Stimulation;Patient/family education;Manual techniques;Passive range of motion    PT Next Visit Plan Trial a heel wedge for knee control- I forgot to this session. Set up Bioness if pt brings shorts for lower cuff and on quads to assist with knee control. Continue to progress HEP-theraband/weights for RLE strengthening; core stability, balance on compliant surfaces vision removed.  Tall kneeling activities for RLE NMR.  Cues to avoid recurvatum with R knee.    PT Home Exercise Plan Access Code: [email or web address]    Consulted and Agree with Plan of Care Patient             Patient will benefit from skilled therapeutic intervention in order to improve  the following deficits and impairments:  Abnormal gait, Difficulty walking, Decreased balance, Impaired sensation, Decreased strength, Decreased mobility  Visit Diagnosis: Muscle weakness (generalized)  Unsteadiness on feet  Other abnormalities of gait and mobility     Problem List Patient Active Problem List   Diagnosis Date Noted   Acute blood loss anemia 11/10/2020   Abnormal LFTs 11/10/2020   Aphasia 10/30/2020   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, [place], [place] [address] [phone] 01/08/21, 1:17 PM    Name: IMOGENE GRAVELLE MRN: [id number] Date of Birth: 02-25-1996

## 2021-01-09 ENCOUNTER — Encounter: Payer: Self-pay | Admitting: Physical Therapy

## 2021-01-09 ENCOUNTER — Ambulatory Visit: Payer: No Typology Code available for payment source | Admitting: Occupational Therapy

## 2021-01-09 ENCOUNTER — Encounter: Payer: Self-pay | Admitting: Occupational Therapy

## 2021-01-09 ENCOUNTER — Ambulatory Visit: Payer: No Typology Code available for payment source | Admitting: Physical Therapy

## 2021-01-09 ENCOUNTER — Other Ambulatory Visit: Payer: Self-pay

## 2021-01-09 ENCOUNTER — Ambulatory Visit: Payer: No Typology Code available for payment source

## 2021-01-09 DIAGNOSIS — R2689 Other abnormalities of gait and mobility: Secondary | ICD-10-CM

## 2021-01-09 DIAGNOSIS — R41840 Attention and concentration deficit: Secondary | ICD-10-CM | POA: Diagnosis not present

## 2021-01-09 DIAGNOSIS — R2681 Unsteadiness on feet: Secondary | ICD-10-CM

## 2021-01-09 DIAGNOSIS — R278 Other lack of coordination: Secondary | ICD-10-CM | POA: Diagnosis not present

## 2021-01-09 DIAGNOSIS — R41844 Frontal lobe and executive function deficit: Secondary | ICD-10-CM | POA: Diagnosis not present

## 2021-01-09 DIAGNOSIS — M6281 Muscle weakness (generalized): Secondary | ICD-10-CM

## 2021-01-09 DIAGNOSIS — R4701 Aphasia: Secondary | ICD-10-CM | POA: Diagnosis not present

## 2021-01-09 DIAGNOSIS — I69151 Hemiplegia and hemiparesis following nontraumatic intracerebral hemorrhage affecting right dominant side: Secondary | ICD-10-CM

## 2021-01-09 DIAGNOSIS — R208 Other disturbances of skin sensation: Secondary | ICD-10-CM | POA: Diagnosis not present

## 2021-01-09 NOTE — Therapy (Signed)
[facility] [address] [place], [place], [address] Phone: [phone]   Fax:  [phone]  Occupational Therapy Treatment  Patient Details  Name: Jane Martin MRN: [id number] Date of Birth: 01/20/1996 Referring Provider (OT): f/u w Ihor Austin, NP   Encounter Date: 01/09/2021   OT End of Session - 01/09/21 0846     Visit Number 12    Number of Visits 25    Date for OT Re-Evaluation 02/16/21    Authorization Type [facility] UMR    OT Start Time 0845    OT Stop Time 0930    OT Time Calculation (min) 45 min    Activity Tolerance Patient tolerated treatment well    Behavior During Therapy [facility] for tasks assessed/performed             Past Medical History:  Diagnosis Date   Stroke ([facility]) 10/07/2020    Past Surgical History:  Procedure Laterality Date   CRANIOTOMY Left 10/17/2020   Procedure: CRANIOTOMY HEMATOMA Evacuation;  Surgeon: Barnett Abu, MD;  Location: [facility] OR;  Service: Neurosurgery;  Laterality: Left;   IR ANGIO INTRA EXTRACRAN SEL COM CAROTID INNOMINATE BILAT MOD SED  10/29/2020   IR ANGIO VERTEBRAL SEL VERTEBRAL BILAT MOD SED  10/29/2020   IR US GUIDE VASC ACCESS RIGHT  10/29/2020    There were no vitals filed for this visit.   Subjective Assessment - 01/09/21 0845     Subjective  "nothing really"    Pertinent History None    Limitations Aphasia (expressive and receptive), Absent sensation on R hemi    Patient Stated Goals "to be able to feel myself, do for myself, drive"    Currently in Pain? No/denies             Pt received from PT.   Quadruped cat/cow and unilateral LUE raise for increasing weight bearing into RUE. X 12 reps  Resistance Clothespins 1-8# with RUE for increasing grip and sustained grasp. Pt with dysmetria while reaching for antenna with clothespins d/t decreased proprioception.  Medium Pegs with working on shoulder stability and proprioception of RUE with reaching to  vertical surface for medium pegs.   Handwriting trialing weighted pen, built up felt pen and regular pen. Pt with most success with weighted pen. OT provided education on taking preferred writing utensil to [employer] or home improvement store to get nuts to fit pen and place on with rubber bands for weighted.  Pt escorted to ST.                    OT Short Term Goals - 01/07/21 1033       OT SHORT TERM GOAL #1   Title Pt will be independent with HEP for RUE coordination and grip strength    Time 4    Period Weeks    Status Achieved   coordination HEP, theraputty red 12/22/20   Target Date 12/22/20      OT SHORT TERM GOAL #2   Title Pt will verbalize understanding of sensory strategies for increased safety awareness    Time 4    Period Weeks    Status On-going   reviewed sensory strategies for safety, still needing cues 12/22/20     OT SHORT TERM GOAL #3   Title Pt will perform environmental scanning in moderately distracting environment with 90% accuracy or greater.    Time 4    Period Weeks    Status Achieved   93%  01/07/21     OT SHORT TERM GOAL #4   Title Pt will improve box and blocks score by 4 blocks or more with RUE for increase in overall functional use of RUE.    Baseline R 21 L 51    Time 4    Period Weeks    Status Achieved   RUE 30 blocks 12/22/20     OT SHORT TERM GOAL #5   Title Pt will perform simple warm meal prep with supervision and good safety awareness    Time 4    Period Weeks    Status Achieved   supervision 12/22/20              OT Long Term Goals - 01/07/21 1053       OT LONG TERM GOAL #1   Title Pt will be independent with updated HEPs    Time 12    Period Weeks    Status New      OT LONG TERM GOAL #2   Title Pt will increase grip strength in RUE by 8 lbs or greater for increasein functional use of RUE, dominant, hand.    Baseline R 24.6, L 42.1    Time 12    Period Weeks    Status New      OT LONG TERM GOAL #3   Title  Pt will complete 9 hole peg test in 2 minutes or less with RUE for increase in fine motor coordination    Baseline R 8 pegs in 2 min, 30 seconds L 24.63s    Time 12    Period Weeks    Status New      OT LONG TERM GOAL #4   Title Pt will complete physical and cognitive task simultaneously with 90% accuracy or greater    Time 12    Period Weeks    Status New      OT LONG TERM GOAL #5   Title Pt will demonstrate or report increased ease with styling her own hair with bilateral UE.    Time 12    Period Weeks    Status New      OT LONG TERM GOAL #6   Title Pt and caregiver will verbalize undersatnding for return to driving and return to work recommendations.    Time 12    Period Weeks    Status On-going      OT LONG TERM GOAL #7   Title Pt will score a 57 or greater on FOTO at discharge.    Baseline 38    Time 12    Period Weeks    Status New                   Plan - 01/09/21 0901     Clinical Impression Statement Pt with significant limitations with sensation in RUE.    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 ADL;Decreased knowledge of use of DME;Strength;Dexterity;GMC;Proprioception;UE functional use;ROM;IADL;Vision;Decreased knowledge of precautions;FMC;Sensation;Coordination    Cognitive Skills Problem Solve;Understand;Perception;Thought;Sequencing    Rehab Potential Good    Clinical Decision Making Limited treatment options, no task modification necessary    Comorbidities Affecting Occupational Performance: None    Modification or Assistance to Complete Evaluation  No modification of tasks or assist necessary to complete eval  OT Frequency 2x / week    OT Duration 12 weeks    OT Treatment/Interventions Self-care/ADL  training;Fluidtherapy;DME and/or AE instruction;Therapeutic activities;Cognitive remediation/compensation;Visual/perceptual remediation/compensation;Patient/family education;Passive range of motion;Manual Therapy;Energy conservation;Electrical Stimulation;Neuromuscular education;Building services engineer;Therapeutic exercise    Plan review theraband exercises, if pt demonstrates good contol then issue with red band, quadruped for weight bearing/proprioception RUE    Consulted and Agree with Plan of Care Patient             Patient will benefit from skilled therapeutic intervention in order to improve the following deficits and impairments:   Body Structure / Function / Physical Skills: ADL, Decreased knowledge of use of DME, Strength, Dexterity, GMC, Proprioception, UE functional use, ROM, IADL, Vision, Decreased knowledge of precautions, FMC, Sensation, Coordination Cognitive Skills: Problem Solve, Understand, Perception, Thought, Sequencing     Visit Diagnosis: Muscle weakness (generalized)  Frontal lobe and executive function deficit  Unsteadiness on feet  Other disturbances of skin sensation  Other lack of coordination  Attention and concentration deficit  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)  Other abnormalities of gait and mobility    Problem List Patient Active Problem List   Diagnosis Date Noted   Acute blood loss anemia 11/10/2020   Abnormal LFTs 11/10/2020   Aphasia 10/30/2020   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

## 2021-01-09 NOTE — Therapy (Signed)
[facility] [address] [place], [place], [address] Phone: [phone]   Fax:  [phone]  Speech Language Pathology Treatment  Patient Details  Name: Jane Martin MRN: [id number] Date of Birth: [birth year] Referring Provider (SLP): Dr. Sula Soda   Encounter Date: 01/09/2021   End of Session - 01/09/21 1208     Visit Number 12    Number of Visits 25    Date for SLP Re-Evaluation 02/16/21    Authorization Type no auth; 130 VL PT/OT/ST combined    SLP Start Time 0931    SLP Stop Time  1015    SLP Time Calculation (min) 44 min    Activity Tolerance Patient tolerated treatment well             Past Medical History:  Diagnosis Date   Stroke (HCC) 10/07/2020    Past Surgical History:  Procedure Laterality Date   CRANIOTOMY Left 10/17/2020   Procedure: CRANIOTOMY HEMATOMA Evacuation;  Surgeon: Barnett Abu, MD;  Location: [facility] OR;  Service: Neurosurgery;  Laterality: Left;   IR ANGIO INTRA EXTRACRAN SEL COM CAROTID INNOMINATE BILAT MOD SED  10/29/2020   IR ANGIO VERTEBRAL SEL VERTEBRAL BILAT MOD SED  10/29/2020   IR US GUIDE VASC ACCESS RIGHT  10/29/2020    There were no vitals filed for this visit.   Subjective Assessment - 01/09/21 0932     Subjective "I've been reading articles on instagram"    Currently in Pain? No/denies                   ADULT SLP TREATMENT - 01/09/21 0932       General Information   Behavior/Cognition Alert;Cooperative;Pleasant mood      Treatment Provided   Treatment provided Cognitive-Linquistic      Cognitive-Linquistic Treatment   Treatment focused on Aphasia;Patient/family/caregiver education    Skilled Treatment Pt indicates she has been reading aloud articles on Instagram. SLP recommended patient read aloud with [employer], as the app is able to read it aloud and help her correct any dysnomia or articulation errors. SLP targeted reading aloud article,  in which pt required rare verbal and visual cues to correct dysnomia. Occasional self-corrections exhibited. Pt stated "I need to slow down" and SLP reinforced slow rate to increase accuracy. SLP targeted articulation of multsyllabic words, in which pt required increased verbal and visual cues to segment words into syllables for accurate production as number of syllables increased.      Assessment / Recommendations / Plan   Plan Continue with current plan of care      Progression Toward Goals   Progression toward goals Progressing toward goals              SLP Education - 01/09/21 1208     Education Details [employer], slow rate, syllables    Person(s) Educated Patient    Methods Explanation;Demonstration;Handout    Comprehension Verbalized understanding;Returned demonstration;Need further instruction              SLP Short Term Goals - 12/31/20 1120       SLP SHORT TERM GOAL #1   Title Pt will name 10 items in persoanlly relevant category with rare min A over 2 sessions    Baseline 12-22-20, 12-31-20    Time --    Period --    Status Achieved      SLP SHORT TERM GOAL #2   Title Pt will utilize 2 compensatory strategies for  aphasia in structured language tasks 8/10 opportunities with occasional min A over 2 sessions    Baseline 12-22-20; 12/29/20    Status Achieved      SLP SHORT TERM GOAL #3   Title Pt will comprehend 3 sentence paragraph with rare min A over 2 sessions    Baseline 12-25-20, 12/29/20    Status Achieved      SLP SHORT TERM GOAL #4   Title Pt will write at sentence level, IDing errors with occasional min 8/10 errors    Baseline 12-15-20, 12-31-20    Time --    Period --    Status Achieved      SLP SHORT TERM GOAL #5   Title Pt will carryover compensations for aphasia during 12 minute simple conversation as needed with occasional min A    Baseline 12-31-20    Time --    Period --    Status Achieved              SLP Long Term Goals - 01/09/21  1209       SLP LONG TERM GOAL #1   Title Pt will complete complex naming tasks with 90% accuracy and occasional min A over 2 sessions    Time 6    Period Weeks    Status On-going      SLP LONG TERM GOAL #2   Title Pt will comprehend 3 paragraph passages of interest to her, restating 2 salient details from each paragraph with occasional min A    Time 6    Period Weeks    Status On-going      SLP LONG TERM GOAL #3   Title Pt will ID and correct errors on texts, e mails and other 4 sentence written communications 9/10 with occasional min A    Time 6    Period Weeks    Status On-going      SLP LONG TERM GOAL #4   Title Pt will carryover compensations for aphasia as needed over 20 minute moderately complex conversation with occasional min A over 2 sessions    Baseline 01-07-21    Time 6    Period Weeks    Status On-going      SLP LONG TERM GOAL #5   Title Pt will improve score on The Communicative Participation Item Bank by 4 points    Time 6    Period Weeks    Status On-going              Plan - 01/09/21 1209     Clinical Impression Statement Jane Martin is referred to outpt ST s/p Left ICH due to aphasia. SLP targeted reading aloud and production of multisyllabic words as pt exhibited difficulty naming and articulating words with increased number of syllables. Intermittent verbal and visual cues required to slow rate and segment syllables to improve accuracy of production. At this time, Jane Martin can not return to her previous job due to aphasia. I recommend skilled ST to maximize communication for safety, effective and efficient participation in conversations and for possible return to the workforce.    Speech Therapy Frequency 2x / week    Duration 12 weeks   25 visits   Treatment/Interventions SLP instruction and feedback;Environmental controls;Language facilitation;Cueing hierarchy;Compensatory strategies;Functional tasks;Cognitive reorganization;Compensatory  techniques;Patient/family education;Multimodal communcation approach;Internal/external aids    Potential to Achieve Goals Good    SLP Home Exercise Plan provided    Consulted and Agree with Plan of Care Patient  Patient will benefit from skilled therapeutic intervention in order to improve the following deficits and impairments:   Aphasia    Problem List Patient Active Problem List   Diagnosis Date Noted   Acute blood loss anemia 11/10/2020   Abnormal LFTs 11/10/2020   Aphasia 10/30/2020   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 [place], [place], [address] Phone: [phone]   Fax:  [phone]   Name: Jane Martin MRN: [id number] Date of Birth: May 17, 1996

## 2021-01-09 NOTE — Therapy (Signed)
[facility] [address] [place], [place], [address] Phone: [phone]   Fax:  [phone]  Physical Therapy Treatment  Patient Details  Name: MASSIEL STIPP MRN: [id number] Date of Birth: [birth year] Referring Provider (PT): Marlowe Shores, PA-C (to see Raulkar)   Encounter Date: 01/09/2021   PT End of Session - 01/09/21 0805     Visit Number 9    Number of Visits 18    Authorization Type UMR/Medcost    PT Start Time 0805    PT Stop Time 6759    PT Time Calculation (min) 39 min    Equipment Utilized During Treatment Gait belt    Activity Tolerance Patient tolerated treatment well    Behavior During Therapy [facility] for tasks assessed/performed             Past Medical History:  Diagnosis Date   Stroke ([place]) 10/07/2020    Past Surgical History:  Procedure Laterality Date   CRANIOTOMY Left 10/17/2020   Procedure: CRANIOTOMY HEMATOMA Evacuation;  Surgeon: Kristeen Miss, MD;  Location: [facility];  Service: Neurosurgery;  Laterality: Left;   IR ANGIO INTRA EXTRACRAN SEL COM CAROTID INNOMINATE BILAT MOD SED  10/29/2020   IR ANGIO VERTEBRAL SEL VERTEBRAL BILAT MOD SED  10/29/2020   IR US GUIDE VASC ACCESS RIGHT  10/29/2020    There were no vitals filed for this visit.   Subjective Assessment - 01/09/21 0805     Subjective She said I might try a machine of some sort (PT explained rationale for Bioness).    Pertinent History No significant PMH    Patient Stated Goals Pt's goal for therapy are for RLE to stop being numb and get back to normal.    Currently in Pain? No/denies                               [facility] Adult PT Treatment/Exercise - 01/09/21 0001       Ambulation/Gait   Ambulation/Gait Yes    Ambulation/Gait Assistance 7: Independent    Ambulation Distance (Feet) 460 Feet   1000 ft outdoors   Assistive device None    Gait Pattern Step-through pattern;Right genu recurvatum   occasional R  knee recurvatum with fatigue   Ambulation Surface Level;Indoor;Unlevel;Outdoor    Gait Comments Initial 115 ft of gait with heelwedge in R shoe; no difference noted in pattern, no recurvatum in R knee yet-pt wants to trial Bioness.      Knee/Hip Exercises: Aerobic   Stepper Concurrent with Bioness in training mode:  Elliptical level 2; forward x2 min, Verbal and tactile Cues provided to prevent R knee recurvatum      Knee/Hip Exercises: Standing   Functional Squat 2 sets;10 reps;Limitations    Functional Squat Limitations 2nd set standing on Airex; Concurrent with Bioness in training mode      Modalities   Modalities Teacher, English as a foreign language Location Bioness to R hamstrings (thigh cuff; lower cuff not on)    Electrical Stimulation Action for increased muscle activation, proprioception    Electrical Stimulation Parameters Refer to Bioness tablet 1 for parameters    Electrical Stimulation Goals Strength;Neuromuscular facilitation;Other (comment)   proprioception all to decrease recurvatum with R knee with fatigue                   PT Education - 01/09/21 1554  Education Details Bioness rationale and potential benefits in therapy sessions    Person(s) Educated Patient    Methods Explanation    Comprehension Verbalized understanding              PT Short Term Goals - 12/25/20 1202       PT SHORT TERM GOAL #1   Title Pt will be independent with HEP for improved strength, balance, and gait.  TARGET 12/26/2020    Baseline Performing, but needs min reminder cues    Time 5    Period Weeks    Status Partially Met      PT SHORT TERM GOAL #2   Title Pt will perform 5x sit<>stand in less than or equal to 14 sec for improved transfer efficiency and functional strength.    Baseline 17.09> 14.09 sec 12/25/20    Time 5    Period Weeks    Status Achieved      PT SHORT TERM GOAL #3   Title Pt will improve single limb  stance on RLE to at least 10 sec for improved balance, functional strength.    Baseline 3.5 sec; 10 sec, 3 reps 12/25/20    Time 5    Period Weeks    Status Achieved      PT SHORT TERM GOAL #4   Title Pt/family will verbalize understanding of fall prevention in home environment.    Time 5    Period Weeks    Status Achieved      PT SHORT TERM GOAL #5   Title 6 minute walk test to improve to at least 1700 ft for improved gait efficiency over longer distances.    Baseline 1589 ft> 1684 ft 12/25/20    Time 5    Period Weeks    Status Not Met               PT Long Term Goals - 11/24/20 1518       PT LONG TERM GOAL #1   Title Pt will be independent with progression of HEP for improved strength, balance, and gait.  TARGET 01/23/2021    Time 9    Period Weeks    Status New      PT LONG TERM GOAL #2   Title Pt will improve 5x sit<>stand to less than or equal to 12 sec to demonstrate improved functional strength and transfer efficiency.    Time 9    Period Weeks    Status New      PT LONG TERM GOAL #3   Title Pt will improve FGA score to at least 24/30 to decrease fall risk.    Baseline 19/30    Time 9    Period Weeks    Status New      PT LONG TERM GOAL #4   Title Pt will improve gait velocity to at least 4 ft/sec for improved gait efficiency and safety.    Baseline 3.28 ft/sec (pt reports always being a fast walker)    Time 9    Period Weeks    Status New      PT LONG TERM GOAL #5   Title Pt will ambulate at least 1000 ft, indoor and outdoor surfaces, no LOB, independently, for improved community gait.    Time 9    Period Weeks    Status New      Additional Long Term Goals   Additional Long Term Goals Yes      PT LONG TERM GOAL #  6   Title Pt will improve FOTO to at least 81% (predicted FOTO at d/c) to demo improved overall functional mobility.    Baseline 68%    Time 9    Period Weeks    Status New                   Plan - 01/09/21 1554      Clinical Impression Statement Trialed Bioness for R hamstring cuff stimulation today, to help with facilitation of R hamstrings/NMR/proprioception to decrease R knee recurvatum (which tends to come on with fatigue).  Pt responds well to stimulation and tolerates through gait and standing exercises/elliptical machine in session.  By end of sesison on elliptical, she does display some R knee recurvatum.  She did not appear to have any skin irritation or adverse effects/complaints. She will benefit from continued trial of Bioness for NMR to R hamstrings for improved strength, timing of gait, working towards [place].    Personal Factors and Comorbidities Other   No pertient PMH; severity of deficits, including aphasia   Examination-Activity Limitations Locomotion Level;Transfers;Stairs    Examination-Participation Restrictions Occupation;Community Activity;Driving    Stability/Clinical Decision Making Evolving/Moderate complexity    Rehab Potential Good    PT Frequency 2x / week   2x/wk x 8 weeks   PT Duration Other (comment)   plus 1x/wk on eval week; 9 week POC   PT Treatment/Interventions ADLs/Self Care Home Management;Gait training;Stair training;Functional mobility training;Therapeutic activities;Therapeutic exercise;Balance training;Neuromuscular re-education;Electrical Stimulation;Patient/family education;Manual techniques;Passive range of motion    PT Next Visit Plan Trial Bioness if pt brings shorts for lower cuff and on quads/hamstrings to assist with knee control. Continue to progress HEP-theraband/weights for RLE strengthening; core stability, balance on compliant surfaces vision removed.  Tall kneeling activities for RLE NMR.  Cues to avoid recurvatum with R knee.    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 01/09/2021, 3:59 PM Frazier Butt., PT  [facility] [address] [place], [place], [address] Phone: [phone]   Fax:  [phone]  Name: BRE PECINA MRN: [id number] Date of Birth: 20-May-1996

## 2021-01-12 ENCOUNTER — Telehealth (HOSPITAL_COMMUNITY): Payer: Self-pay

## 2021-01-12 NOTE — Telephone Encounter (Signed)
Called pt to inform her about ct scan. I have faxed an order to [facility] to have scheduled. They will give her a call to schedule. Once the exam has been done, I will get imaging sent to [place] and have Dr. Corliss Skains to review. AW

## 2021-01-13 ENCOUNTER — Ambulatory Visit: Payer: No Typology Code available for payment source

## 2021-01-13 ENCOUNTER — Ambulatory Visit: Payer: No Typology Code available for payment source | Admitting: Occupational Therapy

## 2021-01-13 ENCOUNTER — Other Ambulatory Visit: Payer: Self-pay

## 2021-01-13 ENCOUNTER — Encounter: Payer: Self-pay | Admitting: Physical Therapy

## 2021-01-13 ENCOUNTER — Ambulatory Visit: Payer: No Typology Code available for payment source | Admitting: Physical Therapy

## 2021-01-13 DIAGNOSIS — R208 Other disturbances of skin sensation: Secondary | ICD-10-CM | POA: Diagnosis not present

## 2021-01-13 DIAGNOSIS — I69151 Hemiplegia and hemiparesis following nontraumatic intracerebral hemorrhage affecting right dominant side: Secondary | ICD-10-CM

## 2021-01-13 DIAGNOSIS — R2689 Other abnormalities of gait and mobility: Secondary | ICD-10-CM

## 2021-01-13 DIAGNOSIS — R4701 Aphasia: Secondary | ICD-10-CM | POA: Diagnosis not present

## 2021-01-13 DIAGNOSIS — R278 Other lack of coordination: Secondary | ICD-10-CM

## 2021-01-13 DIAGNOSIS — R41840 Attention and concentration deficit: Secondary | ICD-10-CM | POA: Diagnosis not present

## 2021-01-13 DIAGNOSIS — M6281 Muscle weakness (generalized): Secondary | ICD-10-CM

## 2021-01-13 DIAGNOSIS — R41844 Frontal lobe and executive function deficit: Secondary | ICD-10-CM | POA: Diagnosis not present

## 2021-01-13 DIAGNOSIS — R2681 Unsteadiness on feet: Secondary | ICD-10-CM | POA: Diagnosis not present

## 2021-01-13 NOTE — Therapy (Signed)
[facility] [address] [place], [place], [address] Phone: [phone]   Fax:  [phone]  Occupational Therapy Treatment  Patient Details  Name: Jane Martin MRN: [id number] Date of Birth: [birth year] Referring Provider (OT): f/u w Ihor Austin, NP   Encounter Date: 01/13/2021   OT End of Session - 01/13/21 0935     Visit Number 13    Number of Visits 25    Date for OT Re-Evaluation 02/16/21    Authorization Type [facility] UMR    Authorization Time Period VL:MN    OT Start Time 0934    OT Stop Time 1013    OT Time Calculation (min) 39 min             Past Medical History:  Diagnosis Date   Stroke (HCC) 10/07/2020    Past Surgical History:  Procedure Laterality Date   CRANIOTOMY Left 10/17/2020   Procedure: CRANIOTOMY HEMATOMA Evacuation;  Surgeon: Barnett Abu, MD;  Location: [facility];  Service: Neurosurgery;  Laterality: Left;   IR ANGIO INTRA EXTRACRAN SEL COM CAROTID INNOMINATE BILAT MOD SED  10/29/2020   IR ANGIO VERTEBRAL SEL VERTEBRAL BILAT MOD SED  10/29/2020   IR US GUIDE VASC ACCESS RIGHT  10/29/2020    There were no vitals filed for this visit.   Subjective Assessment - 01/13/21 0934     Subjective  "still numb"    Limitations Aphasia (expressive and receptive), Absent sensation on R hemi    Patient Stated Goals "to be able to feel myself, do for myself, drive"    Currently in Pain? No/denies               Treatment: Functional reach to copy small peg design, min-mod drops/ difficulty, increased time required. Cat and cow positions for weightbearing, bird dog for lifting alternate UE/ LE with min-mod facilitation for weightbearing and rocking/ forwards and backwards min v.c. Arm bike x 6 mins level 5 for reciprocal movement                  OT Education - 01/13/21 0950     Education Details red theraband for shoulder abduction, shoulder flexion, shoulder extension,  biceps curls and triceps extension 10-15 reps each, min v.c for positioning, pt reports this was previously issued    Person(s) Educated Patient    Methods Explanation;Verbal cues;Handout;Demonstration    Comprehension Verbalized understanding;Returned demonstration;Verbal cues required              OT Short Term Goals - 01/07/21 1033       OT SHORT TERM GOAL #1   Title Pt will be independent with HEP for RUE coordination and grip strength    Time 4    Period Weeks    Status Achieved   coordination HEP, theraputty red 12/22/20   Target Date 12/22/20      OT SHORT TERM GOAL #2   Title Pt will verbalize understanding of sensory strategies for increased safety awareness    Time 4    Period Weeks    Status On-going   reviewed sensory strategies for safety, still needing cues 12/22/20     OT SHORT TERM GOAL #3   Title Pt will perform environmental scanning in moderately distracting environment with 90% accuracy or greater.    Time 4    Period Weeks    Status Achieved   93% 01/07/21     OT SHORT TERM GOAL #4   Title Pt will  improve box and blocks score by 4 blocks or more with RUE for increase in overall functional use of RUE.    Baseline R 21 L 51    Time 4    Period Weeks    Status Achieved   RUE 30 blocks 12/22/20     OT SHORT TERM GOAL #5   Title Pt will perform simple warm meal prep with supervision and good safety awareness    Time 4    Period Weeks    Status Achieved   supervision 12/22/20              OT Long Term Goals - 01/07/21 1053       OT LONG TERM GOAL #1   Title Pt will be independent with updated HEPs    Time 12    Period Weeks    Status New      OT LONG TERM GOAL #2   Title Pt will increase grip strength in RUE by 8 lbs or greater for increasein functional use of RUE, dominant, hand.    Baseline R 24.6, L 42.1    Time 12    Period Weeks    Status New      OT LONG TERM GOAL #3   Title Pt will complete 9 hole peg test in 2 minutes or less with  RUE for increase in fine motor coordination    Baseline R 8 pegs in 2 min, 30 seconds L 24.63s    Time 12    Period Weeks    Status New      OT LONG TERM GOAL #4   Title Pt will complete physical and cognitive task simultaneously with 90% accuracy or greater    Time 12    Period Weeks    Status New      OT LONG TERM GOAL #5   Title Pt will demonstrate or report increased ease with styling her own hair with bilateral UE.    Time 12    Period Weeks    Status New      OT LONG TERM GOAL #6   Title Pt and caregiver will verbalize undersatnding for return to driving and return to work recommendations.    Time 12    Period Weeks    Status On-going      OT LONG TERM GOAL #7   Title Pt will score a 57 or greater on FOTO at discharge.    Baseline 38    Time 12    Period Weeks    Status New                   Plan - 01/13/21 0947     Clinical Impression Statement Pt is progresssing towards goals, she remains limited by sensory deficits.    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 ADL;Decreased knowledge of use of DME;Strength;Dexterity;GMC;Proprioception;UE functional use;ROM;IADL;Vision;Decreased knowledge of precautions;FMC;Sensation;Coordination    Cognitive Skills Problem Solve;Understand;Perception;Thought;Sequencing    Rehab Potential Good    OT Frequency 2x / week    OT Duration 12 weeks    OT Treatment/Interventions Self-care/ADL training;Fluidtherapy;DME and/or AE instruction;Therapeutic activities;Cognitive remediation/compensation;Visual/perceptual remediation/compensation;Patient/family education;Passive range of motion;Manual Therapy;Energy conservation;Electrical Stimulation;Neuromuscular education;Marine scientist;Therapeutic exercise    Plan quadruped for weight bearing/proprioception RUE, handwriting, functional use of RUE  Consulted and Agree with Plan of Care Patient             Patient will benefit from skilled therapeutic intervention in order to improve the following deficits and impairments:   Body Structure / Function / Physical Skills: ADL, Decreased knowledge of use of DME, Strength, Dexterity, GMC, Proprioception, UE functional use, ROM, IADL, Vision, Decreased knowledge of precautions, FMC, Sensation, Coordination Cognitive Skills: Problem Solve, Understand, Perception, Thought, Sequencing     Visit Diagnosis: Muscle weakness (generalized)  Other abnormalities of gait and mobility  Other lack of coordination  Attention and concentration deficit  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)  Other disturbances of skin sensation  Frontal lobe and executive function deficit    Problem List Patient Active Problem List   Diagnosis Date Noted   Acute blood loss anemia 11/10/2020   Abnormal LFTs 11/10/2020   Aphasia 10/30/2020   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

## 2021-01-13 NOTE — Therapy (Signed)
[facility] [address] [place], [place], [address] Phone: [phone]   Fax:  [phone]  Physical Therapy Treatment  Patient Details  Name: Jane Martin MRN: [id number] Date of Birth: 11/28/1995 Referring Provider (PT): Marlowe Shores, PA-C (to see Raulkar)   Encounter Date: 01/13/2021   PT End of Session - 01/13/21 0855     Visit Number 10    Number of Visits 18    Authorization Type UMR/Medcost    PT Start Time 2919   Pt arrives late   PT Stop Time 0931    PT Time Calculation (min) 38 min    Activity Tolerance Patient tolerated treatment well    Behavior During Therapy [facility] for tasks assessed/performed             Past Medical History:  Diagnosis Date   Stroke ([place]) 10/07/2020    Past Surgical History:  Procedure Laterality Date   CRANIOTOMY Left 10/17/2020   Procedure: CRANIOTOMY HEMATOMA Evacuation;  Surgeon: Kristeen Miss, MD;  Location: [facility];  Service: Neurosurgery;  Laterality: Left;   IR ANGIO INTRA EXTRACRAN SEL COM CAROTID INNOMINATE BILAT MOD SED  10/29/2020   IR ANGIO VERTEBRAL SEL VERTEBRAL BILAT MOD SED  10/29/2020   IR US GUIDE VASC ACCESS RIGHT  10/29/2020    There were no vitals filed for this visit.   Subjective Assessment - 01/13/21 0855     Subjective I'm still numb.  I can feel the sensation of the Bioness, even a little after we were done.    Pertinent History No significant PMH    Patient Stated Goals Pt's goal for therapy are for RLE to stop being numb and get back to normal.    Currently in Pain? No/denies                               [facility] Adult PT Treatment/Exercise - 01/13/21 0001       Ambulation/Gait   Ambulation/Gait Yes    Ambulation/Gait Assistance 7: Independent    Ambulation Distance (Feet) 1400 Feet   Concurrent with Bioness L300 to R hamstrings   Assistive device None    Gait Pattern Step-through pattern;Right genu recurvatum     Ambulation Surface Level;Indoor    Gait Comments Concurrent with Bioness L300 to R hamstrings.  Environmental scanning, varied speeds, forward/back walking      Exercises   Exercises Ankle;Knee/Hip      Knee/Hip Exercises: Standing   Forward Lunges Right;Left;Limitations    Forward Lunges Limitations Forward 20 ft, x 2, coordinated with Bioness L300, on time 4 sec, off time 4 sec.    Functional Squat 2 sets;10 reps;Limitations    Functional Squat Limitations 2nd set standing on Airex; Concurrent with Bioness in training mode    Other Standing Knee Exercises Sidestep squats 15 ft to L and to R, concurrent with Bioness L300 in training mode; on time decr to 4 seconds, off time to 6 seconds.  Cues for coordination of squats with stimulation      Electrical Stimulation   Electrical Stimulation Location Bioness to R hamstrings (thigh cuff; lower cuff not on)    Electrical Stimulation Action for increased muscle contraction, proprioception    Electrical Stimulation Parameters Refer to [employer] Tablet 1 for parameters    Electrical Stimulation Goals Strength;Neuromuscular facilitation;Other (comment)   proprioception to decrease R knee recurvatum  PT Short Term Goals - 12/25/20 1202       PT SHORT TERM GOAL #1   Title Pt will be independent with HEP for improved strength, balance, and gait.  TARGET 12/26/2020    Baseline Performing, but needs min reminder cues    Time 5    Period Weeks    Status Partially Met      PT SHORT TERM GOAL #2   Title Pt will perform 5x sit<>stand in less than or equal to 14 sec for improved transfer efficiency and functional strength.    Baseline 17.09> 14.09 sec 12/25/20    Time 5    Period Weeks    Status Achieved      PT SHORT TERM GOAL #3   Title Pt will improve single limb stance on RLE to at least 10 sec for improved balance, functional strength.    Baseline 3.5 sec; 10 sec, 3 reps 12/25/20    Time 5    Period Weeks     Status Achieved      PT SHORT TERM GOAL #4   Title Pt/family will verbalize understanding of fall prevention in home environment.    Time 5    Period Weeks    Status Achieved      PT SHORT TERM GOAL #5   Title 6 minute walk test to improve to at least 1700 ft for improved gait efficiency over longer distances.    Baseline 1589 ft> 1684 ft 12/25/20    Time 5    Period Weeks    Status Not Met               PT Long Term Goals - 11/24/20 1518       PT LONG TERM GOAL #1   Title Pt will be independent with progression of HEP for improved strength, balance, and gait.  TARGET 01/23/2021    Time 9    Period Weeks    Status New      PT LONG TERM GOAL #2   Title Pt will improve 5x sit<>stand to less than or equal to 12 sec to demonstrate improved functional strength and transfer efficiency.    Time 9    Period Weeks    Status New      PT LONG TERM GOAL #3   Title Pt will improve FGA score to at least 24/30 to decrease fall risk.    Baseline 19/30    Time 9    Period Weeks    Status New      PT LONG TERM GOAL #4   Title Pt will improve gait velocity to at least 4 ft/sec for improved gait efficiency and safety.    Baseline 3.28 ft/sec (pt reports always being a fast walker)    Time 9    Period Weeks    Status New      PT LONG TERM GOAL #5   Title Pt will ambulate at least 1000 ft, indoor and outdoor surfaces, no LOB, independently, for improved community gait.    Time 9    Period Weeks    Status New      Additional Long Term Goals   Additional Long Term Goals Yes      PT LONG TERM GOAL #6   Title Pt will improve FOTO to at least 81% (predicted FOTO at d/c) to demo improved overall functional mobility.    Baseline 68%    Time 9    Period Weeks  Status New                   Plan - 01/13/21 1153     Clinical Impression Statement Continued trial of Bioness with gait and with strengthening of hamstring activities, too assist with strengthening,  NMR/proprioception through RLE.  Pt reports no adverse affects from Bioness last visit, but does note improved feeling of sensation/almost a feeling of heaviness through RLE.  No recurvatum noted with gait activities today.  She continues to benefit from skilled PT to address strength, timing/coordination with gait pattern.    Personal Factors and Comorbidities Other   No pertient PMH; severity of deficits, including aphasia   Examination-Activity Limitations Locomotion Level;Transfers;Stairs    Examination-Participation Restrictions Occupation;Community Activity;Driving    Stability/Clinical Decision Making Evolving/Moderate complexity    Rehab Potential Good    PT Frequency 2x / week   2x/wk x 8 weeks   PT Duration Other (comment)   plus 1x/wk on eval week; 9 week POC   PT Treatment/Interventions ADLs/Self Care Home Management;Gait training;Stair training;Functional mobility training;Therapeutic activities;Therapeutic exercise;Balance training;Neuromuscular re-education;Electrical Stimulation;Patient/family education;Manual techniques;Passive range of motion    PT Next Visit Plan Continue to work on Bioness if pt brings shorts for lower cuff and on hamstrings to assist with knee control. Continue to progress HEP-theraband/weights for RLE strengthening; core stability, balance on compliant surfaces vision removed.  Tall kneeling activities for RLE NMR.  Cues to avoid recurvatum with R knee.  Next week is time to check LTGs and decide recert vs discharge    PT Home Exercise Plan Access Code: [email or web address]    Consulted and Agree with Plan of Care Patient             Patient will benefit from skilled therapeutic intervention in order to improve the following deficits and impairments:  Abnormal gait, Difficulty walking, Decreased balance, Impaired sensation, Decreased strength, Decreased mobility  Visit Diagnosis: Other abnormalities of gait and mobility  Muscle weakness  (generalized)     Problem List Patient Active Problem List   Diagnosis Date Noted   Acute blood loss anemia 11/10/2020   Abnormal LFTs 11/10/2020   Aphasia 10/30/2020   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 01/13/2021, 11:59 AM Frazier Butt., PT   [facility] [address] [place], [place], [address] Phone: [phone]   Fax:  [phone]  Name: DENNIE VECCHIO MRN: [id number] Date of Birth: 01-May-1996

## 2021-01-13 NOTE — Therapy (Signed)
[facility] [address] [place], [place], [address] Phone: [phone]   Fax:  [phone]  Speech Language Pathology Treatment  Patient Details  Name: Jane Martin MRN: [id number] Date of Birth: September 08, 1995 Referring Provider (SLP): Dr. Sula Soda   Encounter Date: 01/13/2021   End of Session - 01/13/21 1004     Visit Number 13    Number of Visits 25    Date for SLP Re-Evaluation 02/16/21    Authorization Type no auth; 130 VL PT/OT/ST combined    SLP Start Time 1015    SLP Stop Time  1100    SLP Time Calculation (min) 45 min    Activity Tolerance Patient tolerated treatment well             Past Medical History:  Diagnosis Date   Stroke (HCC) 10/07/2020    Past Surgical History:  Procedure Laterality Date   CRANIOTOMY Left 10/17/2020   Procedure: CRANIOTOMY HEMATOMA Evacuation;  Surgeon: Barnett Abu, MD;  Location: [facility] OR;  Service: Neurosurgery;  Laterality: Left;   IR ANGIO INTRA EXTRACRAN SEL COM CAROTID INNOMINATE BILAT MOD SED  10/29/2020   IR ANGIO VERTEBRAL SEL VERTEBRAL BILAT MOD SED  10/29/2020   IR US GUIDE VASC ACCESS RIGHT  10/29/2020    There were no vitals filed for this visit.   Subjective Assessment - 01/13/21 1013     Subjective "reading"    Currently in Pain? No/denies                   ADULT SLP TREATMENT - 01/13/21 1003       General Information   Behavior/Cognition Alert;Cooperative;Pleasant mood      Treatment Provided   Treatment provided Cognitive-Linquistic      Cognitive-Linquistic Treatment   Treatment focused on Aphasia;Patient/family/caregiver education    Skilled Treatment SLP targeted high level divergent naming, in which pt named 6 items in 4 categories with 60% accuracy independently. Occasional min to mod semantic and phonemic cues required to aid naming due to anomia and phonemic paraphasias. Pt wrote single words answers with rare verbal  cues for spelling. Pt indicates improvements in spelling.      Assessment / Recommendations / Plan   Plan Continue with current plan of care      Progression Toward Goals   Progression toward goals Progressing toward goals              SLP Education - 01/13/21 1151     Education Details HEP    Person(s) Educated Patient    Methods Explanation;Demonstration;Handout    Comprehension Verbalized understanding;Returned demonstration;Need further instruction              SLP Short Term Goals - 12/31/20 1120       SLP SHORT TERM GOAL #1   Title Pt will name 10 items in [place] relevant category with rare min A over 2 sessions    Baseline 12-22-20, 12-31-20    Time --    Period --    Status Achieved      SLP SHORT TERM GOAL #2   Title Pt will utilize 2 compensatory strategies for aphasia in structured language tasks 8/10 opportunities with occasional min A over 2 sessions    Baseline 12-22-20; 12/29/20    Status Achieved      SLP SHORT TERM GOAL #3   Title Pt will comprehend 3 sentence paragraph with rare min A over 2 sessions    Baseline  12-25-20, 12/29/20    Status Achieved      SLP SHORT TERM GOAL #4   Title Pt will write at sentence level, IDing errors with occasional min 8/10 errors    Baseline 12-15-20, 12-31-20    Time --    Period --    Status Achieved      SLP SHORT TERM GOAL #5   Title Pt will carryover compensations for aphasia during 12 minute simple conversation as needed with occasional min A    Baseline 12-31-20    Time --    Period --    Status Achieved              SLP Long Term Goals - 01/13/21 1004       SLP LONG TERM GOAL #1   Title Pt will complete complex naming tasks with 90% accuracy and occasional min A over 2 sessions    Time 5    Period Weeks    Status On-going      SLP LONG TERM GOAL #2   Title Pt will comprehend 3 paragraph passages of interest to her, restating 2 salient details from each paragraph with occasional min A     Time 5    Period Weeks    Status On-going      SLP LONG TERM GOAL #3   Title Pt will ID and correct errors on texts, e mails and other 4 sentence written communications 9/10 with occasional min A    Time 5    Period Weeks    Status On-going      SLP LONG TERM GOAL #4   Title Pt will carryover compensations for aphasia as needed over 20 minute moderately complex conversation with occasional min A over 2 sessions    Baseline 01-07-21    Time 5    Period Weeks    Status On-going      SLP LONG TERM GOAL #5   Title Pt will improve score on The Communicative Participation Item Bank by 4 points    Time 5    Period Weeks    Status On-going              Plan - 01/13/21 1004     Clinical Impression Statement Jane Martin is referred to outpt ST s/p Left ICH due to aphasia. SLP targeted high level word finding and writing this session, with occasional min to mod A required to aid anomia and rare verbal cues for spelling single words. At this time, [name] can not return to her previous job due to aphasia. I recommend skilled ST to maximize communication for safety, effective and efficient participation in conversations and for possible return to the workforce.    Speech Therapy Frequency 2x / week    Duration 12 weeks   25 visits   Treatment/Interventions SLP instruction and feedback;Environmental controls;Language facilitation;Cueing hierarchy;Compensatory strategies;Functional tasks;Cognitive reorganization;Compensatory techniques;Patient/family education;Multimodal communcation approach;Internal/external aids    Potential to Achieve Goals Good    SLP Home Exercise Plan provided    Consulted and Agree with Plan of Care Patient             Patient will benefit from skilled therapeutic intervention in order to improve the following deficits and impairments:   Aphasia    Problem List Patient Active Problem List   Diagnosis Date Noted   Acute blood loss anemia 11/10/2020    Abnormal LFTs 11/10/2020   Aphasia 10/30/2020   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 [place], [place], [address] Phone: [phone]   Fax:  [phone]   Name: Jane Martin MRN: [id number] Date of Birth: 01/25/1996

## 2021-01-15 ENCOUNTER — Encounter: Payer: Self-pay | Admitting: Physical Therapy

## 2021-01-15 ENCOUNTER — Encounter: Payer: Self-pay | Admitting: Occupational Therapy

## 2021-01-15 ENCOUNTER — Ambulatory Visit: Payer: No Typology Code available for payment source | Admitting: Physical Therapy

## 2021-01-15 ENCOUNTER — Other Ambulatory Visit: Payer: Self-pay

## 2021-01-15 ENCOUNTER — Ambulatory Visit: Payer: No Typology Code available for payment source | Admitting: Occupational Therapy

## 2021-01-15 DIAGNOSIS — R278 Other lack of coordination: Secondary | ICD-10-CM | POA: Diagnosis not present

## 2021-01-15 DIAGNOSIS — R41840 Attention and concentration deficit: Secondary | ICD-10-CM | POA: Diagnosis not present

## 2021-01-15 DIAGNOSIS — R4701 Aphasia: Secondary | ICD-10-CM | POA: Diagnosis not present

## 2021-01-15 DIAGNOSIS — R2689 Other abnormalities of gait and mobility: Secondary | ICD-10-CM | POA: Diagnosis not present

## 2021-01-15 DIAGNOSIS — I69151 Hemiplegia and hemiparesis following nontraumatic intracerebral hemorrhage affecting right dominant side: Secondary | ICD-10-CM

## 2021-01-15 DIAGNOSIS — R41844 Frontal lobe and executive function deficit: Secondary | ICD-10-CM

## 2021-01-15 DIAGNOSIS — R2681 Unsteadiness on feet: Secondary | ICD-10-CM | POA: Diagnosis not present

## 2021-01-15 DIAGNOSIS — M6281 Muscle weakness (generalized): Secondary | ICD-10-CM | POA: Diagnosis not present

## 2021-01-15 DIAGNOSIS — R208 Other disturbances of skin sensation: Secondary | ICD-10-CM

## 2021-01-15 NOTE — Therapy (Signed)
[facility] [address] [place], [place], [address] Phone: [phone]   Fax:  [phone]  Occupational Therapy Treatment  Patient Details  Name: Jane Martin MRN: [id number] Date of Birth: [birth year] Referring Provider (OT): f/u w Ihor Austin, NP   Encounter Date: 01/15/2021   OT End of Session - 01/15/21 1012     Visit Number 14    Number of Visits 25    Date for OT Re-Evaluation 02/16/21    Authorization Type [facility] UMR    Authorization Time Period VL:MN    OT Start Time 0737    OT Stop Time 1016    OT Time Calculation (min) 39 min    Activity Tolerance Patient tolerated treatment well    Behavior During Therapy [facility] for tasks assessed/performed             Past Medical History:  Diagnosis Date   Stroke (HCC) 10/07/2020    Past Surgical History:  Procedure Laterality Date   CRANIOTOMY Left 10/17/2020   Procedure: CRANIOTOMY HEMATOMA Evacuation;  Surgeon: Barnett Abu, MD;  Location: [facility] OR;  Service: Neurosurgery;  Laterality: Left;   IR ANGIO INTRA EXTRACRAN SEL COM CAROTID INNOMINATE BILAT MOD SED  10/29/2020   IR ANGIO VERTEBRAL SEL VERTEBRAL BILAT MOD SED  10/29/2020   IR US GUIDE VASC ACCESS RIGHT  10/29/2020    There were no vitals filed for this visit.   Subjective Assessment - 01/15/21 1006     Subjective  "still numb"   Pt reports that she was able to pull hair into ponytail.    Pertinent History None    Limitations Aphasia (expressive and receptive), Absent sensation on R hemi    Patient Stated Goals "to be able to feel myself, do for myself, drive"    Currently in Pain? No/denies               Quadruped for incr scapular/core stability and neuro re-ed:  forward/backward wt. Shifts, cat/cow, alternating UE/LE lifts with min facilitation/mod difficulty.   Trial of strategies for writing:  "the pencil grip" to incr stability due to decr sensation (weighted with 2 large nuts) and  cued to support forearm on table.  Pt demo improved control with forearm on table and the pencil grip (issued for home).   Practiced writing first and last name, tracing therapist written highlighted name to work on decr size and incr control, distal finger control writing with min cueing.  Placing Clothespins on edge of box while varying amount of resistance to challenge pt's ability to grade with decr sensation with min difficulty, but improved with repetition.            OT Short Term Goals - 01/07/21 1033       OT SHORT TERM GOAL #1   Title Pt will be independent with HEP for RUE coordination and grip strength    Time 4    Period Weeks    Status Achieved   coordination HEP, theraputty red 12/22/20   Target Date 12/22/20      OT SHORT TERM GOAL #2   Title Pt will verbalize understanding of sensory strategies for increased safety awareness    Time 4    Period Weeks    Status On-going   reviewed sensory strategies for safety, still needing cues 12/22/20     OT SHORT TERM GOAL #3   Title Pt will perform environmental scanning in moderately distracting environment with 90% accuracy or greater.  Time 4    Period Weeks    Status Achieved   93% 01/07/21     OT SHORT TERM GOAL #4   Title Pt will improve box and blocks score by 4 blocks or more with RUE for increase in overall functional use of RUE.    Baseline R 21 L 51    Time 4    Period Weeks    Status Achieved   RUE 30 blocks 12/22/20     OT SHORT TERM GOAL #5   Title Pt will perform simple warm meal prep with supervision and good safety awareness    Time 4    Period Weeks    Status Achieved   supervision 12/22/20              OT Long Term Goals - 01/07/21 1053       OT LONG TERM GOAL #1   Title Pt will be independent with updated HEPs    Time 12    Period Weeks    Status New      OT LONG TERM GOAL #2   Title Pt will increase grip strength in RUE by 8 lbs or greater for increasein functional use of RUE,  dominant, hand.    Baseline R 24.6, L 42.1    Time 12    Period Weeks    Status New      OT LONG TERM GOAL #3   Title Pt will complete 9 hole peg test in 2 minutes or less with RUE for increase in fine motor coordination    Baseline R 8 pegs in 2 min, 30 seconds L 24.63s    Time 12    Period Weeks    Status New      OT LONG TERM GOAL #4   Title Pt will complete physical and cognitive task simultaneously with 90% accuracy or greater    Time 12    Period Weeks    Status New      OT LONG TERM GOAL #5   Title Pt will demonstrate or report increased ease with styling her own hair with bilateral UE.    Time 12    Period Weeks    Status New      OT LONG TERM GOAL #6   Title Pt and caregiver will verbalize undersatnding for return to driving and return to work recommendations.    Time 12    Period Weeks    Status On-going      OT LONG TERM GOAL #7   Title Pt will score a 57 or greater on FOTO at discharge.    Baseline 38    Time 12    Period Weeks    Status New                   Plan - 01/15/21 1014     Clinical Impression Statement Pt is progresssing towards goals, she remains limited by sensory deficits.  Pt demo improved writing after tracing name today and then was able to carry over.  Pt also appeared to demo incr control with "the pencil grip" and supporting forearm on table.    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 ADL;Decreased knowledge of use of DME;Strength;Dexterity;GMC;Proprioception;UE functional use;ROM;IADL;Vision;Decreased knowledge of precautions;FMC;Sensation;Coordination    Cognitive  Skills Problem Solve;Understand;Perception;Thought;Sequencing    Rehab Potential Good    OT Frequency 2x / week    OT Duration 12  weeks    OT Treatment/Interventions Self-care/ADL training;Fluidtherapy;DME and/or AE instruction;Therapeutic activities;Cognitive remediation/compensation;Visual/perceptual remediation/compensation;Patient/family education;Passive range of motion;Manual Therapy;Energy conservation;Electrical Stimulation;Neuromuscular education;Building services engineer;Therapeutic exercise    Plan quadruped for weight bearing/proprioception RUE, handwriting, functional use of RUE    Consulted and Agree with Plan of Care Patient             Patient will benefit from skilled therapeutic intervention in order to improve the following deficits and impairments:   Body Structure / Function / Physical Skills: ADL, Decreased knowledge of use of DME, Strength, Dexterity, GMC, Proprioception, UE functional use, ROM, IADL, Vision, Decreased knowledge of precautions, FMC, Sensation, Coordination Cognitive Skills: Problem Solve, Understand, Perception, Thought, Sequencing     Visit Diagnosis: 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)  Attention and concentration deficit  Other lack of coordination  Other disturbances of skin sensation  Frontal lobe and executive function deficit    Problem List Patient Active Problem List   Diagnosis Date Noted   Acute blood loss anemia 11/10/2020   Abnormal LFTs 11/10/2020   Aphasia 10/30/2020   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 [place],  [place]  [address] [phone] phone [phone] 01/15/21 12:27 PM

## 2021-01-15 NOTE — Therapy (Signed)
[place] [address] [place], [place], [address] Phone: [phone]   Fax:  [phone]  Physical Therapy Treatment  Patient Details  Name: Jane Martin MRN: [id number] Date of Birth: 12/15/1995 Referring Provider (PT): Marlowe Shores, PA-C (to see Raulkar)   Encounter Date: 01/15/2021   PT End of Session - 01/15/21 0908     Visit Number 11    Number of Visits 18    Authorization Type UMR/[employer]    PT Start Time 0906   Pt overslept and arrived late   PT Stop Time 0929    PT Time Calculation (min) 23 min    Activity Tolerance Patient tolerated treatment well    Behavior During Therapy [facility] for tasks assessed/performed             Past Medical History:  Diagnosis Date   Stroke ([place]) 10/07/2020    Past Surgical History:  Procedure Laterality Date   CRANIOTOMY Left 10/17/2020   Procedure: CRANIOTOMY HEMATOMA Evacuation;  Surgeon: Kristeen Miss, MD;  Location: [place];  Service: Neurosurgery;  Laterality: Left;   IR ANGIO INTRA EXTRACRAN SEL COM CAROTID INNOMINATE BILAT MOD SED  10/29/2020   IR ANGIO VERTEBRAL SEL VERTEBRAL BILAT MOD SED  10/29/2020   IR US GUIDE VASC ACCESS RIGHT  10/29/2020    There were no vitals filed for this visit.   Subjective Assessment - 01/15/21 0908     Subjective Feels heavy in the R leg after the Bioness, but overall still numb.    Pertinent History No significant PMH    Patient Stated Goals Pt's goal for therapy are for RLE to stop being numb and get back to normal.    Currently in Pain? No/denies                               [facility] Adult PT Treatment/Exercise - 01/15/21 0001       Knee/Hip Exercises: Standing   Heel Raises Both;2 sets;10 reps    Heel Raises Limitations and toe raises 2 sets x 10    Knee Flexion Strengthening;2 sets;10 reps    Knee Flexion Limitations Attempted with green theraband; pt has difficulty             Access Code:  [email or web address] URL: [email or web address] Date: 01/15/2021 Prepared by: Mady Haagensen  Exercises-Reviewed HEP and pt demo good understanding  Standing Forward Step Taps with Counter Support - 1 x daily - 5 x weekly - 2-3 sets - 10 reps Standard Lunge - 1 x daily - 7 x weekly - 2 sets - 10 reps Side Stepping with Resistance at Ankles - 1 x daily - 7 x weekly - 3 sets - 10 reps Forward and Backward Monster Walk with Resistance at Ankles and Counter Support - 1 x daily - 7 x weekly - 3 sets - 10 reps Standing Anti-Rotation Press with Anchored Resistance - 1 x daily - 7 x weekly - 3 sets - 10 reps       PT Education - 01/15/21 0927     Education Details Addition to HEP    Person(s) Educated Patient    Methods Explanation;Demonstration;Handout    Comprehension Verbalized understanding              PT Short Term Goals - 12/25/20 1202       PT SHORT TERM GOAL #1   Title Pt will be independent with HEP for  improved strength, balance, and gait.  TARGET 12/26/2020    Baseline Performing, but needs min reminder cues    Time 5    Period Weeks    Status Partially Met      PT SHORT TERM GOAL #2   Title Pt will perform 5x sit<>stand in less than or equal to 14 sec for improved transfer efficiency and functional strength.    Baseline 17.09> 14.09 sec 12/25/20    Time 5    Period Weeks    Status Achieved      PT SHORT TERM GOAL #3   Title Pt will improve single limb stance on RLE to at least 10 sec for improved balance, functional strength.    Baseline 3.5 sec; 10 sec, 3 reps 12/25/20    Time 5    Period Weeks    Status Achieved      PT SHORT TERM GOAL #4   Title Pt/family will verbalize understanding of fall prevention in home environment.    Time 5    Period Weeks    Status Achieved      PT SHORT TERM GOAL #5   Title 6 minute walk test to improve to at least 1700 ft for improved gait efficiency over longer distances.    Baseline 1589 ft> 1684 ft 12/25/20    Time 5     Period Weeks    Status Not Met               PT Long Term Goals - 11/24/20 1518       PT LONG TERM GOAL #1   Title Pt will be independent with progression of HEP for improved strength, balance, and gait.  TARGET 01/23/2021    Time 9    Period Weeks    Status New      PT LONG TERM GOAL #2   Title Pt will improve 5x sit<>stand to less than or equal to 12 sec to demonstrate improved functional strength and transfer efficiency.    Time 9    Period Weeks    Status New      PT LONG TERM GOAL #3   Title Pt will improve FGA score to at least 24/30 to decrease fall risk.    Baseline 19/30    Time 9    Period Weeks    Status New      PT LONG TERM GOAL #4   Title Pt will improve gait velocity to at least 4 ft/sec for improved gait efficiency and safety.    Baseline 3.28 ft/sec (pt reports always being a fast walker)    Time 9    Period Weeks    Status New      PT LONG TERM GOAL #5   Title Pt will ambulate at least 1000 ft, indoor and outdoor surfaces, no LOB, independently, for improved community gait.    Time 9    Period Weeks    Status New      Additional Long Term Goals   Additional Long Term Goals Yes      PT LONG TERM GOAL #6   Title Pt will improve FOTO to at least 81% (predicted FOTO at d/c) to demo improved overall functional mobility.    Baseline 68%    Time 9    Period Weeks    Status New                   Plan - 01/15/21 1126  Clinical Impression Statement Did not perform Bioness today, due to pt being late to appointment (overslept).  Used session today to fully review HEP and update HEP.  Added hamstring strengthening and gastroc stretch to HEP.  Pt continued to demonstrate some hamstring weakness, especially with difficulty green theraband resistance.  No recurvatum noted with activiteis today.  Plan to assess goals and decide discharge versus recert next visit.    Personal Factors and Comorbidities Other   No pertient PMH; severity of  deficits, including aphasia   Examination-Activity Limitations Locomotion Level;Transfers;Stairs    Examination-Participation Restrictions Occupation;Community Activity;Driving    Stability/Clinical Decision Making Evolving/Moderate complexity    Rehab Potential Good    PT Frequency 2x / week   2x/wk x 8 weeks   PT Duration Other (comment)   plus 1x/wk on eval week; 9 week POC   PT Treatment/Interventions ADLs/Self Care Home Management;Gait training;Stair training;Functional mobility training;Therapeutic activities;Therapeutic exercise;Balance training;Neuromuscular re-education;Electrical Stimulation;Patient/family education;Manual techniques;Passive range of motion    PT Next Visit Plan Check LTGs and decide recert/discharge. If feel pt is ready for d/c, may need to discuss community/ongoing fitness.  Review HEP.    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 01/15/2021, 11:31 AM Frazier Butt., PT   [place] [address] [place], [place], [address] Phone: [phone]   Fax:  [phone]  Name: Jane Martin MRN: [id number] Date of Birth: 04-14-1996

## 2021-01-15 NOTE — Patient Instructions (Signed)
Access Code: [email or web address] URL: [email or web address] Date: 01/15/2021 Prepared by: Lonia Blood  Exercises Standing Forward Step Taps with Counter Support - 1 x daily - 5 x weekly - 2-3 sets - 10 reps Standard Lunge - 1 x daily - 7 x weekly - 2 sets - 10 reps Side Stepping with Resistance at Ankles - 1 x daily - 7 x weekly - 3 sets - 10 reps Forward and Backward Monster Walk with Resistance at Ankles and Counter Support - 1 x daily - 7 x weekly - 3 sets - 10 reps Standing Anti-Rotation Press with Anchored Resistance - 1 x daily - 7 x weekly - 3 sets - 10 reps  Added 01/15/21 Standing Knee Flexion AROM with Chair Support - 1 x daily - 75 x weekly - 3 sets - 10 reps Standing Bilateral Gastroc Stretch with Step - 1-2 x daily - 7 x weekly - 1 sets - 3 reps - 30 sec hold

## 2021-01-19 ENCOUNTER — Other Ambulatory Visit: Payer: Self-pay

## 2021-01-19 ENCOUNTER — Ambulatory Visit: Payer: No Typology Code available for payment source | Admitting: Physical Therapy

## 2021-01-19 ENCOUNTER — Ambulatory Visit: Payer: No Typology Code available for payment source | Admitting: Occupational Therapy

## 2021-01-19 ENCOUNTER — Encounter: Payer: Self-pay | Admitting: Occupational Therapy

## 2021-01-19 ENCOUNTER — Ambulatory Visit: Payer: No Typology Code available for payment source | Admitting: Speech Pathology

## 2021-01-19 DIAGNOSIS — R41840 Attention and concentration deficit: Secondary | ICD-10-CM | POA: Diagnosis not present

## 2021-01-19 DIAGNOSIS — R41844 Frontal lobe and executive function deficit: Secondary | ICD-10-CM

## 2021-01-19 DIAGNOSIS — I69151 Hemiplegia and hemiparesis following nontraumatic intracerebral hemorrhage affecting right dominant side: Secondary | ICD-10-CM | POA: Diagnosis not present

## 2021-01-19 DIAGNOSIS — M6281 Muscle weakness (generalized): Secondary | ICD-10-CM

## 2021-01-19 DIAGNOSIS — R208 Other disturbances of skin sensation: Secondary | ICD-10-CM

## 2021-01-19 DIAGNOSIS — R4701 Aphasia: Secondary | ICD-10-CM

## 2021-01-19 DIAGNOSIS — R2681 Unsteadiness on feet: Secondary | ICD-10-CM | POA: Diagnosis not present

## 2021-01-19 DIAGNOSIS — R2689 Other abnormalities of gait and mobility: Secondary | ICD-10-CM

## 2021-01-19 DIAGNOSIS — R278 Other lack of coordination: Secondary | ICD-10-CM | POA: Diagnosis not present

## 2021-01-19 NOTE — Therapy (Signed)
[facility] [address] [place], [place], [address] Phone: [phone]   Fax:  [phone]  Occupational Therapy Treatment  Patient Details  Name: Jane Martin MRN: [id number] Date of Birth: [birth year] Referring Provider (OT): f/u w Frann Rider, NP   Encounter Date: 01/19/2021   OT End of Session - 01/19/21 1019     Visit Number 15    Number of Visits 25    Date for OT Re-Evaluation 02/16/21    Authorization Type [facility]    Authorization Time Period VL:MN    OT Start Time 1017    OT Stop Time 1058    OT Time Calculation (min) 41 min    Activity Tolerance Patient tolerated treatment well    Behavior During Therapy [facility] for tasks assessed/performed             Past Medical History:  Diagnosis Date   Stroke ([place]) 10/07/2020    Past Surgical History:  Procedure Laterality Date   CRANIOTOMY Left 10/17/2020   Procedure: CRANIOTOMY HEMATOMA Evacuation;  Surgeon: Kristeen Miss, MD;  Location: [place];  Service: Neurosurgery;  Laterality: Left;   IR ANGIO INTRA EXTRACRAN SEL COM CAROTID INNOMINATE BILAT MOD SED  10/29/2020   IR ANGIO VERTEBRAL SEL VERTEBRAL BILAT MOD SED  10/29/2020   IR US GUIDE VASC ACCESS RIGHT  10/29/2020    There were no vitals filed for this visit.   Subjective Assessment - 01/19/21 1019     Subjective  "still numb" Pt reports "that's about it" re: the numbness.    Pertinent History None    Limitations Aphasia (expressive and receptive), Absent sensation on R hemi    Patient Stated Goals "to be able to feel myself, do for myself, drive"    Currently in Pain? No/denies                [facility] OT Assessment - 01/19/21 0001       Coordination   9 Hole Peg Test Right    Right 9 Hole Peg Test 45.44s      Hand Function   Right Hand Grip (lbs) 42.6                      OT Treatments/Exercises (OP) - 01/19/21 1020       ADLs   Overall ADLs Education provided  regarding benefit of OT. Pt reported only thinking she would benefit from ST at this time d/t numbness but OT educated on benefits of sensory reeducation, coordination and grip strengthening in RUE for increased skills. Pt has made good progress and would continue to benefit. Pt verbalized understanding    Grooming "I can do my hair a little bit and pull it back" pt reports increased ease with doing hair with bilateral UE    Cooking OT recommends continuing to do more cooking. pt reports she has increased doing this    Home Maintenance patient is doing more of the cleaning - cleaning room, laundry and dishes    Writing per pt report, writing is "still trash" and has some dificulty with writing. Showed OT sample from ST session with increased legibility however continues to be fairly illegible.      Exercises   Exercises Hand      Fine Motor Coordination (Hand/Wrist)   Fine Motor Coordination O'Connor pegs;Manipulating coins;Handwriting    Manipulating coins with in hand manipulation for placing a group of pennies in RUE and translating one at  a time to fingertips to place in ice tray. Req'd increased time but with no drops.    Handwriting mazes with RUE first with highligher and than with weighted pen (2 nuts with pencil grip) pt with no deviations with highlighter but with large (greater than 1/2") deviation with weighted pen d/t "not able to see it".    O'Connor pegs with RUE - placing with hand and removing with tweezers for grading resistance/modulation with decreased sensation. pt did well with activity with little difficulty and appropriate time.                      OT Short Term Goals - 01/07/21 1033       OT SHORT TERM GOAL #1   Title Pt will be independent with HEP for RUE coordination and grip strength    Time 4    Period Weeks    Status Achieved   coordination HEP, theraputty red 12/22/20   Target Date 12/22/20      OT SHORT TERM GOAL #2   Title Pt will verbalize  understanding of sensory strategies for increased safety awareness    Time 4    Period Weeks    Status On-going   reviewed sensory strategies for safety, still needing cues 12/22/20     OT SHORT TERM GOAL #3   Title Pt will perform environmental scanning in moderately distracting environment with 90% accuracy or greater.    Time 4    Period Weeks    Status Achieved   93% 01/07/21     OT SHORT TERM GOAL #4   Title Pt will improve box and blocks score by 4 blocks or more with RUE for increase in overall functional use of RUE.    Baseline R 21 L 51    Time 4    Period Weeks    Status Achieved   RUE 30 blocks 12/22/20     OT SHORT TERM GOAL #5   Title Pt will perform simple warm meal prep with supervision and good safety awareness    Time 4    Period Weeks    Status Achieved   supervision 12/22/20              OT Long Term Goals - 01/19/21 1024       OT LONG TERM GOAL #1   Title Pt will be independent with updated HEPs    Time 12    Period Weeks    Status New      OT LONG TERM GOAL #2   Title Pt will increase grip strength in RUE by 8 lbs or greater for increasein functional use of RUE, dominant, hand.    Baseline R 24.6, L 42.1    Time 12    Period Weeks    Status Achieved   42.6 lbs with RUE 01/19/21     OT LONG TERM GOAL #3   Title Pt will complete 9 hole peg test in 2 minutes or less with RUE for increase in fine motor coordination    Baseline R 8 pegs in 2 min, 30 seconds L 24.63s    Time 12    Period Weeks    Status Achieved   01/19/21 RUE 45.44s     OT LONG TERM GOAL #4   Title Pt will complete physical and cognitive task simultaneously with 90% accuracy or greater    Time 12    Period Weeks    Status New  OT LONG TERM GOAL #5   Title Pt will demonstrate or report increased ease with styling her own hair with bilateral UE.    Time 12    Period Weeks    Status Achieved      OT LONG TERM GOAL #6   Title Pt and caregiver will verbalize undersatnding for  return to driving and return to work recommendations.    Time 12    Period Weeks    Status On-going      OT LONG TERM GOAL #7   Title Pt will score a 57 or greater on FOTO at discharge.    Baseline 38    Time 12    Period Weeks    Status New                   Plan - 01/19/21 1037     Clinical Impression Statement Pt has met grip strength and 9 hole peg test/ fine motor coordination goals. Pt would benefit from continued coordination interventions for RUE d/t  sensation deficits.    OT Occupational Profile and History Problem Focused Assessment - Including review of records relating to presenting problem    Occupational performance deficits (Please refer to evaluation for details): IADL's;ADL's;Leisure;Work    Marketing executive / Function / Physical Skills ADL;Decreased knowledge of use of DME;Strength;Dexterity;GMC;Proprioception;UE functional use;ROM;IADL;Vision;Decreased knowledge of precautions;FMC;Sensation;Coordination    Cognitive Skills Problem Solve;Understand;Perception;Thought;Sequencing    Rehab Potential Good    OT Frequency 2x / week    OT Duration 12 weeks    OT Treatment/Interventions Self-care/ADL training;Fluidtherapy;DME and/or AE instruction;Therapeutic activities;Cognitive remediation/compensation;Visual/perceptual remediation/compensation;Patient/family education;Passive range of motion;Manual Therapy;Energy conservation;Electrical Stimulation;Neuromuscular education;Therapist, nutritional;Therapeutic exercise    Plan quadruped for weight bearing/proprioception RUE, handwriting, functional use of RUE    Consulted and Agree with Plan of Care Patient             Patient will benefit from skilled therapeutic intervention in order to improve the following deficits and impairments:   Body Structure / Function / Physical Skills: ADL, Decreased knowledge of use of DME, Strength, Dexterity, GMC, Proprioception, UE functional use, ROM, IADL, Vision,  Decreased knowledge of precautions, FMC, Sensation, Coordination Cognitive Skills: Problem Solve, Understand, Perception, Thought, Sequencing     Visit Diagnosis: Muscle weakness (generalized)  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 [place], [place], [address] Phone: [phone]   Fax:  [phone]  Name: DOYCE STONEHOUSE MRN: [id number] Date of Birth: Nov 10, 1995

## 2021-01-19 NOTE — Therapy (Signed)
[facility] [address] [place], [place], [address] Phone: [phone]   Fax:  [phone]  Speech Language Pathology Treatment  Patient Details  Name: Jane Martin MRN: [id number] Date of Birth: Jun 06, 1996 Referring Provider (SLP): Dr. Sula Soda   Encounter Date: 01/19/2021   End of Session - 01/19/21 1017     Visit Number 14    Number of Visits 25    Date for SLP Re-Evaluation 69/62/95   cert says 2/84/13   Authorization Type no auth; 130 VL PT/OT/ST combined    SLP Start Time 0930    SLP Stop Time  1010    SLP Time Calculation (min) 40 min    Activity Tolerance Patient tolerated treatment well             Past Medical History:  Diagnosis Date   Stroke (HCC) 10/07/2020    Past Surgical History:  Procedure Laterality Date   CRANIOTOMY Left 10/17/2020   Procedure: CRANIOTOMY HEMATOMA Evacuation;  Surgeon: Barnett Abu, MD;  Location: [facility] OR;  Service: Neurosurgery;  Laterality: Left;   IR ANGIO INTRA EXTRACRAN SEL COM CAROTID INNOMINATE BILAT MOD SED  10/29/2020   IR ANGIO VERTEBRAL SEL VERTEBRAL BILAT MOD SED  10/29/2020   IR US GUIDE VASC ACCESS RIGHT  10/29/2020    There were no vitals filed for this visit.   Subjective Assessment - 01/19/21 1000     Subjective "I've been reading all I can"    Currently in Pain? No/denies                   ADULT SLP TREATMENT - 01/19/21 1001       General Information   Behavior/Cognition Alert;Cooperative;Pleasant mood      Treatment Provided   Treatment provided Cognitive-Linquistic      Cognitive-Linquistic Treatment   Treatment focused on Aphasia;Patient/family/caregiver education    Skilled Treatment Readiing comprehension of 4 paragraph passage with lower frequency words [name] ID'd 8 errors reading aloud and self corrected 6/8 with mod I and 2/8 with rare min phonemic cues. She answered 8 detail questions re: passage 8/8 correct  with mod I. Written expression targeted at 2 sentence level - [name] ID'd 6 spelling errors in 8 sentneces with mod I, she required phonmic and letter cues to correct 2/6 error and extended time and mod I to correct 4/6 errors.                SLP Short Term Goals - 01/19/21 1017       SLP SHORT TERM GOAL #1   Title Pt will name 10 items in [place] relevant category with rare min A over 2 sessions    Baseline 12-22-20, 12-31-20    Status Achieved      SLP SHORT TERM GOAL #2   Title Pt will utilize 2 compensatory strategies for aphasia in structured language tasks 8/10 opportunities with occasional min A over 2 sessions    Baseline 12-22-20; 12/29/20    Status Achieved      SLP SHORT TERM GOAL #3   Title Pt will comprehend 3 sentence paragraph with rare min A over 2 sessions    Baseline 12-25-20, 12/29/20    Status Achieved      SLP SHORT TERM GOAL #4   Title Pt will write at sentence level, IDing errors with occasional min 8/10 errors    Baseline 12-15-20, 12-31-20    Status Achieved      SLP  SHORT TERM GOAL #5   Title Pt will carryover compensations for aphasia during 12 minute simple conversation as needed with occasional min A    Baseline 12-31-20    Status Achieved              SLP Long Term Goals - 01/19/21 1017       SLP LONG TERM GOAL #1   Title Pt will complete complex naming tasks with 90% accuracy and occasional min A over 2 sessions    Time 4    Period Weeks    Status On-going      SLP LONG TERM GOAL #2   Title Pt will comprehend 3 paragraph passages of interest to her, restating 2 salient details from each paragraph with occasional min A    Baseline 01/19/21;    Time 4    Period Weeks    Status On-going      SLP LONG TERM GOAL #3   Title Pt will ID and correct errors on texts, e mails and other 4 sentence written communications 9/10 with occasional min A    Time 4    Period Weeks    Status On-going      SLP LONG TERM GOAL #4   Title Pt will  carryover compensations for aphasia as needed over 20 minute moderately complex conversation with occasional min A over 2 sessions    Baseline 01-07-21    Time 4    Period Weeks    Status On-going      SLP LONG TERM GOAL #5   Title Pt will improve score on The Communicative Participation Item Bank by 4 points    Time 5    Period Weeks    Status On-going              Plan - 01/19/21 1013     Clinical Impression Statement [name] continues to make progress reading comprhension and written expression. She is Id'ing and and self correcting aphasic errors in reading and writing with rare min A and extended time. Written expression continues to require significan extended time at 1-2 sentence level.  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    Speech Therapy Frequency 2x / week    Duration 12 weeks   or 25 visits   Treatment/Interventions SLP instruction and feedback;Environmental controls;Language facilitation;Cueing hierarchy;Compensatory strategies;Functional tasks;Cognitive reorganization;Compensatory techniques;Patient/family education;Multimodal communcation approach;Internal/external aids    Potential to Achieve Goals Good             Patient will benefit from skilled therapeutic intervention in order to improve the following deficits and impairments:   Aphasia    Problem List Patient Active Problem List   Diagnosis Date Noted   Acute blood loss anemia 11/10/2020   Abnormal LFTs 11/10/2020   Aphasia 10/30/2020   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

## 2021-01-19 NOTE — Therapy (Signed)
[facility] [address] [place], [place], [address] Phone: [phone]   Fax:  [phone]  Physical Therapy Treatment  Patient Details  Name: Jane Martin MRN: [id number] Date of Birth: Jan 11, 1996 Referring Provider (PT): Marlowe Shores, PA-C (to see Raulkar)   Encounter Date: 01/19/2021   PT End of Session - 01/19/21 1044     Visit Number 12    Number of Visits 18    Authorization Type U[employer]    PT Start Time 0848    PT Stop Time 0930    PT Time Calculation (min) 42 min    Activity Tolerance Patient tolerated treatment well    Behavior During Therapy [facility] for tasks assessed/performed             Past Medical History:  Diagnosis Date   Stroke ([place]) 10/07/2020    Past Surgical History:  Procedure Laterality Date   CRANIOTOMY Left 10/17/2020   Procedure: CRANIOTOMY HEMATOMA Evacuation;  Surgeon: Kristeen Miss, MD;  Location: [facility];  Service: Neurosurgery;  Laterality: Left;   IR ANGIO INTRA EXTRACRAN SEL COM CAROTID INNOMINATE BILAT MOD SED  10/29/2020   IR ANGIO VERTEBRAL SEL VERTEBRAL BILAT MOD SED  10/29/2020   IR US GUIDE VASC ACCESS RIGHT  10/29/2020    There were no vitals filed for this visit.   Subjective Assessment - 01/19/21 1037     Subjective No changes, no pain.  "I'm just tired; over the numbness"    Pertinent History No significant PMH    Patient Stated Goals Pt's goal for therapy are for RLE to stop being numb and get back to normal.    Currently in Pain? No/denies                [facility] PT Assessment - 01/19/21 0001       Strength   Overall Strength Deficits    Right Hip Extension 3+/5    Right Hip ABduction 4/5    Left Hip Extension 4/5    Left Hip ABduction 4/5    Right Knee Flexion 4/5    Left Knee Flexion 4+/5      Functional Gait  Assessment   Gait assessed  Yes    Gait Level Surface Walks 20 ft in less than 7 sec but greater than 5.5 sec, uses assistive  device, slower speed, mild gait deviations, or deviates 6-10 in outside of the 12 in walkway width.   5.72   Change in Gait Speed Able to smoothly change walking speed without loss of balance or gait deviation. Deviate no more than 6 in outside of the 12 in walkway width.    Gait with Horizontal Head Turns Performs head turns smoothly with slight change in gait velocity (eg, minor disruption to smooth gait path), deviates 6-10 in outside 12 in walkway width, or uses an assistive device.   veers L   Gait with Vertical Head Turns Performs head turns with no change in gait. Deviates no more than 6 in outside 12 in walkway width.    Gait and Pivot Turn Pivot turns safely within 3 sec and stops quickly with no loss of balance.    Step Over Obstacle Is able to step over 2 stacked shoe boxes taped together (9 in total height) without changing gait speed. No evidence of imbalance.    Gait with Narrow Base of Support Is able to ambulate for 10 steps heel to toe with no staggering.    Gait with  Eyes Closed Walks 20 ft, uses assistive device, slower speed, mild gait deviations, deviates 6-10 in outside 12 in walkway width. Ambulates 20 ft in less than 9 sec but greater than 7 sec.   7.66   Ambulating Backwards Walks 20 ft, no assistive devices, good speed, no evidence for imbalance, normal gait    Steps Alternating feet, no rail.    Total Score 27    FGA comment: Improved from 19/30                           [facility] Adult PT Treatment/Exercise - 01/19/21 0001       Transfers   Transfers Sit to Stand;Stand to Sit    Sit to Stand 6: Modified independent (Device/Increase time);Without upper extremity assist;From chair/3-in-1    Five time sit to stand comments  10.44    Stand to Sit 6: Modified independent (Device/Increase time);Without upper extremity assist;To chair/3-in-1    Number of Reps 2 sets;Other reps (comment)   5 reps   Comments 1st set 15.88 sec; PT clarified instructions for 5x  sit<>stand test "quickly" and safely-see lower score above      Ambulation/Gait   Ambulation/Gait Yes    Ambulation/Gait Assistance 7: Independent    Ambulation Distance (Feet) 1597 Feet    Assistive device None    Gait Pattern Step-through pattern;Right genu recurvatum    Ambulation Surface Level;Indoor    Gait velocity 6.78 sec = 4.83 ft/sec    Gait Comments Pt ambulates for 6 minutes in gym area (busy gym and pt has to slow several times for other patients).              SciFit, elevated seat, level 2>2.5, lower extremities only, x 7 minutes, cues to attend to RLE and to keeping steps/minute>75-80, at times, pt keeps >95      PT Education - 01/19/21 1043     Education Details Progress towards goals and discussed POC; discussed continueing PT to continue to address RLE strength deficits, continued use of Bioness for strength/proprioception.  Pt in agreement.    Person(s) Educated Patient    Methods Explanation    Comprehension Verbalized understanding              PT Short Term Goals - 12/25/20 1202       PT SHORT TERM GOAL #1   Title Pt will be independent with HEP for improved strength, balance, and gait.  TARGET 12/26/2020    Baseline Performing, but needs min reminder cues    Time 5    Period Weeks    Status Partially Met      PT SHORT TERM GOAL #2   Title Pt will perform 5x sit<>stand in less than or equal to 14 sec for improved transfer efficiency and functional strength.    Baseline 17.09> 14.09 sec 12/25/20    Time 5    Period Weeks    Status Achieved      PT SHORT TERM GOAL #3   Title Pt will improve single limb stance on RLE to at least 10 sec for improved balance, functional strength.    Baseline 3.5 sec; 10 sec, 3 reps 12/25/20    Time 5    Period Weeks    Status Achieved      PT SHORT TERM GOAL #4   Title Pt/family will verbalize understanding of fall prevention in home environment.    Time 5    Period Weeks  Status Achieved      PT  SHORT TERM GOAL #5   Title 6 minute walk test to improve to at least 1700 ft for improved gait efficiency over longer distances.    Baseline 1589 ft> 1684 ft 12/25/20    Time 5    Period Weeks    Status Not Met               PT Long Term Goals - 01/19/21 1044       PT LONG TERM GOAL #1   Title Pt will be independent with progression of HEP for improved strength, balance, and gait.  TARGET 01/23/2021    Time 9    Period Weeks    Status New      PT LONG TERM GOAL #2   Title Pt will improve 5x sit<>stand to less than or equal to 12 sec to demonstrate improved functional strength and transfer efficiency.    Baseline 10.44 sec    Time 9    Period Weeks    Status Achieved      PT LONG TERM GOAL #3   Title Pt will improve FGA score to at least 24/30 to decrease fall risk.    Baseline 19/30> 27/30    Time 9    Period Weeks    Status Achieved      PT LONG TERM GOAL #4   Title Pt will improve gait velocity to at least 4 ft/sec for improved gait efficiency and safety.    Baseline 3.28 ft/sec (pt reports always being a fast walker)>4.83 ft/sec    Time 9    Period Weeks    Status Achieved      PT LONG TERM GOAL #5   Title Pt will ambulate at least 1000 ft, indoor and outdoor surfaces, no LOB, independently, for improved community gait.    Time 9    Period Weeks    Status New      PT LONG TERM GOAL #6   Title Pt will improve FOTO to at least 81% (predicted FOTO at d/c) to demo improved overall functional mobility.    Baseline 68%    Time 9    Period Weeks    Status New                   Plan - 01/19/21 1046     Clinical Impression Statement Began assessing LTGs this visit, with pt meeting LTG 2, 3, and 4 assessed today.  Pt is progressing with improved functional strength with 5x sit<>stand, improved dynamic balance with FGA score improving to 27/30, and gait velocity improving to 4.83 ft/sec.  Assessed strength in MMT today, with pt noting slight improvement in  R hamstring strength, but continued decreased strength through R hip and knee musculature.  Discussed pt's continued progress and continued deficits with strength; decided to continue/extend POC for additional 2x/wk for 4 weeks after this week for more targeted strengthening of RLE further use of Bioness to assist with strengthening and proprioception.    Personal Factors and Comorbidities Other   No pertient PMH; severity of deficits, including aphasia   Examination-Activity Limitations Locomotion Level;Transfers;Stairs    Examination-Participation Restrictions Occupation;Community Activity;Driving    Stability/Clinical Decision Making Evolving/Moderate complexity    Rehab Potential Good    PT Frequency 2x / week   2x/wk x 8 weeks   PT Duration Other (comment)   plus 1x/wk on eval week; 9 week POC   PT Treatment/Interventions ADLs/Self Care  Home Management;Gait training;Stair training;Functional mobility training;Therapeutic activities;Therapeutic exercise;Balance training;Neuromuscular re-education;Electrical Stimulation;Patient/family education;Manual techniques;Passive range of motion    PT Next Visit Plan Check remaining LTGs (internet down today, so I did what I could remember); will need to recert after next visit; continue use of Bioness to R hamstring; add continued hamstring, hip extensor strenghtening for pt for HEP    PT Home Exercise Plan Access Code: [email or web address]    Consulted and Agree with Plan of Care Patient             Patient will benefit from skilled therapeutic intervention in order to improve the following deficits and impairments:  Abnormal gait, Difficulty walking, Decreased balance, Impaired sensation, Decreased strength, Decreased mobility  Visit Diagnosis: Muscle weakness (generalized)  Other abnormalities of gait and mobility     Problem List Patient Active Problem List   Diagnosis Date Noted   Acute blood loss anemia 11/10/2020   Abnormal LFTs 11/10/2020    Aphasia 10/30/2020   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 01/19/2021, 10:53 AM Frazier Butt., PT   [facility] [address] [place], [place], [address] Phone: [phone]   Fax:  [phone]  Name: Jane Martin MRN: [id number] Date of Birth: 1996/04/26

## 2021-01-20 ENCOUNTER — Encounter: Payer: 59 | Attending: Psychology | Admitting: Physical Medicine and Rehabilitation

## 2021-01-20 ENCOUNTER — Encounter: Payer: Self-pay | Admitting: Physical Medicine and Rehabilitation

## 2021-01-20 VITALS — BP 111/74 | HR 81 | Temp 98.3°F | Ht 65.0 in | Wt 154.2 lb

## 2021-01-20 DIAGNOSIS — I612 Nontraumatic intracerebral hemorrhage in hemisphere, unspecified: Secondary | ICD-10-CM | POA: Diagnosis not present

## 2021-01-20 DIAGNOSIS — R200 Anesthesia of skin: Secondary | ICD-10-CM | POA: Insufficient documentation

## 2021-01-20 NOTE — Progress Notes (Signed)
Subjective:    Patient ID: Jane Martin, female    DOB: [birth year], 25 y.o.   MRN: [id number]  HPI Jane Martin is a 25 year old woman who presents for hospital follow-up of hemorrhagic stroke.  - she continues to have right sided numbness throughout her arm and leg- this affects her ability to write with her right hand. She is not in the practice of writing with her left hand.   -She smoked marijuana before the stroke and she used to drink [place] and other hard liquor. She has since abstained from all substance use and asks whether she may start drinking alcohol again  -She has ha f/u with neurology. Has PCP (her OBGYN)  -Not currently taking any medications  -Denies pain    Pain Inventory Average Pain 0 Pain Right Now 0 My pain is  no pain    BOWEL Number of stools per week: nornal   BLADDER Normal   Mobility walk without assistance ability to climb steps?  yes do you drive?  yes  Function not employed: date last employed not currently working  Neuro/Psych numbness tingling  Prior Studies Any changes since last visit?  no  Physicians involved in your care Any changes since last visit?  no   Family History  Problem Relation Age of Onset   Hypertension Mother    Intracerebral hemorrhage Mother        due to HTN   Healthy Father    Social History   Socioeconomic History   Marital status: Single    Spouse name: Not on file   Number of children: Not on file   Years of education: Not on file   Highest education level: Not on file  Occupational History   Not on file  Tobacco Use   Smoking status: Never   Smokeless tobacco: Never  Vaping Use   Vaping Use: Never used  Substance and Sexual Activity   Alcohol use: Yes   Drug use: Yes    Types: Marijuana   Sexual activity: Not on file  Other Topics Concern   Not on file  Social History Narrative   Not on file   Social Determinants of Health   Financial Resource Strain: Not on file   Food Insecurity: Not on file  Transportation Needs: Not on file  Physical Activity: Not on file  Stress: Not on file  Social Connections: Not on file   Past Surgical History:  Procedure Laterality Date   CRANIOTOMY Left 10/17/2020   Procedure: CRANIOTOMY HEMATOMA Evacuation;  Surgeon: Barnett Abu, MD;  Location: [facility] OR;  Service: Neurosurgery;  Laterality: Left;   IR ANGIO INTRA EXTRACRAN SEL COM CAROTID INNOMINATE BILAT MOD SED  10/29/2020   IR ANGIO VERTEBRAL SEL VERTEBRAL BILAT MOD SED  10/29/2020   IR US GUIDE VASC ACCESS RIGHT  10/29/2020   Past Medical History:  Diagnosis Date   Stroke ([facility]) 10/07/2020   BP 111/74   Pulse 81   Temp 98.3 F (36.8 C)   Ht 5\' 5"  (1.651 m)   Wt 154 lb 3.2 oz (69.9 kg)   SpO2 98%   BMI 25.66 kg/m   Opioid Risk Score:   Fall Risk Score:  `1  Depression screen PHQ 2/9  Depression screen [facility] 2/9 01/20/2021 12/23/2020  Decreased Interest 0 0  Down, Depressed, Hopeless 0 0  PHQ - 2 Score 0 0  Altered sleeping 0 -  Tired, decreased energy 0 -  Change in appetite 0 -  Feeling bad or failure about yourself  0 -  Trouble concentrating 0 -  Moving slowly or fidgety/restless 0 -  Suicidal thoughts 0 -  PHQ-9 Score 0 -     Review of Systems  Constitutional: Negative.   HENT: Negative.    Eyes: Negative.   Respiratory: Negative.    Cardiovascular: Negative.   Gastrointestinal: Negative.   Endocrine: Negative.   Genitourinary: Negative.   Musculoskeletal: Negative.   Skin: Negative.   Allergic/Immunologic: Negative.   Neurological:  Positive for numbness.       Reports aphasia and tingling with numbness  Hematological: Negative.   Psychiatric/Behavioral: Negative.    All other systems reviewed and are negative.     Objective:   Physical Exam Gen: no distress, normal appearing HEENT: oral mucosa pink and moist, NCAT Cardio: Reg rate Chest: normal effort, normal rate of breathing Abd: soft, non-distended Ext: no edema Psych:  pleasant, normal affect Skin: intact Neuro: Decreased sensation (0/2) throughout right side. Strength intact.      Assessment & Plan:  Jane Martin is a 25 year old woman who presents for follow-up after CIR admission for hemorrhagic stroke  1) Hemorrhagic stroke -continue outpatient therapy -continue daily walking -continued daily resistance training -she would like to start drinking alcohol again. Discussed that she can start with no more than 1 glass of red wine per night and she is agreeable to this.   2) Right sided numbness: -discussed prognosis -practice writing with left hand.

## 2021-01-21 ENCOUNTER — Ambulatory Visit: Payer: No Typology Code available for payment source | Admitting: Occupational Therapy

## 2021-01-21 ENCOUNTER — Ambulatory Visit: Payer: No Typology Code available for payment source | Admitting: Speech Pathology

## 2021-01-21 ENCOUNTER — Ambulatory Visit: Payer: No Typology Code available for payment source | Admitting: Physical Therapy

## 2021-01-22 ENCOUNTER — Other Ambulatory Visit: Payer: Self-pay

## 2021-01-22 DIAGNOSIS — I612 Nontraumatic intracerebral hemorrhage in hemisphere, unspecified: Secondary | ICD-10-CM

## 2021-01-22 DIAGNOSIS — R200 Anesthesia of skin: Secondary | ICD-10-CM | POA: Diagnosis not present

## 2021-01-22 NOTE — Progress Notes (Signed)
Behavioral Observations  The patient appeared well-groomed and appropriately dressed for the testing session. Her manners were appropriate to the situation. The patient had difficulty holding a pencil and writing. The patient mentioned repeatedly that she was irritated and wanted to leave. She had difficulty getting her words out and reported that she was very frustrated by that. She also answered her cell phone several times during the test and toward the end seemed uninterested. In the beginning of the session the patient demonstrated good effort but her effort seemed to decline as the session continued.   Neuropsychology Note  Jane Martin completed 150 minutes of neuropsychological testing with technician, Marica Otter, BA, under the supervision of Arley Phenix, PsyD., Clinical Neuropsychologist. The patient did not appear overtly distressed by the testing session, per behavioral observation or via self-report to the technician. Rest breaks were offered.   Clinical Decision Making: In considering the patient's current level of functioning, level of presumed impairment, nature of symptoms, emotional and behavioral responses during clinical interview, level of literacy, and observed level of motivation/effort, a battery of tests was selected by Dr. Kieth Brightly during initial consultation on 12/15/20. This was communicated to the technician. Communication between the neuropsychologist and technician was ongoing throughout the testing session and changes were made as deemed necessary based on patient performance on testing, technician observations and additional pertinent factors such as those listed above.  Tests Administered: Controlled Oral Word Association Test (COWAT; FAS & Animals)  Finger Tapping Test (FTT) Grooved Pegboard Hand Dynamometer  Wechsler Adult Intelligence Scale, 4th Edition (WAIS-IV) Wechsler Memory Scale, 4th Edition (WMS-IV); Adult Battery or Older Adult Battery    Results:  COWAT FAS combined = 15 Animals = 9   Grooved Pegboard Right hand time: 5:01.51 # drops: 12 Left hand time: 1.43.47 # drops: 0  Hand Dynamometer Right hand 1: 13 Right hand 2: 12  Left hand 1: 13 Left hand 2: 9   Finger Tapping  Right 1 - 12 2 - 21 3 - 21 4 - 21 5 - 17 Left 1 - 22 2 - 30 3 - 34 4 - 24 5 - 27    WAIS  Composite Score Summary  Scale Sum of Scaled Scores Composite Score Percentile Rank 95% Conf. Interval Qualitative Description  Verbal Comprehension 11 VCI 63 1 59-70 Extremely Low  Perceptual Reasoning 13 PRI 67 1 62-75 Extremely Low  Working Memory 3 WMI 53 0.1 49-63 Extremely Low  Processing Speed 3 PSI 53 0.1 49-66 Extremely Low  Full Scale 30 FSIQ 53 0.1 50-58 Extremely Low  General Ability 24 GAI 61 0.5 57-67 Extremely Low      Verbal Comprehension Subtests Summary  Subtest Raw Score Scaled Score Percentile Rank Reference Group Scaled Score SEM  Similarities 5 1 0.1 1 1.16  Vocabulary 13 5 5 4  0.73  Information 5 5 5 5  0.90  (Comprehension) 17 7 16 7  1.08       Perceptual Reasoning Subtests Summary  Subtest Raw Score Scaled Score Percentile Rank Reference Group Scaled Score SEM  Block Design 16 4 2 4  1.20  Matrix Reasoning 10 5 5 5  1.04  Visual Puzzles 6 4 2 4  0.95  (Figure Weights) 10 7 16 7  1.04  (Picture Completion) 9 7 16 7  1.27       Working Raw Score Scaled Score Percentile Rank Reference Group Scaled Score SEM  Digit Span 12 2 0.4 2 0.90  Arithmetic 3 1  0.1 1 1.20  (Letter-Number Seq.) 6 1 0.1 1 1.16       Processing Speed Subtests Summary  Subtest Raw Score Scaled Score Percentile Rank Reference Group Scaled Score SEM  Symbol Search 10 2 0.4 2 1.31  Coding 21 1 0.1 1 1.16  (Cancellation) 18 3 1 3  1.31        WMS  Index Score Summary  Index Sum of Scaled Scores Index Score Percentile Rank 95% Confidence Interval Qualitative Descriptor   Auditory Memory (AMI) 9 51 0.1 47-60 Extremely Low  Visual Memory (VMI) 22 73 4 68-80 Borderline  Visual Working Memory (VWMI) 8 63 1 58-73 Extremely Low  Immediate Memory (IMI) 16 60 0.4 56-68 Extremely Low  Delayed Memory (DMI) 15 58 0.3 54-67 Extremely Low      Primary Subtest Scaled Score Summary  Subtest Domain Raw Score Scaled Score Percentile Rank  Logical Memory I AM 7 1 0.1  Logical Memory II AM 2 1 0.1  Verbal Paired Associates I AM 14 4 2   Verbal Paired Associates II AM 5 3 1   Designs I VM 43 3 1  Designs II VM 39 5 5  Visual Reproduction I VM 36 8 25  Visual Reproduction II VM 18 6 9   Spatial Addition VWM 7 5 5   Symbol Span VWM 7 3 1       Auditory Memory Process Score Summary  Process Score Raw Score Scaled Score Percentile Rank Cumulative Percentage (Base Rate)  LM II Recognition 14 - - <=2%  VPA II Recognition 36 - - 3-9%       Visual Memory Process Score Summary  Process Score Raw Score Scaled Score Percentile Rank Cumulative Percentage (Base Rate)  DE I Content 28 5 5  -  DE I Spatial 13 5 5  -  DE II Content 27 6 9  -  DE II Spatial 8 5 5  -  DE II Recognition 11 - - 3-9%  VR II Recognition 4 - - 3-9%       ABILITY-MEMORY ANALYSIS  Ability Score:  VCI: 63 Date of Testing:  WAIS-IV; WMS-IV 2021/01/22  Predicted Difference Method   Index Predicted WMS-IV Index Score Actual WMS-IV Index Score Difference Critical Value  Significant Difference Y/N Base Rate  Auditory Memory 81 51 30 9.64 Y 1%  Visual Memory 83 73 10 8.87 Y 20-25%  Visual Working Memory 80 63 17 10.59 Y 10%  Immediate Memory 79 60 19 9.93 Y 5-10%  Delayed Memory 81 58 23 9.68 Y 4%  Statistical significance (critical value) at the .01 level.    Feedback to Patient: Jane Martin will return on 05/11/2021 for an interactive feedback session with Dr. at which time her test performances, clinical impressions and treatment recommendations will be reviewed in  detail. The patient understands she can contact our office should she require our assistance before this time.  150 minutes spent face-to-face with patient administering standardized tests, 30 minutes spent scoring ). [CPT , 96139]  Full report to follow.

## 2021-02-04 ENCOUNTER — Ambulatory Visit: Payer: No Typology Code available for payment source

## 2021-02-04 ENCOUNTER — Ambulatory Visit: Payer: No Typology Code available for payment source | Attending: Physician Assistant | Admitting: Occupational Therapy

## 2021-02-04 ENCOUNTER — Ambulatory Visit: Payer: No Typology Code available for payment source | Admitting: Physical Therapy

## 2021-02-04 ENCOUNTER — Encounter: Payer: Self-pay | Admitting: Occupational Therapy

## 2021-02-04 ENCOUNTER — Other Ambulatory Visit: Payer: Self-pay

## 2021-02-04 DIAGNOSIS — R278 Other lack of coordination: Secondary | ICD-10-CM | POA: Diagnosis present

## 2021-02-04 DIAGNOSIS — R4701 Aphasia: Secondary | ICD-10-CM | POA: Insufficient documentation

## 2021-02-04 DIAGNOSIS — R2689 Other abnormalities of gait and mobility: Secondary | ICD-10-CM | POA: Insufficient documentation

## 2021-02-04 DIAGNOSIS — R2681 Unsteadiness on feet: Secondary | ICD-10-CM | POA: Diagnosis present

## 2021-02-04 DIAGNOSIS — M6281 Muscle weakness (generalized): Secondary | ICD-10-CM | POA: Insufficient documentation

## 2021-02-04 DIAGNOSIS — R41844 Frontal lobe and executive function deficit: Secondary | ICD-10-CM | POA: Insufficient documentation

## 2021-02-04 DIAGNOSIS — I69151 Hemiplegia and hemiparesis following nontraumatic intracerebral hemorrhage affecting right dominant side: Secondary | ICD-10-CM | POA: Insufficient documentation

## 2021-02-04 DIAGNOSIS — R41840 Attention and concentration deficit: Secondary | ICD-10-CM | POA: Insufficient documentation

## 2021-02-04 DIAGNOSIS — R208 Other disturbances of skin sensation: Secondary | ICD-10-CM | POA: Insufficient documentation

## 2021-02-04 NOTE — Therapy (Signed)
[facility] [address] [place], [place], [address] Phone: [phone]   Fax:  [phone]  Speech Language Pathology Treatment  Patient Details  Name: Jane Martin MRN: [id number] Date of Birth: 01-06-1996 Referring Provider (SLP): Dr. Sula Soda   Encounter Date: 02/04/2021   End of Session - 02/04/21 1211     Visit Number 15    Number of Visits 25   MD cert says 9/21   Date for SLP Re-Evaluation 02/16/21    Authorization Type no auth; 130 VL PT/OT/ST combined    SLP Start Time 1147    SLP Stop Time  1230    SLP Time Calculation (min) 43 min    Activity Tolerance Patient tolerated treatment well;Patient limited by lethargy             Past Medical History:  Diagnosis Date   Stroke (HCC) 10/07/2020    Past Surgical History:  Procedure Laterality Date   CRANIOTOMY Left 10/17/2020   Procedure: CRANIOTOMY HEMATOMA Evacuation;  Surgeon: Barnett Abu, MD;  Location: [facility] OR;  Service: Neurosurgery;  Laterality: Left;   IR ANGIO INTRA EXTRACRAN SEL COM CAROTID INNOMINATE BILAT MOD SED  10/29/2020   IR ANGIO VERTEBRAL SEL VERTEBRAL BILAT MOD SED  10/29/2020   IR US GUIDE VASC ACCESS RIGHT  10/29/2020    There were no vitals filed for this visit.   Subjective Assessment - 02/04/21 1148     Subjective "I want to go to work"    Currently in Pain? No/denies                   ADULT SLP TREATMENT - 02/04/21 1148       General Information   Behavior/Cognition Alert;Cooperative;Pleasant mood      Treatment Provided   Treatment provided Cognitive-Linquistic      Cognitive-Linquistic Treatment   Treatment focused on Aphasia;Patient/family/caregiver education    Skilled Treatment Pt reports speech is "better" and pt is reading at home but has not completed other HEP due to busy schedule. Pt verbalized desire to return to work. Noticable lethargy exhibited and reported this session, which may have  partly impacted performance this session. SLP targeted word finding for filling in blank for sentences with 3-4 blanks. Occasional min to mod verbal and visual cues required to aid anomia and spelling. Usual additional processing time required.      Assessment / Recommendations / Plan   Plan Continue with current plan of care      Progression Toward Goals   Progression toward goals Progressing toward goals              SLP Education - 02/04/21 1226     Education Details HEP    Person(s) Educated Patient    Methods Explanation;Demonstration;Handout    Comprehension Verbalized understanding;Returned demonstration;Need further instruction              SLP Short Term Goals - 01/19/21 1017       SLP SHORT TERM GOAL #1   Title Pt will name 10 items in persoanlly relevant category with rare min A over 2 sessions    Baseline 12-22-20, 12-31-20    Status Achieved      SLP SHORT TERM GOAL #2   Title Pt will utilize 2 compensatory strategies for aphasia in structured language tasks 8/10 opportunities with occasional min A over 2 sessions    Baseline 12-22-20; 12/29/20    Status Achieved  SLP SHORT TERM GOAL #3   Title Pt will comprehend 3 sentence paragraph with rare min A over 2 sessions    Baseline 12-25-20, 12/29/20    Status Achieved      SLP SHORT TERM GOAL #4   Title Pt will write at sentence level, IDing errors with occasional min 8/10 errors    Baseline 12-15-20, 12-31-20    Status Achieved      SLP SHORT TERM GOAL #5   Title Pt will carryover compensations for aphasia during 12 minute simple conversation as needed with occasional min A    Baseline 12-31-20    Status Achieved              SLP Long Term Goals - 02/04/21 1214       SLP LONG TERM GOAL #1   Title Pt will complete complex naming tasks with 90% accuracy and occasional min A over 2 sessions    Time 3    Period Weeks    Status On-going      SLP LONG TERM GOAL #2   Title Pt will comprehend 3  paragraph passages of interest to her, restating 2 salient details from each paragraph with occasional min A    Baseline 01/19/21;    Time 3    Period Weeks    Status On-going      SLP LONG TERM GOAL #3   Title Pt will ID and correct errors on texts, e mails and other 4 sentence written communications 9/10 with occasional min A    Time 3    Period Weeks    Status On-going      SLP LONG TERM GOAL #4   Title Pt will carryover compensations for aphasia as needed over 20 minute moderately complex conversation with occasional min A over 2 sessions    Baseline 01-07-21    Time 3    Period Weeks    Status On-going      SLP LONG TERM GOAL #5   Title Pt will improve score on The Communicative Participation Item Bank by 4 points    Time 3    Period Weeks    Status On-going              Plan - 02/04/21 1217     Clinical Impression Statement Chrystine continues to make progress with reading comprehension as well as written and verbal expression. She is Id'ing and and self correcting aphasic errors on structured speech tasks, reading, and writing with rare min A and extended time. Written expression continues to require significant extended time at 1-2 sentence level.  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    Speech Therapy Frequency 2x / week    Duration 12 weeks   25 visits   Treatment/Interventions SLP instruction and feedback;Environmental controls;Language facilitation;Cueing hierarchy;Compensatory strategies;Functional tasks;Cognitive reorganization;Compensatory techniques;Patient/family education;Multimodal communcation approach;Internal/external aids    Potential to Achieve Goals Good    SLP Home Exercise Plan provided    Consulted and Agree with Plan of Care Patient             Patient will benefit from skilled therapeutic intervention in order to improve the following  deficits and impairments:   Aphasia    Problem List Patient Active Problem List   Diagnosis Date Noted   Acute blood loss anemia 11/10/2020   Abnormal LFTs 11/10/2020   Aphasia 10/30/2020   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 [place], [place], [address] Phone: [phone]   Fax:  [phone]   Name: SHAYLE DONAHOO MRN: [id number] Date of Birth: 1995-12-08

## 2021-02-04 NOTE — Patient Instructions (Signed)
Access Code: [email or web address] URL: [email or web address] Date: 02/04/2021 Prepared by: Sallyanne Kuster  Exercises Standard Lunge - 1 x daily - 7 x weekly - 2 sets - 10 reps Side Stepping with Resistance at Ankles - 1 x daily - 7 x weekly - 3 sets - 10 reps Forward and Backward Monster Walk with Resistance at Ankles and Counter Support - 1 x daily - 7 x weekly - 3 sets - 10 reps Standing Anti-Rotation Press with Anchored Resistance - 1 x daily - 7 x weekly - 3 sets - 10 reps Standing Knee Flexion AROM with Chair Support - 1 x daily - 75 x weekly - 3 sets - 10 reps Standing Bilateral Gastroc Stretch with Step - 1-2 x daily - 7 x weekly - 1 sets - 3 reps - 30 sec hold

## 2021-02-04 NOTE — Therapy (Signed)
[facility] [address] [place], [place], [address] Phone: [phone]   Fax:  [phone]  Occupational Therapy Treatment  Patient Details  Name: Jane Martin MRN: [id number] Date of Birth: 06/01/1996 Referring Provider (OT): f/u w Ihor Austin, NP   Encounter Date: 02/04/2021   OT End of Session - 02/04/21 1235     Visit Number 16    Number of Visits 25    Date for OT Re-Evaluation 02/16/21    Authorization Type [facility]    Authorization Time Period VL:MN    OT Start Time 1234    OT Stop Time 1315    OT Time Calculation (min) 41 min    Activity Tolerance Patient tolerated treatment well    Behavior During Therapy [facility] for tasks assessed/performed             Past Medical History:  Diagnosis Date   Stroke (HCC) 10/07/2020    Past Surgical History:  Procedure Laterality Date   CRANIOTOMY Left 10/17/2020   Procedure: CRANIOTOMY HEMATOMA Evacuation;  Surgeon: Barnett Abu, MD;  Location: [facility] OR;  Service: Neurosurgery;  Laterality: Left;   IR ANGIO INTRA EXTRACRAN SEL COM CAROTID INNOMINATE BILAT MOD SED  10/29/2020   IR ANGIO VERTEBRAL SEL VERTEBRAL BILAT MOD SED  10/29/2020   IR US GUIDE VASC ACCESS RIGHT  10/29/2020    There were no vitals filed for this visit.   Subjective Assessment - 02/04/21 1235     Subjective  "nothing much really - still numb. once i get my glasses I should be fine. I want to go back to work"    Pertinent History None    Limitations Aphasia (expressive and receptive), Absent sensation on R hemi    Patient Stated Goals "to be able to feel myself, do for myself, drive"    Currently in Pain? No/denies                          OT Treatments/Exercises (OP) - 02/04/21 1237       ADLs   Work pt reports she wants to return to work - pt reports she would not be doing any cooking but would have to manage people and scheduling, and a little math with working with  people's hours.      Cognitive Exercises   Problem Solving functional problem solving with organizing day with complex errands with difficulty with mental flexibility and inferring from statements without concrete appt times.  Max assistance and cues required. Pt with poor awareness into deficits and what was difficult about activity. Assisted patient with making list and comprehension of reading the paragraph d/t cognitive and communication deficits.    Keyboarding Typing Test on Typing Master with 5 wpm 41% accuracy and 2 wpm net speed.                      OT Short Term Goals - 01/07/21 1033       OT SHORT TERM GOAL #1   Title Pt will be independent with HEP for RUE coordination and grip strength    Time 4    Period Weeks    Status Achieved   coordination HEP, theraputty red 12/22/20   Target Date 12/22/20      OT SHORT TERM GOAL #2   Title Pt will verbalize understanding of sensory strategies for increased safety awareness    Time 4    Period  Weeks    Status On-going   reviewed sensory strategies for safety, still needing cues 12/22/20     OT SHORT TERM GOAL #3   Title Pt will perform environmental scanning in moderately distracting environment with 90% accuracy or greater.    Time 4    Period Weeks    Status Achieved   93% 01/07/21     OT SHORT TERM GOAL #4   Title Pt will improve box and blocks score by 4 blocks or more with RUE for increase in overall functional use of RUE.    Baseline R 21 L 51    Time 4    Period Weeks    Status Achieved   RUE 30 blocks 12/22/20     OT SHORT TERM GOAL #5   Title Pt will perform simple warm meal prep with supervision and good safety awareness    Time 4    Period Weeks    Status Achieved   supervision 12/22/20              OT Long Term Goals - 01/19/21 1024       OT LONG TERM GOAL #1   Title Pt will be independent with updated HEPs    Time 12    Period Weeks    Status New      OT LONG TERM GOAL #2   Title Pt  will increase grip strength in RUE by 8 lbs or greater for increasein functional use of RUE, dominant, hand.    Baseline R 24.6, L 42.1    Time 12    Period Weeks    Status Achieved   42.6 lbs with RUE 01/19/21     OT LONG TERM GOAL #3   Title Pt will complete 9 hole peg test in 2 minutes or less with RUE for increase in fine motor coordination    Baseline R 8 pegs in 2 min, 30 seconds L 24.63s    Time 12    Period Weeks    Status Achieved   01/19/21 RUE 45.44s     OT LONG TERM GOAL #4   Title Pt will complete physical and cognitive task simultaneously with 90% accuracy or greater    Time 12    Period Weeks    Status New      OT LONG TERM GOAL #5   Title Pt will demonstrate or report increased ease with styling her own hair with bilateral UE.    Time 12    Period Weeks    Status Achieved      OT LONG TERM GOAL #6   Title Pt and caregiver will verbalize undersatnding for return to driving and return to work recommendations.    Time 12    Period Weeks    Status On-going      OT LONG TERM GOAL #7   Title Pt will score a 57 or greater on FOTO at discharge.    Baseline 38    Time 12    Period Weeks    Status New                   Plan - 02/04/21 1246     Clinical Impression Statement Pt continues to work towards goals. Pt net speed typing test of 2 wpm which is a primary job requirement of her work.    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 ADL;Decreased knowledge of use of DME;Strength;Dexterity;GMC;Proprioception;UE functional use;ROM;IADL;Vision;Decreased knowledge of precautions;FMC;Sensation;Coordination    Cognitive Skills Problem Solve;Understand;Perception;Thought;Sequencing    Rehab Potential Good    OT Frequency 2x / week     OT Duration 12 weeks    OT Treatment/Interventions Self-care/ADL training;Fluidtherapy;DME and/or AE instruction;Therapeutic activities;Cognitive remediation/compensation;Visual/perceptual remediation/compensation;Patient/family education;Passive range of motion;Manual Therapy;Energy conservation;Electrical Stimulation;Neuromuscular education;Building services engineer;Therapeutic exercise    Plan quadruped for weight bearing/proprioception RUE, handwriting, functional use of RUE, recertification next visit.    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 [place], [place], [address] Phone: [phone]   Fax:  [phone]  Name: Jane Martin MRN:  [id number] Date of Birth: 1996/06/24

## 2021-02-05 ENCOUNTER — Encounter (HOSPITAL_COMMUNITY): Payer: Self-pay

## 2021-02-05 ENCOUNTER — Other Ambulatory Visit: Payer: Self-pay

## 2021-02-05 ENCOUNTER — Ambulatory Visit (HOSPITAL_COMMUNITY)
Admission: EM | Admit: 2021-02-05 | Discharge: 2021-02-05 | Disposition: A | Discharge status: Home / Self Care | Payer: No Typology Code available for payment source | Attending: Physician Assistant | Admitting: Physician Assistant

## 2021-02-05 DIAGNOSIS — B373 Candidiasis of vulva and vagina: Secondary | ICD-10-CM | POA: Insufficient documentation

## 2021-02-05 DIAGNOSIS — B3731 Acute candidiasis of vulva and vagina: Secondary | ICD-10-CM

## 2021-02-05 MED ORDER — FLUCONAZOLE 150 MG PO TABS: 150.0000 mg | ORAL_TABLET | Freq: Every day | ORAL | 0 refills | Status: DC

## 2021-02-05 NOTE — ED Triage Notes (Signed)
Pt in with c/o vaginal discharge x 3 days  States her vagina feels very dry and painful  Pt tried monistat with no relief

## 2021-02-05 NOTE — ED Provider Notes (Signed)
[facility]    CSN: [id number] Arrival date & time: 02/05/21  1657      History   Chief Complaint Chief Complaint  Patient presents with   Vaginal Discharge    HPI Jane Martin is a 25 y.o. female.   Pt complains of increased vaginal discharge, vaginal itching and burning that started about 3 days ago.  She has tried over the [employer] with no relief.  Denies foul odor, pelvic pain, vaginal bleeding, fever, chills.  She is sexually active, reports partner(s) has no symptoms.  Reports uses protection.     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      Home Medications    Prior to Admission medications   Medication Sig Start Date End Date Taking? Authorizing Provider  acetaminophen (TYLENOL) 325 MG tablet Take 2 tablets (650 mg total) by mouth every 6 (six) hours as needed for mild pain (or temp > 37.5 C (99.5 F)). 10/30/20   Leroy Sea, MD  artificial tears (LACRILUBE) OINT ophthalmic ointment Place 1 application into both eyes as needed (apply to protect eyes from lash extensions). 10/30/20   Leroy Sea, MD    Family History Family History  Problem Relation Age of Onset   Hypertension  Mother    Intracerebral hemorrhage Mother        due to HTN   Healthy Father     Social History Social History   Tobacco Use   Smoking status: Never   Smokeless tobacco: Never  Vaping Use   Vaping Use: Never used  Substance Use Topics   Alcohol use: Yes   Drug use: Yes    Types: Marijuana     Allergies   Flagyl [metronidazole]   Review of Systems Review of Systems  Constitutional:  Negative for chills and fever.  HENT:  Negative for ear pain and sore throat.   Eyes:  Negative for pain and visual disturbance.  Respiratory:  Negative for cough and shortness of breath.   Cardiovascular:  Negative for chest pain and palpitations.  Gastrointestinal:  Negative for abdominal pain and vomiting.  Genitourinary:  Positive for vaginal discharge. Negative for dysuria, hematuria, pelvic pain, vaginal bleeding and vaginal pain.       Vaginal itching  Musculoskeletal:  Negative for arthralgias and back pain.  Skin:  Negative for color change and rash.  Neurological:  Negative for seizures and syncope.  All other systems reviewed and are negative.   Physical Exam Triage Vital Signs ED Triage Vitals  Enc Vitals Group     BP 02/05/21 1758 (!) 107/93     Pulse Rate 02/05/21 1758 (!) 114  Resp 02/05/21 1758 18     Temp 02/05/21 1758 99.9 F (37.7 C)     Temp Source 02/05/21 1758 Oral     SpO2 02/05/21 1758 98 %     Weight --      Height --      Head Circumference --      Peak Flow --      Pain Score 02/05/21 1757 10     Pain Loc --      Pain Edu? --      Excl. in GC? --    No data found.  Updated Vital Signs BP (!) 107/93 (BP Location: Right Arm)   Pulse (!) 114   Temp 99.9 F (37.7 C) (Oral)   Resp 18   LMP 02/02/2021 (Approximate)   SpO2 98%   Visual Acuity Right Eye Distance:   Left Eye Distance:   Bilateral Distance:    Right Eye Near:   Left Eye Near:    Bilateral Near:     Physical Exam Vitals and nursing note reviewed.  Constitutional:       General: She is not in acute distress.    Appearance: She is well-developed.  HENT:     Head: Normocephalic and atraumatic.  Eyes:     Conjunctiva/sclera: Conjunctivae normal.  Cardiovascular:     Rate and Rhythm: Normal rate and regular rhythm.     Heart sounds: No murmur heard. Pulmonary:     Effort: Pulmonary effort is normal. No respiratory distress.     Breath sounds: Normal breath sounds.  Abdominal:     Palpations: Abdomen is soft.     Tenderness: There is no abdominal tenderness.  Genitourinary:    Comments: White discharge noted, no rashes, swelling, or redness to vulva Musculoskeletal:     Cervical back: Neck supple.  Skin:    General: Skin is warm and dry.  Neurological:     Mental Status: She is alert.     UC Treatments / Results  Labs (all labs ordered are listed, but only abnormal results are displayed) Labs Reviewed  CERVICOVAGINAL ANCILLARY ONLY    EKG   Radiology No results found.  Procedures Procedures (including critical care time)  Medications Ordered in UC Medications - No data to display  Initial Impression / Assessment and Plan / UC Course  I have reviewed the triage vital signs and the nursing notes.  Pertinent labs & imaging results that were available during my care of the patient were reviewed by me and considered in my medical decision making (see chart for details).     Will treat vaginal yeast.  Cervicovaginal lab pending, will change treatment plan if needed based on results.   Final Clinical Impressions(s) / UC Diagnoses   Final diagnoses:  None   Discharge Instructions   None    ED Prescriptions   None    PDMP not reviewed this encounter.   Jodell Cipro, PA-C 02/05/21 1846

## 2021-02-05 NOTE — Addendum Note (Signed)
Addended by: Gean Maidens on: 02/05/2021 07:36 PM   Modules accepted: Orders

## 2021-02-05 NOTE — Therapy (Addendum)
[facility] [address] [place], [place], [address] Phone: [phone]   Fax:  [phone]  Physical Therapy Treatment/REcert  Patient Details  Name: Jane Martin MRN: [id number] Date of Birth: 1996/01/06 Referring Provider (PT): Marlowe Shores, PA-C (to see Raulkar)   Encounter Date: 02/04/2021   PT End of Session - 02/04/21 1319     Visit Number 13    Number of Visits 18    Authorization Type UMR/Medcost    PT Start Time 9357    PT Stop Time 1357    PT Time Calculation (min) 40 min    Activity Tolerance Patient tolerated treatment well    Behavior During Therapy [facility] for tasks assessed/performed             Past Medical History:  Diagnosis Date   Stroke ([place]) 10/07/2020    Past Surgical History:  Procedure Laterality Date   CRANIOTOMY Left 10/17/2020   Procedure: CRANIOTOMY HEMATOMA Evacuation;  Surgeon: Kristeen Miss, MD;  Location: [facility];  Service: Neurosurgery;  Laterality: Left;   IR ANGIO INTRA EXTRACRAN SEL COM CAROTID INNOMINATE BILAT MOD SED  10/29/2020   IR ANGIO VERTEBRAL SEL VERTEBRAL BILAT MOD SED  10/29/2020   IR US GUIDE VASC ACCESS RIGHT  10/29/2020    There were no vitals filed for this visit.   Subjective Assessment - 02/04/21 1319     Subjective No new complaints. No falls or pain to report.    Pertinent History No significant PMH    Patient Stated Goals Pt's goal for therapy are for RLE to stop being numb and get back to normal.    Currently in Pain? No/denies    Pain Score 0-No pain                   [facility] Adult PT Treatment/Exercise - 02/04/21 1320       Transfers   Transfers Sit to Stand;Stand to Sit    Sit to Stand 6: Modified independent (Device/Increase time);Without upper extremity assist;From chair/3-in-1    Stand to Sit 6: Modified independent (Device/Increase time);Without upper extremity assist;To chair/3-in-1      Ambulation/Gait   Ambulation/Gait Yes     Ambulation/Gait Assistance 7: Independent    Ambulation Distance (Feet) 1000 Feet    Assistive device None    Gait Pattern Step-through pattern;Right genu recurvatum    Ambulation Surface Level;Unlevel;Indoor;Outdoor;Paved      Self-Care   Self-Care Other Self-Care Comments    Other Self-Care Comments  Administered FOTO survey today with pt scoring 87 on her FS Primary Measure.                 Balance Exercises - 02/04/21 1345       Balance Exercises: Standing   SLS with Vectors Foam/compliant surface;Intermittent upper extremity assist;Other reps (comment);Limitations    SLS with Vectors Limitations standing across blue foam beam with 2 tall cones in front on floor: alternating forward, then cross foot taps for ~10 reps each with occasional touch to counter for balance.    Tandem Gait Forward;Retro;Intermittent upper extremity support;4 reps;Limitations    Tandem Gait Limitations on blue foam beam with heel taps to floor between steps for 4 lap each way. light to no UE support on counter with cues on posture, step placement on beam.    Sidestepping Foam/compliant support;4 reps;Limitations    Sidestepping Limitations on blue foam beam with no UE support for 4 laps each way, cues on step height/not  to slide foot along.    Sit to Stand Standard surface;Without upper extremity support;Foam/compliant surface;Limitations    Sit to Stand Limitations seated with feet across red foam beam:            Issued to HEP this session: Access Code: [email or web address] URL: [email or web address] Date: 02/04/2021 Prepared by: Willow Ora  Exercises Standard Lunge - 1 x daily - 7 x weekly - 2 sets - 10 reps Side Stepping with Resistance at Ankles - 1 x daily - 7 x weekly - 3 sets - 10 reps- advanced to green band Forward and Backward Monster Walk with Resistance at Ankles and Counter Support - 1 x daily - 7 x weekly - 3 sets - 10 reps- advanced to green band Standing Anti-Rotation  Press with Anchored Resistance - 1 x daily - 7 x weekly - 3 sets - 10 reps- advanced to green band Standing Knee Flexion AROM with Chair Support - 1 x daily - 75 x weekly - 3 sets - 10 reps- pt to add ankle weight Standing Bilateral Gastroc Stretch with Step - 1-2 x daily - 7 x weekly - 1 sets - 3 reps - 30 sec hold   PT Education - 02/04/21 1333     Education Details progress toward remaining goals; updated HEP    Person(s) Educated Patient    Methods Explanation;Demonstration;Verbal cues;Handout    Comprehension Verbalized understanding;Returned demonstration;Verbal cues required;Need further instruction              PT Short Term Goals - 12/25/20 1202       PT SHORT TERM GOAL #1   Title Pt will be independent with HEP for improved strength, balance, and gait.  TARGET 12/26/2020    Baseline Performing, but needs min reminder cues    Time 5    Period Weeks    Status Partially Met      PT SHORT TERM GOAL #2   Title Pt will perform 5x sit<>stand in less than or equal to 14 sec for improved transfer efficiency and functional strength.    Baseline 17.09> 14.09 sec 12/25/20    Time 5    Period Weeks    Status Achieved      PT SHORT TERM GOAL #3   Title Pt will improve single limb stance on RLE to at least 10 sec for improved balance, functional strength.    Baseline 3.5 sec; 10 sec, 3 reps 12/25/20    Time 5    Period Weeks    Status Achieved      PT SHORT TERM GOAL #4   Title Pt/family will verbalize understanding of fall prevention in home environment.    Time 5    Period Weeks    Status Achieved      PT SHORT TERM GOAL #5   Title 6 minute walk test to improve to at least 1700 ft for improved gait efficiency over longer distances.    Baseline 1589 ft> 1684 ft 12/25/20    Time 5    Period Weeks    Status Not Met               PT Long Term Goals - 02/04/21 1328       PT LONG TERM GOAL #1   Title Pt will be independent with progression of HEP for improved  strength, balance, and gait.  TARGET 01/23/2021    Baseline 02/04/21: met with current program, advanced HEP this session    Time --  Period --    Status Achieved      PT LONG TERM GOAL #2   Title Pt will improve 5x sit<>stand to less than or equal to 12 sec to demonstrate improved functional strength and transfer efficiency.    Baseline 10.44 sec    Status Achieved      PT LONG TERM GOAL #3   Title Pt will improve FGA score to at least 24/30 to decrease fall risk.    Baseline 19/30> 27/30    Status Achieved      PT LONG TERM GOAL #4   Title Pt will improve gait velocity to at least 4 ft/sec for improved gait efficiency and safety.    Baseline 3.28 ft/sec (pt reports always being a fast walker)>4.83 ft/sec    Status Achieved      PT LONG TERM GOAL #5   Title Pt will ambulate at least 1000 ft, indoor and outdoor surfaces, no LOB, independently, for improved community gait.    Baseline 02/04/21: met in session    Status Achieved      PT LONG TERM GOAL #6   Title Pt will improve FOTO to at least 81% (predicted FOTO at d/c) to demo improved overall functional mobility.    Baseline 02/04/21: pt scored 87% on retake this date    Status Achieved                   Plan - 02/04/21 1320     Clinical Impression Statement Today's skilled session focused on progress toward remaining LTGs for PT recert. Pt met remainder of goals checked today. Advanced HEP this session. Remainder of session focused on balance training on complaint surfaces with no issues noted or reported. The pt should benefit from continued PT to progress toward goals of recert.    Personal Factors and Comorbidities Other   No pertient PMH; severity of deficits, including aphasia   Examination-Activity Limitations Locomotion Level;Transfers;Stairs    Examination-Participation Restrictions Occupation;Community Activity;Driving    Stability/Clinical Decision Making Evolving/Moderate complexity    Rehab Potential Good     PT Frequency 2x / week   2x/wk x 8 weeks   PT Duration Other (comment)   plus 1x/wk on eval week; 9 week POC   PT Treatment/Interventions ADLs/Self Care Home Management;Gait training;Stair training;Functional mobility training;Therapeutic activities;Therapeutic exercise;Balance training;Neuromuscular re-education;Electrical Stimulation;Patient/family education;Manual techniques;Passive range of motion    PT Next Visit Plan continue use of Bioness to R hamstring; add continued hamstring, hip extensor strenghtening for pt for HEP    PT Home Exercise Plan Access Code: [email or web address]    Consulted and Agree with Plan of Care Patient             Patient will benefit from skilled therapeutic intervention in order to improve the following deficits and impairments:  Abnormal gait, Difficulty walking, Decreased balance, Impaired sensation, Decreased strength, Decreased mobility  Visit Diagnosis: Muscle weakness (generalized)  Other abnormalities of gait and mobility     Problem List Patient Active Problem List   Diagnosis Date Noted   Acute blood loss anemia 11/10/2020   Abnormal LFTs 11/10/2020   Aphasia 10/30/2020   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, [place], [place] [address] [phone] 08/04/221:48 PM,      For REcert:   PT End of Session - 02/04/21 1319  Visit Number 13    Number of Visits 20    Date for PT Re-Evaluation 73/66/81   per recert 11/10/45   Authorization Type UMR/Medcost    PT Start Time 0761    PT Stop Time 1357    PT Time Calculation (min) 40 min    Activity Tolerance Patient tolerated treatment well    Behavior During Therapy [facility] for tasks assessed/performed             Plan - 02/04/21 1320     Clinical Impression Statement Today's skilled session focused on  progress toward remaining LTGs for PT recert. Pt met remainder of goals checked today. Advanced HEP this session. Remainder of session focused on balance training on complaint surfaces with no issues noted or reported. The pt should benefit from continued PT to progress toward goals of recert.   PT NOTE:  Pt is progressing well, but continues to have high level dificulty due to decreased lower extremity strength and decreased gait endurance.  Please see recert and updated goals.   Personal Factors and Comorbidities Other   No pertient PMH; severity of deficits, including aphasia   Examination-Activity Limitations Locomotion Level;Transfers;Stairs    Examination-Participation Restrictions Occupation;Community Activity;Driving    Stability/Clinical Decision Making Evolving/Moderate complexity    Rehab Potential Good    PT Frequency 2x / week    PT Duration 4 weeks   including recert week 11/02/81   PT Treatment/Interventions ADLs/Self Care Home Management;Gait training;Stair training;Functional mobility training;Therapeutic activities;Therapeutic exercise;Balance training;Neuromuscular re-education;Electrical Stimulation;Patient/family education;Manual techniques;Passive range of motion    PT Next Visit Plan continue use of Bioness to R hamstring; add continued hamstring, hip extensor strenghtening for pt for HEP    PT Home Exercise Plan Access Code: [email or web address]    Consulted and Agree with Plan of Care Patient             PT Long Term Goals - 02/05/21 1930       PT LONG TERM GOAL #1   Title Pt will be independent with final progression of HEP for improved lower extremity strength  TARGET 03/06/2021    Baseline 02/04/21: met with current program, advanced HEP this session    Time 4    Period Weeks    Status Revised      PT LONG TERM GOAL #2   Title Pt will improve distance in 6 MWT to at least 1700 ft for improved gait efficiency and endurance.    Baseline 1597 ft  01/19/21    Time 4    Period  Weeks    Status New      PT LONG TERM GOAL #3   Title Pt will verbalize plans for continued community fitness to maximize gains made in rehab.    Time 4    Period Weeks    Status New             Name: MIREILLE LACOMBE MRN: [id number] Date of Birth: 1996-04-08  Mady Haagensen, PT 02/05/21 7:35 PM Phone: [phone] Fax: [phone]

## 2021-02-05 NOTE — Discharge Instructions (Addendum)
Will change treatment plan based on test results if needed Take medication as prescribed

## 2021-02-06 LAB — CERVICOVAGINAL ANCILLARY ONLY
Bacterial Vaginitis (gardnerella): POSITIVE — AB
Candida Glabrata: NEGATIVE
Candida Vaginitis: POSITIVE — AB
Chlamydia: NEGATIVE
Comment: NEGATIVE
Comment: NEGATIVE
Comment: NEGATIVE
Comment: NEGATIVE
Comment: NEGATIVE
Comment: NORMAL
Neisseria Gonorrhea: NEGATIVE
Trichomonas: NEGATIVE

## 2021-02-07 ENCOUNTER — Encounter (HOSPITAL_COMMUNITY): Payer: Self-pay | Admitting: Emergency Medicine

## 2021-02-07 ENCOUNTER — Ambulatory Visit (HOSPITAL_COMMUNITY)
Admission: EM | Admit: 2021-02-07 | Discharge: 2021-02-07 | Disposition: A | Discharge status: Home / Self Care | Payer: 59 | Attending: Emergency Medicine | Admitting: Emergency Medicine

## 2021-02-07 ENCOUNTER — Other Ambulatory Visit: Payer: Self-pay

## 2021-02-07 DIAGNOSIS — J039 Acute tonsillitis, unspecified: Secondary | ICD-10-CM | POA: Insufficient documentation

## 2021-02-07 DIAGNOSIS — N898 Other specified noninflammatory disorders of vagina: Secondary | ICD-10-CM | POA: Insufficient documentation

## 2021-02-07 DIAGNOSIS — Z711 Person with feared health complaint in whom no diagnosis is made: Secondary | ICD-10-CM | POA: Insufficient documentation

## 2021-02-07 LAB — POCT RAPID STREP A, ED / UC: Streptococcus, Group A Screen (Direct): NEGATIVE

## 2021-02-07 MED ORDER — VALACYCLOVIR HCL 1 G PO TABS: 1000.0000 mg | ORAL_TABLET | Freq: Three times a day (TID) | ORAL | 0 refills | Status: AC

## 2021-02-07 NOTE — Discharge Instructions (Addendum)
  You will be notified of results in about positive 1 week but can be seen in your MyChart account.  Take the valtrex as prescribed to help shorten duration of symptoms.  Avoid sexual activity when sores are present but also be sure to use protection even once sores have fully healed.    Follow up with primary care or gynecologist for recurrent symptoms.  Follow up with primary care for recheck of throat pain if not improving in 5-7 days, sooner if worsening.

## 2021-02-07 NOTE — ED Provider Notes (Signed)
[facility]    CSN: [id number] Arrival date & time: 02/07/21  1013      History   Chief Complaint Chief Complaint  Patient presents with   Vaginal Pain    HPI Jane Martin is a 25 y.o. female.   HPI Jane Martin is a 25 y.o. female presenting to UC with concern for possibly having herpes.  She was seen at this UC on 8/4 for vaginal discharge, was dx with BV and yeast. She was prescribed appropriate treatments but has not picked up her medication yet.  She has since noticed painful bumps in her vaginal area since yesterday. Pain is 10/10.  She has been applying yeast cream with minimal relief.  She has not taken oral pain medication. Pt also reports sore throat for about 2 days. Denies cough, congestion, n/v/d.  Denies HA or abdominal pain. Denies dysuria.      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      Home Medications    Prior to Admission medications   Medication Sig Start Date End Date Taking? Authorizing Provider  valACYclovir (VALTREX) 1000 MG tablet Take 1 tablet (1,000 mg total) by mouth 3 (three) times daily for 10 days. 02/07/21 02/17/21 Yes Yenty Bloch, Vangie Bicker, PA-C  acetaminophen (TYLENOL) 325 MG  tablet Take 2 tablets (650 mg total) by mouth every 6 (six) hours as needed for mild pain (or temp > 37.5 C (99.5 F)). 10/30/20   Leroy Sea, MD  artificial tears (LACRILUBE) OINT ophthalmic ointment Place 1 application into both eyes as needed (apply to protect eyes from lash extensions). 10/30/20   Leroy Sea, MD  fluconazole (DIFLUCAN) 150 MG tablet Take 1 tablet (150 mg total) by mouth daily. Take 1 tablet by mouth, if symptoms persist after 72 hours can take the second tablet 02/05/21   Jodell Cipro, PA-C    Family History Family History  Problem Relation Age of Onset   Hypertension Mother    Intracerebral hemorrhage Mother        due to HTN   Healthy Father     Social History Social History   Tobacco Use   Smoking status: Never   Smokeless tobacco: Never  Vaping Use   Vaping Use: Never used  Substance Use Topics   Alcohol use: Yes   Drug use: Yes    Types: Marijuana     Allergies   Flagyl [metronidazole]   Review of Systems Review of Systems  Constitutional:  Positive for fever. Negative for appetite change and chills.  HENT:  Positive for sore throat. Negative for congestion, ear pain and trouble swallowing.   Respiratory:  Negative for cough,  chest tightness and shortness of breath.   Gastrointestinal:  Negative for abdominal pain, diarrhea, nausea and vomiting.  Genitourinary:  Positive for genital sores, vaginal discharge and vaginal pain. Negative for pelvic pain and urgency.  Skin:  Negative for rash.  Neurological:  Negative for dizziness and headaches.  All other systems reviewed and are negative.   Physical Exam Triage Vital Signs ED Triage Vitals  Enc Vitals Group     BP 02/07/21 1141 118/81     Pulse Rate 02/07/21 1141 (!) 115     Resp 02/07/21 1141 18     Temp 02/07/21 1141 99 F (37.2 C)     Temp Source 02/07/21 1141 Oral     SpO2 02/07/21 1141 100 %     Weight --      Height --      Head Circumference --      Peak Flow --       Pain Score 02/07/21 1138 10     Pain Loc --      Pain Edu? --      Excl. in GC? --    No data found.  Updated Vital Signs BP 118/81 (BP Location: Left Arm)   Pulse (!) 115 Comment: pt reports being nervous  Temp 99 F (37.2 C) (Oral)   Resp 18   LMP 02/02/2021 (Approximate)   SpO2 100%   Visual Acuity Right Eye Distance:   Left Eye Distance:   Bilateral Distance:    Right Eye Near:   Left Eye Near:    Bilateral Near:     Physical Exam Vitals and nursing note reviewed. Exam conducted with a chaperone present.  Constitutional:      Appearance: Normal appearance. She is well-developed.  HENT:     Head: Normocephalic and atraumatic.     Right Ear: Tympanic membrane and ear canal normal.     Left Ear: Tympanic membrane and ear canal normal.     Nose: Nose normal.     Right Sinus: No maxillary sinus tenderness or frontal sinus tenderness.     Left Sinus: No maxillary sinus tenderness or frontal sinus tenderness.     Mouth/Throat:     Lips: Pink.     Mouth: Mucous membranes are moist.     Pharynx: Uvula midline. Oropharyngeal exudate present.     Tonsils: Tonsillar exudate and tonsillar abscess present.  Cardiovascular:     Rate and Rhythm: Regular rhythm. Tachycardia present.  Pulmonary:     Effort: Pulmonary effort is normal. No respiratory distress.     Breath sounds: Normal breath sounds. No stridor. No wheezing, rhonchi or rales.  Abdominal:     General: There is no distension.     Palpations: Abdomen is soft.     Tenderness: There is no abdominal tenderness.  Genitourinary:    Labia:        Right: Rash, tenderness and lesion present.        Left: Rash, tenderness and lesion present.      Comments: Multiple 1-21mm erythematous vesicles, some have ruptured. Scant yellow discharge. Tender. No bleeding.  Musculoskeletal:        General: Normal range of motion.     Cervical back: Normal range of motion and neck supple. No rigidity.  Lymphadenopathy:      Cervical: Cervical adenopathy present.  Skin:    General: Skin is warm and dry.  Neurological:     Mental Status: She is alert and oriented to person, place, and  time.  Psychiatric:        Behavior: Behavior normal.     UC Treatments / Results  Labs (all labs ordered are listed, but only abnormal results are displayed) Labs Reviewed  HSV CULTURE AND TYPING  CULTURE, GROUP A STREP [facility])  POCT RAPID STREP A, ED / UC    EKG   Radiology No results found.  Procedures Procedures (including critical care time)  Medications Ordered in UC Medications - No data to display  Initial Impression / Assessment and Plan / UC Course  I have reviewed the triage vital signs and the nursing notes.  Pertinent labs & imaging results that were available during my care of the patient were reviewed by me and considered in my medical decision making (see chart for details).    Temp 99*F and HR 115 Exudative tonsillitis noted on exam in addition to vaginal lesions. Pt denies hx of oral sex and denies concern for GC/Chlamydia. Does not want throat culture for STIs. HR was elevated during last visit as well. Denies chest pain, palpitations, or SOB.   Rapid strep: Negative HSV Culture of lesions: pending  Will tx empirically with valtrex 1g TID for 10 days Discussed safe sex practices even between outbreaks   Final Clinical Impressions(s) / UC Diagnoses   Final diagnoses:  Vaginal lesion  Vaginal discharge  Concern about STD in female without diagnosis  Exudative tonsillitis     Discharge Instructions       You will be notified of results in about positive 1 week but can be seen in your MyChart account.  Take the valtrex as prescribed to help shorten duration of symptoms.  Avoid sexual activity when sores are present but also be sure to use protection even once sores have fully healed.    Follow up with primary care or gynecologist for recurrent symptoms.  Follow up with  primary care for recheck of throat pain if not improving in 5-7 days, sooner if worsening.      ED Prescriptions     Medication Sig Dispense Auth. Provider   valACYclovir (VALTREX) 1000 MG tablet Take 1 tablet (1,000 mg total) by mouth 3 (three) times daily for 10 days. 30 tablet Lurene Shadow, [place]      PDMP not reviewed this encounter.   Lurene Shadow, [place] 02/07/21 1315

## 2021-02-07 NOTE — ED Triage Notes (Signed)
Pt presents today with c/o of "I think I have Herpes". She was seen here 8/4 and dx with yeast infection, but is concerned with new bumps to vagina along with pain since yesterday.

## 2021-02-09 ENCOUNTER — Telehealth (HOSPITAL_COMMUNITY): Payer: Self-pay | Admitting: Emergency Medicine

## 2021-02-09 MED ORDER — METRONIDAZOLE 0.75 % VA GEL: 1.0000 | Freq: Every day | VAGINAL | 0 refills | Status: AC

## 2021-02-10 LAB — CULTURE, GROUP A STREP (THRC)

## 2021-02-10 LAB — HSV CULTURE AND TYPING

## 2021-02-13 ENCOUNTER — Ambulatory Visit: Payer: No Typology Code available for payment source

## 2021-02-13 ENCOUNTER — Ambulatory Visit: Payer: No Typology Code available for payment source | Admitting: Physical Therapy

## 2021-02-13 ENCOUNTER — Ambulatory Visit: Payer: No Typology Code available for payment source | Admitting: Occupational Therapy

## 2021-02-16 DIAGNOSIS — Z719 Counseling, unspecified: Secondary | ICD-10-CM | POA: Diagnosis not present

## 2021-02-16 DIAGNOSIS — A609 Anogenital herpesviral infection, unspecified: Secondary | ICD-10-CM | POA: Diagnosis not present

## 2021-02-18 ENCOUNTER — Ambulatory Visit: Payer: No Typology Code available for payment source | Admitting: Physical Therapy

## 2021-02-18 ENCOUNTER — Other Ambulatory Visit: Payer: Self-pay

## 2021-02-18 ENCOUNTER — Encounter: Payer: Self-pay | Admitting: Occupational Therapy

## 2021-02-18 ENCOUNTER — Ambulatory Visit: Payer: No Typology Code available for payment source | Admitting: Speech Pathology

## 2021-02-18 ENCOUNTER — Encounter: Payer: Self-pay | Admitting: Physical Therapy

## 2021-02-18 ENCOUNTER — Ambulatory Visit: Payer: No Typology Code available for payment source | Admitting: Occupational Therapy

## 2021-02-18 ENCOUNTER — Encounter: Payer: Self-pay | Admitting: Speech Pathology

## 2021-02-18 DIAGNOSIS — M6281 Muscle weakness (generalized): Secondary | ICD-10-CM

## 2021-02-18 DIAGNOSIS — R208 Other disturbances of skin sensation: Secondary | ICD-10-CM

## 2021-02-18 DIAGNOSIS — R2681 Unsteadiness on feet: Secondary | ICD-10-CM

## 2021-02-18 DIAGNOSIS — R4701 Aphasia: Secondary | ICD-10-CM

## 2021-02-18 DIAGNOSIS — R2689 Other abnormalities of gait and mobility: Secondary | ICD-10-CM

## 2021-02-18 DIAGNOSIS — R278 Other lack of coordination: Secondary | ICD-10-CM

## 2021-02-18 DIAGNOSIS — I69151 Hemiplegia and hemiparesis following nontraumatic intracerebral hemorrhage affecting right dominant side: Secondary | ICD-10-CM

## 2021-02-18 DIAGNOSIS — R41840 Attention and concentration deficit: Secondary | ICD-10-CM

## 2021-02-18 NOTE — Therapy (Signed)
[facility] [address] [place], [place], [address] Phone: [phone]   Fax:  [phone]  Occupational Therapy Treatment & Recertification  Patient Details  Name: Jane Martin MRN: [id number] Date of Birth: Sep 27, 1995 Referring Provider (OT): f/u w Frann Rider, NP   Encounter Date: 02/18/2021   OT End of Session - 02/18/21 1104     Visit Number 17    Number of Visits 28    Date for OT Re-Evaluation 04/01/21   may d/c early based on progress   Authorization Type [facility]    Authorization Time Period VL:MN    OT Start Time 1104    OT Stop Time 1145    OT Time Calculation (min) 41 min    Activity Tolerance Patient tolerated treatment well    Behavior During Therapy [facility] for tasks assessed/performed             Past Medical History:  Diagnosis Date   Stroke ([place]) 10/07/2020    Past Surgical History:  Procedure Laterality Date   CRANIOTOMY Left 10/17/2020   Procedure: CRANIOTOMY HEMATOMA Evacuation;  Surgeon: Kristeen Miss, MD;  Location: [facility];  Service: Neurosurgery;  Laterality: Left;   IR ANGIO INTRA EXTRACRAN SEL COM CAROTID INNOMINATE BILAT MOD SED  10/29/2020   IR ANGIO VERTEBRAL SEL VERTEBRAL BILAT MOD SED  10/29/2020   IR US GUIDE VASC ACCESS RIGHT  10/29/2020    There were no vitals filed for this visit.   Subjective Assessment - 02/18/21 1104     Subjective  "it's been going good"    Patient is accompanied by: Family member   Jane Martin, Jane Martin   Pertinent History None    Limitations Aphasia (expressive and receptive), Absent sensation on R hemi    Patient Stated Goals "to be able to feel myself, do for myself, drive"    Currently in Pain? No/denies                          OT Treatments/Exercises (OP) - 02/18/21 1105       ADLs   Work reports going back to work in 2 weeks. 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 Pt worked on Administrator, [employer] with task with RUE and tan foam grip and with managing 15 employees and availability and time off requests. Pt had difficulty with attending to all the information on the sheet and sequencing through the information. Pt with some decreased awareness into deficits saying the "numbness" in RUE is why the task was challenging. Pt with improved handwriting and control of utensil                      OT Short Term Goals - 02/18/21 1222       OT SHORT TERM GOAL #1   Title Pt will be independent with HEP for RUE coordination and grip strength    Time 4    Period Weeks    Status Achieved   coordination HEP, theraputty red 12/22/20   Target Date 12/22/20      OT SHORT TERM GOAL #2   Title Pt will verbalize understanding of sensory strategies for increased safety awareness    Time 4    Period Weeks    Status Achieved   reviewed sensory strategies for safety, still needing cues 12/22/20     OT SHORT  TERM GOAL #3   Title Pt will perform environmental scanning in moderately distracting environment with 90% accuracy or greater.    Time 4    Period Weeks    Status Achieved   93% 01/07/21     OT SHORT TERM GOAL #4   Title Pt will improve box and blocks score by 4 blocks or more with RUE for increase in overall functional use of RUE.    Baseline R 21 L 51    Time 4    Period Weeks    Status Achieved   RUE 30 blocks 12/22/20     OT SHORT TERM GOAL #5   Title Pt will perform simple warm meal prep with supervision and good safety awareness    Time 4    Period Weeks    Status Achieved   supervision 12/22/20              OT Long Term Goals - 02/18/21 1224       OT LONG TERM GOAL #1   Title Pt will be independent with updated HEPs    Time 12    Period Weeks    Status On-going      OT LONG TERM GOAL #2   Title Pt will increase grip strength in RUE by 8 lbs or greater for increasein functional use of RUE, dominant, hand.    Baseline R  24.6, L 42.1    Time 12    Period Weeks    Status Achieved   42.6 lbs with RUE 01/19/21     OT LONG TERM GOAL #3   Title Pt will complete 9 hole peg test in 2 minutes or less with RUE for increase in fine motor coordination    Baseline R 8 pegs in 2 min, 30 seconds L 24.63s    Time 12    Period Weeks    Status Achieved   01/19/21 RUE 45.44s     OT LONG TERM GOAL #4   Title Pt will complete physical and cognitive task simultaneously with 90% accuracy or greater    Time 12    Period Weeks    Status On-going      OT LONG TERM GOAL #5   Title Pt will demonstrate or report increased ease with styling her own hair with bilateral UE.    Time 12    Period Weeks    Status Achieved      OT LONG TERM GOAL #6   Title Pt and caregiver will verbalize undersatnding for return to driving and return to work recommendations.    Time 12    Period Weeks    Status On-going      OT LONG TERM GOAL #7   Title Pt will score a 57 or greater on FOTO at discharge.    Baseline 38    Time 12    Period Weeks    Status New                   Plan - 02/18/21 1221     Clinical Impression Statement Pt is progressing towards goals. Pt reports she is supposed to go back to work in 2 weeks - continue to progress towards goals and assess work simulated tasks. Pt has met all STGs and is working towards [place]. Pt is    OT Occupational Profile and History Problem Focused Assessment - Including review of records relating to presenting problem    Occupational performance deficits (Please refer  to evaluation for details): IADL's;ADL's;Leisure;Work    Marketing executive / Function / Physical Skills ADL;Decreased knowledge of use of DME;Strength;Dexterity;GMC;Proprioception;UE functional use;ROM;IADL;Vision;Decreased knowledge of precautions;FMC;Sensation;Coordination    Cognitive Skills Problem Solve;Understand;Perception;Thought;Sequencing    Rehab Potential Good    OT Frequency 2x / week   1x/week for 1 week and  2x/week for 5 weeks = 6 weeks   OT Duration 6 weeks   11 visits over 6 weeks at renewal   OT Treatment/Interventions Self-care/ADL training;Fluidtherapy;DME and/or AE instruction;Therapeutic activities;Cognitive remediation/compensation;Visual/perceptual remediation/compensation;Patient/family education;Passive range of motion;Manual Therapy;Energy conservation;Electrical Stimulation;Neuromuscular education;Therapist, nutritional;Therapeutic exercise    Plan assess work simulated tasks, handwriting, typing, alternating attention    Consulted and Agree with Plan of Care Patient             Patient will benefit from skilled therapeutic intervention in order to improve the following deficits and impairments:   Body Structure / Function / Physical Skills: ADL, Decreased knowledge of use of DME, Strength, Dexterity, GMC, Proprioception, UE functional use, ROM, IADL, Vision, Decreased knowledge of precautions, FMC, Sensation, Coordination Cognitive Skills: Problem Solve, Understand, Perception, Thought, Sequencing     Visit Diagnosis: Muscle weakness (generalized)  Other abnormalities of gait and mobility  Unsteadiness on feet  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 [place], [place], [address] Phone: [phone]   Fax:  [phone]  Name: Jane Martin MRN: [id number] Date of Birth: 1995-12-31

## 2021-02-18 NOTE — Patient Instructions (Signed)
   Read 3 articles or stories that have 4 or more paragraphs  Type a 4 sentence summary of them and email or text it to  Me  [email or web address]  [phone]

## 2021-02-18 NOTE — Therapy (Signed)
[facility] [address] [place], [place], [address] Phone: [phone]   Fax:  [phone]  Speech Language Pathology Treatment  Patient Details  Name: Jane Martin MRN: [id number] Date of Birth: 16-Feb-1996 Referring Provider (SLP): Dr. Sula Soda   Encounter Date: 02/18/2021   End of Session - 02/18/21 1632     Visit Number 16    Number of Visits 33    Date for SLP Re-Evaluation 03/18/21    Authorization Type no auth; 130 VL PT/OT/ST combined    SLP Start Time 1017    SLP Stop Time  1100    SLP Time Calculation (min) 43 min    Activity Tolerance Patient tolerated treatment well             Past Medical History:  Diagnosis Date   Stroke ([facility]) 10/07/2020    Past Surgical History:  Procedure Laterality Date   CRANIOTOMY Left 10/17/2020   Procedure: CRANIOTOMY HEMATOMA Evacuation;  Surgeon: Barnett Abu, MD;  Location: [facility];  Service: Neurosurgery;  Laterality: Left;   IR ANGIO INTRA EXTRACRAN SEL COM CAROTID INNOMINATE BILAT MOD SED  10/29/2020   IR ANGIO VERTEBRAL SEL VERTEBRAL BILAT MOD SED  10/29/2020   IR US GUIDE VASC ACCESS RIGHT  10/29/2020    There were no vitals filed for this visit.   Subjective Assessment - 02/18/21 1021     Subjective "Its getting better"    Currently in Pain? No/denies                   ADULT SLP TREATMENT - 02/18/21 1022       General Information   Behavior/Cognition Alert;Cooperative;Pleasant mood      Treatment Provided   Treatment provided Cognitive-Linquistic      Cognitive-Linquistic Treatment   Treatment focused on Aphasia;Patient/family/caregiver education    Skilled Treatment Jane Martin is cleared to start back to work Sept the 4. 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 Practiced typing sentences she may have to type at work and sentneces re:  [employer] and menu with extended time and aphasic spelling errors 5/7 short sentences.Jane Martin compensated for aphasia in conversation with supervision cues in simple conversation.      Assessment / Recommendations / Plan   Plan Continue with current plan of care      Progression Toward Goals   Progression toward goals Progressing toward goals                SLP Short Term Goals - 02/18/21 1631       SLP SHORT TERM GOAL #1   Title Pt will name 10 items in [employer] relevant category with rare min A over 2 sessions    Baseline 12-22-20, 12-31-20    Status Achieved      SLP SHORT TERM GOAL #2   Title Pt will utilize 2 compensatory strategies for aphasia in structured language tasks 8/10 opportunities with occasional min A over 2 sessions    Baseline 12-22-20; 12/29/20    Status Achieved      SLP SHORT TERM GOAL #3   Title Pt will comprehend 3 sentence paragraph with rare min A over 2 sessions    Baseline 12-25-20, 12/29/20    Status Achieved      SLP SHORT TERM GOAL #4   Title Pt will write at sentence level, IDing errors with occasional min 8/10 errors  Baseline 12-15-20, 12-31-20    Status Achieved      SLP SHORT TERM GOAL #5   Title Pt will carryover compensations for aphasia during 12 minute simple conversation as needed with occasional min A    Baseline 12-31-20    Status Achieved              SLP Long Term Goals - 02/18/21 1631       SLP LONG TERM GOAL #1   Title Pt will complete complex naming tasks with 90% accuracy and occasional min A over 2 sessions    Time 6    Period Weeks    Status On-going      SLP LONG TERM GOAL #2   Title Pt will comprehend 3 paragraph passages of interest to her, restating 2 salient details from each paragraph with occasional min A    Baseline 01/19/21;    Time 6    Period Weeks    Status On-going      SLP LONG TERM GOAL #3   Title Pt will ID and correct errors on texts, e mails and other 4 sentence written  communications 9/10 with occasional min A    Time 6    Period Weeks    Status On-going      SLP LONG TERM GOAL #4   Title Pt will carryover compensations for aphasia as needed over 20 minute moderately complex conversation with occasional min A over 2 sessions    Baseline 01-07-21    Time 6    Period Weeks    Status On-going      SLP LONG TERM GOAL #5   Title Pt will improve score on The Communicative Participation Item Bank by 4 points    Time 6    Period Weeks    Status On-going              Plan - 02/18/21 1629     Clinical Impression Statement Jane Martin continues to make progress with reading comprehension as well as written and verbal expression. She is Id'ing and and self correcting aphasic errors on structured speech tasks, reading, and writing with rare min A and extended time. Written expression continues to require significan extended time at 1-2 sentence level.  She plans to return to work 03/08/21.  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 Re-cert completed.    Speech Therapy Frequency 2x / week    Duration --   16 weeks, 33 visits   Treatment/Interventions SLP instruction and feedback;Environmental controls;Language facilitation;Cueing hierarchy;Compensatory strategies;Functional tasks;Cognitive reorganization;Compensatory techniques;Patient/family education;Multimodal communcation approach;Internal/external aids             Patient will benefit from skilled therapeutic intervention in order to improve the following deficits and impairments:   Aphasia    Problem List Patient Active Problem List   Diagnosis Date Noted   Acute blood loss anemia 11/10/2020   Abnormal LFTs 11/10/2020   Aphasia 10/30/2020   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

## 2021-02-18 NOTE — Therapy (Signed)
[facility] [address] [place], [place], [address] Phone: [phone]   Fax:  [phone]  Physical Therapy Treatment  Patient Details  Name: Jane Martin MRN: [id number] Date of Birth: 07-27-1995 Referring Provider (PT): Marlowe Shores, PA-C (to see Raulkar)   Encounter Date: 02/18/2021   PT End of Session - 02/18/21 0935     Visit Number 14    Number of Visits 20    Date for PT Re-Evaluation 49/44/96   per recert 01/06/90   Authorization Type [employer]MR/[employer]    PT Start Time 0933    PT Stop Time 1011    PT Time Calculation (min) 38 min    Activity Tolerance Patient tolerated treatment well    Behavior During Therapy [facility] for tasks assessed/performed             Past Medical History:  Diagnosis Date   Stroke ([place]) 10/07/2020    Past Surgical History:  Procedure Laterality Date   CRANIOTOMY Left 10/17/2020   Procedure: CRANIOTOMY HEMATOMA Evacuation;  Surgeon: Kristeen Miss, MD;  Location: [facility];  Service: Neurosurgery;  Laterality: Left;   IR ANGIO INTRA EXTRACRAN SEL COM CAROTID INNOMINATE BILAT MOD SED  10/29/2020   IR ANGIO VERTEBRAL SEL VERTEBRAL BILAT MOD SED  10/29/2020   IR US GUIDE VASC ACCESS RIGHT  10/29/2020    There were no vitals filed for this visit.   Subjective Assessment - 02/18/21 0935     Subjective No new complaints. No falls or pain to report. "Still numb".    Pertinent History No significant PMH    Patient Stated Goals Pt's goal for therapy are for RLE to stop being numb and get back to normal.    Currently in Pain? No/denies    Pain Score 0-No pain                     [facility] Adult PT Treatment/Exercise - 02/18/21 0936       Transfers   Transfers Sit to Stand;Stand to Sit    Sit to Stand 6: Modified independent (Device/Increase time);Without upper extremity assist;From chair/3-in-1    Stand to Sit 6: Modified independent (Device/Increase time);Without upper extremity  assist;To chair/3-in-1      Ambulation/Gait   Ambulation/Gait Yes    Ambulation/Gait Assistance 7: Independent    Ambulation Distance (Feet) --   around clinic with session   Assistive device None    Gait Pattern Within Functional Limits    Ambulation Surface Level;Indoor      Knee/Hip Exercises: Aerobic   Elliptical level 1.0 x 2 minutes each forward/backwards with bil UE support. cues on posture.                 Balance Exercises - 02/18/21 0943       Balance Exercises: Standing   Rebounder Static;Foam/compliant surface;Single leg;Tandem;Limitations;Double leg    Rebounder Limitations on solid floor: right single leg stance, then tandem stance with each foot forward for 10-12 throws using red ball, min guard assist;  on airex: bil stance with wide BOS progressing to narrow BOS, then right single leg stance, then tandem with each foot forward for 10-12 throws each with red ball. min guard to min assist for balance.    Other Standing Exercises BOSU- alternating forward step ups with contralateral march for 10 reps each side with light UE support; then laterally stepping up with contralateral march for 10 reps each side with light UE support, min guard  for safety with both ex's; on inverted BOSU- rocking anterior/posterior direction, then laterally for 10 reps each with emphasis on knee control and posture with no UE support, min guard assist; holdingthe BOSU steady for EC 30 sec's x 3 reps, progressing to EC with head movements left<>right and up<>down for ~10 reps each. min guard for balance/safety.    Other Standing Exercises Comments on mini tramp- fast marching in place, lateral jumps out/in, scissor jumps, vertical jumps wtih 90 degree turns. min guard assist with cues on form and technique. cues for right knee control with stance and landing with no recurvatum noted or buckling.                 PT Short Term Goals - 02/05/21 1928       PT SHORT TERM GOAL #1   Title  STGs = LTGs               PT Long Term Goals - 02/05/21 1930       PT LONG TERM GOAL #1   Title Pt will be independent with final progression of HEP for improved lower extremity strength  TARGET 03/06/2021    Baseline 02/04/21: met with current program, advanced HEP this session    Time 4    Period Weeks    Status Revised      PT LONG TERM GOAL #2   Title Pt will improve distance in 6 MWT to at least 1700 ft for improved gait efficiency and endurance.    Baseline 1597 ft  01/19/21    Time 4    Period Weeks    Status New      PT LONG TERM GOAL #3   Title Pt will verbalize plans for continued community fitness to maximize gains made in rehab.    Time 4    Period Weeks    Status New                   Plan - 02/18/21 0935     Clinical Impression Statement Today's skilled session continued to focus on high level balance and strengthening with no issues noted or reported in session. Unable to use Bioness this session due to leggings too tight to pull up leg and too thick to go over  the material. The pt is progressing and should benefit from continued PT to progress toward unmet goals.    Personal Factors and Comorbidities Other   No pertient PMH; severity of deficits, including aphasia   Examination-Activity Limitations Locomotion Level;Transfers;Stairs    Examination-Participation Restrictions Occupation;Community Activity;Driving    Stability/Clinical Decision Making Evolving/Moderate complexity    Rehab Potential Good    PT Frequency 2x / week    PT Duration 4 weeks   including recert week 0/1/02   PT Treatment/Interventions ADLs/Self Care Home Management;Gait training;Stair training;Functional mobility training;Therapeutic activities;Therapeutic exercise;Balance training;Neuromuscular re-education;Electrical Stimulation;Patient/family education;Manual techniques;Passive range of motion    PT Next Visit Plan continue to work on right LE strengthening and balance, add to  HEP as indicated. Use of Bioness to right Hamstring as able    PT Home Exercise Plan Access Code: [email or web address]    Consulted and Agree with Plan of Care Patient             Patient will benefit from skilled therapeutic intervention in order to improve the following deficits and impairments:  Abnormal gait, Difficulty walking, Decreased balance, Impaired sensation, Decreased strength, Decreased mobility  Visit Diagnosis: Muscle weakness (generalized)  Other  abnormalities of gait and mobility     Problem List Patient Active Problem List   Diagnosis Date Noted   Acute blood loss anemia 11/10/2020   Abnormal LFTs 11/10/2020   Aphasia 10/30/2020   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

## 2021-02-20 ENCOUNTER — Ambulatory Visit: Payer: No Typology Code available for payment source | Admitting: Occupational Therapy

## 2021-02-20 ENCOUNTER — Other Ambulatory Visit: Payer: Self-pay

## 2021-02-20 ENCOUNTER — Ambulatory Visit: Payer: No Typology Code available for payment source

## 2021-02-20 ENCOUNTER — Ambulatory Visit: Payer: No Typology Code available for payment source | Admitting: Physical Therapy

## 2021-02-20 ENCOUNTER — Encounter: Payer: Self-pay | Admitting: Physical Therapy

## 2021-02-20 DIAGNOSIS — M6281 Muscle weakness (generalized): Secondary | ICD-10-CM | POA: Diagnosis not present

## 2021-02-20 DIAGNOSIS — R208 Other disturbances of skin sensation: Secondary | ICD-10-CM

## 2021-02-20 DIAGNOSIS — R2689 Other abnormalities of gait and mobility: Secondary | ICD-10-CM

## 2021-02-20 DIAGNOSIS — R41844 Frontal lobe and executive function deficit: Secondary | ICD-10-CM

## 2021-02-20 DIAGNOSIS — R2681 Unsteadiness on feet: Secondary | ICD-10-CM

## 2021-02-20 DIAGNOSIS — R278 Other lack of coordination: Secondary | ICD-10-CM

## 2021-02-20 DIAGNOSIS — R41840 Attention and concentration deficit: Secondary | ICD-10-CM

## 2021-02-20 DIAGNOSIS — R4701 Aphasia: Secondary | ICD-10-CM

## 2021-02-20 NOTE — Therapy (Signed)
[facility] [address] [place], [place], [address] Phone: [phone]   Fax:  [phone]  Physical Therapy Treatment  Patient Details  Name: Jane Martin MRN: [id number] Date of Birth: 05-Feb-1996 Referring Provider (PT): Marlowe Shores, PA-C (to see Raulkar)   Encounter Date: 02/20/2021   PT End of Session - 02/20/21 0847     Visit Number 15    Number of Visits 20    Date for PT Re-Evaluation 80/32/12   per recert 08/08/80   Authorization Type UMR/Medcost    PT Start Time 0846    PT Stop Time 0926    PT Time Calculation (min) 40 min    Activity Tolerance Patient tolerated treatment well    Behavior During Therapy [facility] for tasks assessed/performed             Past Medical History:  Diagnosis Date   Stroke ([place]) 10/07/2020    Past Surgical History:  Procedure Laterality Date   CRANIOTOMY Left 10/17/2020   Procedure: CRANIOTOMY HEMATOMA Evacuation;  Surgeon: Kristeen Miss, MD;  Location: [facility];  Service: Neurosurgery;  Laterality: Left;   IR ANGIO INTRA EXTRACRAN SEL COM CAROTID INNOMINATE BILAT MOD SED  10/29/2020   IR ANGIO VERTEBRAL SEL VERTEBRAL BILAT MOD SED  10/29/2020   IR US GUIDE VASC ACCESS RIGHT  10/29/2020    There were no vitals filed for this visit.   Subjective Assessment - 02/20/21 0846     Subjective No new complaints. No falls or pain to report. Felt okay after last session.    Pertinent History No significant PMH    Patient Stated Goals Pt's goal for therapy are for RLE to stop being numb and get back to normal.    Currently in Pain? No/denies                     [facility] Adult PT Treatment/Exercise - 02/20/21 0847       Transfers   Transfers Sit to Stand;Stand to Sit    Sit to Stand 6: Modified independent (Device/Increase time);Without upper extremity assist;From chair/3-in-1    Stand to Sit 6: Modified independent (Device/Increase time);Without upper extremity assist;To  chair/3-in-1      Ambulation/Gait   Ambulation/Gait Yes    Ambulation/Gait Assistance 7: Independent    Assistive device None    Gait Pattern Within Functional Limits    Ambulation Surface Indoor      Exercises   Exercises Other Exercises    Other Exercises  with green band around LE's above knees: in squat position for side stepping left<>right, then diagonal stepping forward/backward for 3 laps each way with supervison, cues on form and technique; prone on mat table- right LE strengthening as follows- HS curls with red band resistance for 2 sets of 10 reps, then SLR with knee extension for 2 sets of 10 reps, then glut kick backs for 2 sets of 10 reps. cues/assist needed for correct form and controlled movements.      Knee/Hip Exercises: Aerobic   Elliptical level 1.0 x 2 minutes each forward/backwards with bil UE support. cues on posture.                 Balance Exercises - 02/20/21 0858       Balance Exercises: Standing   SLS Solid surface;Other reps (comment);Limitations    SLS Limitations right single leg stance on 1 inch foam with 3 color circles in semi circle pattern in front on floor-  left toe taps to circle and back working CW<>CCW for 5-6 laps each way. intermittent touch to coutner for balance with min guard assist for safety.    SLS with Vectors Foam/compliant surface;Intermittent upper extremity assist;Other reps (comment);Limitations    SLS with Vectors Limitations on balance board with 2 tall cones: alternating forward, lateral, forward double, lateral double foot taps for ~10 reps each/each side with cues on posture, stance position and weight shifting, min guard to min assist with occasional touch to bars for balance assistance.    Tandem Gait Forward;Retro;Intermittent upper extremity support;4 reps;Limitations    Tandem Gait Limitations on blue foam beam with heel taps to floor prior to placing foot on beam, no UE support with occasional touch to bars for  balance. min guard assist for safety.    Sidestepping Foam/compliant support;4 reps;Limitations    Sidestepping Limitations on blue foam beam with no UE support for 4 laps each way, cues on step height/not to slide foot along. Min guard assist for safety                 PT Short Term Goals - 02/05/21 1928       PT SHORT TERM GOAL #1   Title STGs = LTGs               PT Long Term Goals - 02/05/21 1930       PT LONG TERM GOAL #1   Title Pt will be independent with final progression of HEP for improved lower extremity strength  TARGET 03/06/2021    Baseline 02/04/21: met with current program, advanced HEP this session    Time 4    Period Weeks    Status Revised      PT LONG TERM GOAL #2   Title Pt will improve distance in 6 MWT to at least 1700 ft for improved gait efficiency and endurance.    Baseline 1597 ft  01/19/21    Time 4    Period Weeks    Status New      PT LONG TERM GOAL #3   Title Pt will verbalize plans for continued community fitness to maximize gains made in rehab.    Time 4    Period Weeks    Status New                   Plan - 02/20/21 0847     Clinical Impression Statement Today's skilled session continued to focus on LE strengthening (emphasis on right LE) and balance training on compliant surfaces with no issues noted or reported in session. The pt is making progress and should benefit from continued PT to progress toward unmet goals.    Personal Factors and Comorbidities Other   No pertient PMH; severity of deficits, including aphasia   Examination-Activity Limitations Locomotion Level;Transfers;Stairs    Examination-Participation Restrictions Occupation;Community Activity;Driving    Stability/Clinical Decision Making Evolving/Moderate complexity    Rehab Potential Good    PT Frequency 2x / week    PT Duration 4 weeks   including recert week 11/08/24   PT Treatment/Interventions ADLs/Self Care Home Management;Gait training;Stair  training;Functional mobility training;Therapeutic activities;Therapeutic exercise;Balance training;Neuromuscular re-education;Electrical Stimulation;Patient/family education;Manual techniques;Passive range of motion    PT Next Visit Plan continue to work on right LE strengthening and balance, add to HEP as indicated. Use of Bioness to right Hamstring as able    PT Home Exercise Plan Access Code: [email or web address]    Consulted and Agree with Plan of Care  Patient             Patient will benefit from skilled therapeutic intervention in order to improve the following deficits and impairments:  Abnormal gait, Difficulty walking, Decreased balance, Impaired sensation, Decreased strength, Decreased mobility  Visit Diagnosis: Muscle weakness (generalized)  Other abnormalities of gait and mobility  Unsteadiness on feet     Problem List Patient Active Problem List   Diagnosis Date Noted   Acute blood loss anemia 11/10/2020   Abnormal LFTs 11/10/2020   Aphasia 10/30/2020   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

## 2021-02-20 NOTE — Therapy (Signed)
[facility] [address] [place], [place], [address] Phone: [phone]   Fax:  [phone]  Speech Language Pathology Treatment  Patient Details  Name: Jane Martin MRN: [id number] Date of Birth: [birth year] Referring Provider (SLP): Dr. Sula Soda   Encounter Date: 02/20/2021   End of Session - 02/20/21 0808     Visit Number 17    Number of Visits 33    Date for SLP Re-Evaluation 03/18/21    Authorization Type no auth; 130 VL PT/OT/ST combined    SLP Start Time 0807   pt arrived 7 mins late   SLP Stop Time  0845    SLP Time Calculation (min) 38 min    Activity Tolerance Patient limited by lethargy             Past Medical History:  Diagnosis Date   Stroke ([facility]) 10/07/2020    Past Surgical History:  Procedure Laterality Date   CRANIOTOMY Left 10/17/2020   Procedure: CRANIOTOMY HEMATOMA Evacuation;  Surgeon: Barnett Abu, MD;  [facility];  Service: Neurosurgery;  Laterality: Left;   IR ANGIO INTRA EXTRACRAN SEL COM CAROTID INNOMINATE BILAT MOD SED  10/29/2020   IR ANGIO VERTEBRAL SEL VERTEBRAL BILAT MOD SED  10/29/2020   IR US GUIDE VASC ACCESS RIGHT  10/29/2020    There were no vitals filed for this visit.   Subjective Assessment - 02/20/21 0808     Subjective "my family is in town"    Currently in Pain? No/denies                   ADULT SLP TREATMENT - 02/20/21 0809       General Information   Behavior/Cognition Alert;Cooperative;Pleasant mood      Treatment Provided   Treatment provided Cognitive-Linquistic      Cognitive-Linquistic Treatment   Treatment focused on Aphasia;Patient/family/caregiver education    Skilled Treatment With prompting, pt verbalized she will be returning to work. Pt exhibited dysnomia x1 in opening conversation, in which pt able to self-correct with mild extra time. Some vague language noted in conversation, in which SLP requested some  clarification and provided intermittent min questioning cues to name more specific/concrete information. SLP targeted naming and typing (on Lingraphica) for work related sentences. Pt able to name and type sentences with rare min A (apostrophes in conjunctions). Dysnomia x1 while reading restaurant address, which required min SLP A due to perseverations. SLP targeted paragraph level reading, in which pt exhibited occasional self-corrections and required rare min A x3 to correct phonemic paraphasias and ID substitutions/obmissions. Pt explained errors are related to feeling nervous in therapy sessions and these errors do not occur at home.      Assessment / Recommendations / Plan   Plan Continue with current plan of care      Progression Toward Goals   Progression toward goals Progressing toward goals              SLP Education - 02/20/21 0819     Education Details Typing, HEP per last session as not completed    Person(s) Educated Patient    Methods Explanation;Demonstration    Comprehension Verbalized understanding;Returned demonstration;Need further instruction              SLP Short Term Goals - 02/18/21 1631       SLP SHORT TERM GOAL #1   Title Pt will name 10 items in persoanlly relevant category with rare min A over 2 sessions  Baseline 12-22-20, 12-31-20    Status Achieved      SLP SHORT TERM GOAL #2   Title Pt will utilize 2 compensatory strategies for aphasia in structured language tasks 8/10 opportunities with occasional min A over 2 sessions    Baseline 12-22-20; 12/29/20    Status Achieved      SLP SHORT TERM GOAL #3   Title Pt will comprehend 3 sentence paragraph with rare min A over 2 sessions    Baseline 12-25-20, 12/29/20    Status Achieved      SLP SHORT TERM GOAL #4   Title Pt will write at sentence level, IDing errors with occasional min 8/10 errors    Baseline 12-15-20, 12-31-20    Status Achieved      SLP SHORT TERM GOAL #5   Title Pt will carryover  compensations for aphasia during 12 minute simple conversation as needed with occasional min A    Baseline 12-31-20    Status Achieved              SLP Long Term Goals - 02/20/21 0815       SLP LONG TERM GOAL #1   Title Pt will complete complex naming tasks with 90% accuracy and occasional min A over 2 sessions    Time 6    Period Weeks    Status On-going      SLP LONG TERM GOAL #2   Title Pt will comprehend 3 paragraph passages of interest to her, restating 2 salient details from each paragraph with occasional min A    Baseline 01/19/21;    Time 6    Period Weeks    Status On-going      SLP LONG TERM GOAL #3   Title Pt will ID and correct errors on texts, e mails and other 4 sentence written communications 9/10 with occasional min A    Time 6    Period Weeks    Status On-going      SLP LONG TERM GOAL #4   Title Pt will carryover compensations for aphasia as needed over 20 minute moderately complex conversation with occasional min A over 2 sessions    Baseline 01-07-21    Time 6    Period Weeks    Status On-going      SLP LONG TERM GOAL #5   Title Pt will improve score on The Communicative Participation Item Bank by 4 points    Time 6    Period Weeks    Status On-going              Plan - 02/20/21 0814     Clinical Impression Statement Trevor continues to make progress with reading comprehension as well as written and verbal expression. She is Id'ing and and self correcting aphasic errors on structured speech tasks, reading, and writing with rare min A and extended time. Written expression via typing required occasional extended time for single sentences. She plans to return to work 03/08/21.  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    Speech Therapy Frequency 2x / week    Duration --   16 weeks, 33 visits   Treatment/Interventions SLP instruction and  feedback;Environmental controls;Language facilitation;Cueing hierarchy;Compensatory strategies;Functional tasks;Cognitive reorganization;Compensatory techniques;Patient/family education;Multimodal communcation approach;Internal/external aids    Potential to Achieve Goals Good    SLP Home Exercise Plan provided    Consulted and Agree with Plan of Care Patient  Patient will benefit from skilled therapeutic intervention in order to improve the following deficits and impairments:   Aphasia    Problem List Patient Active Problem List   Diagnosis Date Noted   Acute blood loss anemia 11/10/2020   Abnormal LFTs 11/10/2020   Aphasia 10/30/2020   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

## 2021-02-20 NOTE — Therapy (Signed)
[facility] [address] [place], [place], [address] Phone: [phone]   Fax:  [phone]  Occupational Therapy Treatment  Patient Details  Name: Jane Martin MRN: [id number] Date of Birth: Nov 19, 1995 Referring Provider (OT): f/u w Ihor Austin, NP   Encounter Date: 02/20/2021   OT End of Session - 02/20/21 1003     Visit Number 18    Number of Visits 28    Date for OT Re-Evaluation 04/01/21    Authorization Type [facility] [employer]MR    Authorization Time Period VL:MN    OT Start Time 0935    OT Stop Time 1015    OT Time Calculation (min) 40 min    Activity Tolerance Patient tolerated treatment well    Behavior During Therapy [facility] for tasks assessed/performed             Past Medical History:  Diagnosis Date   Stroke ([facility]) 10/07/2020    Past Surgical History:  Procedure Laterality Date   CRANIOTOMY Left 10/17/2020   Procedure: CRANIOTOMY HEMATOMA Evacuation;  Surgeon: Barnett Abu, MD;  Location: [facility] OR;  Service: Neurosurgery;  Laterality: Left;   IR ANGIO INTRA EXTRACRAN SEL COM CAROTID INNOMINATE BILAT MOD SED  10/29/2020   IR ANGIO VERTEBRAL SEL VERTEBRAL BILAT MOD SED  10/29/2020   IR US GUIDE VASC ACCESS RIGHT  10/29/2020    There were no vitals filed for this visit.   Subjective Assessment - 02/20/21 1002     Subjective  "I'm ready to go back to work"    Limitations Aphasia (expressive and receptive), Absent sensation on R hemi    Patient Stated Goals "to be able to feel myself, do for myself, drive"    Currently in Pain? No/denies                 Treatment: Functional problem solving activities: planning a breakfast for 14 people, pt required min-mod v.c to get started then she was able to correctly plan a breakfast  for 14 people with in a budget. 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  Pt requires questioning cues for attention to detail and thoroughness to complete her list of items. Increased time required and difficulty with initiation of tasks. Therapist discussed importance of double checking for thoroughness at work. Pt reports she is going to be "fine " returning to work.                   OT Short Term Goals - 02/18/21 1222       OT SHORT TERM GOAL #1   Title Pt will be independent with HEP for RUE coordination and grip strength    Time 4    Period Weeks    Status Achieved   coordination HEP, theraputty red 12/22/20   Target Date 12/22/20      OT SHORT TERM GOAL #2   Title Pt will verbalize understanding of sensory strategies for increased safety awareness    Time 4    Period Weeks    Status Achieved   reviewed sensory strategies for safety, still needing cues 12/22/20     OT SHORT TERM GOAL #3   Title Pt will perform environmental scanning in moderately distracting environment with 90% accuracy or greater.    Time 4    Period Weeks  Status Achieved   93% 01/07/21     OT SHORT TERM GOAL #4   Title Pt will improve box and blocks score by 4 blocks or more with RUE for increase in overall functional use of RUE.    Baseline R 21 L 51    Time 4    Period Weeks    Status Achieved   RUE 30 blocks 12/22/20     OT SHORT TERM GOAL #5   Title Pt will perform simple warm meal prep with supervision and good safety awareness    Time 4    Period Weeks    Status Achieved   supervision 12/22/20              OT Long Term Goals - 02/18/21 1224       OT LONG TERM GOAL #1   Title Pt will be independent with updated HEPs    Time 12    Period Weeks    Status On-going      OT LONG TERM GOAL #2   Title Pt will increase grip strength in RUE by 8 lbs or greater for increasein functional use of RUE, dominant, hand.    Baseline R 24.6, L 42.1    Time 12    Period Weeks    Status Achieved   42.6 lbs with RUE 01/19/21     OT  LONG TERM GOAL #3   Title Pt will complete 9 hole peg test in 2 minutes or less with RUE for increase in fine motor coordination    Baseline R 8 pegs in 2 min, 30 seconds L 24.63s    Time 12    Period Weeks    Status Achieved   01/19/21 RUE 45.44s     OT LONG TERM GOAL #4   Title Pt will complete physical and cognitive task simultaneously with 90% accuracy or greater    Time 12    Period Weeks    Status On-going      OT LONG TERM GOAL #5   Title Pt will demonstrate or report increased ease with styling her own hair with bilateral UE.    Time 12    Period Weeks    Status Achieved      OT LONG TERM GOAL #6   Title Pt and caregiver will verbalize undersatnding for return to driving and return to work recommendations.    Time 12    Period Weeks    Status On-going      OT LONG TERM GOAL #7   Title Pt will score a 57 or greater on FOTO at discharge.    Baseline 38    Time 12    Period Weeks    Status New                   Plan - 02/20/21 1004     Clinical Impression Statement Pt is progressing towards goals however she demonstrates decreased overall insight into deficits. Pt continues to require v.c for functional problem solving.    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 ADL;Decreased knowledge of use of DME;Strength;Dexterity;GMC;Proprioception;UE functional use;ROM;IADL;Vision;Decreased knowledge of precautions;FMC;Sensation;Coordination    Cognitive Skills Problem Solve;Understand;Perception;Thought;Sequencing    Rehab Potential Good    OT Frequency 2x / week   1x/week for 1  week and 2x/week for 5 weeks = 6 weeks   OT Duration 6 weeks   11 visits over 6 weeks at renewal   OT Treatment/Interventions Self-care/ADL  training;Fluidtherapy;DME and/or AE instruction;Therapeutic activities;Cognitive remediation/compensation;Visual/perceptual remediation/compensation;Patient/family education;Passive range of motion;Manual Therapy;Energy conservation;Electrical Stimulation;Neuromuscular education;Building services engineer;Therapeutic exercise    Plan continue to address  typing, alternating attention, in prep for return to work    [employer] and Agree with Plan of Care Patient             Patient will benefit from skilled therapeutic intervention in order to improve the following deficits and impairments:   Body Structure / Function / Physical Skills: ADL, Decreased knowledge of use of DME, Strength, Dexterity, GMC, Proprioception, UE functional use, ROM, IADL, Vision, Decreased knowledge of precautions, FMC, Sensation, Coordination Cognitive Skills: Problem Solve, Understand, Perception, Thought, Sequencing     Visit Diagnosis: Attention and concentration deficit  Other disturbances of skin sensation  Other lack of coordination  Frontal lobe and executive function deficit    Problem List Patient Active Problem List   Diagnosis Date Noted   Acute blood loss anemia 11/10/2020   Abnormal LFTs 11/10/2020   Aphasia 10/30/2020   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 [address] [place], [place], [address] Phone: [phone]   Fax:  [phone]  Name: JAYLEA PLOURDE MRN: [id number] Date of Birth: January 10, 1996

## 2021-02-23 ENCOUNTER — Ambulatory Visit: Payer: No Typology Code available for payment source | Admitting: Occupational Therapy

## 2021-02-23 ENCOUNTER — Ambulatory Visit: Payer: No Typology Code available for payment source | Admitting: Physical Therapy

## 2021-02-23 ENCOUNTER — Ambulatory Visit: Payer: No Typology Code available for payment source | Admitting: Speech Pathology

## 2021-02-24 ENCOUNTER — Encounter: Payer: 59 | Attending: Psychology | Admitting: Psychology

## 2021-02-24 ENCOUNTER — Other Ambulatory Visit: Payer: Self-pay

## 2021-02-24 DIAGNOSIS — R200 Anesthesia of skin: Secondary | ICD-10-CM | POA: Insufficient documentation

## 2021-02-24 DIAGNOSIS — I612 Nontraumatic intracerebral hemorrhage in hemisphere, unspecified: Secondary | ICD-10-CM | POA: Diagnosis not present

## 2021-02-24 DIAGNOSIS — I69390 Apraxia following cerebral infarction: Secondary | ICD-10-CM

## 2021-02-24 DIAGNOSIS — R4701 Aphasia: Secondary | ICD-10-CM

## 2021-02-25 ENCOUNTER — Ambulatory Visit: Payer: No Typology Code available for payment source | Admitting: Occupational Therapy

## 2021-02-25 ENCOUNTER — Ambulatory Visit: Payer: No Typology Code available for payment source | Admitting: Speech Pathology

## 2021-02-25 ENCOUNTER — Encounter: Payer: Self-pay | Admitting: Occupational Therapy

## 2021-02-25 ENCOUNTER — Ambulatory Visit: Payer: No Typology Code available for payment source | Admitting: Physical Therapy

## 2021-02-25 ENCOUNTER — Encounter: Payer: Self-pay | Admitting: Physical Therapy

## 2021-02-25 ENCOUNTER — Encounter: Payer: Self-pay | Admitting: Speech Pathology

## 2021-02-25 ENCOUNTER — Other Ambulatory Visit: Payer: Self-pay

## 2021-02-25 DIAGNOSIS — R4701 Aphasia: Secondary | ICD-10-CM

## 2021-02-25 DIAGNOSIS — M6281 Muscle weakness (generalized): Secondary | ICD-10-CM

## 2021-02-25 DIAGNOSIS — R2681 Unsteadiness on feet: Secondary | ICD-10-CM

## 2021-02-25 DIAGNOSIS — R208 Other disturbances of skin sensation: Secondary | ICD-10-CM

## 2021-02-25 DIAGNOSIS — I69151 Hemiplegia and hemiparesis following nontraumatic intracerebral hemorrhage affecting right dominant side: Secondary | ICD-10-CM

## 2021-02-25 DIAGNOSIS — R41844 Frontal lobe and executive function deficit: Secondary | ICD-10-CM

## 2021-02-25 DIAGNOSIS — R2689 Other abnormalities of gait and mobility: Secondary | ICD-10-CM

## 2021-02-25 DIAGNOSIS — R278 Other lack of coordination: Secondary | ICD-10-CM

## 2021-02-25 DIAGNOSIS — R41840 Attention and concentration deficit: Secondary | ICD-10-CM

## 2021-02-25 NOTE — Therapy (Signed)
[facility] [address] [place], [place], [address] Phone: [phone]   Fax:  [phone]  Occupational Therapy Treatment & Discharge  Patient Details  Name: Jane Martin MRN: [id number] Date of Birth: 28-May-1996 Referring Provider (OT): f/u w Frann Rider, NP   Encounter Date: 02/25/2021   OT End of Session - 02/25/21 1017     Visit Number 19    Number of Visits 28    Date for OT Re-Evaluation 04/01/21    Authorization Type [facility] U[facility]    Authorization Time Period VL:MN    OT Start Time 1016    OT Stop Time 1043   discharge session   OT Time Calculation (min) 27 min    Activity Tolerance Patient tolerated treatment well    Behavior During Therapy [facility] for tasks assessed/performed             Past Medical History:  Diagnosis Date   Stroke ([place]) 10/07/2020    Past Surgical History:  Procedure Laterality Date   CRANIOTOMY Left 10/17/2020   Procedure: CRANIOTOMY HEMATOMA Evacuation;  Surgeon: Kristeen Miss, MD;  Location: [place];  Service: Neurosurgery;  Laterality: Left;   IR ANGIO INTRA EXTRACRAN SEL COM CAROTID INNOMINATE BILAT MOD SED  10/29/2020   IR ANGIO VERTEBRAL SEL VERTEBRAL BILAT MOD SED  10/29/2020   IR US GUIDE VASC ACCESS RIGHT  10/29/2020    There were no vitals filed for this visit.   Subjective Assessment - 02/25/21 1017     Subjective  "i'm excited I don't have to wake up anymore"    Limitations Aphasia (expressive and receptive), Absent sensation on R hemi    Patient Stated Goals "to be able to feel myself, do for myself, drive"    Currently in Pain? No/denies               OCCUPATIONAL THERAPY DISCHARGE SUMMARY  Visits from Start of Care: 19  Current functional level related to goals / functional outcomes: Pt has progressed with functional use in RUE and increased strength and coordination.    Remaining deficits: Pt continues with difficulty with attention, problem  solving, sequencing, and awareness.   Education / Equipment: HEPs for RUE, cognitive tasks   Patient agrees to discharge. Patient goals were partially met. Patient is being discharged due to being pleased with the current functional level. Pt wishes to discharge at this time.              OT Treatments/Exercises (OP) - 02/25/21 1029       Cognitive Exercises   Attention Span Alternating Alternating Symbols with level 10 on Constant Therapy with 93% accuracy and 53.31s response time.    Attention Span Divided physical (tossing small ball between hands) and subtracting by 3's from 75. Pt with max difficulty with task.                      OT Short Term Goals - 02/18/21 1222       OT SHORT TERM GOAL #1   Title Pt will be independent with HEP for RUE coordination and grip strength    Time 4    Period Weeks    Status Achieved   coordination HEP, theraputty red 12/22/20   Target Date 12/22/20      OT SHORT TERM GOAL #2   Title Pt will verbalize understanding of sensory strategies for increased safety awareness    Time 4    Period  Weeks    Status Achieved   reviewed sensory strategies for safety, still needing cues 12/22/20     OT SHORT TERM GOAL #3   Title Pt will perform environmental scanning in moderately distracting environment with 90% accuracy or greater.    Time 4    Period Weeks    Status Achieved   93% 01/07/21     OT SHORT TERM GOAL #4   Title Pt will improve box and blocks score by 4 blocks or more with RUE for increase in overall functional use of RUE.    Baseline R 21 L 51    Time 4    Period Weeks    Status Achieved   RUE 30 blocks 12/22/20     OT SHORT TERM GOAL #5   Title Pt will perform simple warm meal prep with supervision and good safety awareness    Time 4    Period Weeks    Status Achieved   supervision 12/22/20              OT Long Term Goals - 02/25/21 1019       OT LONG TERM GOAL #1   Title Pt will be independent with  updated HEPs    Time 12    Period Weeks    Status Achieved      OT LONG TERM GOAL #2   Title Pt will increase grip strength in RUE by 8 lbs or greater for increasein functional use of RUE, dominant, hand.    Baseline R 24.6, L 42.1    Time 12    Period Weeks    Status Achieved   42.6 lbs with RUE 01/19/21     OT LONG TERM GOAL #3   Title Pt will complete 9 hole peg test in 2 minutes or less with RUE for increase in fine motor coordination    Baseline R 8 pegs in 2 min, 30 seconds L 24.63s    Time 12    Period Weeks    Status Achieved   01/19/21 RUE 45.44s     OT LONG TERM GOAL #4   Title Pt will complete physical and cognitive task simultaneously with 90% accuracy or greater    Time 12    Period Weeks    Status Not Met   max difficulty with subtraction with tossing ball/ambulating     OT LONG TERM GOAL #5   Title Pt will demonstrate or report increased ease with styling her own hair with bilateral UE.    Time 12    Period Weeks    Status Achieved      OT LONG TERM GOAL #6   Title Pt and caregiver will verbalize undersatnding for return to driving and return to work recommendations.    Time 12    Period Weeks    Status Achieved   OT has verbalized concern with attention and divided attention with driving. Pt has been cleared by MD and is driving self to therapy, etc.     OT LONG TERM GOAL #7   Title Pt will score a 57 or greater on FOTO at discharge.    Baseline 38    Time 12    Period Weeks    Status Achieved   98% - pt answered "without any difficulty" on all answers.                  Plan - 02/25/21 1037     Clinical Impression Statement Pt  has progressed with goals and has met all but 1 with divided attention. Pt is discharging from OT at this time.    OT Occupational Profile and History Problem Focused Assessment - Including review of records relating to presenting problem    Occupational performance deficits (Please refer to evaluation for details):  IADL's;ADL's;Leisure;Work    Marketing executive / Function / Physical Skills ADL;Decreased knowledge of use of DME;Strength;Dexterity;GMC;Proprioception;UE functional use;ROM;IADL;Vision;Decreased knowledge of precautions;FMC;Sensation;Coordination    Cognitive Skills Problem Solve;Understand;Perception;Thought;Sequencing    Rehab Potential Good    OT Frequency 2x / week   1x/week for 1 week and 2x/week for 5 weeks = 6 weeks   OT Duration 6 weeks   11 visits over 6 weeks at renewal   OT Treatment/Interventions Self-care/ADL training;Fluidtherapy;DME and/or AE instruction;Therapeutic activities;Cognitive remediation/compensation;Visual/perceptual remediation/compensation;Patient/family education;Passive range of motion;Manual Therapy;Energy conservation;Electrical Stimulation;Neuromuscular education;Therapist, nutritional;Therapeutic exercise    Plan OT discharge    Consulted and Agree with Plan of Care Patient             Patient will benefit from skilled therapeutic intervention in order to improve the following deficits and impairments:   Body Structure / Function / Physical Skills: ADL, Decreased knowledge of use of DME, Strength, Dexterity, GMC, Proprioception, UE functional use, ROM, IADL, Vision, Decreased knowledge of precautions, FMC, Sensation, Coordination Cognitive Skills: Problem Solve, Understand, Perception, Thought, Sequencing     Visit Diagnosis: Muscle weakness (generalized)  Frontal lobe and executive function deficit  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 [place], [place], [address] Phone: [phone]   Fax:  [phone]  Name: Jane Martin MRN: [id number] Date of Birth: 1996-05-27

## 2021-02-25 NOTE — Therapy (Signed)
[facility] [address] [place], [place], [address] Phone: [phone]   Fax:  [phone]  Physical Therapy Treatment  Patient Details  Name: Jane Martin MRN: [id number] Date of Birth: Nov 29, 1995 Referring Provider (PT): Marlowe Shores, PA-C (to see Ra[place])   Encounter Date: 02/25/2021   PT End of Session - 02/25/21 0855     Visit Number 16    Number of Visits 20    Date for PT Re-Evaluation 20/94/70   per recert 03/11/27   Authorization Type [employer]    PT Start Time 3662   pt running behind for session   PT Stop Time 0920   discharge visit, not all time was needed.   PT Time Calculation (min) 28 min    Activity Tolerance Patient tolerated treatment well    Behavior During Therapy [facility] for tasks assessed/performed             Past Medical History:  Diagnosis Date   Stroke ([place]) 10/07/2020    Past Surgical History:  Procedure Laterality Date   CRANIOTOMY Left 10/17/2020   Procedure: CRANIOTOMY HEMATOMA Evacuation;  Surgeon: Kristeen Miss, MD;  Location: [place];  Service: Neurosurgery;  Laterality: Left;   IR ANGIO INTRA EXTRACRAN SEL COM CAROTID INNOMINATE BILAT MOD SED  10/29/2020   IR ANGIO VERTEBRAL SEL VERTEBRAL BILAT MOD SED  10/29/2020   IR US GUIDE VASC ACCESS RIGHT  10/29/2020    There were no vitals filed for this visit.   Subjective Assessment - 02/25/21 0854     Subjective No new complaints. No falls or pain to report.    Pertinent History No significant PMH    Currently in Pain? No/denies    Pain Score 0-No pain                [facility] PT Assessment - 02/25/21 0857       6 Minute Walk- Baseline   6 Minute Walk- Baseline yes    BP (mmHg) 113/78    HR (bpm) 85    02 Sat (%RA) 97 %    Modified Borg Scale for Dyspnea 0- Nothing at all    Perceived Rate of Exertion (Borg) 6-      6 Minute walk- Post Test   6 Minute Walk Post Test yes    BP (mmHg) 118/68    02 Sat (%RA) 100 %     Modified Borg Scale for Dyspnea 0- Nothing at all    Perceived Rate of Exertion (Borg) 6-      6 minute walk test results    Aerobic Endurance Distance Walked 1747    Endurance additional comments no AD or rest breaks taken                  [facility] Adult PT Treatment/Exercise - 02/25/21 0912       Transfers   Transfers Sit to Stand;Stand to Sit    Stand to Sit 6: Modified independent (Device/Increase time);Without upper extremity assist;To chair/3-in-1      Ambulation/Gait   Ambulation/Gait Yes    Ambulation/Gait Assistance 7: Independent    Ambulation Distance (Feet) --   aound clinic with session   Assistive device None    Gait Pattern Within Functional Limits    Ambulation Surface Level;Indoor      Self-Care   Self-Care Other Self-Care Comments    Other Self-Care Comments  Reviewed HEP. upgraded bands to blue with a silver provided for when blue gets easy;  discussed community fitness options as well. Pt plans to join gym to work with a trainer to continued to get stronger. Waiting to get her work schedule set before doing this.                  PT Short Term Goals - 02/05/21 1928       PT SHORT TERM GOAL #1   Title STGs = LTGs               PT Long Term Goals - 02/25/21 0855       PT LONG TERM GOAL #1   Title Pt will be independent with final progression of HEP for improved lower extremity strength  TARGET 03/06/2021    Baseline 02/25/21: met with current program, advanced HEP this session    Time --    Period --    Status Achieved      PT LONG TERM GOAL #2   Title Pt will improve distance in 6 MWT to at least 1700 ft for improved gait efficiency and endurance.    Baseline 02/25/21: 1747 feet today with no AD/rest breaks    Time --    Period --    Status Achieved      PT LONG TERM GOAL #3   Title Pt will verbalize plans for continued community fitness to maximize gains made in rehab.    Baseline 02/25/21: pt stated she plans to join a gym to  work with a trainer after getting her work schedule established    Time --    Period --    [place] - 02/25/21 0855     Clinical Impression Statement Today's skilled session focused on progress toward LTGs with all goals met. Pt agreeable to discharge today.    Personal Factors and Comorbidities Other   No pertient PMH; severity of deficits, including aphasia   Examination-Activity Limitations Locomotion Level;Transfers;Stairs    Examination-Participation Restrictions Occupation;Community Activity;Driving    Stability/Clinical Decision Making Evolving/Moderate complexity    Rehab Potential Good    PT Frequency 2x / week    PT Duration 4 weeks   including recert week 0/3/49   PT Treatment/Interventions ADLs/Self Care Home Management;Gait training;Stair training;Functional mobility training;Therapeutic activities;Therapeutic exercise;Balance training;Neuromuscular re-education;Electrical Stimulation;Patient/family education;Manual techniques;Passive range of motion    PT Next Visit Plan discharge    PT Home Exercise Plan Access Code: [email or web address]    Consulted and Agree with Plan of Care Patient             Patient will benefit from skilled therapeutic intervention in order to improve the following deficits and impairments:  Abnormal gait, Difficulty walking, Decreased balance, Impaired sensation, Decreased strength, Decreased mobility  Visit Diagnosis: Muscle weakness (generalized)  Other abnormalities of gait and mobility     Problem List Patient Active Problem List   Diagnosis Date Noted   Acute blood loss anemia 11/10/2020   Abnormal LFTs 11/10/2020   Aphasia 10/30/2020   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

## 2021-02-25 NOTE — Therapy (Signed)
[facility] [address] [place], [place], [address] Phone: [phone]   Fax:  [phone]  Speech Language Pathology Treatment & Discharge Summary  Patient Details  Name: Jane Martin MRN: [id number] Date of Birth: [birth year] Referring Provider (SLP): Dr. Leeroy Cha   Encounter Date: 02/25/2021   End of Session - 02/25/21 1204     Visit Number 18    Number of Visits 33    Date for SLP Re-Evaluation 03/18/21    Authorization Type no auth; 130 VL PT/OT/ST combined    SLP Start Time 0941    SLP Stop Time  1015    SLP Time Calculation (min) 34 min    Activity Tolerance Patient tolerated treatment well             Past Medical History:  Diagnosis Date   Stroke ([place]) 10/07/2020    Past Surgical History:  Procedure Laterality Date   CRANIOTOMY Left 10/17/2020   Procedure: CRANIOTOMY HEMATOMA Evacuation;  Surgeon: Kristeen Miss, MD;  Location: [facility];  Service: Neurosurgery;  Laterality: Left;   IR ANGIO INTRA EXTRACRAN SEL COM CAROTID INNOMINATE BILAT MOD SED  10/29/2020   IR ANGIO VERTEBRAL SEL VERTEBRAL BILAT MOD SED  10/29/2020   IR US GUIDE VASC ACCESS RIGHT  10/29/2020    There were no vitals filed for this visit.   Subjective Assessment - 02/25/21 0936     Subjective Pt arrived 11 minutes late as she left after checking in to go get breakfast    Currently in Pain? No/denies                   ADULT SLP TREATMENT - 02/25/21 0941       General Information   Behavior/Cognition Alert;Cooperative;Pleasant mood      Treatment Provided   Treatment provided Cognitive-Linquistic      Cognitive-Linquistic Treatment   Treatment focused on Aphasia;Patient/family/caregiver education    Skilled Treatment Pt missed last session due to being tired. She did not complete HW of emailing me summaries of  articles she read. Today, checked goals. Jane Martin is completing complex naming tasks with 90%  accuracy. She wrote 7 sentence summary of her day with extended time and rare min A. She raised her score on Communicatoin Participation Item Bank from 14 to 28, with noting a lititle difficulty remaining giving detailed information and participating in fast moving conversation. Reading comprehension at 3 paragraphs 100% accurate      Assessment / Recommendations / Plan   Plan Discharge SLP treatment due to (comment)      Progression Toward Goals   Progression toward goals Goals met, education completed, patient discharged from [facility] Education - 02/25/21 1159     Education Details [employer] aphasia study for mild aphasia with reading and writing    Person(s) Educated Patient    Methods Handout;Explanation    Comprehension Verbalized understanding             SPEECH THERAPY DISCHARGE SUMMARY  Visits from Start of Care: 18  Current functional level related to goals / functional outcomes: See goals below   Remaining deficits:  Mild high level aphasia   Education / Equipment: Compensations for aphasia, compensations for written expression and reading comprehension   Patient agrees to discharge. Patient goals were met. Patient is being discharged due to meeting the stated rehab goals.[facility] [place]      SLP  Short Term Goals - 02/25/21 1203       SLP SHORT TERM GOAL #1   Title Pt will name 10 items in persoanlly relevant category with rare min A over 2 sessions    Baseline 12-22-20, 12-31-20    Status Achieved      SLP SHORT TERM GOAL #2   Title Pt will utilize 2 compensatory strategies for aphasia in structured language tasks 8/10 opportunities with occasional min A over 2 sessions    Baseline 12-22-20; 12/29/20    Status Achieved      SLP SHORT TERM GOAL #3   Title Pt will comprehend 3 sentence paragraph with rare min A over 2 sessions    Baseline 12-25-20, 12/29/20    Status Achieved      SLP SHORT TERM GOAL #4   Title Pt will write at sentence level, IDing errors  with occasional min 8/10 errors    Baseline 12-15-20, 12-31-20    Status Achieved      SLP SHORT TERM GOAL #5   Title Pt will carryover compensations for aphasia during 12 minute simple conversation as needed with occasional min A    Baseline 12-31-20    Status Achieved              SLP Long Term Goals - 02/25/21 1203       SLP LONG TERM GOAL #1   Title Pt will complete complex naming tasks with 90% accuracy and occasional min A over 2 sessions    Time 6    Period Weeks    Status Achieved      SLP LONG TERM GOAL #2   Title Pt will comprehend 3 paragraph passages of interest to her, restating 2 salient details from each paragraph with occasional min A    Baseline 01/19/21;    Time 6    Period Weeks    Status Achieved      SLP LONG TERM GOAL #3   Title Pt will ID and correct errors on texts, e mails and other 4 sentence written communications 9/10 with occasional min A    Time 6    Period Weeks    Status Achieved      SLP LONG TERM GOAL #4   Title Pt will carryover compensations for aphasia as needed over 20 minute moderately complex conversation with occasional min A over 2 sessions    Baseline 01-07-21    Time 6    Period Weeks    Status Achieved      SLP LONG TERM GOAL #5   Title Pt will improve score on The Communicative Participation Item Bank by 4 points    Time 6    Period Weeks    Status Achieved              Plan - 02/25/21 1200     Clinical Impression Statement Jane Martin has met goals for word finding, reading and writing. She continues to require extended time for reading comprehension and written expression howver she corrects errors and re-reads spontaneously if needed. She reports this continues to improve. Pateint reported outcome measure significanlty improved (see Skilled Intervention) Jane Martin is pleased with her current level of function. D/c ST at this time - she is in agreement    Speech Therapy Frequency 2x / week    Duration --   16 weeks    Treatment/Interventions SLP instruction and feedback;Environmental controls;Language facilitation;Cueing hierarchy;Compensatory strategies;Functional tasks;Cognitive reorganization;Compensatory techniques;Patient/family education;Multimodal communcation approach;Internal/external aids    Potential   to Achieve Goals Good             Patient will benefit from skilled therapeutic intervention in order to improve the following deficits and impairments:   Aphasia    Problem List Patient Active Problem List   Diagnosis Date Noted   Acute blood loss anemia 11/10/2020   Abnormal LFTs 11/10/2020   Aphasia 10/30/2020   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  

## 2021-03-03 ENCOUNTER — Encounter: Payer: Self-pay | Admitting: Physical Therapy

## 2021-03-03 NOTE — Therapy (Signed)
[facility] [address] [place], [place], [address] Phone: [phone]   Fax:  [phone]  Patient Details  Name: Jane Martin MRN: [id number] Date of Birth: [birth year] Referring Provider:  No ref. provider found  Encounter Date: 03/03/2021  PHYSICAL THERAPY DISCHARGE SUMMARY  Visits from Start of Care: 16  Current functional level related to goals / functional outcomes:  PT Long Term Goals - 02/25/21 0855       PT LONG TERM GOAL #1   Title Pt will be independent with final progression of HEP for improved lower extremity strength  TARGET 03/06/2021    Baseline 02/25/21: met with current program, advanced HEP this session    Time --    Period --    Status Achieved      PT LONG TERM GOAL #2   Title Pt will improve distance in 6 MWT to at least 1700 ft for improved gait efficiency and endurance.    Baseline 02/25/21: 1747 feet today with no AD/rest breaks    Time --    Period --    Status Achieved      PT LONG TERM GOAL #3   Title Pt will verbalize plans for continued community fitness to maximize gains made in rehab.    Baseline 02/25/21: pt stated she plans to join a gym to work with a trainer after getting her work schedule established    Time --    Period --    Status Achieved            Pt has met 3 of 3 LTGs.   Remaining deficits: Decreased sensation, high level strength deficits   Education / Equipment: Educated in [employer], progression and community fitness   Patient agrees to discharge. Patient goals were met. Patient is being discharged due to meeting the stated rehab goals.   Frazier Butt. 03/03/2021, 2:38 PM Frazier Butt., PT  [facility] [address] [place], [place], [address] Phone: [phone]   Fax:  [phone]

## 2021-03-18 ENCOUNTER — Other Ambulatory Visit: Payer: Self-pay

## 2021-03-18 ENCOUNTER — Encounter: Payer: No Typology Code available for payment source | Attending: Psychology | Admitting: Psychology

## 2021-03-18 DIAGNOSIS — I69390 Apraxia following cerebral infarction: Secondary | ICD-10-CM

## 2021-03-18 DIAGNOSIS — I612 Nontraumatic intracerebral hemorrhage in hemisphere, unspecified: Secondary | ICD-10-CM | POA: Insufficient documentation

## 2021-03-18 DIAGNOSIS — R200 Anesthesia of skin: Secondary | ICD-10-CM | POA: Insufficient documentation

## 2021-03-18 DIAGNOSIS — R4701 Aphasia: Secondary | ICD-10-CM | POA: Diagnosis not present

## 2021-03-25 ENCOUNTER — Other Ambulatory Visit: Payer: Self-pay

## 2021-03-25 ENCOUNTER — Ambulatory Visit (INDEPENDENT_AMBULATORY_CARE_PROVIDER_SITE_OTHER): Payer: 59 | Admitting: Adult Health

## 2021-03-25 ENCOUNTER — Encounter: Payer: Self-pay | Admitting: Adult Health

## 2021-03-25 VITALS — BP 102/70 | HR 83 | Ht 64.0 in | Wt 159.0 lb

## 2021-03-25 DIAGNOSIS — R448 Other symptoms and signs involving general sensations and perceptions: Secondary | ICD-10-CM

## 2021-03-25 DIAGNOSIS — I619 Nontraumatic intracerebral hemorrhage, unspecified: Secondary | ICD-10-CM

## 2021-03-25 NOTE — Progress Notes (Signed)
I agree with the above plan 

## 2021-03-25 NOTE — Patient Instructions (Signed)
No changes today - keep up the good work!!      Followup in the future with me in 6 months or call earlier if needed       Thank you for coming to see [place] at [facility]. I hope we have been able to provide you high quality care today.  You may receive a patient satisfaction survey over the next few weeks. We would appreciate your feedback and comments so that we may continue to improve ourselves and the health of our patients.

## 2021-03-25 NOTE — Progress Notes (Signed)
[facility] [address] [place]. 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  She is unaccompanied today.  Overall stable.  Continues to experience numbness on right side without much improvement but has been adjusting well. She has since returned back to work over the past month on light duty without difficulty. Occasional word finding issues but able to correct herself.  Denies residual bleeding or processing difficulties.  Denies new stroke/TIA symptoms.  She questions if she can return back to drinking alcohol.  No further concerns at this time.     History provided for reference purposes only Initial visit 12/23/2020 JM: Jane Martin is being seen for hospital follow-up accompanied by her mother.  She reports residual right-sided numbness/tingling, mild right-sided weakness, speech difficulty and reading difficulty and processing.  Denies residual dysphagia.  She continues to work with neuro rehab PT/OT/SLP with continued recovery.  She is able to maintain ADLs independently and has been working with OT relearning IADLs.  She is currently living with her mother which was her living situation prior.  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  Denies new or worsening stroke/TIA symptoms.  She has been seen by Dr. Danielle Dess since discharge and has follow-up appointment in October with plans on repeating MRI per mother (unable to personally view via epic).  Follows with Dr. Kieth Brightly with plans on neurocognitive evaluation in  July.  Has not yet had follow-up with Dr. Corliss Skains.  She has since completely stopped vaping.  Denies any EtOH or drug use.  Denies use of birth control even prior to recent ICH.  No further concerns at this time.  Stroke admission 10/17/2020 [name] is a 25 y.o. female with no history, presented to ED on 10/17/2020 by EMS after collapsing at a gas station. It was told that she yelled out for help and for someone to call 911, then collapsed to the ground where she was found to be unresponsive with fixed gaze, right hemiplegia and agonal breathing. When EMS arrived, she was unresponsive which worsened to comatose requiring intubation in the ED.  Personally reviewed hospitalization pertinent progress notes, lab work and imaging with summary provided.  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  Placed on Keppra s/p craniotomy for seizure prevention and eventually discontinued on 5/3.  Cerebral angiogram showed no evidence of AVM, aneurysm or stenosis did show some irregularity of annular branch of left-MCA M4 region question due to mass defect or spasms.  Dr. Danielle Dess noted a "small cluster tissue found that was cauterized and removed in a singular piece. After this it seemed that the hemorrhage subsided considerably".  Pathology showed benign brain parenchyma.  Plan to follow with Dr. Corliss Skains outpatient 6 weeks postprocedure.  Other stroke risk factors include use of birth control, questionable hypertension, THC, COVID-positive, and family history of stroke.  Hospital course complicated by acute blood loss anemia, asymptomatic  bradycardia, agitation and urinary retention.  Residual deficits of expressive > receptive aphasia, right-sided weakness and dysphagia.  Discharged to CIR on 4/28 - 5/5.      ROS:   14 system review of systems performed and negative with exception of those listed in HPI  PMH:  Past Medical History:  Diagnosis  Date   Stroke (HCC) 10/07/2020    PSH:  Past Surgical History:  Procedure Laterality Date   CRANIOTOMY Left 10/17/2020   Procedure: CRANIOTOMY HEMATOMA Evacuation;  Surgeon: Barnett Abu, MD;  Location: [facility] OR;  Service: Neurosurgery;  Laterality: Left;   IR ANGIO INTRA EXTRACRAN SEL COM CAROTID INNOMINATE BILAT MOD SED  10/29/2020   IR ANGIO VERTEBRAL SEL VERTEBRAL BILAT MOD SED  10/29/2020   IR US GUIDE VASC ACCESS RIGHT  10/29/2020    Social History:  Social History   Socioeconomic History   Marital status: Single    Spouse name: Not on file   Number of children: Not on file   Years of education: Not on file   Highest education level: Not on file  Occupational History   Not on file  Tobacco Use   Smoking status: Never   Smokeless tobacco: Never  Vaping Use   Vaping Use: Never used  Substance and Sexual Activity   Alcohol use: Yes   Drug use: Yes    Types: Marijuana   Sexual activity: Yes    Birth control/protection: None  Other Topics Concern   Not on file  Social History Narrative   Not on file   Social Determinants of Health   Financial Resource Strain: Not on file  Food Insecurity: Not on file  Transportation Needs: Not on file  Physical Activity: Not on file  Stress: Not on file  Social Connections: Not on file  Intimate Partner Violence: Not on file    Family History:  Family History  Problem Relation Age of Onset   Hypertension Mother    Intracerebral hemorrhage Mother        due to HTN   Healthy Father     Medications:   Current Outpatient Medications on File Prior to Visit  Medication Sig Dispense Refill   acetaminophen (TYLENOL) 325 MG tablet Take 2 tablets (650 mg total) by mouth every 6 (six) hours as needed for mild pain (or temp > 37.5 C (99.5 F)).     artificial tears (LACRILUBE) OINT ophthalmic ointment Place 1 application into both eyes as needed (apply to protect eyes from lash extensions).     fluconazole (DIFLUCAN) 150 MG tablet Take  1 tablet (150 mg total) by mouth daily. Take 1 tablet by mouth, if symptoms persist after 72 hours can take the second tablet 2 tablet 0   No current facility-administered medications on file prior to visit.    Allergies:   Allergies  Allergen Reactions   Flagyl [Metronidazole]       OBJECTIVE:  Physical Exam  Vitals:   03/25/21 1016  BP: 102/70  Pulse: 83  Weight: 159 lb (72.1 kg)  Height: 5\' 4"  (1.626 m)    Body mass index is 27.29 kg/m. No results found.  General: well developed, well nourished, very pleasant young African-American female, seated, in no evident distress Head: head normocephalic and atraumatic.   Neck: supple with no carotid or supraclavicular bruits Cardiovascular: regular rate and rhythm, no murmurs Musculoskeletal: no deformity Skin:  no rash/petichiae Vascular:  Normal pulses all extremities   Neurologic Exam Mental Status: Awake and fully alert.  Occasional word finding difficulty.  Occasional difficulty following simple step commands.  Oriented to place and time. Recent and remote memory intact. Attention span, concentration and fund of knowledge appropriate during visit. Mood and affect appropriate.  Cranial Nerves: Pupils equal, briskly reactive to light. Extraocular movements full without nystagmus. Visual fields full to confrontation. Hearing intact. Facial sensation intact. Face, tongue, palate moves normally and symmetrically.  Motor: Normal bulk and tone. Normal strength in all tested extremity muscles except slight decreased right hand dexterity Sensory.:  Minimal sensation right upper and lower extremity compared to left side Coordination: Rapid alternating movements normal in all extremities except slightly decreased right hand. Finger-to-nose and heel-to-shin right-sided apraxia Gait and Station: Arises from chair without difficulty. Stance is normal. Gait demonstrates normal stride length and balance without use of assistive device.  Tandem walk and heel toe without difficulty.  Romberg negative Reflexes: 1+ and symmetric. Toes downgoing.         ASSESSMENT: Jane Martin is a 25 y.o. 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 Vascular risk factors include birth control use, questionable HTN, COVID-positive, vaping and family history of stroke.      PLAN:  Left parietal ICH:  Residual deficit: Right hemisensory impairment, right-sided apraxia, and occasional aphasia but overall greatly improved.  Encouraged continued HEP.  Discussed safety precautions regarding sensory deficit on right side No prior history of ischemic stroke or extensive vascular risk factors -no indication of initiating aspirin and discussed importance of avoiding aspirin containing products and NSAIDs F/u with Dr. Danielle Dess in 04/2021 for repeat MRI brain Repeat CTA 02/05/2021 no evidence of atrial abnormality or evidence of aneurysm, AVM findings to suggest RCVS or other findings that would suggest cause of known IPH Discussed secondary stroke prevention measures and importance of close PCP follow up for aggressive stroke risk factor management      Follow up in 6 months or call earlier if needed   CC:  [facility] provider: Dr. Pearlean Brownie PCP: Obgyn, Ma Hillock    I spent 32 minutes of face-to-face and non-face-to-face time with patient.  This included previsit chart review, lab review, study review, electronic health record documentation, and patient discussion and education regarding prior stroke of unknown etiology, secondary stroke prevention measures and importance of routine follow-up with other providers, residual deficits and answered all other questions to patients satisfaction  Ihor Austin, [facility]  [facility] [address] [place], [place] [address]  Phone [phone] Fax [phone] Note: This  document was prepared with digital dictation and possible smart phrase technology. Any transcriptional errors that result from this process are unintentional.

## 2021-05-07 ENCOUNTER — Ambulatory Visit: Payer: 59 | Admitting: Psychology

## 2021-05-08 ENCOUNTER — Other Ambulatory Visit (HOSPITAL_COMMUNITY): Payer: Self-pay

## 2021-05-08 MED ORDER — VALACYCLOVIR HCL 500 MG PO TABS
500.0000 mg | ORAL_TABLET | Freq: Two times a day (BID) | ORAL | 5 refills | Status: DC
  Filled 2021-05-08 – 2022-01-25 (×3): qty 6, 3d supply, fill #0
  Filled 2022-01-27 – 2022-02-19 (×2): qty 6, 3d supply, fill #1
  Filled 2022-03-01: qty 6, 3d supply, fill #2
  Filled 2022-03-15: qty 6, 3d supply, fill #3
  Filled 2022-03-22: qty 6, 3d supply, fill #4

## 2021-05-11 ENCOUNTER — Ambulatory Visit: Payer: 59 | Admitting: Psychology

## 2021-05-24 ENCOUNTER — Encounter: Payer: Self-pay | Admitting: Psychology
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
## 2021-05-24 NOTE — Progress Notes (Signed)
03/18/2021 3 PM-4 PM:  Today's visit was an in person visit that was conducted in my outpatient clinic office with the patient and her family present.  The patient acknowledged the frustration she had doing the neuropsychological assessment and we talked about effort level etc.  We also provided feedback regarding the limited results of the neuropsychological evaluation Magaly and is much information out as possible regarding the formal testing as well as subjective reports by the patient.  The patient clearly is continuing to have significant cognitive deficits and somatosensory deficits as well.  There is some degree of lateralization and her findings but it is difficult to derive a complete assessment as the patient's effort and focus waned considerably during the evaluation.  Below you will find a copy of the summary and impressions from the neuropsychological evaluation.  It can be found in the patient's EMR in its entirety dated 02/24/2021.    Impression/Diagnosis:                     Overall, the results of the current neuropsychological evaluation are very difficult to confidently interpret.  The patient appeared to give adequate effort during the beginning of the assessment.  Which lasted for 4 hours.  However, she increasingly was frustrated with her difficulty holding a pencil and performing motor tasks, having word finding difficulties and becoming increasingly disinterested in paying more attention to her self on the in the testing assessment.  Essentially, almost every domain assessed was significantly impaired relative to a normative population and significantly impaired relative to estimations of premorbid functioning.  The patient clearly shows fine motor control deficits for her dominant right hand, likely related to sensorimotor deficits.  The patient shows significant deficits for verbal comprehension and expressive language functions, significant attentional deficits particularly for  auditory encoding, significant memory deficits with memory deficits greater for auditory memory versus visual memory and significant auditory and visual encoding deficits.  The pattern of strengths and weaknesses would be consistent with greater left hemisphere involvement.  The patient is aware and clearly frustrated by her difficulties and likely is utilizing some coping mechanisms to avoid situations that she has great difficulty with.  Left parietal involvement is clear from the assessment but the degree of deficits is very difficult to pin down and hypothesize.  As far as diagnostic information, the patient has suffered a cerebrovascular event primarily in the left parietal region easily explaining her somatosensory changes.  How much of her deficits were due to localized injury versus changes from the cerebrovascular bleed impacting more areas of the brain is hard to express.  The patient has demonstrated difficulties with full awareness of her resulting cognitive deficits, which is also consistent with parietal involvement in her cerebrovascular event.  I do think that the patient really does need to be engaged in therapeutic interventions both PT and OT as well as psychotherapeutic interventions to help her gain a greater understanding and motivate greater coping mechanisms.  I do think that down the road it would be helpful to have another neuropsychological evaluation if the patient is willing to put forth greater effort throughout the testing session so we can be more confident in the validity of the results.  The patient is clearly frustrated by her deficits and aware of them occurring particularly around language and motor/somatosensory functions.  She is unrealistic at this point as far as her ability to return to work which is exacerbating her frustration further.   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 Clinical Neuropsychologist

## 2021-06-06 IMAGING — CT CT CERVICAL SPINE W/O CM
5 of 8 series · 14 of 33 positions shown, 15 images · non-contrast
Comparison: None.

CLINICAL DATA: Patient is unresponsive and with abnormal posture

EXAM:
CT HEAD WITHOUT CONTRAST
CT CERVICAL SPINE WITHOUT CONTRAST
TECHNIQUE: Multidetector CT imaging of the head and cervical spine was
performed following the standard protocol without intravenous
contrast. Multiplanar CT image reconstructions of the cervical spine
were also generated.

[Series 6: head bone · axial · 0.41mm/px · z∈[+1136,+1192]mm · 2 of 85 slices shown]
[im 29/85  bone]
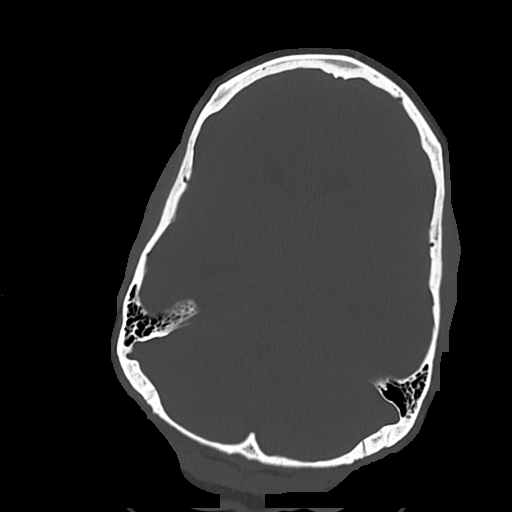
[im 57/85  bone]
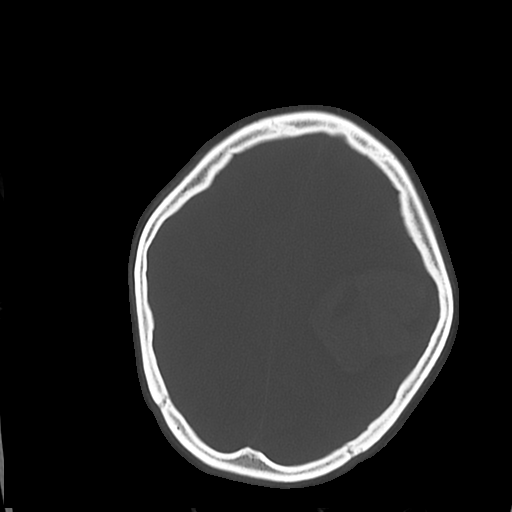

[Series 9: c spine soft · axial · 0.36mm/px · z∈[+1014,+1078]mm · 2 of 97 slices shown]
[im 33/97  soft-tissue]
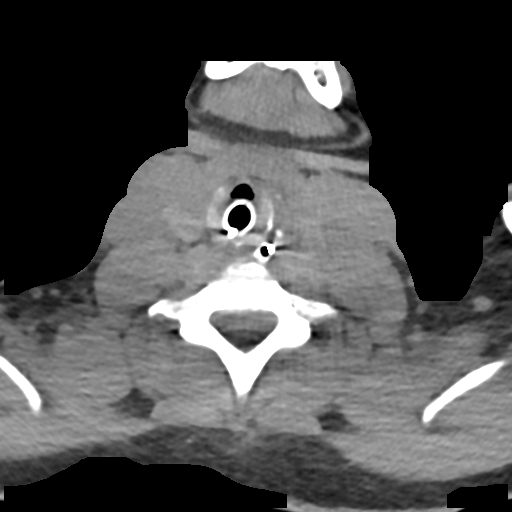
[im 65/97  soft-tissue]
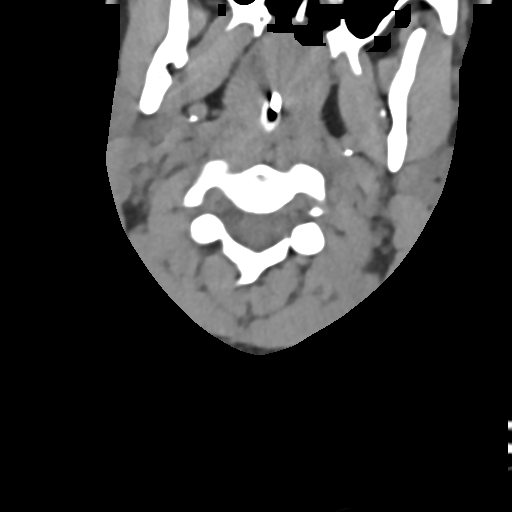

[Series 12: sag bone · sagittal · 0.24mm/px · 5 of 73 slices shown]
[im 13/73  bone]
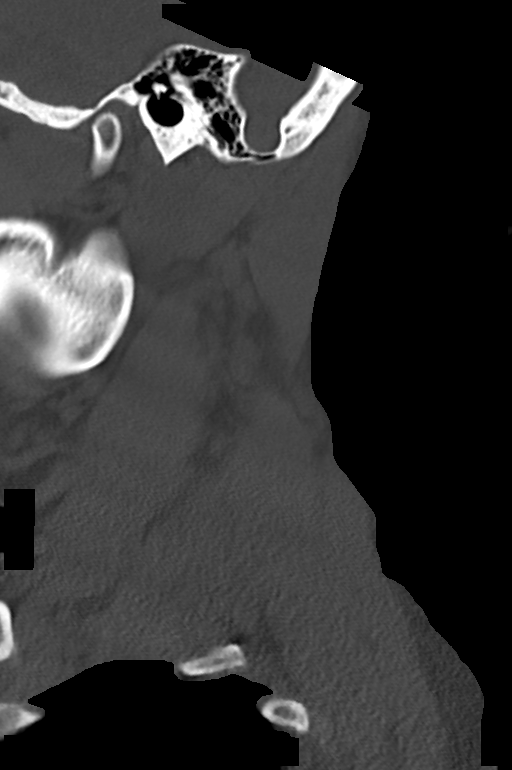
[im 25/73  bone]
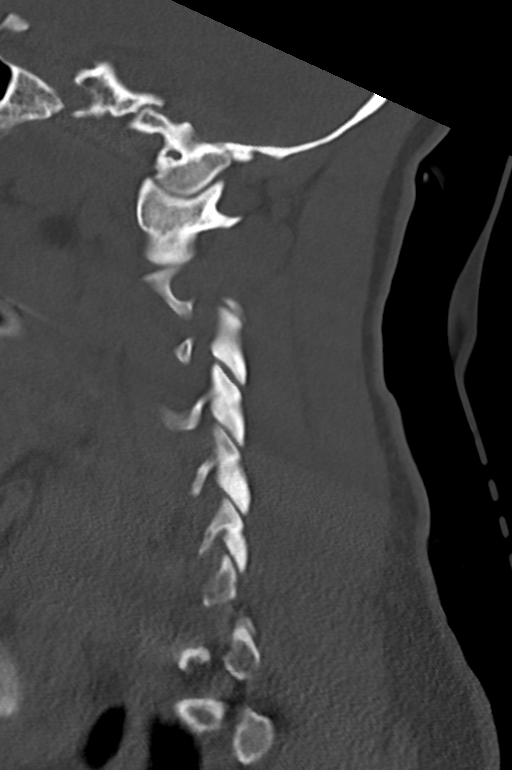
[im 37/73  bone]
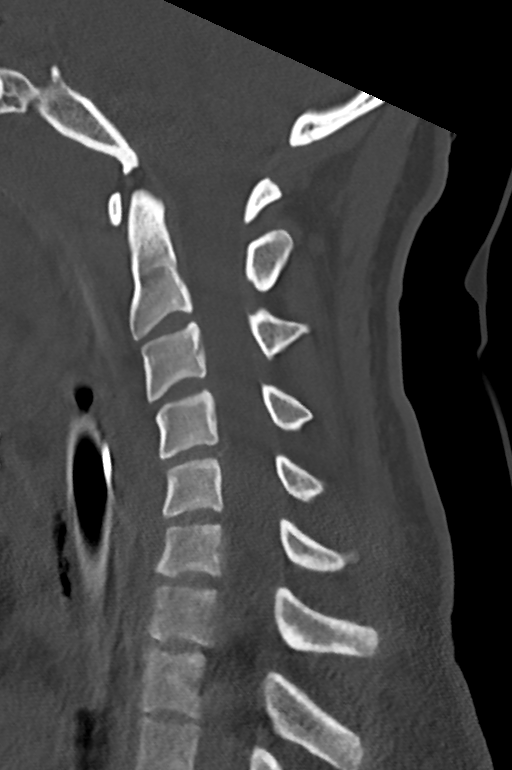
[im 49/73  bone]
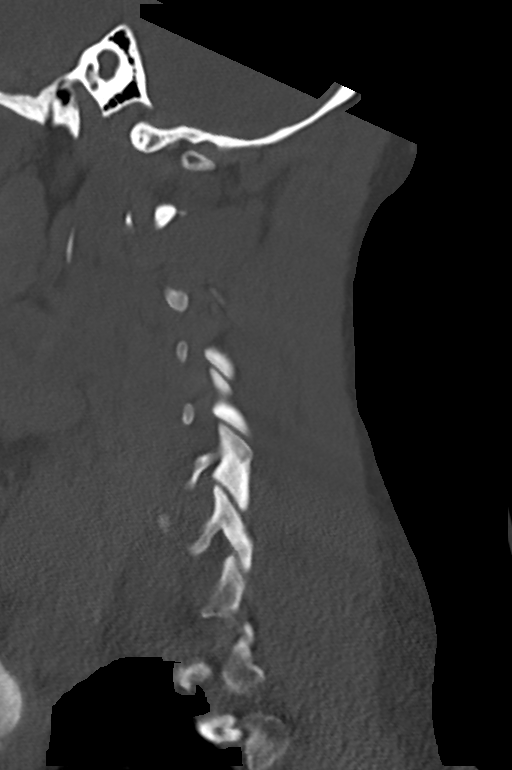
[im 61/73  bone]
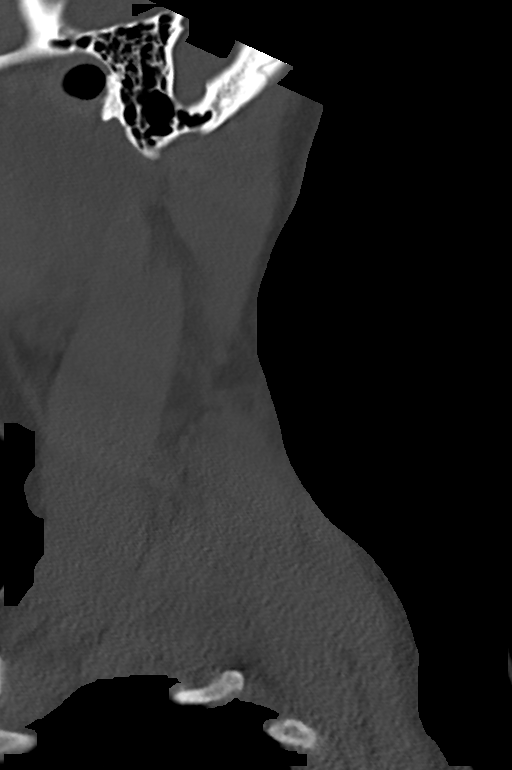

[Series 13: cor bone · coronal · 0.38mm/px · 3 of 147 slices shown]
[im 37/147  bone]
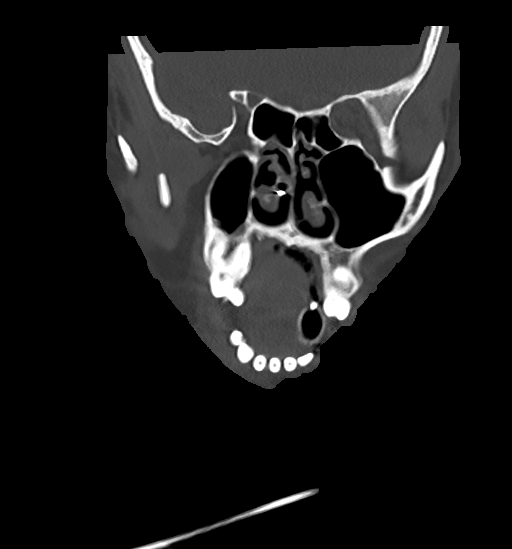
[im 74/147  bone]
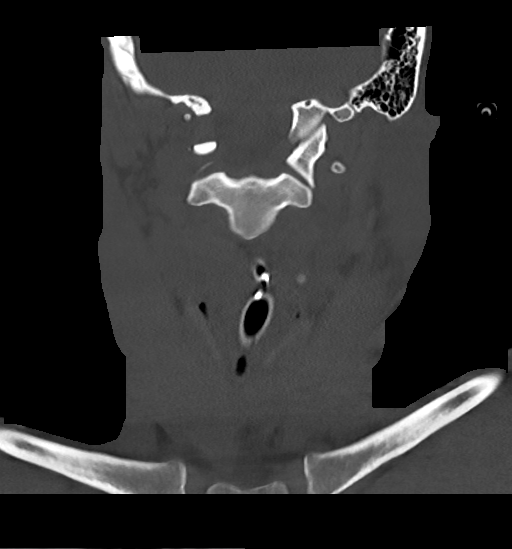
[im 110/147  bone]
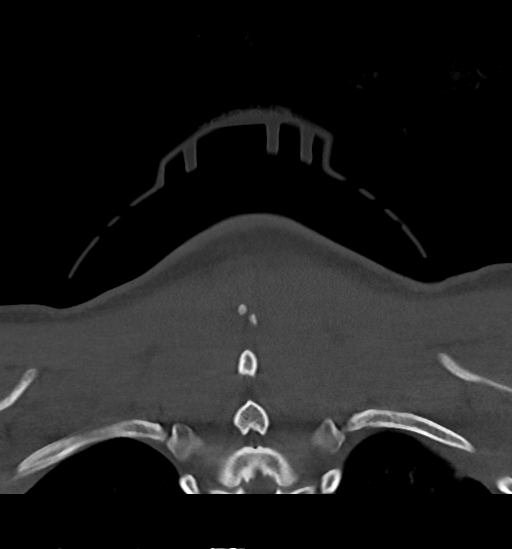

[Series 14: orthogonal axials · axial · 0.21mm/px · z∈[+994,+1047]mm · 2 of 98 slices shown, 3 images]
[im 33/98  soft-tissue]
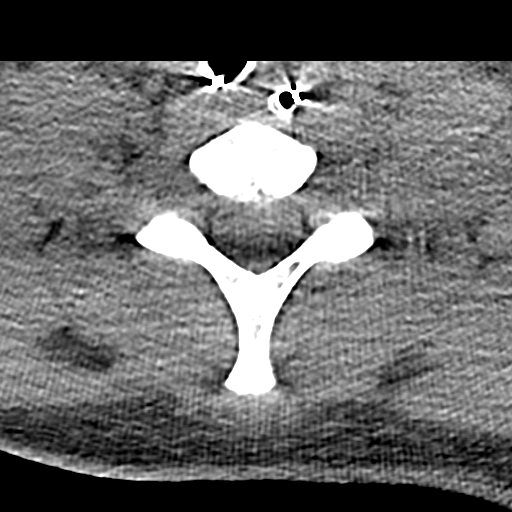
[im 33/98  bone]
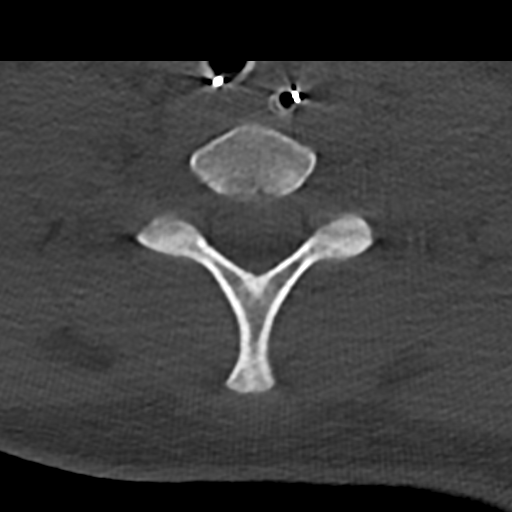
[im 65/98  bone]
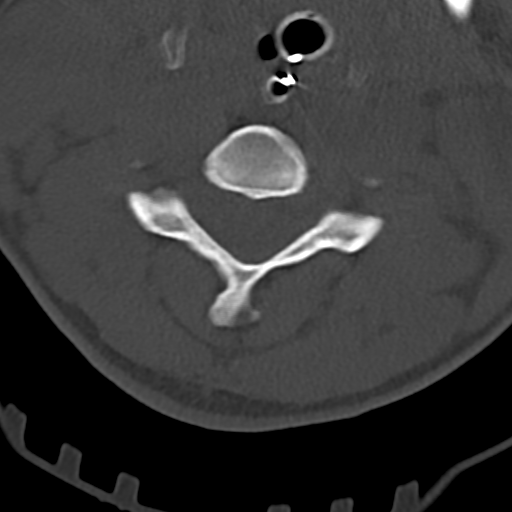

[14 of 33 positions shown; findings below may reference images not displayed]

FINDINGS: CT HEAD FINDINGS

Brain: 5.6 by 3.8 by 4.4 cm (volume = 49 cm^3) acute left
frontoparietal intraparenchymal hematoma is noted. This is
decompressed into the left lateral ventricle which likewise contains
a substantial amount of acute blood products which also extend into
the third ventricle. There is approximately 1.0 cm of left-to-right
midline shift. Borderline accentuated density along the tentorium
and falx but overall I am skeptical that this represents
subarachnoid hemorrhage.

Effacement of the basilar cisterns including the suprasellar cistern
and ambient cisterns.

Vascular: Unremarkable

Skull: Unremarkable

Sinuses/Orbits: Mild chronic right maxillary sinusitis.

CT CERVICAL SPINE FINDINGS

Alignment: Reversal the normal cervical lordosis but no subluxation.

Skull base and vertebrae: No fracture or acute bony findings.

Soft tissues and spinal canal: Unremarkable

Disc levels:  No bony impingement.

Upper chest: Unremarkable

Other: Endotracheal and nasogastric tubes noted.
IMPRESSION: 1. 5.6 cm in long axis acute left frontoparietal intraparenchymal
hematoma, decompressing into the left lateral ventricle which
contains a substantial amount of acute blood products which extend
into the third ventricle. 1.0 cm of left-to-right midline shift with
effacement of the basilar cisterns.
2. No acute cervical spine findings.
3. Chronic right maxillary sinusitis.

Critical Value/emergent results were called by telephone at the time
of interpretation on 10/17/2020 at [DATE] to provider Dr. MTAMBALA
SHALLEY , who verbally acknowledged these results.

## 2021-06-06 IMAGING — CT CT CODE STROKE CTA
3 of 7 series · 10 of 36 positions shown · IV contrast (APPLIED)
Comparison: CT head/cervical spine performed earlier today
10/17/2020.

CLINICAL DATA: Patient unresponsive with abnormal posture.

EXAM:
CT HEAD WITHOUT CONTRAST
CT ANGIOGRAPHY HEAD AND NECK
TECHNIQUE: Multidetector CT imaging of the head and neck was performed using
the standard protocol during bolus administration of intravenous
contrast. Multiplanar CT image reconstructions and MIPs were
obtained to evaluate the vascular anatomy. Carotid stenosis
measurements (when applicable) are obtained utilizing NASCET
criteria, using the distal internal carotid diameter as the
denominator.
CONTRAST:  75mL OMNIPAQUE IOHEXOL 350 MG/ML SOLN

[Series 6: cta neck/head · axial · 0.47mm/px · z∈[+1014,+1126]mm · 2 of 168 slices shown]
[im 56/168  soft-tissue]
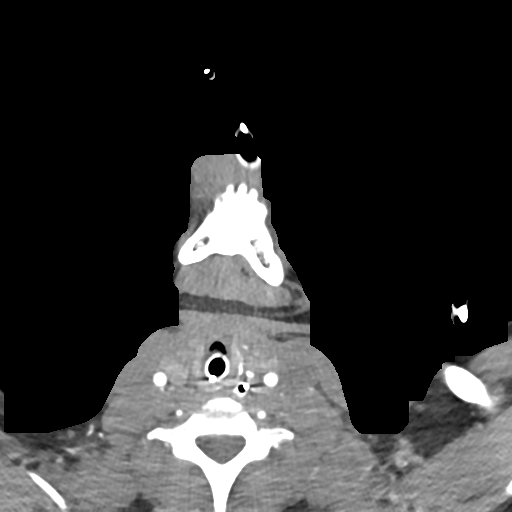
[im 112/168  soft-tissue]
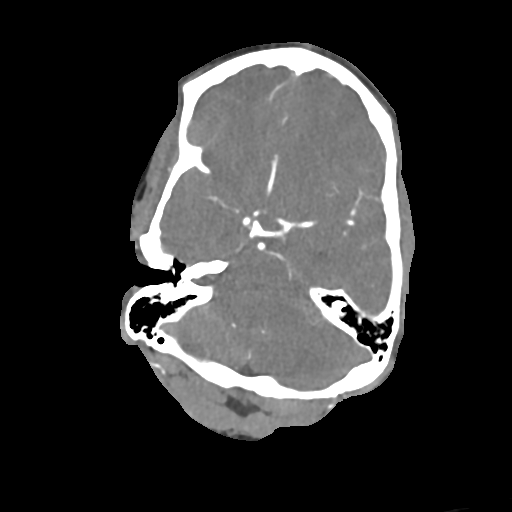

[Series 8: ax thins · axial · 0.39mm/px · z∈[+975,+1211]mm · 6 of 336 slices shown]
[im 48/336  soft-tissue]
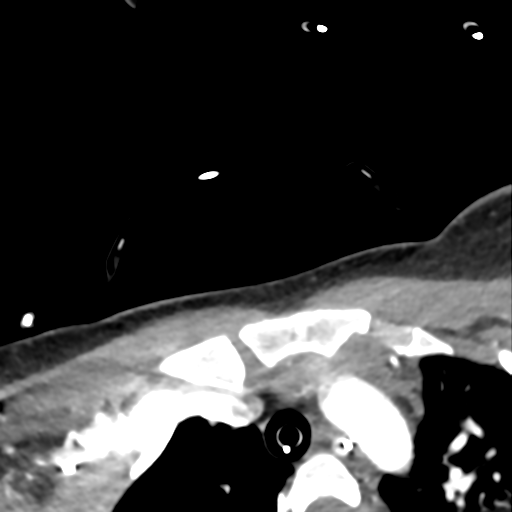
[im 96/336  bone]
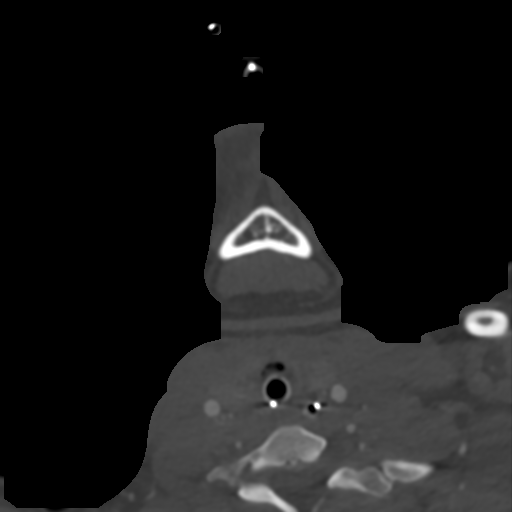
[im 144/336  soft-tissue]
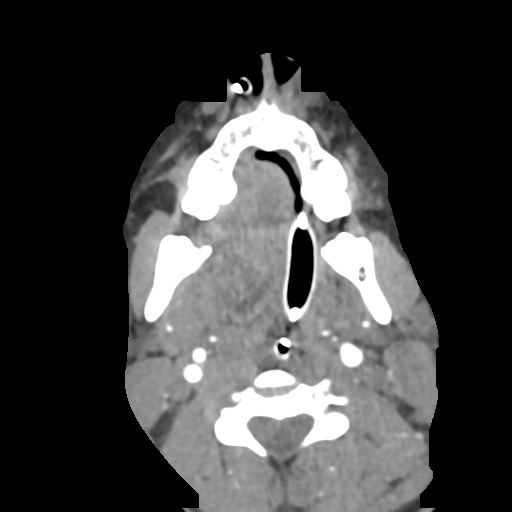
[im 192/336  bone]
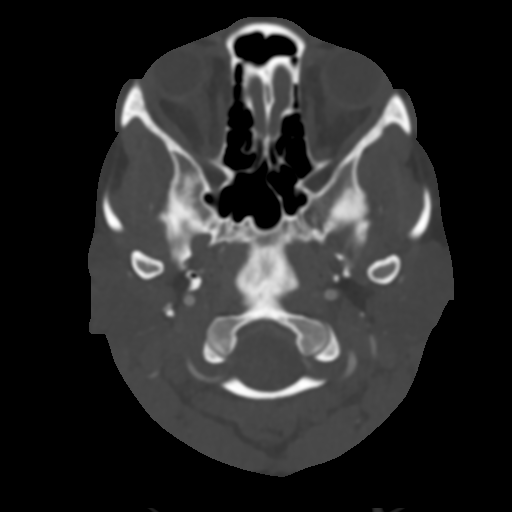
[im 240/336  soft-tissue]
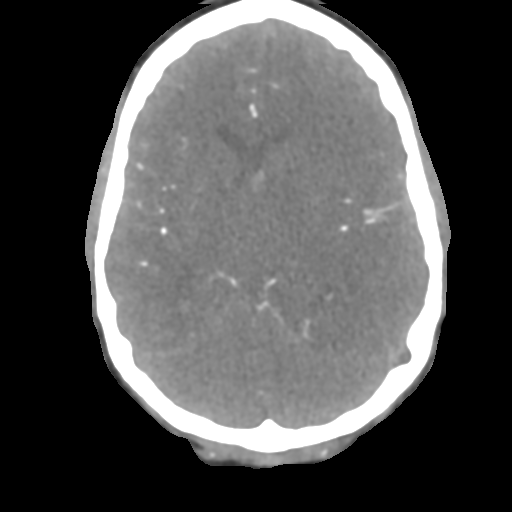
[im 288/336  bone]
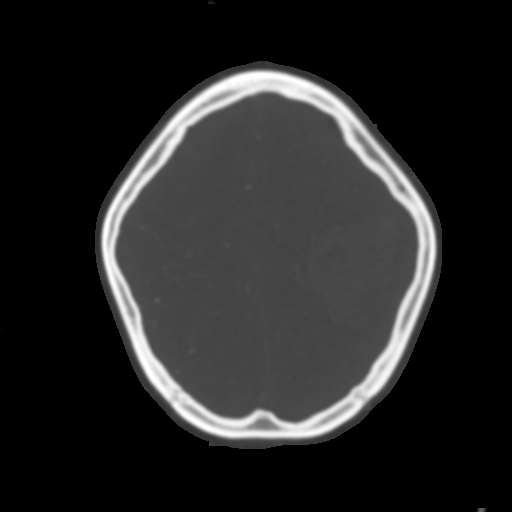

[Series 10: sag thins · sagittal · 0.47mm/px · 2 of 201 slices shown]
[im 67/201  soft-tissue]
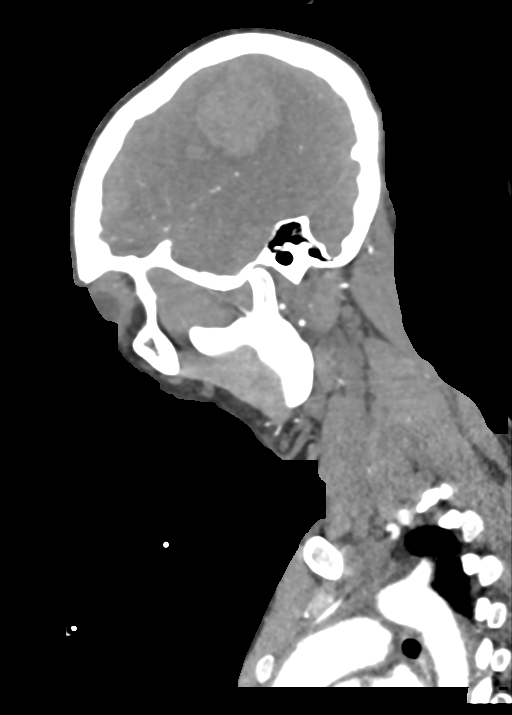
[im 135/201  soft-tissue]
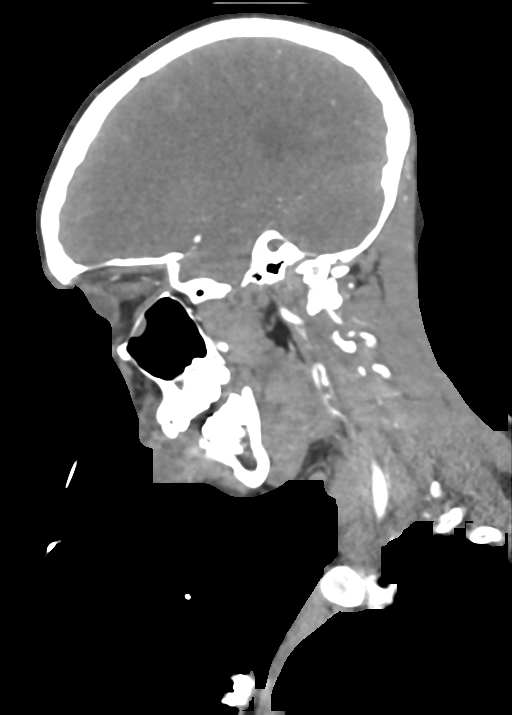

[10 of 36 positions shown; findings below may reference images not displayed]

FINDINGS: CTA NECK FINDINGS

Aortic arch: Standard aortic branching. The visualized aortic arch
is normal in caliber. No hemodynamically significant innominate or
proximal subclavian artery stenosis.

Right carotid system: CCA and ICA patent within the neck without
stenosis.

Left carotid system: CCA and ICA patent within the neck without
stenosis.

Vertebral arteries: The intracranial vertebral arteries are patent.

Skeleton: No acute bony abnormality or aggressive osseous lesion.

Other neck: No neck mass or cervical lymphadenopathy.

Upper chest: No consolidation within the imaged lung apices.
Partially imaged ET tube extending into the right mainstem bronchus.
Partially imaged enteric tube.

Review of the MIP images confirms the above findings

CTA HEAD FINDINGS

Anterior circulation:

The intracranial internal carotid arteries are patent. The M1 middle
cerebral arteries are patent. No M2 proximal branch occlusion or
high-grade proximal stenosis is identified. The anterior cerebral
arteries are patent. No intracranial aneurysm is identified. No
evidence of AVM. No contrast blush at the site of the patient's left
cerebral hemisphere acute parenchymal hematoma.

Posterior circulation:

The intracranial vertebral arteries are patent. The basilar artery
is patent. The posterior cerebral arteries are patent.

Venous sinuses: The dural venous sinuses are poorly assessed due to
contrast bolus timing.

Anatomic variants: None significant

Review of the MIP images confirms the above findings

Findings of right mainstem bronchus intubation called by telephone
at the time of interpretation on 10/17/2020 at [DATE] to provider
SAU YIN AKINA , who verbally acknowledged these results.
IMPRESSION: CTA neck:

1. The common carotid, internal carotid and vertebral arteries are
patent within the neck without stenosis. No evidence of dissection
or aneurysm.
2. Right mainstem intubation.

CTA head:

1. No intracranial aneurysm or AVM is identified. However, a
contrast-enhanced brain MRI and MR angiography should be considered
once the hematoma involute to further evaluate for any underlying
lesion.
2. No contrast blush in the region of the left cerebral hemisphere
acute parenchymal hematoma.
3. No intracranial large vessel occlusion or proximal high-grade
arterial stenosis.

## 2021-06-09 ENCOUNTER — Ambulatory Visit: Payer: No Typology Code available for payment source | Admitting: Psychology

## 2021-06-25 DIAGNOSIS — I610 Nontraumatic intracerebral hemorrhage in hemisphere, subcortical: Secondary | ICD-10-CM | POA: Diagnosis not present

## 2021-06-25 DIAGNOSIS — G9389 Other specified disorders of brain: Secondary | ICD-10-CM | POA: Diagnosis not present

## 2021-07-31 DIAGNOSIS — I610 Nontraumatic intracerebral hemorrhage in hemisphere, subcortical: Secondary | ICD-10-CM | POA: Diagnosis not present

## 2021-07-31 DIAGNOSIS — Z6826 Body mass index (BMI) 26.0-26.9, adult: Secondary | ICD-10-CM | POA: Diagnosis not present

## 2021-09-08 DIAGNOSIS — N760 Acute vaginitis: Secondary | ICD-10-CM | POA: Diagnosis not present

## 2021-09-08 DIAGNOSIS — Z118 Encounter for screening for other infectious and parasitic diseases: Secondary | ICD-10-CM | POA: Diagnosis not present

## 2021-09-08 DIAGNOSIS — N898 Other specified noninflammatory disorders of vagina: Secondary | ICD-10-CM | POA: Diagnosis not present

## 2021-09-22 NOTE — Progress Notes (Deleted)
?[facility] ?[id number] ?[place]. [place] [id number] ?[phone] ? ?     STROKE FOLLOW UP NOTE ? ?[name] ?Date of Birth:  [birth year] ?Medical Record Number:  [id number]  ? ?Reason for Referral: stroke follow up ? ? ? ?SUBJECTIVE: ? ? ?CHIEF COMPLAINT:  ?No chief complaint on file. ? ? ? ?HPI:  ? ?Update 09/22/2021 JM: 26 year old female with history of ICH who is being seen today for 1226-month follow-up.  Overall stable without new stroke/TIA symptoms.  Reports residual ***.  Continues to work without difficulty and maintains ADLs and IADLs independently.  No new concerns at this time. ? ? ? ? ? 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  She is unaccompanied today.  Overall stable.  Continues to experience numbness on right side without much improvement but has been adjusting well. She has since returned back to work over the past month on light duty without difficulty. Occasional word finding issues but able to correct herself.  Denies residual bleeding or processing difficulties.  Denies new stroke/TIA symptoms.  She questions if she can return back to drinking alcohol.  No further concerns at this time. ? ?Initial visit 12/23/2020 JM: Jane Martin is being seen for hospital follow-up accompanied by her mother.  She reports residual right-sided numbness/tingling, mild right-sided weakness, speech difficulty and reading difficulty and processing.  Denies residual dysphagia.  She continues to work with neuro rehab PT/OT/SLP with continued recovery.  She is able to maintain ADLs independently and has been working with OT relearning IADLs.  She is currently living with her mother which was her living situation prior.  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  Denies new or worsening stroke/TIA symptoms.  She has been seen by Dr. Danielle DessElsner since discharge and  has follow-up appointment in October with plans on repeating MRI per mother (unable to personally view via epic).  Follows with Dr. Kieth Brightlyodenbough with plans on neurocognitive evaluation in July.  Has not yet had follow-up with Dr. Corliss Skainseveshwar.  She has since completely stopped vaping.  Denies any EtOH or drug use.  Denies use of birth control even prior to recent ICH.  No further concerns at this time. ? ?Stroke admission 10/17/2020 ?[name] is a 26 y.o. female with no history, presented to ED on 10/17/2020 by EMS after collapsing at a gas station. It was told that she yelled out for help and for someone to call 911, then collapsed to the ground where she was found to be unresponsive with fixed gaze, right hemiplegia and agonal breathing. When EMS arrived, she was unresponsive which worsened to comatose requiring intubation in the ED.  Personally reviewed hospitalization pertinent progress notes, lab work and imaging with summary provided.  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  Placed on Keppra s/p craniotomy for seizure prevention and eventually discontinued on 5/3.  Cerebral angiogram showed no evidence of AVM, aneurysm or stenosis did show some irregularity of annular branch of left-MCA M4 region question due to mass defect or spasms.  Dr. Danielle DessElsner noted a "small cluster tissue found that was cauterized and removed in a singular piece. After this it seemed that the hemorrhage subsided considerably".  Pathology showed benign brain parenchyma.  Plan to follow with Dr. Corliss Skainseveshwar outpatient 6 weeks postprocedure.  Other stroke risk factors  include use of birth control, questionable hypertension, THC, COVID-positive, and family history of stroke.  Hospital course complicated by acute blood loss anemia, asymptomatic bradycardia, agitation and urinary retention.  Residual deficits of expressive > receptive aphasia, right-sided weakness and dysphagia.   Discharged to CIR on 4/28 - 5/5.  ? ? ? ? ?ROS:   ?14 system review of systems performed and negative with exception of those listed in HPI ? ?PMH:  ?Past Medical History:  ?Diagnosis Date  ? Stroke [facility]) 10/07/2020  ? ? ?PSH:  ?Past Surgical History:  ?Procedure Laterality Date  ? CRANIOTOMY Left 10/17/2020  ? Procedure: CRANIOTOMY HEMATOMA Evacuation;  Surgeon: Barnett Abu, MD;  Location: [facility] OR;  Service: Neurosurgery;  Laterality: Left;  ? IR ANGIO INTRA EXTRACRAN SEL COM CAROTID INNOMINATE BILAT MOD SED  10/29/2020  ? IR ANGIO VERTEBRAL SEL VERTEBRAL BILAT MOD SED  10/29/2020  ? IR US GUIDE VASC ACCESS RIGHT  10/29/2020  ? ? ?Social History:  ?Social History  ? ?Socioeconomic History  ? Marital status: Single  ?  Spouse name: Not on file  ? Number of children: Not on file  ? Years of education: Not on file  ? Highest education level: Not on file  ?Occupational History  ? Not on file  ?Tobacco Use  ? Smoking status: Never  ? Smokeless tobacco: Never  ?Vaping Use  ? Vaping Use: Never used  ?Substance and Sexual Activity  ? Alcohol use: Yes  ? Drug use: Yes  ?  Types: Marijuana  ? Sexual activity: Yes  ?  Birth control/protection: None  ?Other Topics Concern  ? Not on file  ?Social History Narrative  ? Not on file  ? ?Social Determinants of Health  ? ?Financial Resource Strain: Not on file  ?Food Insecurity: Not on file  ?Transportation Needs: Not on file  ?Physical Activity: Not on file  ?Stress: Not on file  ?Social Connections: Not on file  ?Intimate Partner Violence: Not on file  ? ? ?Family History:  ?Family History  ?Problem Relation Age of Onset  ? Hypertension Mother   ? Intracerebral hemorrhage Mother   ?     due to HTN  ? Healthy Father   ? ? ?Medications:   ?Current Outpatient Medications on File Prior to Visit  ?Medication Sig Dispense Refill  ? acetaminophen (TYLENOL) 325 MG tablet Take 2 tablets (650 mg total) by mouth every 6 (six) hours as needed for mild pain (or temp > 37.5 C (99.5 F)).    ?  artificial tears (LACRILUBE) OINT ophthalmic ointment Place 1 application into both eyes as needed (apply to protect eyes from lash extensions).    ? fluconazole (DIFLUCAN) 150 MG tablet Take 1 tablet (150 mg total) by mouth daily. Take 1 tablet by mouth, if symptoms persist after 72 hours can take the second tablet 2 tablet 0  ? valACYclovir (VALTREX) 500 MG tablet Take 1 tablet (500 mg total) by mouth 2 (two) times daily for 3 days 6 tablet 5  ? ?No current facility-administered medications on file prior to visit.  ? ? ?Allergies:   ?Allergies  ?Allergen Reactions  ? Flagyl [Metronidazole]   ? ? ? ? ?OBJECTIVE: ? ?Physical Exam ? ?There were no vitals filed for this visit. ? ? ?There is no height or weight on file to calculate BMI. ?No results found. ? ?General: well developed, well nourished, very pleasant young African-American female, seated, in no evident distress ?Head: head normocephalic and atraumatic.   ?  Neck: supple with no carotid or supraclavicular bruits ?Cardiovascular: regular rate and rhythm, no murmurs ?Musculoskeletal: no deformity ?Skin:  no rash/petichiae ?Vascular:  Normal pulses all extremities ?  ?Neurologic Exam ?Mental Status: Awake and fully alert.  Occasional word finding difficulty.  Occasional difficulty following simple step commands.  Oriented to place and time. Recent and remote memory intact. Attention span, concentration and fund of knowledge appropriate during visit. Mood and affect appropriate.  ?Cranial Nerves: Pupils equal, briskly reactive to light. Extraocular movements full without nystagmus. Visual fields full to confrontation. Hearing intact. Facial sensation intact. Face, tongue, palate moves normally and symmetrically.  ?Motor: Normal bulk and tone. Normal strength in all tested extremity muscles except slight decreased right hand dexterity ?Sensory.:  Minimal sensation right upper and lower extremity compared to left side ?Coordination: Rapid alternating movements  normal in all extremities except slightly decreased right hand. Finger-to-nose and heel-to-shin right-sided apraxia ?Gait and Station: Arises from chair without difficulty. Stance is normal. Gait demonstrates nor

## 2021-09-23 ENCOUNTER — Ambulatory Visit: Payer: 59 | Admitting: Adult Health

## 2021-09-23 ENCOUNTER — Encounter: Payer: Self-pay | Admitting: Adult Health

## 2021-10-13 ENCOUNTER — Encounter: Payer: Self-pay | Admitting: Emergency Medicine

## 2021-10-13 ENCOUNTER — Ambulatory Visit
Admission: EM | Admit: 2021-10-13 | Discharge: 2021-10-13 | Disposition: A | Discharge status: Home / Self Care | Payer: PRIVATE HEALTH INSURANCE | Attending: Student | Admitting: Student

## 2021-10-13 DIAGNOSIS — Z202 Contact with and (suspected) exposure to infections with a predominantly sexual mode of transmission: Secondary | ICD-10-CM | POA: Diagnosis not present

## 2021-10-13 DIAGNOSIS — Z113 Encounter for screening for infections with a predominantly sexual mode of transmission: Secondary | ICD-10-CM | POA: Diagnosis present

## 2021-10-13 LAB — POCT URINALYSIS DIP (MANUAL ENTRY)
Bilirubin, UA: NEGATIVE
Blood, UA: NEGATIVE
Glucose, UA: NEGATIVE mg/dL
Ketones, POC UA: NEGATIVE mg/dL
Leukocytes, UA: NEGATIVE
Nitrite, UA: NEGATIVE
Spec Grav, UA: >=1.03 — AB (ref 1.010–1.025)
Urobilinogen, UA: 0.2 E.U./dL
pH, UA: 6 (ref 5.0–8.0)

## 2021-10-13 LAB — POCT URINE PREGNANCY: Preg Test, Ur: NEGATIVE

## 2021-10-13 NOTE — ED Provider Notes (Signed)
?[facility] ? ? ? ?CSN: [id number] ?Arrival date & time: 10/13/21  1540 ? ? ?  ? ?History   ?Chief Complaint ?Chief Complaint  ?Patient presents with  ? Vaginal Itching  ? ? ?HPI ?Jane Martin is a 26 y.o. female presenting with vaginitis. History noncontributory.  Describes external vaginal dryness and itching with faint odor for few days.  States she was treated for trichomonas 2 weeks ago with resolution, but she is unsure if her partner was treated.  She is wishing retest today.  Of note, she does not tolerate metronidazole p.o. as this causes vomiting; she does tolerate MetroGel. Denies hematuria, dysuria, frequency, urgency, back pain, n/v/d/abd pain, fevers/chills, abdnormal vaginal discharge, vaginal rash/lesion. ? ? ?HPI ? ?Past Medical History:  ?Diagnosis Date  ? Stroke [facility]) 10/07/2020  ? ? ?Patient Active Problem List  ? Diagnosis Date Noted  ? Acute blood loss anemia 11/10/2020  ? Abnormal LFTs 11/10/2020  ? Aphasia 10/30/2020  ? 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 Encephalopathy acute   ? ICH (intracerebral hemorrhage) (HCC) 10/17/2020  ? ? ?Past Surgical History:  ?Procedure Laterality Date  ? CRANIOTOMY Left 10/17/2020  ? Procedure: CRANIOTOMY HEMATOMA Evacuation;  Surgeon: Barnett Abu, MD;  Location: [facility] OR;  Service: Neurosurgery;  Laterality: Left;  ? IR ANGIO INTRA EXTRACRAN SEL COM CAROTID INNOMINATE BILAT MOD SED  10/29/2020  ? IR ANGIO VERTEBRAL SEL VERTEBRAL BILAT MOD SED  10/29/2020  ? IR US GUIDE VASC ACCESS RIGHT  10/29/2020  ? ? ?OB History   ?No obstetric history on file. ?  ? ? ? ?Home Medications   ? ?Prior to Admission medications   ?Medication Sig Start Date End Date Taking? Authorizing Provider  ?acetaminophen (TYLENOL) 325 MG tablet Take 2 tablets (650 mg total) by mouth every 6 (six) hours as needed for mild pain (or temp > 37.5 C (99.5 F)). 10/30/20   Leroy Sea, MD  ?artificial tears (LACRILUBE) OINT ophthalmic ointment Place 1  application into both eyes as needed (apply to protect eyes from lash extensions). 10/30/20   Leroy Sea, MD  ?fluconazole (DIFLUCAN) 150 MG tablet Take 1 tablet (150 mg total) by mouth daily. Take 1 tablet by mouth, if symptoms persist after 72 hours can take the second tablet 02/05/21   Ward, Tylene Fantasia, PA-C  ?valACYclovir (VALTREX) 500 MG tablet Take 1 tablet (500 mg total) by mouth 2 (two) times daily for 3 days 05/08/21     ? ? ?Family History ?Family History  ?Problem Relation Age of Onset  ? Hypertension Mother   ? Intracerebral hemorrhage Mother   ?     due to HTN  ? Healthy Father   ? ? ?Social History ?Social History  ? ?Tobacco Use  ? Smoking status: Never  ? Smokeless tobacco: Never  ?Vaping Use  ? Vaping Use: Never used  ?Substance Use Topics  ? Alcohol use: Yes  ? Drug use: Yes  ?  Types: Marijuana  ? ? ? ?Allergies   ?Flagyl [metronidazole] ? ? ?Review of Systems ?Review of Systems  ?Constitutional:  Negative for chills and fever.  ?HENT:  Negative for sore throat.   ?Eyes:  Negative for pain and redness.  ?Respiratory:  Negative for shortness of breath.   ?Cardiovascular:  Negative for chest pain.  ?Gastrointestinal:  Negative for abdominal pain, diarrhea, nausea and vomiting.  ?Genitourinary:  Negative for decreased urine volume, difficulty urinating, dysuria, flank pain, frequency, genital  sores, hematuria, urgency, vaginal bleeding, vaginal discharge and vaginal pain.  ?Musculoskeletal:  Negative for back pain.  ?Skin:  Negative for rash.  ?All other systems reviewed and are negative. ? ? ?Physical Exam ?Triage Vital Signs ?ED Triage Vitals  ?Enc Vitals Group  ?   BP   ?   Pulse   ?   Resp   ?   Temp   ?   Temp src   ?   SpO2   ?   Weight   ?   Height   ?   Head Circumference   ?   Peak Flow   ?   Pain Score   ?   Pain Loc   ?   Pain Edu?   ?   Excl. in GC?   ? ?No data found. ? ?Updated Vital Signs ?BP 105/70 (BP Location: Right Arm)   Pulse 84   Temp 98.2 ?F (36.8 ?C) (Oral)   Resp 18    LMP 09/16/2021 (Approximate)   SpO2 98%  ? ?Visual Acuity ?Right Eye Distance:   ?Left Eye Distance:   ?Bilateral Distance:   ? ?Right Eye Near:   ?Left Eye Near:    ?Bilateral Near:    ? ?Physical Exam ?Vitals reviewed.  ?Constitutional:   ?   General: She is not in acute distress. ?   Appearance: Normal appearance. She is not ill-appearing.  ?HENT:  ?   Head: Normocephalic and atraumatic.  ?   Mouth/Throat:  ?   Mouth: Mucous membranes are moist.  ?   Comments: Moist mucous membranes ?Eyes:  ?   Extraocular Movements: Extraocular movements intact.  ?   Pupils: Pupils are equal, round, and reactive to light.  ?Cardiovascular:  ?   Rate and Rhythm: Normal rate and regular rhythm.  ?   Heart sounds: Normal heart sounds.  ?Pulmonary:  ?   Effort: Pulmonary effort is normal.  ?   Breath sounds: Normal breath sounds. No wheezing, rhonchi or rales.  ?Abdominal:  ?   General: Bowel sounds are normal. There is no distension.  ?   Palpations: Abdomen is soft. There is no mass.  ?   Tenderness: There is no abdominal tenderness. There is no right CVA tenderness, left CVA tenderness, guarding or rebound.  ?Genitourinary: ?   Comments: deferred ?Skin: ?   General: Skin is warm.  ?   Capillary Refill: Capillary refill takes less than 2 seconds.  ?   Comments: Good skin turgor  ?Neurological:  ?   General: No focal deficit present.  ?   Mental Status: She is alert and oriented to person, place, and time.  ?Psychiatric:     ?   Mood and Affect: Mood normal.     ?   Behavior: Behavior normal.  ? ? ? ?UC Treatments / Results  ?Labs ?(all labs ordered are listed, but only abnormal results are displayed) ?Labs Reviewed  ?POCT URINALYSIS DIP (MANUAL ENTRY) - Abnormal; Notable for the following components:  ?    Result Value  ? Clarity, UA cloudy (*)   ? Spec Grav, UA >=1.030 (*)   ? Protein Ur, POC trace (*)   ? All other components within normal limits  ?POCT URINE PREGNANCY  ?CERVICOVAGINAL ANCILLARY ONLY   ? ? ?EKG ? ? ?Radiology ?No results found. ? ?Procedures ?Procedures (including critical care time) ? ?Medications Ordered in UC ?Medications - No data to display ? ?Initial Impression / Assessment and Plan /  UC Course  ?I have reviewed the triage vital signs and the nursing notes. ? ?Pertinent labs & imaging results that were available during my care of the patient were reviewed by me and considered in my medical decision making (see chart for details). ? ?  ? ?This patient is a very pleasant 26 y.o. year old female presenting with vaginitis. Afebrile, nontachycardic, no reproducible abd pain or CVAT. Was last treated for trichomoniasis approx 2 weeks ago; she has since had unprotected intercourse with the same partner. Will send self-swab for G/C, trich, yeast, BV testing. Declines HIV, RPR. Safe sex precautions. U-preg negative today. Of note, she does not tolerate Flagyl PO, but does tolerate Metrogel without issue. ED return precautions discussed. Patient verbalizes understanding and agreement.  ?.  ? ?Final Clinical Impressions(s) / UC Diagnoses  ? ?Final diagnoses:  ?Routine screening for STI (sexually transmitted infection)  ? ?Discharge Instructions   ?None ?  ? ?ED Prescriptions   ?None ?  ? ?PDMP not reviewed this encounter. ?  ?Rhys MartiniGraham, Latayvia Mandujano E, PA-C ?10/13/21 0940 ? ?

## 2021-10-13 NOTE — Discharge Instructions (Addendum)
-  We have sent testing for sexually transmitted infections. We will notify you of any positive results once they are received. If required, we will prescribe any medications you might need. Please refrain from all sexual activity until treatment is complete.  ?-Seek additional medical attention if you develop fevers/chills, new/worsening abdominal pain, new/worsening vaginal discomfort/discharge, etc.  ?-Declines AVS ?

## 2021-10-13 NOTE — ED Triage Notes (Signed)
Pt c/o vaginal dryness and itching x 4 days. Pt was treated for trich a few weeks ago thinks she may have been reinfected. ?

## 2021-10-14 ENCOUNTER — Telehealth (HOSPITAL_COMMUNITY): Payer: Self-pay | Admitting: Emergency Medicine

## 2021-10-14 LAB — CERVICOVAGINAL ANCILLARY ONLY
Bacterial Vaginitis (gardnerella): POSITIVE — AB
Candida Glabrata: NEGATIVE
Candida Vaginitis: NEGATIVE
Chlamydia: NEGATIVE
Comment: NEGATIVE
Comment: NEGATIVE
Comment: NEGATIVE
Comment: NEGATIVE
Comment: NEGATIVE
Comment: NORMAL
Neisseria Gonorrhea: POSITIVE — AB
Trichomonas: NEGATIVE

## 2021-10-14 MED ORDER — METRONIDAZOLE 0.75 % VA GEL: 1.0000 | Freq: Every day | VAGINAL | 0 refills | Status: AC

## 2021-10-18 ENCOUNTER — Ambulatory Visit
Admission: EM | Admit: 2021-10-18 | Discharge: 2021-10-18 | Disposition: A | Discharge status: Home / Self Care | Payer: PRIVATE HEALTH INSURANCE | Attending: Family Medicine | Admitting: Family Medicine

## 2021-10-18 DIAGNOSIS — A549 Gonococcal infection, unspecified: Secondary | ICD-10-CM | POA: Diagnosis not present

## 2021-10-18 MED ORDER — CEFTRIAXONE SODIUM 500 MG IJ SOLR
500.0000 mg | Freq: Once | INTRAMUSCULAR | Status: AC
  Administered 2021-10-18: 500 mg via INTRAMUSCULAR

## 2021-10-18 NOTE — ED Triage Notes (Signed)
Pt presents for treatment of Gonorrhea from visit on 10/13/21 ?

## 2021-11-09 ENCOUNTER — Encounter: Payer: Self-pay | Admitting: Emergency Medicine

## 2021-11-09 ENCOUNTER — Ambulatory Visit
Admission: EM | Admit: 2021-11-09 | Discharge: 2021-11-09 | Disposition: A | Discharge status: Home / Self Care | Payer: PRIVATE HEALTH INSURANCE | Attending: Family Medicine | Admitting: Family Medicine

## 2021-11-09 DIAGNOSIS — Z202 Contact with and (suspected) exposure to infections with a predominantly sexual mode of transmission: Secondary | ICD-10-CM | POA: Diagnosis not present

## 2021-11-09 LAB — POCT URINE PREGNANCY: Preg Test, Ur: NEGATIVE

## 2021-11-09 MED ORDER — CEFTRIAXONE SODIUM 500 MG IJ SOLR
500.0000 mg | Freq: Once | INTRAMUSCULAR | Status: AC
  Administered 2021-11-09: 500 mg via INTRAMUSCULAR

## 2021-11-09 NOTE — ED Provider Notes (Signed)
?[facility] ? ? ? ?CSN: [id number] ?Arrival date & time: 11/09/21  1022 ? ? ?  ? ?History   ?Chief Complaint ?Chief Complaint  ?Patient presents with  ? Vaginal Discharge  ? ? ?HPI ?Jane Martin is a 26 y.o. female.  ? ?HPI ?Patient presents today with concern for possible reexposure to gonorrhea.  Patient tested positive for gonorrhea over a month ago and has subsequently had sexual contact with the same partner.  She reports her symptoms that she has presently feels the same as when she previously tested positive for gonorrhea. Patient's last menstrual period was 10/20/2021. ? ?Past Medical History:  ?Diagnosis Date  ? Stroke [facility]) 10/07/2020  ? ? ?Patient Active Problem List  ? Diagnosis Date Noted  ? Acute blood loss anemia 11/10/2020  ? Abnormal LFTs 11/10/2020  ? Aphasia 10/30/2020  ? 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 Encephalopathy acute   ? ICH (intracerebral hemorrhage) ([place]) 10/17/2020  ? ? ?Past Surgical History:  ?Procedure Laterality Date  ? CRANIOTOMY Left 10/17/2020  ? Procedure: CRANIOTOMY HEMATOMA Evacuation;  Surgeon: Kristeen Miss, MD;  Location: [facility];  Service: Neurosurgery;  Laterality: Left;  ? IR ANGIO INTRA EXTRACRAN SEL COM CAROTID INNOMINATE BILAT MOD SED  10/29/2020  ? IR ANGIO VERTEBRAL SEL VERTEBRAL BILAT MOD SED  10/29/2020  ? IR US GUIDE VASC ACCESS RIGHT  10/29/2020  ? ? ?OB History   ?No obstetric history on file. ?  ? ? ? ?Home Medications   ? ?Prior to Admission medications   ?Medication Sig Start Date End Date Taking? Authorizing Provider  ?acetaminophen (TYLENOL) 325 MG tablet Take 2 tablets (650 mg total) by mouth every 6 (six) hours as needed for mild pain (or temp > 37.5 C (99.5 F)). 10/30/20   Thurnell Lose, MD  ?artificial tears (LACRILUBE) OINT ophthalmic ointment Place 1 application into both eyes as needed (apply to protect eyes from lash extensions). 10/30/20   Thurnell Lose, MD  ?fluconazole (DIFLUCAN) 150 MG tablet  Take 1 tablet (150 mg total) by mouth daily. Take 1 tablet by mouth, if symptoms persist after 72 hours can take the second tablet 02/05/21   Ward, Lenise Arena, PA-C  ?valACYclovir (VALTREX) 500 MG tablet Take 1 tablet (500 mg total) by mouth 2 (two) times daily for 3 days 05/08/21     ? ? ?Family History ?Family History  ?Problem Relation Age of Onset  ? Hypertension Mother   ? Intracerebral hemorrhage Mother   ?     due to HTN  ? Healthy Father   ? ? ?Social History ?Social History  ? ?Tobacco Use  ? Smoking status: Never  ? Smokeless tobacco: Never  ?Vaping Use  ? Vaping Use: Never used  ?Substance Use Topics  ? Alcohol use: Yes  ? Drug use: Yes  ?  Types: Marijuana  ? ? ? ?Allergies   ?Flagyl [metronidazole] ? ? ?Review of Systems ?Review of Systems ?Pertinent negatives listed in HPI  ? ?Physical Exam ?Triage Vital Signs ?ED Triage Vitals  ?Enc Vitals Group  ?   BP   ?   Pulse   ?   Resp   ?   Temp   ?   Temp src   ?   SpO2   ?   Weight   ?   Height   ?   Head Circumference   ?   Peak Flow   ?   Pain  Score   ?   Pain Loc   ?   Pain Edu?   ?   Excl. in [place]?   ? ?No data found. ? ?Updated Vital Signs ?BP 111/69 (BP Location: Left Arm)   Pulse 76   Temp 98.6 ?F (37 ?C) (Oral)   Resp 18   LMP 10/20/2021   SpO2 98%  ? ?Visual Acuity ?Right Eye Distance:   ?Left Eye Distance:   ?Bilateral Distance:   ? ?Right Eye Near:   ?Left Eye Near:    ?Bilateral Near:    ? ?Physical Exam ?Constitutional:   ?   Appearance: Normal appearance.  ?HENT:  ?   Head: Normocephalic.  ?Eyes:  ?   Extraocular Movements: Extraocular movements intact.  ?   Pupils: Pupils are equal, round, and reactive to light.  ?Cardiovascular:  ?   Rate and Rhythm: Normal rate and regular rhythm.  ?Pulmonary:  ?   Effort: Pulmonary effort is normal.  ?   Breath sounds: Normal breath sounds.  ?Neurological:  ?   General: No focal deficit present.  ?   Mental Status: She is alert.  ?Psychiatric:     ?   Mood and Affect: Mood normal.     ?   Behavior: Behavior  normal.  ? ?Vaginal self swab collected ?UC Treatments / Results  ?Labs ?(all labs ordered are listed, but only abnormal results are displayed) ?Labs Reviewed  ?POCT URINE PREGNANCY  ?CERVICOVAGINAL ANCILLARY ONLY  ? ? ?EKG ? ? ?Radiology ?No results found. ? ?Procedures ?Procedures (including critical care time) ? ?Medications Ordered in UC ?Medications  ?cefTRIAXone (ROCEPHIN) injection 500 mg (500 mg Intramuscular Given 11/09/21 1118)  ? ? ?Initial Impression / Assessment and Plan / UC Course  ?I have reviewed the triage vital signs and the nursing notes. ? ?Pertinent labs & imaging results that were available during my care of the patient were reviewed by me and considered in my medical decision making (see chart for details). ? ?  ?Possible Exposure to STD  ?Given recent history , treated for possible GC with Rocephin 50 mg ?Vaginal Cytology pending. Advised if any additional treatment is warranted, our office will notify her by phone . ?Final Clinical Impressions(s) / UC Diagnoses  ? ?Final diagnoses:  ?Possible exposure to STD  ? ? ? ?Discharge Instructions   ? ?  ?You were treated with Rocephin today. If any additional treatment warranted following receipt of your STD results, our office will contact you and notify you by phone. ? ? ? ? ?ED Prescriptions   ?None ?  ? ?PDMP not reviewed this encounter. ?  ?Scot Jun, FNP ?11/09/21 1202 ? ?

## 2021-11-09 NOTE — ED Triage Notes (Signed)
Pt presents with vaginal discharge x 3 days. She would like STD testing.  ?

## 2021-11-09 NOTE — Discharge Instructions (Signed)
You were treated with Rocephin today. If any additional treatment warranted following receipt of your STD results, our office will contact you and notify you by phone. ?

## 2021-11-10 ENCOUNTER — Telehealth (HOSPITAL_COMMUNITY): Payer: Self-pay | Admitting: Emergency Medicine

## 2021-11-10 LAB — CERVICOVAGINAL ANCILLARY ONLY
Bacterial Vaginitis (gardnerella): POSITIVE — AB
Candida Glabrata: NEGATIVE
Candida Vaginitis: POSITIVE — AB
Chlamydia: NEGATIVE
Comment: NEGATIVE
Comment: NEGATIVE
Comment: NEGATIVE
Comment: NEGATIVE
Comment: NEGATIVE
Comment: NORMAL
Neisseria Gonorrhea: NEGATIVE
Trichomonas: NEGATIVE

## 2021-11-10 MED ORDER — METRONIDAZOLE 0.75 % VA GEL: 1.0000 | Freq: Every day | VAGINAL | 0 refills | Status: AC

## 2021-11-10 MED ORDER — FLUCONAZOLE 150 MG PO TABS: 150.0000 mg | ORAL_TABLET | Freq: Once | ORAL | 0 refills | Status: AC

## 2021-12-16 ENCOUNTER — Encounter: Payer: PRIVATE HEALTH INSURANCE | Attending: Psychology | Admitting: Psychology

## 2022-01-25 ENCOUNTER — Other Ambulatory Visit (HOSPITAL_COMMUNITY): Payer: Self-pay

## 2022-01-26 ENCOUNTER — Encounter: Payer: Self-pay | Admitting: Emergency Medicine

## 2022-01-26 ENCOUNTER — Ambulatory Visit
Admission: EM | Admit: 2022-01-26 | Discharge: 2022-01-26 | Disposition: A | Discharge status: Home / Self Care | Payer: PRIVATE HEALTH INSURANCE | Attending: Emergency Medicine | Admitting: Emergency Medicine

## 2022-01-26 DIAGNOSIS — N898 Other specified noninflammatory disorders of vagina: Secondary | ICD-10-CM | POA: Diagnosis not present

## 2022-01-26 DIAGNOSIS — Z113 Encounter for screening for infections with a predominantly sexual mode of transmission: Secondary | ICD-10-CM | POA: Diagnosis not present

## 2022-01-26 MED ORDER — FLUCONAZOLE 150 MG PO TABS: 150.0000 mg | ORAL_TABLET | Freq: Every day | ORAL | 0 refills | Status: DC

## 2022-01-26 NOTE — ED Triage Notes (Signed)
Pt here with 3 day hx of milky vaginal discharge and vaginal itching. Pt would like metronidazole gel instead of pills if that is indicated.

## 2022-01-26 NOTE — ED Provider Notes (Signed)
[facility]    CSN: [id number] Arrival date & time: 01/26/22  [id number]      History   Chief Complaint Chief Complaint  Patient presents with   Vaginal Itching    HPI Jane MooresJahayla M Martin is a 26 y.o. female.  Patient presents with white vaginal discharge and vaginal itching x3 days.  She states this is similar to previous yeast infection.  No fever, chills, rash, sores, abdominal pain, dysuria, hematuria, flank pain, pelvic pain, or other symptoms.  No treatments at home.  Her medical history includes gonorrhea, bacterial vaginitis, candidal vaginitis, intracerebral hemorrhage, stroke.  LMP: 01/16/2022; She denies current pregnancy or breastfeeding.     The history is provided by the patient and medical records.    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      Home Medications    Prior to Admission medications   Medication Sig Start Date End Date Taking? Authorizing Provider  fluconazole (DIFLUCAN) 150 MG tablet Take 1 tablet (150 mg total) by mouth daily. Take one tablet today.  May repeat in 3 days. 01/26/22  Yes Mickie Bailate, Velisa Regnier H, NP  acetaminophen (TYLENOL) 325 MG tablet Take 2 tablets (650 mg total) by mouth every 6 (six)  hours as needed for mild pain (or temp > 37.5 C (99.5 F)). 10/30/20   Leroy SeaSingh, Prashant K, MD  artificial tears (LACRILUBE) OINT ophthalmic ointment Place 1 application into both eyes as needed (apply to protect eyes from lash extensions). 10/30/20   Leroy SeaSingh, Prashant K, MD  valACYclovir (VALTREX) 500 MG tablet Take 1 tablet (500 mg total) by mouth 2 (two) times daily for 3 days 05/08/21       Family History Family History  Problem Relation Age of Onset   Hypertension Mother    Intracerebral hemorrhage Mother        due to HTN   Healthy Father     Social History Social History   Tobacco Use   Smoking status: Never   Smokeless tobacco: Never  Vaping Use   Vaping Use: Never used  Substance Use Topics   Alcohol use: Yes   Drug use: Yes    Types: Marijuana     Allergies   Flagyl [metronidazole]   Review of Systems Review of Systems  Constitutional:  Negative for chills and fever.  Gastrointestinal:  Negative for abdominal pain, constipation, diarrhea, nausea and vomiting.  Genitourinary:  Positive for vaginal discharge. Negative for dysuria, flank pain, hematuria and pelvic pain.  Skin:  Negative for color change and rash.  All other systems reviewed and are negative.    Physical Exam Triage Vital Signs ED Triage Vitals  Enc Vitals Group  BP      Pulse      Resp      Temp      Temp src      SpO2      Weight      Height      Head Circumference      Peak Flow      Pain Score      Pain Loc      Pain Edu?      Excl. in [place]?    No data found.  Updated Vital Signs BP 96/64 Comment: Normally runs low  Pulse 66   Temp 98.6 F (37 C)   Resp 18   SpO2 98%   Visual Acuity Right Eye Distance:   Left Eye Distance:   Bilateral Distance:    Right Eye Near:   Left Eye Near:    Bilateral Near:     Physical Exam Vitals and nursing note reviewed.  Constitutional:      General: She is not in acute distress.    Appearance: Normal appearance. She is  well-developed. She is not ill-appearing.  HENT:     Mouth/Throat:     Mouth: Mucous membranes are moist.  Cardiovascular:     Rate and Rhythm: Normal rate and regular rhythm.     Heart sounds: Normal heart sounds.  Pulmonary:     Effort: Pulmonary effort is normal. No respiratory distress.     Breath sounds: Normal breath sounds.  Abdominal:     General: Bowel sounds are normal.     Palpations: Abdomen is soft.     Tenderness: There is no abdominal tenderness. There is no right CVA tenderness, left CVA tenderness, guarding or rebound.  Musculoskeletal:     Cervical back: Neck supple.  Skin:    General: Skin is warm and dry.  Neurological:     Mental Status: She is alert.  Psychiatric:        Mood and Affect: Mood normal.        Behavior: Behavior normal.      UC Treatments / Results  Labs (all labs ordered are listed, but only abnormal results are displayed) Labs Reviewed  CERVICOVAGINAL ANCILLARY ONLY    EKG   Radiology No results found.  Procedures Procedures (including critical care time)  Medications Ordered in UC Medications - No data to display  Initial Impression / Assessment and Plan / UC Course  I have reviewed the triage vital signs and the nursing notes.  Pertinent labs & imaging results that were available during my care of the patient were reviewed by me and considered in my medical decision making (see chart for details).  Vaginal discharge, vaginal itching, STD screening.  Patient unable to provide urine specimen during visit.   Patient obtained vaginal self swab for testing.  Treating with Diflucan.  Discussed that we will call if test results are positive.  Discussed that she may require additional treatment at that time.  Discussed that sexual partner(s) may also require treatment.  Instructed patient to abstain from sexual activity for at least 7 days.  Instructed her to follow-up with her PCP or gynecologist if her symptoms are not improving.   Patient agrees to plan of care.    Final Clinical Impressions(s) / UC Diagnoses   Final diagnoses:  Screening for STD (sexually transmitted disease)  Vaginal discharge  Vaginal itching     Discharge Instructions      Take the Diflucan as directed.  Your vaginal tests are pending.  If your test results are positive, we will call you.  You and your sexual partner(s) may require treatment at that time.  Do not have sexual activity for at least 7 days.    Follow up with your primary care provider or gynecologist if your symptoms are not improving.         ED Prescriptions     Medication Sig Dispense Auth. Provider   fluconazole (DIFLUCAN) 150 MG tablet Take 1 tablet (150 mg total) by mouth daily. Take one tablet today.  May repeat in 3 days. 2 tablet Mickie Bail, NP      PDMP not reviewed this encounter.   Mickie Bail, NP 01/26/22 [phone]

## 2022-01-26 NOTE — Discharge Instructions (Addendum)
Take the Diflucan as directed.   Your vaginal tests are pending.  If your test results are positive, we will call you.  You and your sexual partner(s) may require treatment at that time.  Do not have sexual activity for at least 7 days.    Follow up with your primary care provider or gynecologist if your symptoms are not improving.      

## 2022-01-27 ENCOUNTER — Other Ambulatory Visit (HOSPITAL_COMMUNITY): Payer: Self-pay

## 2022-01-28 ENCOUNTER — Telehealth (HOSPITAL_COMMUNITY): Payer: Self-pay | Admitting: Emergency Medicine

## 2022-01-28 LAB — CERVICOVAGINAL ANCILLARY ONLY
Bacterial Vaginitis (gardnerella): POSITIVE — AB
Candida Glabrata: NEGATIVE
Candida Vaginitis: POSITIVE — AB
Chlamydia: NEGATIVE
Comment: NEGATIVE
Comment: NEGATIVE
Comment: NEGATIVE
Comment: NEGATIVE
Comment: NEGATIVE
Comment: NORMAL
Neisseria Gonorrhea: NEGATIVE
Trichomonas: NEGATIVE

## 2022-01-28 MED ORDER — METRONIDAZOLE 0.75 % VA GEL: 1.0000 | Freq: Every day | VAGINAL | 0 refills | Status: AC

## 2022-02-04 ENCOUNTER — Other Ambulatory Visit (HOSPITAL_COMMUNITY): Payer: Self-pay

## 2022-02-19 ENCOUNTER — Other Ambulatory Visit (HOSPITAL_COMMUNITY): Payer: Self-pay

## 2022-03-01 ENCOUNTER — Other Ambulatory Visit (HOSPITAL_COMMUNITY): Payer: Self-pay

## 2022-03-15 ENCOUNTER — Other Ambulatory Visit (HOSPITAL_COMMUNITY): Payer: Self-pay

## 2022-03-22 ENCOUNTER — Other Ambulatory Visit (HOSPITAL_COMMUNITY): Payer: Self-pay

## 2022-04-01 ENCOUNTER — Other Ambulatory Visit (HOSPITAL_COMMUNITY): Payer: Self-pay

## 2022-04-03 ENCOUNTER — Ambulatory Visit
Admission: EM | Admit: 2022-04-03 | Discharge: 2022-04-03 | Disposition: A | Discharge status: Home / Self Care | Payer: PRIVATE HEALTH INSURANCE

## 2022-04-03 DIAGNOSIS — I610 Nontraumatic intracerebral hemorrhage in hemisphere, subcortical: Secondary | ICD-10-CM | POA: Insufficient documentation

## 2022-04-03 DIAGNOSIS — B009 Herpesviral infection, unspecified: Secondary | ICD-10-CM | POA: Diagnosis not present

## 2022-04-03 MED ORDER — VALACYCLOVIR HCL 1 G PO TABS: 1000.0000 mg | ORAL_TABLET | Freq: Every day | ORAL | 0 refills | Status: AC

## 2022-04-03 NOTE — ED Provider Notes (Addendum)
[name]    CSN: [id number] Arrival date & time: 04/03/22  1324      History   Chief Complaint No chief complaint on file.   HPI Jane Martin is a 26 y.o. female.   HPI  Patient presents to UC with report of herpes outbreak x 2 days. Denies abdominal pain, dysuria, fever, anorexia, vaginal discharge.   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 Treated with valacyclovir 1000 mg TID x 10 days. More recently 500 mg BID x 3 days.  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      Home Medications    Prior to Admission medications   Medication Sig Start Date End Date Taking? Authorizing Provider  acetaminophen (TYLENOL) 325 MG tablet Take 2 tablets (650 mg total) by mouth every 6 (six) hours as needed for mild pain (or temp > 37.5 C (99.5 F)). 10/30/20   Leroy Sea, MD  artificial tears (LACRILUBE) OINT ophthalmic ointment Place 1 application into both eyes as needed (apply to protect eyes from lash extensions). 10/30/20   Leroy Sea, MD  fluconazole (DIFLUCAN) 150 MG tablet Take 1 tablet (150 mg total) by mouth daily. Take one tablet today.  May repeat in 3 days. 01/26/22   Mickie Bail, NP   valACYclovir (VALTREX) 500 MG tablet Take 1 tablet (500 mg total) by mouth 2 (two) times daily for 3 days 05/08/21       Family History Family History  Problem Relation Age of Onset   Hypertension Mother    Intracerebral hemorrhage Mother        due to HTN   Healthy Father     Social History Social History   Tobacco Use   Smoking status: Never   Smokeless tobacco: Never  Vaping Use   Vaping Use: Never used  Substance Use Topics   Alcohol use: Yes   Drug use: Yes    Types: Marijuana     Allergies   Flagyl [metronidazole]   Review of Systems Review of Systems   Physical Exam Triage Vital Signs ED Triage Vitals  Enc Vitals Group     BP      Pulse      Resp      Temp      Temp src      SpO2      Weight      Height      Head Circumference      Peak Flow      Pain Score      Pain Loc      Pain Edu?      Excl. in [place]?  No data found.  Updated Vital Signs There were no vitals taken for this visit.  Visual Acuity Right Eye Distance:   Left Eye Distance:   Bilateral Distance:    Right Eye Near:   Left Eye Near:    Bilateral Near:     Physical Exam   UC Treatments / Results  Labs (all labs ordered are listed, but only abnormal results are displayed) Labs Reviewed  CERVICOVAGINAL ANCILLARY ONLY    EKG   Radiology No results found.  Procedures Procedures (including critical care time)  Medications Ordered in UC Medications - No data to display  Initial Impression / Assessment and Plan / UC Course  I have reviewed the triage vital signs and the nursing notes.  Pertinent labs & imaging results that were available during my care of the patient were reviewed by me and considered in my medical decision making (see chart for details).   Patient is treated by OB/GYN.  Currently out of medication.  Explained to patient that treatment is best started before rash begins.  Will provide medication for current outbreak but directed patient to  follow-up with OB/GYN in anticipation of future outbreaks.   Final Clinical Impressions(s) / UC Diagnoses   Final diagnoses:  Vaginal discharge   Discharge Instructions   None    ED Prescriptions   None    PDMP not reviewed this encounter.   Rose Phi, [place] 04/03/22 [place]    Rose Phi, [place] 04/03/22 [phone]

## 2022-04-03 NOTE — ED Triage Notes (Signed)
Pt. Is c/o a herpes outbreak that started 2 days ago. Pt. Has ran out of her medication.

## 2022-04-03 NOTE — Discharge Instructions (Addendum)
Follow up here or with your primary care/GYN provider if your symptoms are worsening or not improving with treatment.  Please follow-up with your provider for medication in anticipation of future outbreaks.

## 2022-04-05 ENCOUNTER — Other Ambulatory Visit (HOSPITAL_COMMUNITY): Payer: Self-pay

## 2022-04-22 ENCOUNTER — Ambulatory Visit
Admission: EM | Admit: 2022-04-22 | Discharge: 2022-04-22 | Disposition: A | Discharge status: Home / Self Care | Payer: PRIVATE HEALTH INSURANCE | Attending: Urgent Care | Admitting: Urgent Care

## 2022-04-22 ENCOUNTER — Other Ambulatory Visit (HOSPITAL_COMMUNITY): Payer: Self-pay

## 2022-04-22 DIAGNOSIS — B009 Herpesviral infection, unspecified: Secondary | ICD-10-CM | POA: Diagnosis not present

## 2022-04-22 MED ORDER — VALACYCLOVIR HCL 1 G PO TABS: 1000.0000 mg | ORAL_TABLET | Freq: Every day | ORAL | 0 refills | Status: DC

## 2022-04-22 MED ORDER — VALACYCLOVIR HCL 1 G PO TABS
1000.0000 mg | ORAL_TABLET | Freq: Every day | ORAL | 0 refills | Status: AC
  Filled 2022-04-22: qty 30, 30d supply, fill #0

## 2022-04-22 NOTE — ED Provider Notes (Signed)
[name]    CSN: [id number] Arrival date & time: 04/22/22  4098      History   Chief Complaint No chief complaint on file.   HPI Jane Martin is a 26 y.o. female.   HPI  Patient presents to urgent care for recurrence of HSV outbreak x3 days.  She was seen at this clinic as recently as 04/03/2022 for herpes outbreak.  She was discharged with a prescription of Valtrex 1000 mg daily (dispense 10 tablets), instructed to take the medication at the first sign of an outbreak, and then for only 5 days.  Patient states today that she believes that she took the medication too frequently and has now run out.  She does not have an established primary care provider and has scheduled a visit with her OB/GYN for end of the month.  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      Home Medications    Prior to Admission medications   Medication Sig Start Date End Date Taking? Authorizing Provider  acetaminophen (TYLENOL) 325 MG tablet Take 2 tablets (650 mg total) by mouth every 6 (six) hours as needed for mild pain (or temp > 37.5 C (99.5 F)).  10/30/20   Leroy Sea, MD  artificial tears (LACRILUBE) OINT ophthalmic ointment Place 1 application into both eyes as needed (apply to protect eyes from lash extensions). 10/30/20   Leroy Sea, MD  fluconazole (DIFLUCAN) 150 MG tablet Take 1 tablet (150 mg total) by mouth daily. Take one tablet today.  May repeat in 3 days. 01/26/22   Mickie Bail, NP  metroNIDAZOLE (METROGEL) 0.75 % vaginal gel Place vaginally at bedtime. 02/17/22   [provider]    Family History Family History  Problem Relation Age of Onset   Hypertension Mother    Intracerebral hemorrhage Mother        due to HTN   Healthy Father     Social History Social History   Tobacco Use   Smoking status: Never   Smokeless tobacco: Never  Vaping Use   Vaping Use: Never used  Substance Use Topics   Alcohol use: Yes   Drug use: Yes    Types: Marijuana     Allergies   Flagyl [metronidazole]   Review of Systems Review of Systems   Physical Exam Triage Vital Signs ED Triage Vitals  Enc Vitals Group     BP      Pulse      Resp      Temp      Temp src      SpO2  Weight      Height      Head Circumference      Peak Flow      Pain Score      Pain Loc      Pain Edu?      Excl. in [place]?    No data found.  Updated Vital Signs LMP 03/24/2022 (Exact Date)   Visual Acuity Right Eye Distance:   Left Eye Distance:   Bilateral Distance:    Right Eye Near:   Left Eye Near:    Bilateral Near:     Physical Exam Vitals reviewed.  Constitutional:      Appearance: Normal appearance.  Skin:    General: Skin is warm and dry.  Neurological:     General: No focal deficit present.     Mental Status: She is alert and oriented to person, place, and time.  Psychiatric:        Mood and Affect: Mood normal.        Behavior: Behavior normal.      UC Treatments / Results  Labs (all labs ordered are listed, but only abnormal results are displayed) Labs Reviewed - No data to  display  EKG   Radiology No results found.  Procedures Procedures (including critical care time)  Medications Ordered in UC Medications - No data to display  Initial Impression / Assessment and Plan / UC Course  I have reviewed the triage vital signs and the nursing notes.  Pertinent labs & imaging results that were available during my care of the patient were reviewed by me and considered in my medical decision making (see chart for details).   We will provide another prescription for Valtrex 1000 mg daily.  Reiterated recommendation that she ask her primary care provider or OB/GYN provider for additional tablets with refills in case of future outbreaks.   Final Clinical Impressions(s) / UC Diagnoses   Final diagnoses:  None   Discharge Instructions   None    ED Prescriptions   None    PDMP not reviewed this encounter.   Rose Phi, [place] 04/22/22 [phone]

## 2022-04-22 NOTE — Discharge Instructions (Signed)
Please request additional refills from your OB/GYN or primary care provider.  Take this medication at the first sign of an outbreak, and then only for 5 days.

## 2022-04-22 NOTE — ED Triage Notes (Signed)
Pt. Presents to UD for herpes outbreak for the past 3 days.

## 2022-09-10 ENCOUNTER — Ambulatory Visit
Admission: EM | Admit: 2022-09-10 | Discharge: 2022-09-10 | Disposition: A | Discharge status: Home / Self Care | Payer: 59

## 2022-09-10 DIAGNOSIS — B009 Herpesviral infection, unspecified: Secondary | ICD-10-CM

## 2022-09-10 DIAGNOSIS — O98512 Other viral diseases complicating pregnancy, second trimester: Secondary | ICD-10-CM

## 2022-09-10 DIAGNOSIS — Z3A16 16 weeks gestation of pregnancy: Secondary | ICD-10-CM

## 2022-09-10 MED ORDER — VALACYCLOVIR HCL 500 MG PO TABS: 500.0000 mg | ORAL_TABLET | Freq: Two times a day (BID) | ORAL | 0 refills | Status: AC

## 2022-09-10 NOTE — ED Triage Notes (Addendum)
Patient to Urgent Care with complaints of HSV-2 outbreak that started three days ago. Reports hx of the same. Last outbreak October 2023.  Denies any other symptoms.  Currently [redacted] weeks pregnant.

## 2022-09-10 NOTE — ED Provider Notes (Signed)
Jane Martin    CSN: [id number] Arrival date & time: 09/10/22  1229      History   Chief Complaint Chief Complaint  Patient presents with   Rash    HPI Jane Martin is a 27 y.o. female.  Patient is [redacted] weeks pregnant.  She presents with herpes outbreak x 3 days.  She states the rash is typical for her usual HSV outbreak; which last occurred in October 2023.  She denies fever, chills, abdominal pain, vomiting, diarrhea, constipation, vaginal discharge, pelvic pain, or other symptoms.  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    The history is provided by the patient and medical records.    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   Encephalopathy acute    ICH (intracerebral hemorrhage) ([place]) 10/17/2020    Past Surgical History:  Procedure Laterality Date   CRANIOTOMY Left 10/17/2020   Procedure: CRANIOTOMY HEMATOMA Evacuation;  Surgeon: Kristeen Miss, MD;  Location: [place];  Service: Neurosurgery;  Laterality: Left;   IR ANGIO INTRA EXTRACRAN SEL COM CAROTID INNOMINATE BILAT MOD SED  10/29/2020   IR ANGIO VERTEBRAL SEL VERTEBRAL BILAT MOD SED  10/29/2020   IR US GUIDE VASC ACCESS RIGHT  10/29/2020    OB History     Gravida  1   Para      Term      Preterm      AB      Living         SAB      IAB      Ectopic      Multiple      Live Births               Home Medications    Prior to Admission medications   Medication Sig Start Date End Date Taking? Authorizing Provider  valACYclovir (VALTREX) 500 MG tablet Take 1 tablet (500 mg total) by mouth 2 (two) times daily for 3 days. 09/10/22 09/13/22 Yes Sharion Balloon, NP  acetaminophen  (TYLENOL) 325 MG tablet Take 2 tablets (650 mg total) by mouth every 6 (six) hours as needed for mild pain (or temp > 37.5 C (99.5 F)). 10/30/20   Thurnell Lose, MD  artificial tears (LACRILUBE) OINT ophthalmic ointment Place 1 application into both eyes as needed (apply to protect eyes from lash extensions). 10/30/20   Thurnell Lose, MD  fluconazole (DIFLUCAN) 150 MG tablet Take 1 tablet (150 mg total) by mouth daily. Take one tablet today.  May repeat in 3 days. 01/26/22   Sharion Balloon, NP  metroNIDAZOLE (METROGEL) 0.75 % vaginal gel Place vaginally at bedtime. 02/17/22   [provider]    Family History Family History  Problem Relation Age of Onset   Hypertension Mother    Intracerebral hemorrhage Mother        due to HTN   Healthy Father     Social History Social History   Tobacco Use   Smoking status: Never   Smokeless tobacco: Never  Vaping Use   Vaping Use: Never used  Substance Use Topics   Alcohol use: Yes   Drug use: Yes  Types: Marijuana     Allergies   Flagyl [metronidazole]   Review of Systems Review of Systems  Constitutional:  Negative for chills and fever.  Gastrointestinal:  Negative for abdominal pain, constipation, diarrhea, nausea and vomiting.  Genitourinary:  Negative for dysuria, hematuria, pelvic pain and vaginal discharge.  Skin:  Positive for rash.  All other systems reviewed and are negative.    Physical Exam Triage Vital Signs ED Triage Vitals  Enc Vitals Group     BP 09/10/22 1259 104/70     Pulse Rate 09/10/22 1247 81     Resp 09/10/22 1247 18     Temp 09/10/22 1247 98 F (36.7 C)     Temp src --      SpO2 09/10/22 1247 100 %     Weight --      Height --      Head Circumference --      Peak Flow --      Pain Score 09/10/22 1255 1     Pain Loc --      Pain Edu? --      Excl. in [place]? --    No data found.  Updated Vital Signs BP 104/70   Pulse 81   Temp 98 F (36.7 C)   Resp 18   LMP 04/20/2022  (Approximate)   SpO2 100%   Visual Acuity Right Eye Distance:   Left Eye Distance:   Bilateral Distance:    Right Eye Near:   Left Eye Near:    Bilateral Near:     Physical Exam Vitals and nursing note reviewed.  Constitutional:      General: She is not in acute distress.    Appearance: Normal appearance. She is well-developed. She is not ill-appearing.  HENT:     Mouth/Throat:     Mouth: Mucous membranes are moist.  Cardiovascular:     Rate and Rhythm: Normal rate and regular rhythm.     Heart sounds: Normal heart sounds.  Pulmonary:     Effort: Pulmonary effort is normal. No respiratory distress.     Breath sounds: Normal breath sounds.  Abdominal:     General: Bowel sounds are normal.     Palpations: Abdomen is soft.     Tenderness: There is no abdominal tenderness. There is no right CVA tenderness, left CVA tenderness, guarding or rebound.  Genitourinary:    Comments: Patient declines GU exam. Musculoskeletal:     Cervical back: Neck supple.  Skin:    General: Skin is warm and dry.  Neurological:     Mental Status: She is alert.  Psychiatric:        Mood and Affect: Mood normal.        Behavior: Behavior normal.      UC Treatments / Results  Labs (all labs ordered are listed, but only abnormal results are displayed) Labs Reviewed - No data to display  EKG   Radiology No results found.  Procedures Procedures (including critical care time)  Medications Ordered in UC Medications - No data to display  Initial Impression / Assessment and Plan / UC Course  I have reviewed the triage vital signs and the nursing notes.  Pertinent labs & imaging results that were available during my care of the patient were reviewed by me and considered in my medical decision making (see chart for details).   [redacted] weeks pregnant, HSV-2 infection complicating pregnancy, 2nd trimester.  Treating with Valtrex.  Instructed patient to follow up with  her OB/GYN on Monday.  ED  precautions given.  Eduction provided on 2nd trimester pregnancy and HSV.  She agrees to plan of care.    Final Clinical Impressions(s) / UC Diagnoses   Final diagnoses:  [redacted] weeks gestation of pregnancy  HSV-2 infection complicating pregnancy, second trimester     Discharge Instructions      Take the Valtrex as directed.  Follow up with your OB/GYN.      ED Prescriptions     Medication Sig Dispense Auth. Provider   valACYclovir (VALTREX) 500 MG tablet Take 1 tablet (500 mg total) by mouth 2 (two) times daily for 3 days. 6 tablet Sharion Balloon, NP      PDMP not reviewed this encounter.   Sharion Balloon, NP 09/10/22 [phone]

## 2022-09-10 NOTE — Discharge Instructions (Addendum)
Take the Valtrex as directed.  Follow up with your OB/GYN.

## 2022-10-08 ENCOUNTER — Ambulatory Visit
Admission: EM | Admit: 2022-10-08 | Discharge: 2022-10-08 | Disposition: A | Discharge status: Home / Self Care | Payer: 59

## 2022-10-08 DIAGNOSIS — Z3A19 19 weeks gestation of pregnancy: Secondary | ICD-10-CM

## 2022-10-08 DIAGNOSIS — O98512 Other viral diseases complicating pregnancy, second trimester: Secondary | ICD-10-CM

## 2022-10-08 DIAGNOSIS — B009 Herpesviral infection, unspecified: Secondary | ICD-10-CM

## 2022-10-08 MED ORDER — VALACYCLOVIR HCL 500 MG PO TABS: 500.0000 mg | ORAL_TABLET | Freq: Two times a day (BID) | ORAL | 0 refills | Status: AC

## 2022-10-08 NOTE — Discharge Instructions (Addendum)
Take the Valtrex as directed.  Follow up with your OB/GYN on Monday.

## 2022-10-08 NOTE — ED Triage Notes (Signed)
Patient to Urgent Care with complaints of HSV outbreak that started two days ago. Previously seen for the same.  Denies any other symptoms.  Currently [redacted] weeks pregnant.

## 2022-10-08 NOTE — ED Provider Notes (Signed)
[facility]    CSN: [id number] Arrival date & time: 10/08/22  1357      History   Chief Complaint Chief Complaint  Patient presents with   Rash    HPI Jane MooresJahayla M Martin is a 27 y.o. female.  Patient is [redacted] weeks pregnant.  She presents with genital blister-like herpes outbreak x 2 days.  She states the rash is her typical HSV outbreak; which last occurred 09/10/2022; she was treated with Valtrex.  She states her rash resolved with treatment and then returned 2 days ago.  She denies fever, abdominal pain, dysuria, vaginal discharge, pelvic pain, or other symptoms.  She was diagnosed with HSV type 2 on 02/07/2021.  She was last seen by OB/GYN on 08/05/2022.    The history is provided by the patient and medical records.    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   Encephalopathy acute    ICH (intracerebral hemorrhage) 10/17/2020    Past Surgical History:  Procedure Laterality Date   CRANIOTOMY Left 10/17/2020   Procedure: CRANIOTOMY HEMATOMA Evacuation;  Surgeon: Barnett AbuElsner, Henry, MD;  Location[facility];  Service: Neurosurgery;  Laterality: Left;   IR ANGIO INTRA EXTRACRAN SEL COM CAROTID INNOMINATE BILAT MOD SED  10/29/2020   IR ANGIO VERTEBRAL SEL VERTEBRAL BILAT MOD SED  10/29/2020   IR US GUIDE VASC ACCESS RIGHT  10/29/2020    OB History     Gravida  1   Para      Term      Preterm      AB      Living         SAB      IAB      Ectopic      Multiple      Live Births               Home Medications    Prior to Admission medications   Medication Sig Start Date End Date Taking? Authorizing Provider  Ferrous Sulfate (IRON PO) Take by mouth. 07/29/22  11/26/22 Yes [provider]  valACYclovir (VALTREX) 500 MG tablet Take 1 tablet (500 mg total) by mouth 2 (two) times daily for 3 days. 10/08/22 10/11/22 Yes Mickie Bailate, Umair Rosiles H, NP  acetaminophen (TYLENOL) 325 MG tablet Take 2 tablets (650 mg total) by mouth every 6 (six) hours as needed for mild pain (or temp > 37.5 C (99.5 F)). 10/30/20   Leroy SeaSingh, Prashant K, MD  artificial tears (LACRILUBE) OINT ophthalmic ointment Place 1 application into both eyes as needed (apply to protect eyes from lash extensions). 10/30/20   Leroy SeaSingh, Prashant K, MD  fluconazole (DIFLUCAN) 150 MG tablet Take 1 tablet (150 mg total) by mouth daily. Take one tablet today.  May repeat in 3 days. Patient not taking: Reported on 10/08/2022 01/26/22   Mickie Bailate, Zayin Valadez H, NP  metroNIDAZOLE (METROGEL) 0.75 % vaginal gel Place vaginally at bedtime. Patient not taking: Reported on 10/08/2022 02/17/22   [provider]  progesterone (PROMETRIUM) 200 MG capsule Take 200 mg by mouth at bedtime.    [provider]    Family History Family History  Problem Relation Age of Onset   Hypertension  Mother    Intracerebral hemorrhage Mother        due to HTN   Healthy Father     Social History Social History   Tobacco Use   Smoking status: Never   Smokeless tobacco: Never  Vaping Use   Vaping Use: Never used  Substance Use Topics   Alcohol use: Yes   Drug use: Yes    Types: Marijuana     Allergies   Flagyl [metronidazole]   Review of Systems Review of Systems  Constitutional:  Negative for chills and fever.  Gastrointestinal:  Negative for abdominal pain, nausea and vomiting.  Genitourinary:  Negative for dysuria, hematuria, pelvic pain and vaginal discharge.  Skin:  Positive for rash. Negative for color change.  All other systems reviewed and are negative.    Physical Exam Triage Vital Signs ED Triage Vitals  Enc Vitals Group     BP      Pulse      Resp      Temp      Temp src      SpO2      Weight       Height      Head Circumference      Peak Flow      Pain Score      Pain Loc      Pain Edu?      Excl. in [place]?    No data found.  Updated Vital Signs BP 110/71   Pulse 87   Temp 98 F (36.7 C)   Resp 18   LMP 04/20/2022 (Approximate)   SpO2 97%   Visual Acuity Right Eye Distance:   Left Eye Distance:   Bilateral Distance:    Right Eye Near:   Left Eye Near:    Bilateral Near:     Physical Exam Constitutional:      General: She is not in acute distress.    Appearance: Normal appearance. She is not ill-appearing.  HENT:     Mouth/Throat:     Mouth: Mucous membranes are moist.  Cardiovascular:     Rate and Rhythm: Normal rate and regular rhythm.     Heart sounds: Normal heart sounds.  Pulmonary:     Effort: Pulmonary effort is normal. No respiratory distress.     Breath sounds: Normal breath sounds.  Abdominal:     General: Bowel sounds are normal.     Tenderness: There is no abdominal tenderness. There is no right CVA tenderness, left CVA tenderness, guarding or rebound.  Genitourinary:    Comments: Patient declines GU exam.  Neurological:     Mental Status: She is alert.  Psychiatric:        Mood and Affect: Mood normal.        Behavior: Behavior normal.      UC Treatments / Results  Labs (all labs ordered are listed, but only abnormal results are displayed) Labs Reviewed - No data to display  EKG   Radiology No results found.  Procedures Procedures (including critical care time)  Medications Ordered in UC Medications - No data to display  Initial Impression / Assessment and Plan / UC Course  I have reviewed the triage vital signs and the nursing notes.  Pertinent labs & imaging results that were available during my care of the patient were reviewed by me and considered in my medical decision making (see chart for details).    [redacted] weeks pregnant, HSV-2 infection complicating pregnancy.  Patient was  just treated with Valtrex last month.   Because she has another acute outbreak, treating today with Valtrex but instructed patient to follow-up with her OB/GYN on Monday.  Education provided on HSV and on second trimester pregnancy.  ED precautions provided.  Patient agrees to plan of care.  Final Clinical Impressions(s) / UC Diagnoses   Final diagnoses:  [redacted] weeks gestation of pregnancy  HSV-2 infection complicating pregnancy, second trimester     Discharge Instructions      Take the Valtrex as directed.  Follow up with your OB/GYN on Monday.       ED Prescriptions     Medication Sig Dispense Auth. Provider   valACYclovir (VALTREX) 500 MG tablet Take 1 tablet (500 mg total) by mouth 2 (two) times daily for 3 days. 6 tablet Mickie Bail, NP      PDMP not reviewed this encounter.   Mickie Bail, NP 10/08/22 1435

## 2022-11-24 ENCOUNTER — Ambulatory Visit
Admission: EM | Admit: 2022-11-24 | Discharge: 2022-11-24 | Disposition: A | Discharge status: Home / Self Care | Payer: PRIVATE HEALTH INSURANCE | Attending: Emergency Medicine | Admitting: Emergency Medicine

## 2022-11-24 DIAGNOSIS — Z3A26 26 weeks gestation of pregnancy: Secondary | ICD-10-CM | POA: Diagnosis not present

## 2022-11-24 DIAGNOSIS — B009 Herpesviral infection, unspecified: Secondary | ICD-10-CM

## 2022-11-24 DIAGNOSIS — O98512 Other viral diseases complicating pregnancy, second trimester: Secondary | ICD-10-CM

## 2022-11-24 MED ORDER — VALACYCLOVIR HCL 500 MG PO TABS: 500.0000 mg | ORAL_TABLET | Freq: Two times a day (BID) | ORAL | 0 refills | Status: AC

## 2022-11-24 NOTE — ED Triage Notes (Signed)
Pt states she is having a HSV outbreak that started 3 days ago.  Currently [redacted] weeks pregnant.  Denies any other symptoms at this time.

## 2022-11-24 NOTE — Discharge Instructions (Addendum)
Take the Valtrex as directed.  Follow up with your OB/GYN.  

## 2022-11-24 NOTE — ED Provider Notes (Signed)
Jane Martin    CSN: [id number] Arrival date & time: 11/24/22  1245      History   Chief Complaint Chief Complaint  Patient presents with   Rash    HPI Jane Martin is a 27 y.o. female.  Patient is [redacted] weeks pregnant.  Patient presents with a vesicular outbreak on her labia.  She states this is her usual genital herpes rash.  Denies fever, chills, abdominal pain, dysuria, vaginal discharge, pelvic pain, or other symptoms.  No treatments at home.  She was seen at urgent care and treated with Valtrex on 09/10/2022 and again on 10/08/2022.  She is followed by [facility] and has been seen since her last urgent care visit.  Her last OB/GYN appointment was on 11/12/2022.  She reports her next appointment is scheduled on 11/26/2022.    The history is provided by the patient and medical records.    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   Encephalopathy acute    ICH (intracerebral hemorrhage) ([facility]) 10/17/2020    Past Surgical History:  Procedure Laterality Date   CRANIOTOMY Left 10/17/2020   Procedure: CRANIOTOMY HEMATOMA Evacuation;  Surgeon: Barnett Abu, MD;  Location: [facility];  Service: Neurosurgery;  Laterality: Left;   IR ANGIO INTRA EXTRACRAN SEL COM CAROTID INNOMINATE BILAT MOD SED  10/29/2020   IR ANGIO VERTEBRAL SEL VERTEBRAL BILAT MOD SED  10/29/2020   IR US GUIDE VASC ACCESS RIGHT  10/29/2020    OB History     Gravida  1   Para      Term      Preterm      AB      Living         SAB      IAB      Ectopic      Multiple      Live Births               Home Medications    Prior to Admission  medications   Medication Sig Start Date End Date Taking? Authorizing Provider  acetaminophen (TYLENOL) 325 MG tablet Take 2 tablets (650 mg total) by mouth every 6 (six) hours as needed for mild pain (or temp > 37.5 C (99.5 F)). 10/30/20  Yes Leroy Sea, MD  artificial tears (LACRILUBE) OINT ophthalmic ointment Place 1 application into both eyes as needed (apply to protect eyes from lash extensions). 10/30/20   Leroy Sea, MD  Ferrous Sulfate (IRON PO) Take by mouth. 07/29/22 11/26/22  [provider]  fluconazole (DIFLUCAN) 150 MG tablet Take 1 tablet (150 mg total) by mouth daily. Take one tablet today.  May repeat in 3 days. Patient not taking: Reported on 10/08/2022 01/26/22   Mickie Bail, NP  metroNIDAZOLE (METROGEL) 0.75 % vaginal gel Place vaginally at bedtime. Patient not taking: Reported on 10/08/2022 02/17/22   [provider]  progesterone (PROMETRIUM) 200 MG capsule Take 200 mg by mouth at bedtime.    [provider]  valACYclovir (VALTREX) 500 MG tablet Take 1 tablet (500 mg total) by mouth 2 (two) times daily for 3 days. 11/24/22 11/27/22  Yes Mickie Bail, NP    Family History Family History  Problem Relation Age of Onset   Hypertension Mother    Intracerebral hemorrhage Mother        due to HTN   Healthy Father     Social History Social History   Tobacco Use   Smoking status: Never   Smokeless tobacco: Never  Vaping Use   Vaping Use: Never used  Substance Use Topics   Alcohol use: Yes   Drug use: Yes    Types: Marijuana     Allergies   Flagyl [metronidazole]   Review of Systems Review of Systems  Constitutional:  Negative for chills and fever.  Gastrointestinal:  Negative for abdominal pain, nausea and vomiting.  Genitourinary:  Negative for dysuria, hematuria, pelvic pain, vaginal bleeding and vaginal discharge.  Skin:  Positive for rash.  All other systems reviewed and are negative.    Physical Exam Triage Vital  Signs ED Triage Vitals  Enc Vitals Group     BP      Pulse      Resp      Temp      Temp src      SpO2      Weight      Height      Head Circumference      Peak Flow      Pain Score      Pain Loc      Pain Edu?      Excl. in [place]?    No data found.  Updated Vital Signs BP 106/66   Pulse 84   Temp 98.2 F (36.8 C)   Resp 18   LMP 04/20/2022 (Approximate)   SpO2 96%   Visual Acuity Right Eye Distance:   Left Eye Distance:   Bilateral Distance:    Right Eye Near:   Left Eye Near:    Bilateral Near:     Physical Exam Vitals and nursing note reviewed.  Constitutional:      General: She is not in acute distress.    Appearance: Normal appearance. She is well-developed. She is not ill-appearing.  HENT:     Mouth/Throat:     Mouth: Mucous membranes are moist.  Cardiovascular:     Rate and Rhythm: Normal rate and regular rhythm.     Heart sounds: Normal heart sounds.  Pulmonary:     Effort: Pulmonary effort is normal. No respiratory distress.     Breath sounds: Normal breath sounds.  Abdominal:     General: Bowel sounds are normal.     Palpations: Abdomen is soft.     Tenderness: There is no abdominal tenderness. There is no right CVA tenderness, left CVA tenderness, guarding or rebound.  Genitourinary:    Comments: Patient declines GU exam. Musculoskeletal:     Cervical back: Neck supple.  Skin:    General: Skin is warm and dry.  Neurological:     Mental Status: She is alert.  Psychiatric:        Mood and Affect: Mood normal.        Behavior: Behavior normal.      UC Treatments / Results  Labs (all labs ordered are listed, but only abnormal results are displayed) Labs Reviewed - No data to display  EKG   Radiology No results found.  Procedures Procedures (including critical care time)  Medications Ordered in UC Medications - No data to display  Initial Impression / Assessment and Plan / UC  Course  I have reviewed the triage vital signs and  the nursing notes.  Pertinent labs & imaging results that were available during my care of the patient were reviewed by me and considered in my medical decision making (see chart for details).    [redacted] weeks pregnant, HSV rash.  Afebrile, VSS.  Patient declines GU exam today; She states her genital rash is her usual HSV rash.  She is out of Valtrex.  She has an appointment scheduled with her OB/GYN in 2 days on 11/26/2022.  Afebrile and vital signs are stable.  Prescription for 3-day course of Valtrex provided.  Instructed patient to follow-up with her OB/GYN as scheduled.  Education provided on pregnancy and herpes.  She agrees to plan of care.  Final Clinical Impressions(s) / UC Diagnoses   Final diagnoses:  [redacted] weeks gestation of pregnancy  HSV-2 infection complicating pregnancy, second trimester     Discharge Instructions      Take the Valtrex as directed.  Follow up with your OB/GYN.      ED Prescriptions     Medication Sig Dispense Auth. Provider   valACYclovir (VALTREX) 500 MG tablet Take 1 tablet (500 mg total) by mouth 2 (two) times daily for 3 days. 6 tablet Mickie Bail, NP      PDMP not reviewed this encounter.   Mickie Bail, NP 11/24/22 1325

## 2023-01-14 DIAGNOSIS — O365990 Maternal care for other known or suspected poor fetal growth, unspecified trimester, not applicable or unspecified: Secondary | ICD-10-CM | POA: Diagnosis not present

## 2023-01-14 DIAGNOSIS — O26873 Cervical shortening, third trimester: Secondary | ICD-10-CM | POA: Diagnosis not present

## 2023-01-14 DIAGNOSIS — Z3689 Encounter for other specified antenatal screening: Secondary | ICD-10-CM | POA: Diagnosis not present

## 2023-01-14 DIAGNOSIS — O365930 Maternal care for other known or suspected poor fetal growth, third trimester, not applicable or unspecified: Secondary | ICD-10-CM | POA: Diagnosis not present

## 2023-01-14 DIAGNOSIS — Z3A34 34 weeks gestation of pregnancy: Secondary | ICD-10-CM | POA: Diagnosis not present

## 2023-01-14 DIAGNOSIS — O26879 Cervical shortening, unspecified trimester: Secondary | ICD-10-CM | POA: Diagnosis not present

## 2023-01-21 DIAGNOSIS — I615 Nontraumatic intracerebral hemorrhage, intraventricular: Secondary | ICD-10-CM | POA: Diagnosis not present

## 2023-01-21 DIAGNOSIS — O26873 Cervical shortening, third trimester: Secondary | ICD-10-CM | POA: Diagnosis not present

## 2023-01-21 DIAGNOSIS — O26893 Other specified pregnancy related conditions, third trimester: Secondary | ICD-10-CM | POA: Diagnosis not present

## 2023-01-21 DIAGNOSIS — Z3689 Encounter for other specified antenatal screening: Secondary | ICD-10-CM | POA: Diagnosis not present

## 2023-01-21 DIAGNOSIS — Z3A35 35 weeks gestation of pregnancy: Secondary | ICD-10-CM | POA: Diagnosis not present

## 2023-01-21 DIAGNOSIS — O365930 Maternal care for other known or suspected poor fetal growth, third trimester, not applicable or unspecified: Secondary | ICD-10-CM | POA: Diagnosis not present

## 2023-01-21 DIAGNOSIS — O365990 Maternal care for other known or suspected poor fetal growth, unspecified trimester, not applicable or unspecified: Secondary | ICD-10-CM | POA: Diagnosis not present

## 2023-01-21 DIAGNOSIS — Z6791 Unspecified blood type, Rh negative: Secondary | ICD-10-CM | POA: Diagnosis not present

## 2023-01-21 DIAGNOSIS — B009 Herpesviral infection, unspecified: Secondary | ICD-10-CM | POA: Diagnosis not present

## 2023-01-21 DIAGNOSIS — O26879 Cervical shortening, unspecified trimester: Secondary | ICD-10-CM | POA: Diagnosis not present

## 2023-01-28 DIAGNOSIS — Z3689 Encounter for other specified antenatal screening: Secondary | ICD-10-CM | POA: Diagnosis not present

## 2023-01-28 DIAGNOSIS — O365930 Maternal care for other known or suspected poor fetal growth, third trimester, not applicable or unspecified: Secondary | ICD-10-CM | POA: Diagnosis not present

## 2023-01-28 DIAGNOSIS — O365990 Maternal care for other known or suspected poor fetal growth, unspecified trimester, not applicable or unspecified: Secondary | ICD-10-CM | POA: Diagnosis not present

## 2023-01-28 DIAGNOSIS — Z3A36 36 weeks gestation of pregnancy: Secondary | ICD-10-CM | POA: Diagnosis not present

## 2023-02-04 DIAGNOSIS — O26893 Other specified pregnancy related conditions, third trimester: Secondary | ICD-10-CM | POA: Diagnosis not present

## 2023-02-04 DIAGNOSIS — Z3689 Encounter for other specified antenatal screening: Secondary | ICD-10-CM | POA: Diagnosis not present

## 2023-02-04 DIAGNOSIS — O26879 Cervical shortening, unspecified trimester: Secondary | ICD-10-CM | POA: Diagnosis not present

## 2023-02-04 DIAGNOSIS — O365930 Maternal care for other known or suspected poor fetal growth, third trimester, not applicable or unspecified: Secondary | ICD-10-CM | POA: Diagnosis not present

## 2023-02-04 DIAGNOSIS — O26873 Cervical shortening, third trimester: Secondary | ICD-10-CM | POA: Diagnosis not present

## 2023-02-04 DIAGNOSIS — O365990 Maternal care for other known or suspected poor fetal growth, unspecified trimester, not applicable or unspecified: Secondary | ICD-10-CM | POA: Diagnosis not present

## 2023-02-04 DIAGNOSIS — O26899 Other specified pregnancy related conditions, unspecified trimester: Secondary | ICD-10-CM | POA: Diagnosis not present

## 2023-02-04 DIAGNOSIS — Z3A37 37 weeks gestation of pregnancy: Secondary | ICD-10-CM | POA: Diagnosis not present

## 2023-02-04 DIAGNOSIS — I615 Nontraumatic intracerebral hemorrhage, intraventricular: Secondary | ICD-10-CM | POA: Diagnosis not present

## 2023-02-04 DIAGNOSIS — B009 Herpesviral infection, unspecified: Secondary | ICD-10-CM | POA: Diagnosis not present

## 2023-02-07 DIAGNOSIS — Z3A37 37 weeks gestation of pregnancy: Secondary | ICD-10-CM | POA: Diagnosis not present

## 2023-02-07 DIAGNOSIS — Z3689 Encounter for other specified antenatal screening: Secondary | ICD-10-CM | POA: Diagnosis not present

## 2023-02-11 DIAGNOSIS — O365930 Maternal care for other known or suspected poor fetal growth, third trimester, not applicable or unspecified: Secondary | ICD-10-CM | POA: Diagnosis not present

## 2023-02-11 DIAGNOSIS — Z3689 Encounter for other specified antenatal screening: Secondary | ICD-10-CM | POA: Diagnosis not present

## 2023-02-11 DIAGNOSIS — Z3A38 38 weeks gestation of pregnancy: Secondary | ICD-10-CM | POA: Diagnosis not present

## 2023-02-11 DIAGNOSIS — O365990 Maternal care for other known or suspected poor fetal growth, unspecified trimester, not applicable or unspecified: Secondary | ICD-10-CM | POA: Diagnosis not present

## 2023-02-20 DIAGNOSIS — O26873 Cervical shortening, third trimester: Secondary | ICD-10-CM | POA: Diagnosis not present

## 2023-02-20 DIAGNOSIS — O9832 Other infections with a predominantly sexual mode of transmission complicating childbirth: Secondary | ICD-10-CM | POA: Diagnosis not present

## 2023-02-20 DIAGNOSIS — O321XX0 Maternal care for breech presentation, not applicable or unspecified: Secondary | ICD-10-CM | POA: Diagnosis not present

## 2023-02-20 DIAGNOSIS — Z6721 Type B blood, Rh negative: Secondary | ICD-10-CM | POA: Diagnosis not present

## 2023-02-20 DIAGNOSIS — O9902 Anemia complicating childbirth: Secondary | ICD-10-CM | POA: Diagnosis not present

## 2023-02-20 DIAGNOSIS — O26893 Other specified pregnancy related conditions, third trimester: Secondary | ICD-10-CM | POA: Diagnosis not present

## 2023-02-20 DIAGNOSIS — Z3A39 39 weeks gestation of pregnancy: Secondary | ICD-10-CM | POA: Diagnosis not present

## 2023-02-20 DIAGNOSIS — D62 Acute posthemorrhagic anemia: Secondary | ICD-10-CM | POA: Diagnosis not present

## 2023-11-04 ENCOUNTER — Ambulatory Visit (HOSPITAL_COMMUNITY): Admission: EM | Admit: 2023-11-04 | Discharge: 2023-11-04 | Disposition: A | Discharge status: Home / Self Care

## 2023-11-04 ENCOUNTER — Encounter (HOSPITAL_COMMUNITY): Payer: Self-pay

## 2023-11-04 DIAGNOSIS — B009 Herpesviral infection, unspecified: Secondary | ICD-10-CM

## 2023-11-04 DIAGNOSIS — N898 Other specified noninflammatory disorders of vagina: Secondary | ICD-10-CM

## 2023-11-04 MED ORDER — VALACYCLOVIR HCL 500 MG PO TABS: 500.0000 mg | ORAL_TABLET | Freq: Two times a day (BID) | ORAL | 1 refills | Status: AC

## 2023-11-04 NOTE — ED Provider Notes (Signed)
 [facility]    CSN: [id number] Arrival date & time: 11/04/23  1409      History   Chief Complaint Chief Complaint  Patient presents with   Vaginal Lesions     HPI Jane Martin is a 28 y.o. female.   Patient presents today with a 4-day history of vaginal lesions.  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  She reports current symptoms are identical to previous episodes of this condition.  She has not been taking any over-the-counter medication for symptom management.  She has previously taken Valtrex  episodically with outbreaks and is requesting a refill of this medication.  She has no concern for additional STI and declined testing or pelvic exam.  She is confident that symptoms are related to HSV.  She has no concern for pregnancy.  She is not currently taking suppressive antiviral therapy.    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   Encephalopathy acute    ICH (intracerebral hemorrhage) (HCC) 10/17/2020    Past Surgical History:  Procedure Laterality Date   CRANIOTOMY Left 10/17/2020   Procedure: CRANIOTOMY HEMATOMA Evacuation;  Surgeon: Elna Haggis, MD;  Location: [facility];  Service: Neurosurgery;  Laterality: Left;   IR ANGIO INTRA EXTRACRAN SEL COM CAROTID INNOMINATE BILAT MOD SED  10/29/2020   IR ANGIO VERTEBRAL SEL VERTEBRAL BILAT MOD SED  10/29/2020   IR US  GUIDE VASC ACCESS RIGHT  10/29/2020    OB History     Gravida  1   Para      Term      Preterm      AB      Living         SAB      IAB      Ectopic      Multiple      Live Births               Home Medications    Prior to  Admission medications   Medication Sig Start Date End Date Taking? Authorizing Provider  valACYclovir  (VALTREX ) 500 MG tablet Take 1 tablet (500 mg total) by mouth 2 (two) times daily for 3 days. 11/04/23 11/07/23  Eisen Robenson, Betsey Brow, PA-C    Family History Family History  Problem Relation Age of Onset   Hypertension Mother    Intracerebral hemorrhage Mother        due to HTN   Healthy Father     Social History Social History   Tobacco Use   Smoking status: Never   Smokeless tobacco: Never  Vaping Use   Vaping status: Never Used  Substance Use Topics   Alcohol use: Yes   Drug use: Yes    Types: Marijuana     Allergies   Flagyl  [metronidazole ]   Review of Systems Review of Systems  Constitutional:  Negative for activity change, appetite change, fatigue and fever.  Gastrointestinal:  Negative for abdominal pain, diarrhea, nausea and vomiting.  Genitourinary:  Positive for genital sores. Negative for dysuria, frequency, pelvic pain, urgency, vaginal bleeding, vaginal discharge and vaginal pain.     Physical Exam Triage Vital Signs ED  Triage Vitals  Encounter Vitals Group     BP 11/04/23 1443 116/81     Systolic BP Percentile --      Diastolic BP Percentile --      Pulse Rate 11/04/23 1443 62     Resp 11/04/23 1443 16     Temp 11/04/23 1443 98.2 F (36.8 C)     Temp Source 11/04/23 1443 Oral     SpO2 11/04/23 1443 97 %     Weight --      Height --      Head Circumference --      Peak Flow --      Pain Score 11/04/23 1441 6     Pain Loc --      Pain Education --      Exclude from Growth Chart --    No data found.  Updated Vital Signs BP 116/81 (BP Location: Right Arm)   Pulse 62   Temp 98.2 F (36.8 C) (Oral)   Resp 16   LMP 10/13/2023 (Approximate)   SpO2 97%   Breastfeeding No   Visual Acuity Right Eye Distance:   Left Eye Distance:   Bilateral Distance:    Right Eye Near:   Left Eye Near:    Bilateral Near:     Physical Exam Vitals reviewed.   Constitutional:      General: She is awake. She is not in acute distress.    Appearance: Normal appearance. She is well-developed. She is not ill-appearing.     Comments: Very pleasant female appears stated age in no acute distress sitting comfortably in exam room  HENT:     Head: Normocephalic and atraumatic.  Cardiovascular:     Rate and Rhythm: Normal rate and regular rhythm.     Heart sounds: Normal heart sounds, S1 normal and S2 normal. No murmur heard. Pulmonary:     Effort: Pulmonary effort is normal.     Breath sounds: Normal breath sounds. No wheezing, rhonchi or rales.     Comments: Clear to auscultation bilaterally Abdominal:     General: Bowel sounds are normal.     Palpations: Abdomen is soft.     Tenderness: There is no abdominal tenderness. There is no right CVA tenderness, left CVA tenderness, guarding or rebound.  Genitourinary:    Comments: Patient declined exam Psychiatric:        Behavior: Behavior is cooperative.      UC Treatments / Results  Labs (all labs ordered are listed, but only abnormal results are displayed) Labs Reviewed - No data to display  EKG   Radiology No results found.  Procedures Procedures (including critical care time)  Medications Ordered in UC Medications - No data to display  Initial Impression / Assessment and Plan / UC Course  I have reviewed the triage vital signs and the nursing notes.  Pertinent labs & imaging results that were available during my care of the patient were reviewed by me and considered in my medical decision making (see chart for details).     Patient is well-appearing, afebrile, nontoxic, nontachycardic.  She reports symptoms are consistent with previous HSV episodes and declined exam or additional STI testing.  Will empirically treat for HSV outbreak with valacyclovir  500 mg twice daily for 3 days.  She was provided a refill of this medication in case her symptoms persist or recur.  We did discuss  potential utility of suppressive therapy if she has frequent outbreaks but currently she is experiencing an  outbreak every few months and so does not believe this is necessary.  We discussed that if anything changes or worsens she should return for reevaluation.  Strict return precautions given.  All questions answered to patient satisfaction.  Final Clinical Impressions(s) / UC Diagnoses   Final diagnoses:  HSV (herpes simplex virus) infection  Vaginal lesion     Discharge Instructions      Start Valtrex  twice daily for 3 days.  I have called in a refill in case you have a recurrent episode.  As we discussed, if you have frequent outbreaks it may be worthwhile to start this medication every day to try to prevent the outbreaks.  If anything changes or if your symptoms are not improving please return for reevaluation.    ED Prescriptions     Medication Sig Dispense Auth. Provider   valACYclovir  (VALTREX ) 500 MG tablet Take 1 tablet (500 mg total) by mouth 2 (two) times daily for 3 days. 6 tablet Rondel Episcopo K, PA-C      PDMP not reviewed this encounter.   Budd Cargo, PA-C 11/04/23 1518

## 2023-11-04 NOTE — Discharge Instructions (Signed)
 Start Valtrex  twice daily for 3 days.  I have called in a refill in case you have a recurrent episode.  As we discussed, if you have frequent outbreaks it may be worthwhile to start this medication every day to try to prevent the outbreaks.  If anything changes or if your symptoms are not improving please return for reevaluation.

## 2023-11-04 NOTE — ED Triage Notes (Signed)
 Patient presenting with vaginal lesions onset 1 week ago. Patient has a history of outbreaks of HSV.  Prescriptions or OTC medications tried: No .

## 2023-12-10 ENCOUNTER — Ambulatory Visit (HOSPITAL_COMMUNITY)
Admission: EM | Admit: 2023-12-10 | Discharge: 2023-12-10 | Disposition: A | Discharge status: Home / Self Care | Attending: Family Medicine | Admitting: Family Medicine

## 2023-12-10 ENCOUNTER — Encounter (HOSPITAL_COMMUNITY): Payer: Self-pay

## 2023-12-10 DIAGNOSIS — B009 Herpesviral infection, unspecified: Secondary | ICD-10-CM | POA: Diagnosis not present

## 2023-12-10 HISTORY — DX: Herpesviral infection, unspecified: B00.9

## 2023-12-10 MED ORDER — VALACYCLOVIR HCL 500 MG PO TABS: 500.0000 mg | ORAL_TABLET | Freq: Two times a day (BID) | ORAL | 0 refills | Status: AC

## 2023-12-10 MED ORDER — VALACYCLOVIR HCL 1 G PO TABS: 1000.0000 mg | ORAL_TABLET | Freq: Every day | ORAL | 1 refills | Status: DC

## 2023-12-10 NOTE — ED Triage Notes (Signed)
 Patient presenting with vaginal discomfort and lesions onset 3 days ago. Patient states she is currently having an outbreak and is out of her medication.   Prescriptions or OTC medications tried: No

## 2023-12-10 NOTE — Discharge Instructions (Signed)
 Take valacyclovir  500 mg--1 tablet 2 times daily for 3 days  Then when you are done with that prescription, then start taking valacyclovir  1000 mg--1 daily to suppress the herpes infection and to lower your risk of outbreaks.  Keep your follow-up with your new primary care

## 2023-12-10 NOTE — ED Provider Notes (Signed)
 [facility]    CSN: [id number] Arrival date & time: 12/10/23  1001      History   Chief Complaint Chief Complaint  Patient presents with   Vaginal Pain    HPI Jane Martin is a 28 y.o. female.    Vaginal Pain  Here for vaginal pain and irritation and lesions, c/w a herpes outbreak. She began feeling a little irritation 3 days ago but now she is certain that it is herpes.  She last had an outbreak about 1 month ago and was treated here.  Staff at that point helped her get a new patient appointment with internal medicine, but that is not going to be until July 21.  Last menstrual cycle was June 6.  She is allergic to Flagyl   She is confident that there are no other symptoms that would be consistent with any other STD. 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   Encephalopathy acute    ICH (intracerebral hemorrhage) (HCC) 10/17/2020    Past Surgical History:  Procedure Laterality Date   CRANIOTOMY Left 10/17/2020   Procedure: CRANIOTOMY HEMATOMA Evacuation;  Surgeon: Elna Haggis, MD;  Location: [facility];  Service: Neurosurgery;  Laterality: Left;   IR ANGIO INTRA EXTRACRAN SEL COM CAROTID INNOMINATE BILAT MOD SED  10/29/2020   IR ANGIO VERTEBRAL SEL VERTEBRAL BILAT MOD SED  10/29/2020   IR US  GUIDE VASC ACCESS RIGHT  10/29/2020    OB History     Gravida  1   Para      Term      Preterm      AB      Living         SAB      IAB      Ectopic      Multiple      Live Births               Home Medications    Prior to Admission medications   Medication Sig Start Date End Date Taking? Authorizing Provider   valACYclovir  (VALTREX ) 1000 MG tablet Take 1 tablet (1,000 mg total) by mouth daily. For suppression 12/10/23  Yes Shelvie Salsberry, Paige Boatman, MD  valACYclovir  (VALTREX ) 500 MG tablet Take 1 tablet (500 mg total) by mouth 2 (two) times daily for 3 days. 12/10/23 12/13/23 Yes Darielys Giglia, Paige Boatman, MD    Family History Family History  Problem Relation Age of Onset   Hypertension Mother    Intracerebral hemorrhage Mother        due to HTN   Healthy Father     Social History Social History   Tobacco Use   Smoking status: Never   Smokeless tobacco: Never  Vaping Use   Vaping status: Never Used  Substance Use Topics   Alcohol use: Yes   Drug use: Yes    Types: Marijuana     Allergies   Flagyl  [metronidazole ]   Review of Systems Review of Systems  Genitourinary:  Positive for vaginal pain.     Physical Exam Triage Vital Signs ED Triage Vitals  Encounter Vitals Group     BP 12/10/23 1014 107/73  Systolic BP Percentile --      Diastolic BP Percentile --      Pulse Rate 12/10/23 1014 72     Resp 12/10/23 1014 16     Temp 12/10/23 1014 97.8 F (36.6 C)     Temp Source 12/10/23 1014 Oral     SpO2 12/10/23 1014 97 %     Weight 12/10/23 1013 160 lb (72.6 kg)     Height 12/10/23 1013 5' 3.5" (1.613 m)     Head Circumference --      Peak Flow --      Pain Score 12/10/23 1012 0     Pain Loc --      Pain Education --      Exclude from Growth Chart --    No data found.  Updated Vital Signs BP 107/73 (BP Location: Right Arm)   Pulse 72   Temp 97.8 F (36.6 C) (Oral)   Resp 16   Ht 5' 3.5" (1.613 m)   Wt 72.6 kg   LMP 12/09/2023 (Exact Date)   SpO2 97%   BMI 27.90 kg/m   Visual Acuity Right Eye Distance:   Left Eye Distance:   Bilateral Distance:    Right Eye Near:   Left Eye Near:    Bilateral Near:     Physical Exam Vitals reviewed.  Constitutional:      General: She is not in acute distress.    Appearance: She is not toxic-appearing.  Skin:    Coloration:  Skin is not pale.  Neurological:     General: No focal deficit present.     Mental Status: She is alert and oriented to person, place, and time.  Psychiatric:        Behavior: Behavior normal.      UC Treatments / Results  Labs (all labs ordered are listed, but only abnormal results are displayed) Labs Reviewed - No data to display  EKG   Radiology No results found.  Procedures Procedures (including critical care time)  Medications Ordered in UC Medications - No data to display  Initial Impression / Assessment and Plan / UC Course  I have reviewed the triage vital signs and the nursing notes.  Pertinent labs & imaging results that were available during my care of the patient were reviewed by me and considered in my medical decision making (see chart for details).   Valtrex  500 mg twice daily is sent into the pharmacy to treat this outbreak.  I have printed her prescription for Valtrex  once daily 1000 mg, to take for suppression since this has been so frequent.  It will be a month and a half or more before she gets to see her new primary care, so that prescription has next refill on it. Final Clinical Impressions(s) / UC Diagnoses   Final diagnoses:  Herpes     Discharge Instructions      Take valacyclovir  500 mg--1 tablet 2 times daily for 3 days  Then when you are done with that prescription, then start taking valacyclovir  1000 mg--1 daily to suppress the herpes infection and to lower your risk of outbreaks.  Keep your follow-up with your new primary care    ED Prescriptions     Medication Sig Dispense Auth. Provider   valACYclovir  (VALTREX ) 500 MG tablet Take 1 tablet (500 mg total) by mouth 2 (two) times daily for 3 days. 6 tablet Ann Keto, MD   valACYclovir  (VALTREX ) 1000 MG tablet Take 1 tablet (1,000  mg total) by mouth daily. For suppression 30 tablet Zaidin Blyden K, MD      PDMP not reviewed this encounter.   Ann Keto,  MD 12/10/23 1031

## 2023-12-30 ENCOUNTER — Encounter (HOSPITAL_COMMUNITY): Payer: Self-pay | Admitting: Interventional Radiology

## 2024-01-23 ENCOUNTER — Ambulatory Visit: Admitting: Nurse Practitioner

## 2024-02-10 ENCOUNTER — Encounter (HOSPITAL_COMMUNITY): Payer: Self-pay

## 2024-02-10 ENCOUNTER — Ambulatory Visit (HOSPITAL_COMMUNITY)
Admission: EM | Admit: 2024-02-10 | Discharge: 2024-02-10 | Disposition: A | Discharge status: Home / Self Care | Attending: Physician Assistant | Admitting: Physician Assistant

## 2024-02-10 DIAGNOSIS — B009 Herpesviral infection, unspecified: Secondary | ICD-10-CM

## 2024-02-10 DIAGNOSIS — Z760 Encounter for issue of repeat prescription: Secondary | ICD-10-CM

## 2024-02-10 MED ORDER — VALACYCLOVIR HCL 500 MG PO TABS: 500.0000 mg | ORAL_TABLET | Freq: Every day | ORAL | 1 refills | Status: DC

## 2024-02-10 MED ORDER — VALACYCLOVIR HCL 500 MG PO TABS: 500.0000 mg | ORAL_TABLET | Freq: Two times a day (BID) | ORAL | 0 refills | Status: DC

## 2024-02-10 NOTE — ED Triage Notes (Signed)
 Patient is here for med refill on valtrex . Patient states she is having a outbreak at this time.

## 2024-02-10 NOTE — ED Provider Notes (Signed)
 [facility]    CSN: [id number] Arrival date & time: 02/10/24  9178      History   Chief Complaint Chief Complaint  Patient presents with   Medication Refill    HPI KHOLE ARTERBURN is a 28 y.o. female.   Patient presents today requesting medication refill.  She has a history of recurrent HSV infection that is typically triggered by her menstrual cycle.  Current episode began a few days ago after her cycle finished.  She has been seen by our clinic and reports that she does well with the Valtrex  but is frustrated that she continues to have to come back.  She is not currently established with a primary care but is open to finding someone if we can assist with this.  She has not been taking anything over-the-counter for her symptoms.  She denies any additional symptoms including pelvic pain, vaginal discharge, fever, nausea/vomiting, abdominal pain.  She is confident that she is not pregnant.  She was taking Valtrex  for suppression but ran out of this medication as we only gave her 1 refill.  She has no additional complaints or concerns today.    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   Encephalopathy acute    ICH (intracerebral hemorrhage) (HCC) 10/17/2020    Past Surgical History:  Procedure Laterality Date   CRANIOTOMY Left 10/17/2020   Procedure: CRANIOTOMY HEMATOMA Evacuation;  Surgeon: Colon Shove, MD;  Location: [facility];  Service: Neurosurgery;  Laterality: Left;   IR ANGIO INTRA EXTRACRAN SEL COM CAROTID INNOMINATE BILAT MOD SED  10/29/2020   IR ANGIO VERTEBRAL SEL VERTEBRAL BILAT MOD SED  10/29/2020   IR US  GUIDE VASC ACCESS RIGHT   10/29/2020    OB History     Gravida  1   Para      Term      Preterm      AB      Living         SAB      IAB      Ectopic      Multiple      Live Births               Home Medications    Prior to Admission medications   Medication Sig Start Date End Date Taking? Authorizing Provider  valACYclovir  (VALTREX ) 500 MG tablet Take 1 tablet (500 mg total) by mouth 2 (two) times daily. 02/10/24  Yes Corran Lalone K, PA-C  valACYclovir  (VALTREX ) 500 MG tablet Take 1 tablet (500 mg total) by mouth daily. 02/10/24  Yes Maddie Brazier, Rocky POUR, PA-C    Family History Family History  Problem Relation Age of Onset   Hypertension Mother    Intracerebral hemorrhage Mother        due to HTN   Healthy Father     Social History Social History   Tobacco Use   Smoking status: Never   Smokeless tobacco: Never  Vaping Use   Vaping status: Never Used  Substance Use Topics   Alcohol use: Yes   Drug use: Yes    Types: Marijuana     Allergies  Flagyl  [metronidazole ]   Review of Systems Review of Systems  Constitutional:  Negative for activity change, appetite change, fatigue and fever.  Gastrointestinal:  Negative for abdominal pain, diarrhea, nausea and vomiting.  Genitourinary:  Positive for genital sores. Negative for dysuria, frequency, pelvic pain, urgency, vaginal bleeding, vaginal discharge and vaginal pain.     Physical Exam Triage Vital Signs ED Triage Vitals [02/10/24 0834]  Encounter Vitals Group     BP 114/75     Girls Systolic BP Percentile      Girls Diastolic BP Percentile      Boys Systolic BP Percentile      Boys Diastolic BP Percentile      Pulse Rate 87     Resp 20     Temp 98 F (36.7 C)     Temp Source Oral     SpO2 95 %     Weight      Height      Head Circumference      Peak Flow      Pain Score      Pain Loc      Pain Education      Exclude from Growth Chart    No data found.  Updated Vital Signs BP 114/75 (BP Location: Left  Arm)   Pulse 87   Temp 98 F (36.7 C) (Oral)   Resp 20   LMP 01/30/2024 (Approximate)   SpO2 95%   Visual Acuity Right Eye Distance:   Left Eye Distance:   Bilateral Distance:    Right Eye Near:   Left Eye Near:    Bilateral Near:     Physical Exam Vitals reviewed.  Constitutional:      General: She is awake. She is not in acute distress.    Appearance: Normal appearance. She is well-developed. She is not ill-appearing.     Comments: Appears stated age in no acute distress sitting comfortably in exam room  HENT:     Head: Normocephalic and atraumatic.  Cardiovascular:     Rate and Rhythm: Normal rate and regular rhythm.     Heart sounds: Normal heart sounds, S1 normal and S2 normal. No murmur heard. Pulmonary:     Effort: Pulmonary effort is normal.     Breath sounds: Normal breath sounds. No wheezing, rhonchi or rales.     Comments: Clear to auscultation bilaterally Abdominal:     General: Bowel sounds are normal.     Palpations: Abdomen is soft.     Tenderness: There is no abdominal tenderness. There is no right CVA tenderness, left CVA tenderness, guarding or rebound.     Comments: Benign abdominal exam  Genitourinary:    Comments: Exam declined by patient Psychiatric:        Behavior: Behavior is cooperative.      UC Treatments / Results  Labs (all labs ordered are listed, but only abnormal results are displayed) Labs Reviewed - No data to display  EKG   Radiology No results found.  Procedures Procedures (including critical care time)  Medications Ordered in UC Medications - No data to display  Initial Impression / Assessment and Plan / UC Course  I have reviewed the triage vital signs and the nursing notes.  Pertinent labs & imaging results that were available during my care of the patient were reviewed by me and considered in my medical decision making (see chart for details).     Patient is well-appearing, afebrile, nontoxic, nontachycardic.   She was frustrated  because she had to wait in the back for a provider and was tired of having to come to our clinic frequently to have this medication refilled.  Discussed that we typically do not provide long-term medications and so she should follow-up with a primary care.  She was established with her primary care with an appointment scheduled before discharge.  I offered pelvic exam but she declined this.  Reports that her symptoms are consistent with previous episodes of HSV and she does not believe an exam is necessary.  She was provided Valtrex  500 mg twice daily for 3 days to treat active outbreak and then will start Valtrex  500 mg daily as suppressive therapy given her recurrent symptoms.  She was provided 1 month and 1 refill but discussed that she would need to follow-up with primary care in order to continue this medication.  Notification for dose adjustment she denies any history of chronic kidney disease and is under 66 years old with her last metabolic panel from 11/03/2020 showing normal kidney function.  Discussed that if she has any worsening or changing symptoms she needs to be seen immediately.  Strict return precautions given.  All questions answered to patient's satisfaction.  Final Clinical Impressions(s) / UC Diagnoses   Final diagnoses:  HSV infection  Encounter for medication refill     Discharge Instructions      We are treating you for a herpes outbreak.  Start valacyclovir  500 mg twice daily for 3 days.  After you finish this medication you can start taking valacyclovir  500 mg daily to try to prevent recurrence.  Please follow-up with primary care as scheduled.  If your symptoms are not going away with this medication regimen or if anything changes and worsens you need to be seen immediately.    ED Prescriptions     Medication Sig Dispense Auth. Provider   valACYclovir  (VALTREX ) 500 MG tablet Take 1 tablet (500 mg total) by mouth 2 (two) times daily. 6 tablet Lazette Estala,  Dylann Layne K, PA-C   valACYclovir  (VALTREX ) 500 MG tablet Take 1 tablet (500 mg total) by mouth daily. 30 tablet Braxden Lovering K, PA-C      PDMP not reviewed this encounter.   Sherrell Rocky POUR, PA-C 02/10/24 [phone]

## 2024-02-10 NOTE — Discharge Instructions (Signed)
 We are treating you for a herpes outbreak.  Start valacyclovir  500 mg twice daily for 3 days.  After you finish this medication you can start taking valacyclovir  500 mg daily to try to prevent recurrence.  Please follow-up with primary care as scheduled.  If your symptoms are not going away with this medication regimen or if anything changes and worsens you need to be seen immediately.

## 2024-02-13 ENCOUNTER — Ambulatory Visit (HOSPITAL_BASED_OUTPATIENT_CLINIC_OR_DEPARTMENT_OTHER): Admitting: Family Medicine

## 2024-04-07 ENCOUNTER — Telehealth: Admitting: Nurse Practitioner

## 2024-04-07 DIAGNOSIS — A6004 Herpesviral vulvovaginitis: Secondary | ICD-10-CM

## 2024-04-07 MED ORDER — VALACYCLOVIR HCL 500 MG PO TABS
500.0000 mg | ORAL_TABLET | Freq: Two times a day (BID) | ORAL | 0 refills | Status: AC
Start: 2024-04-07 — End: 2024-04-10

## 2024-04-07 NOTE — Progress Notes (Signed)
 E-Visit for Herpes Simplex   You will need to find a Primary care to get a prescription for suppressive therapy.  I have sent a supply for 3 days   We are sorry that you are not feeling well.  Here is how we plan to help!  Based on what you have shared ith me, it looks like you may be having an outbreak/flare-up of genital herpes.    I have prescribed I have prescribed Valacyclovir  500 mg Take one by mouth twice a day for 3 days.    If you have been prescribed long term medications to be taken on a regular basis, it is important to follow the recommendations and take them as ordered.    Outbreaks usually include blisters and open sores in the genital area. Outbreaks that happen after the first time are usually not as severe and do not last as long. Genital Herpes Simplex is a commonly sexually transmitted viral infection that is found worldwide. Most of these genital infections are caused by one or two herpes simplex viruses that is passed from person to person during vaginal, oral, or anal sex. Sometimes, people do not know they have herpes because they do not have any symptoms.  Please be aware that if you have genital herpes you can be contagious even when you are not having rash or flare-up and you may not have any symptoms, even when you are taking suppressive medicines.  Herpes cannot be cured. The disease usually causes most problems during the first few years. After that, the virus is still there, but it causes few to no symptoms. Even when the virus is active, people with herpes can take medicines to reduce and help prevent symptoms.  Herpes is an infection that can cause blisters and open sores on the genital area. Herpes is caused by a virus that is passed from person to person during vaginal, oral, or anal sex. Sometimes, people do not know they have herpes because they do not have any symptoms. Herpes cannot be cured. The disease usually causes most problems during the first few years.  After that, the virus is still there, but it causes few to no symptoms. Even when the virus is active, people with herpes can take medicines to reduce and help prevent symptoms.  If you have been prescribed medications to be taken on a regular basis, it is important to follow the recommendations and take them as ordered.  Some people with herpes never have any symptoms. But other people can develop symptoms within a few weeks of being infected with the herpes virus   Symptoms usually include blisters in the genital area. In women, this area includes the vagina, buttocks, anus, or thighs. In men, this area includes the penis, scrotum, anus, butt, or thighs. The blisters can become painful open sores, which then crust over as they heal. Sometimes, people can have other symptoms that include:  ?Blisters on the mouth or lips ?Fever, headache, or pain in the joints ?Trouble urinating  Outbreaks might occur every month or more often, or just once or twice a year. Sometimes, people can tell when an outbreak will occur, because they feel itching or pain beforehand. Sometimes they do not know that an outbreak is coming because they have no symptoms. Whatever your pattern is, keep in mind that herpes outbreaks usually become less frequent over time as you get older. Certain things, called triggers, can make outbreaks more likely to occur. These include stress, sunlight, menstrual periods,or  getting sick.  Antiviral therapy can shorten the duration of symptoms and signs in primary infection, which, when untreated, can be associated with significant increase in the symptoms of the disease.  HOME CARE Use a portable bath (such as a Sitz bath) where you can sit in warm water  for about 20 minutes. Your bathtub could also work. Avoid bubble baths.  Keep the genital area clean and dry and avoid tight clothes.  Take over-the-counter pain medicine such as acetaminophen  (brand name: Tylenol ) or ibuprofen sample  brand names: Advil, Motrin). But avoid aspirin.  Only take medications as instructed by your medical team.  You are most likely to spread herpes to a sex partner when you have blisters and open sores on your body. But it's also possible to spread herpes to your partner when you do not have any symptoms. That is because herpes can be present on your body without causing any symptoms, like blisters or pain.  Telling your sex partner that you have herpes can be hard. But it can help protect them, since there are ways to lower the risk of spreading the infection.   Using a condom every time you have sex  Not having sex when you have symptoms  Not having oral sex if you have blisters or open sores (in the genital area or around your mouth)  MAKE SURE YOU   Understand these instructions. Do not have sex without using a condom until you have been seen by a doctor and as instructed by the provider If you are not better or improved within 7 days, you MUST have a follow up at your doctor or the health department for evaluation. There are other causes of rashes in the genital region.  Thank you for choosing an e-visit.  Your e-visit answers were reviewed by a board certified advanced clinical practitioner to complete your personal care plan. Depending upon the condition, your plan could have included both over the counter or prescription medications.  Please review your pharmacy choice. Make sure the pharmacy is open so you can pick up prescription now. If there is a problem, you may contact your provider through [employer] and have the prescription routed to another pharmacy.  Your safety is important to us . If you have drug allergies check your prescription carefully.   For the next 24 hours you can use MyChart to ask questions about today's visit, request a non-urgent call back, or ask for a work or school excuse. You will get an email in the next two days asking about your experience. I  hope that your e-visit has been valuable and will speed your recovery.

## 2024-04-07 NOTE — Progress Notes (Signed)
 I have spent 5 minutes in review of e-visit questionnaire, review and updating patient chart, medical decision making and response to patient.   Claiborne Rigg, NP

## 2024-04-09 ENCOUNTER — Ambulatory Visit (HOSPITAL_BASED_OUTPATIENT_CLINIC_OR_DEPARTMENT_OTHER): Admitting: Family Medicine

## 2024-04-27 ENCOUNTER — Telehealth: Admitting: Emergency Medicine

## 2024-04-27 DIAGNOSIS — A6004 Herpesviral vulvovaginitis: Secondary | ICD-10-CM

## 2024-04-27 MED ORDER — VALACYCLOVIR HCL 500 MG PO TABS: 500.0000 mg | ORAL_TABLET | Freq: Two times a day (BID) | ORAL | 1 refills | Status: AC

## 2024-04-27 NOTE — Progress Notes (Signed)
 E-Visit for Herpes Simplex  We are sorry that you are not feeling well.  Here is how we plan to help!  Based on what you have shared ith me, it looks like you may be having an outbreak/flare-up of genital herpes.    I have prescribed I have prescribed Valacyclovir  500 mg Take one by mouth twice a day for 3 days.    If you have been prescribed long term medications to be taken on a regular basis, it is important to follow the recommendations and take them as ordered.    Outbreaks usually include blisters and open sores in the genital area. Outbreaks that happen after the first time are usually not as severe and do not last as long. Genital Herpes Simplex is a commonly sexually transmitted viral infection that is found worldwide. Most of these genital infections are caused by one or two herpes simplex viruses that is passed from person to person during vaginal, oral, or anal sex. Sometimes, people do not know they have herpes because they do not have any symptoms.  Please be aware that if you have genital herpes you can be contagious even when you are not having rash or flare-up and you may not have any symptoms, even when you are taking suppressive medicines.  Herpes cannot be cured. The disease usually causes most problems during the first few years. After that, the virus is still there, but it causes few to no symptoms. Even when the virus is active, people with herpes can take medicines to reduce and help prevent symptoms.  Herpes is an infection that can cause blisters and open sores on the genital area. Herpes is caused by a virus that is passed from person to person during vaginal, oral, or anal sex. Sometimes, people do not know they have herpes because they do not have any symptoms. Herpes cannot be cured. The disease usually causes most problems during the first few years. After that, the virus is still there, but it causes few to no symptoms. Even when the virus is active, people with herpes  can take medicines to reduce and help prevent symptoms.  If you have been prescribed medications to be taken on a regular basis, it is important to follow the recommendations and take them as ordered.  Some people with herpes never have any symptoms. But other people can develop symptoms within a few weeks of being infected with the herpes virus   Symptoms usually include blisters in the genital area. In women, this area includes the vagina, buttocks, anus, or thighs. In men, this area includes the penis, scrotum, anus, butt, or thighs. The blisters can become painful open sores, which then crust over as they heal. Sometimes, people can have other symptoms that include:  ?Blisters on the mouth or lips ?Fever, headache, or pain in the joints ?Trouble urinating  Outbreaks might occur every month or more often, or just once or twice a year. Sometimes, people can tell when an outbreak will occur, because they feel itching or pain beforehand. Sometimes they do not know that an outbreak is coming because they have no symptoms. Whatever your pattern is, keep in mind that herpes outbreaks usually become less frequent over time as you get older. Certain things, called triggers, can make outbreaks more likely to occur. These include stress, sunlight, menstrual periods,or getting sick.  Antiviral therapy can shorten the duration of symptoms and signs in primary infection, which, when untreated, can be associated with significant increase in the  symptoms of the disease.  HOME CARE Use a portable bath (such as a Sitz bath) where you can sit in warm water  for about 20 minutes. Your bathtub could also work. Avoid bubble baths.  Keep the genital area clean and dry and avoid tight clothes.  Take over-the-counter pain medicine such as acetaminophen  (brand name: Tylenol ) or ibuprofen sample brand names: Advil, Motrin). But avoid aspirin.  Only take medications as instructed by your medical team.  You are  most likely to spread herpes to a sex partner when you have blisters and open sores on your body. But it's also possible to spread herpes to your partner when you do not have any symptoms. That is because herpes can be present on your body without causing any symptoms, like blisters or pain.  Telling your sex partner that you have herpes can be hard. But it can help protect them, since there are ways to lower the risk of spreading the infection.   Using a condom every time you have sex  Not having sex when you have symptoms  Not having oral sex if you have blisters or open sores (in the genital area or around your mouth)  MAKE SURE YOU   Understand these instructions. Do not have sex without using a condom until you have been seen by a doctor and as instructed by the provider If you are not better or improved within 7 days, you MUST have a follow up at your doctor or the health department for evaluation. There are other causes of rashes in the genital region.  Thank you for choosing an e-visit.  Your e-visit answers were reviewed by a board certified advanced clinical practitioner to complete your personal care plan. Depending upon the condition, your plan could have included both over the counter or prescription medications.  Please review your pharmacy choice. Make sure the pharmacy is open so you can pick up prescription now. If there is a problem, you may contact your provider through [employer] and have the prescription routed to another pharmacy.  Your safety is important to us . If you have drug allergies check your prescription carefully.   For the next 24 hours you can use MyChart to ask questions about today's visit, request a non-urgent call back, or ask for a work or school excuse. You will get an email in the next two days asking about your experience. I hope that your e-visit has been valuable and will speed your recovery.  I have spent 5 minutes in review of e-visit  questionnaire, review and updating patient chart, medical decision making and response to patient.   Jon Belt, PhD, FNP-BC

## 2024-05-30 ENCOUNTER — Telehealth: Admitting: Physician Assistant

## 2024-05-30 DIAGNOSIS — B3731 Acute candidiasis of vulva and vagina: Secondary | ICD-10-CM

## 2024-05-30 MED ORDER — FLUCONAZOLE 150 MG PO TABS
ORAL_TABLET | ORAL | 0 refills | Status: AC
Start: 2024-05-30 — End: ?

## 2024-05-30 NOTE — Progress Notes (Signed)

## 2024-07-22 ENCOUNTER — Telehealth: Admitting: Family

## 2024-07-22 DIAGNOSIS — A6004 Herpesviral vulvovaginitis: Secondary | ICD-10-CM

## 2024-07-22 MED ORDER — VALACYCLOVIR HCL 1 G PO TABS: 500.0000 mg | ORAL_TABLET | Freq: Two times a day (BID) | ORAL | 1 refills | Status: AC

## 2024-07-22 NOTE — Progress Notes (Signed)
 E-Visit for Herpes Simplex  We are sorry that you are not feeling well.  Here is how we plan to help!  Based on what you have shared ith me, it looks like you may be having an outbreak/flare-up of genital herpes.    I have prescribed I have prescribed Valacyclovir  500 mg. Take one by mouth twice daily. You need to follow up with your PCP for refills. I will send in a month supply.  If you have been prescribed long term medications to be taken on a regular basis, it is important to follow the recommendations and take them as ordered.    Outbreaks usually include blisters and open sores in the genital area. Outbreaks that happen after the first time are usually not as severe and do not last as long. Genital Herpes Simplex is a commonly sexually transmitted viral infection that is found worldwide. Most of these genital infections are caused by one or two herpes simplex viruses that is passed from person to person during vaginal, oral, or anal sex. Sometimes, people do not know they have herpes because they do not have any symptoms.  Please be aware that if you have genital herpes you can be contagious even when you are not having rash or flare-up and you may not have any symptoms, even when you are taking suppressive medicines.  Herpes cannot be cured. The disease usually causes most problems during the first few years. After that, the virus is still there, but it causes few to no symptoms. Even when the virus is active, people with herpes can take medicines to reduce and help prevent symptoms.  Herpes is an infection that can cause blisters and open sores on the genital area. Herpes is caused by a virus that is passed from person to person during vaginal, oral, or anal sex. Sometimes, people do not know they have herpes because they do not have any symptoms. Herpes cannot be cured. The disease usually causes most problems during the first few years. After that, the virus is still there, but it causes few  to no symptoms. Even when the virus is active, people with herpes can take medicines to reduce and help prevent symptoms.  If you have been prescribed medications to be taken on a regular basis, it is important to follow the recommendations and take them as ordered.  Some people with herpes never have any symptoms. But other people can develop symptoms within a few weeks of being infected with the herpes virus   Symptoms usually include blisters in the genital area. In women, this area includes the vagina, buttocks, anus, or thighs. In men, this area includes the penis, scrotum, anus, butt, or thighs. The blisters can become painful open sores, which then crust over as they heal. Sometimes, people can have other symptoms that include:  ?Blisters on the mouth or lips ?Fever, headache, or pain in the joints ?Trouble urinating  Outbreaks might occur every month or more often, or just once or twice a year. Sometimes, people can tell when an outbreak will occur, because they feel itching or pain beforehand. Sometimes they do not know that an outbreak is coming because they have no symptoms. Whatever your pattern is, keep in mind that herpes outbreaks usually become less frequent over time as you get older. Certain things, called triggers, can make outbreaks more likely to occur. These include stress, sunlight, menstrual periods,or getting sick.  Antiviral therapy can shorten the duration of symptoms and signs in primary infection,  which, when untreated, can be associated with significant increase in the symptoms of the disease.  HOME CARE Use a portable bath (such as a Sitz bath) where you can sit in warm water  for about 20 minutes. Your bathtub could also work. Avoid bubble baths.  Keep the genital area clean and dry and avoid tight clothes.  Take over-the-counter pain medicine such as acetaminophen  (brand name: Tylenol ) or ibuprofen sample brand names: Advil, Motrin). But avoid aspirin.  Only  take medications as instructed by your medical team.  You are most likely to spread herpes to a sex partner when you have blisters and open sores on your body. But it's also possible to spread herpes to your partner when you do not have any symptoms. That is because herpes can be present on your body without causing any symptoms, like blisters or pain.  Telling your sex partner that you have herpes can be hard. But it can help protect them, since there are ways to lower the risk of spreading the infection.   Using a condom every time you have sex  Not having sex when you have symptoms  Not having oral sex if you have blisters or open sores (in the genital area or around your mouth)  MAKE SURE YOU   Understand these instructions. Do not have sex without using a condom until you have been seen by a doctor and as instructed by the provider If you are not better or improved within 7 days, you MUST have a follow up at your doctor or the health department for evaluation. There are other causes of rashes in the genital region.  Thank you for choosing an e-visit.  Your e-visit answers were reviewed by a board certified advanced clinical practitioner to complete your personal care plan. Depending upon the condition, your plan could have included both over the counter or prescription medications.  Please review your pharmacy choice. Make sure the pharmacy is open so you can pick up prescription now. If there is a problem, you may contact your provider through [employer] and have the prescription routed to another pharmacy.  Your safety is important to us . If you have drug allergies check your prescription carefully.   For the next 24 hours you can use MyChart to ask questions about today's visit, request a non-urgent call back, or ask for a work or school excuse. You will get an email in the next two days asking about your experience. I hope that your e-visit has been valuable and will speed  your recovery.   Approximately 5 minutes was spent documenting and reviewing patient's chart.
